# Patient Record
Sex: Male | Born: 1940 | Race: Black or African American | Hispanic: No | Marital: Married | Smoking: Former smoker
Health system: Southern US, Community
[De-identification: ages and names within clinical notes are randomized; demographics above are authoritative.]

## PROBLEM LIST (undated history)

## (undated) DIAGNOSIS — I509 Heart failure, unspecified: Secondary | ICD-10-CM

## (undated) DIAGNOSIS — I2699 Other pulmonary embolism without acute cor pulmonale: Secondary | ICD-10-CM

## (undated) DIAGNOSIS — I2510 Atherosclerotic heart disease of native coronary artery without angina pectoris: Secondary | ICD-10-CM

## (undated) DIAGNOSIS — I10 Essential (primary) hypertension: Secondary | ICD-10-CM

## (undated) DIAGNOSIS — K219 Gastro-esophageal reflux disease without esophagitis: Secondary | ICD-10-CM

## (undated) DIAGNOSIS — E119 Type 2 diabetes mellitus without complications: Secondary | ICD-10-CM

## (undated) DIAGNOSIS — J449 Chronic obstructive pulmonary disease, unspecified: Secondary | ICD-10-CM

## (undated) DIAGNOSIS — C801 Malignant (primary) neoplasm, unspecified: Secondary | ICD-10-CM

## (undated) DIAGNOSIS — M797 Fibromyalgia: Secondary | ICD-10-CM

## (undated) HISTORY — PX: HERNIA REPAIR: SHX51

## (undated) HISTORY — PX: TONSILLECTOMY: SUR1361

## (undated) HISTORY — PX: REPAIR KNEE LIGAMENT: SUR1188

## (undated) HISTORY — PX: EYE SURGERY: SHX253

## (undated) SURGERY — BRONCHOSCOPY, FLEXIBLE
Anesthesia: Moderate Sedation | Laterality: Bilateral

---

## 2004-05-24 ENCOUNTER — Ambulatory Visit: Payer: Self-pay | Admitting: Specialist

## 2004-05-31 ENCOUNTER — Ambulatory Visit: Payer: Self-pay | Admitting: Specialist

## 2004-08-07 ENCOUNTER — Emergency Department: Payer: Self-pay | Admitting: Emergency Medicine

## 2004-09-17 ENCOUNTER — Emergency Department: Payer: Self-pay | Admitting: Unknown Physician Specialty

## 2004-09-19 ENCOUNTER — Emergency Department: Payer: Self-pay | Admitting: Emergency Medicine

## 2004-09-30 ENCOUNTER — Emergency Department: Payer: Self-pay | Admitting: Emergency Medicine

## 2004-10-23 ENCOUNTER — Emergency Department: Payer: Self-pay | Admitting: Emergency Medicine

## 2004-10-28 ENCOUNTER — Ambulatory Visit: Payer: Self-pay | Admitting: Unknown Physician Specialty

## 2005-01-14 ENCOUNTER — Emergency Department: Payer: Self-pay | Admitting: Emergency Medicine

## 2005-04-24 ENCOUNTER — Ambulatory Visit: Payer: Self-pay | Admitting: Family Medicine

## 2005-05-06 ENCOUNTER — Emergency Department: Payer: Self-pay | Admitting: Emergency Medicine

## 2005-12-11 ENCOUNTER — Emergency Department: Payer: Self-pay | Admitting: Emergency Medicine

## 2006-01-20 ENCOUNTER — Ambulatory Visit: Payer: Self-pay | Admitting: Urology

## 2006-09-28 ENCOUNTER — Ambulatory Visit: Payer: Self-pay | Admitting: Family Medicine

## 2007-05-25 ENCOUNTER — Emergency Department: Payer: Self-pay | Admitting: Internal Medicine

## 2007-05-25 ENCOUNTER — Ambulatory Visit: Payer: Self-pay | Admitting: Family Medicine

## 2007-10-22 ENCOUNTER — Ambulatory Visit: Payer: Self-pay | Admitting: Family Medicine

## 2007-11-12 ENCOUNTER — Ambulatory Visit: Payer: Self-pay | Admitting: Family Medicine

## 2008-03-29 ENCOUNTER — Emergency Department: Payer: Self-pay | Admitting: Emergency Medicine

## 2008-03-29 ENCOUNTER — Other Ambulatory Visit: Payer: Self-pay

## 2008-11-12 ENCOUNTER — Emergency Department: Payer: Self-pay | Admitting: Internal Medicine

## 2008-12-14 ENCOUNTER — Ambulatory Visit: Payer: Self-pay | Admitting: Gastroenterology

## 2009-07-05 ENCOUNTER — Ambulatory Visit: Payer: Self-pay | Admitting: Cardiovascular Disease

## 2010-08-29 ENCOUNTER — Ambulatory Visit: Payer: Self-pay | Admitting: Cardiovascular Disease

## 2011-11-13 ENCOUNTER — Ambulatory Visit: Payer: Self-pay | Admitting: Orthopedic Surgery

## 2012-05-06 ENCOUNTER — Ambulatory Visit: Payer: Self-pay | Admitting: Family Medicine

## 2013-02-08 ENCOUNTER — Emergency Department: Payer: Self-pay | Admitting: Emergency Medicine

## 2013-05-26 ENCOUNTER — Ambulatory Visit: Payer: Self-pay | Admitting: Family Medicine

## 2014-01-17 ENCOUNTER — Emergency Department: Payer: Self-pay | Admitting: Emergency Medicine

## 2014-01-17 LAB — BASIC METABOLIC PANEL
Anion Gap: 9 (ref 7–16)
BUN: 18 mg/dL (ref 7–18)
CHLORIDE: 108 mmol/L — AB (ref 98–107)
Calcium, Total: 9.5 mg/dL (ref 8.5–10.1)
Co2: 23 mmol/L (ref 21–32)
Creatinine: 1.44 mg/dL — ABNORMAL HIGH (ref 0.60–1.30)
EGFR (African American): 56 — ABNORMAL LOW
GFR CALC NON AF AMER: 48 — AB
Glucose: 95 mg/dL (ref 65–99)
OSMOLALITY: 281 (ref 275–301)
Potassium: 3.7 mmol/L (ref 3.5–5.1)
SODIUM: 140 mmol/L (ref 136–145)

## 2014-01-17 LAB — TROPONIN I

## 2014-01-17 LAB — CBC
HCT: 41.7 % (ref 40.0–52.0)
HGB: 13.5 g/dL (ref 13.0–18.0)
MCH: 28.7 pg (ref 26.0–34.0)
MCHC: 32.4 g/dL (ref 32.0–36.0)
MCV: 89 fL (ref 80–100)
Platelet: 133 10*3/uL — ABNORMAL LOW (ref 150–440)
RBC: 4.71 10*6/uL (ref 4.40–5.90)
RDW: 15.1 % — ABNORMAL HIGH (ref 11.5–14.5)
WBC: 5.2 10*3/uL (ref 3.8–10.6)

## 2014-10-02 ENCOUNTER — Emergency Department: Payer: Self-pay | Admitting: Emergency Medicine

## 2014-12-04 DIAGNOSIS — R140 Abdominal distension (gaseous): Secondary | ICD-10-CM | POA: Insufficient documentation

## 2014-12-04 DIAGNOSIS — Z86010 Personal history of colonic polyps: Secondary | ICD-10-CM | POA: Insufficient documentation

## 2014-12-04 DIAGNOSIS — IMO0001 Reserved for inherently not codable concepts without codable children: Secondary | ICD-10-CM | POA: Insufficient documentation

## 2015-01-18 ENCOUNTER — Ambulatory Visit
Admission: RE | Admit: 2015-01-18 | Discharge: 2015-01-18 | Disposition: A | Discharge status: Home / Self Care | Payer: Medicare Other | Source: Ambulatory Visit | Attending: Unknown Physician Specialty | Admitting: Unknown Physician Specialty

## 2015-01-18 ENCOUNTER — Encounter
Admission: RE | Disposition: A | Discharge status: Home / Self Care | Payer: Self-pay | Source: Ambulatory Visit | Attending: Unknown Physician Specialty

## 2015-01-18 ENCOUNTER — Ambulatory Visit: Payer: Medicare Other | Admitting: Anesthesiology

## 2015-01-18 DIAGNOSIS — D120 Benign neoplasm of cecum: Secondary | ICD-10-CM | POA: Diagnosis not present

## 2015-01-18 DIAGNOSIS — I509 Heart failure, unspecified: Secondary | ICD-10-CM | POA: Insufficient documentation

## 2015-01-18 DIAGNOSIS — Z1211 Encounter for screening for malignant neoplasm of colon: Secondary | ICD-10-CM | POA: Insufficient documentation

## 2015-01-18 DIAGNOSIS — Z79899 Other long term (current) drug therapy: Secondary | ICD-10-CM | POA: Diagnosis not present

## 2015-01-18 DIAGNOSIS — G709 Myoneural disorder, unspecified: Secondary | ICD-10-CM | POA: Diagnosis not present

## 2015-01-18 DIAGNOSIS — D125 Benign neoplasm of sigmoid colon: Secondary | ICD-10-CM | POA: Insufficient documentation

## 2015-01-18 DIAGNOSIS — Z7982 Long term (current) use of aspirin: Secondary | ICD-10-CM | POA: Insufficient documentation

## 2015-01-18 DIAGNOSIS — I2510 Atherosclerotic heart disease of native coronary artery without angina pectoris: Secondary | ICD-10-CM | POA: Diagnosis not present

## 2015-01-18 DIAGNOSIS — K648 Other hemorrhoids: Secondary | ICD-10-CM | POA: Insufficient documentation

## 2015-01-18 DIAGNOSIS — I8500 Esophageal varices without bleeding: Secondary | ICD-10-CM | POA: Diagnosis not present

## 2015-01-18 DIAGNOSIS — D122 Benign neoplasm of ascending colon: Secondary | ICD-10-CM | POA: Diagnosis not present

## 2015-01-18 DIAGNOSIS — J449 Chronic obstructive pulmonary disease, unspecified: Secondary | ICD-10-CM | POA: Diagnosis not present

## 2015-01-18 DIAGNOSIS — Z86010 Personal history of colonic polyps: Secondary | ICD-10-CM | POA: Diagnosis not present

## 2015-01-18 DIAGNOSIS — D123 Benign neoplasm of transverse colon: Secondary | ICD-10-CM | POA: Diagnosis not present

## 2015-01-18 DIAGNOSIS — M797 Fibromyalgia: Secondary | ICD-10-CM | POA: Insufficient documentation

## 2015-01-18 DIAGNOSIS — D124 Benign neoplasm of descending colon: Secondary | ICD-10-CM | POA: Insufficient documentation

## 2015-01-18 DIAGNOSIS — K2970 Gastritis, unspecified, without bleeding: Secondary | ICD-10-CM | POA: Insufficient documentation

## 2015-01-18 DIAGNOSIS — I10 Essential (primary) hypertension: Secondary | ICD-10-CM | POA: Insufficient documentation

## 2015-01-18 DIAGNOSIS — E119 Type 2 diabetes mellitus without complications: Secondary | ICD-10-CM | POA: Diagnosis not present

## 2015-01-18 DIAGNOSIS — K5730 Diverticulosis of large intestine without perforation or abscess without bleeding: Secondary | ICD-10-CM | POA: Insufficient documentation

## 2015-01-18 DIAGNOSIS — R1084 Generalized abdominal pain: Secondary | ICD-10-CM | POA: Diagnosis present

## 2015-01-18 DIAGNOSIS — Z87891 Personal history of nicotine dependence: Secondary | ICD-10-CM | POA: Diagnosis not present

## 2015-01-18 HISTORY — DX: Essential (primary) hypertension: I10

## 2015-01-18 HISTORY — DX: Atherosclerotic heart disease of native coronary artery without angina pectoris: I25.10

## 2015-01-18 HISTORY — DX: Heart failure, unspecified: I50.9

## 2015-01-18 HISTORY — PX: COLONOSCOPY WITH PROPOFOL: SHX5780

## 2015-01-18 HISTORY — PX: ESOPHAGOGASTRODUODENOSCOPY: SHX5428

## 2015-01-18 HISTORY — DX: Chronic obstructive pulmonary disease, unspecified: J44.9

## 2015-01-18 HISTORY — DX: Fibromyalgia: M79.7

## 2015-01-18 HISTORY — DX: Type 2 diabetes mellitus without complications: E11.9

## 2015-01-18 LAB — GLUCOSE, CAPILLARY: Glucose-Capillary: 109 mg/dL — ABNORMAL HIGH (ref 65–99)

## 2015-01-18 SURGERY — COLONOSCOPY WITH PROPOFOL
Anesthesia: General

## 2015-01-18 MED ORDER — PROPOFOL 10 MG/ML IV BOLUS
INTRAVENOUS | Status: DC | PRN
  Administered 2015-01-18: 50 mg via INTRAVENOUS
  Administered 2015-01-18: 30 mg via INTRAVENOUS

## 2015-01-18 MED ORDER — PROPOFOL INFUSION 10 MG/ML OPTIME
INTRAVENOUS | Status: DC | PRN
  Administered 2015-01-18: 25 ug/kg/min via INTRAVENOUS

## 2015-01-18 MED ORDER — SODIUM CHLORIDE 0.9 % IV SOLN
INTRAVENOUS | Status: DC
  Administered 2015-01-18: 1000 mL via INTRAVENOUS

## 2015-01-18 MED ORDER — ALFENTANIL 500 MCG/ML IJ INJ
INJECTION | INTRAMUSCULAR | Status: DC | PRN
  Administered 2015-01-18: 500 ug via INTRAVENOUS
  Administered 2015-01-18: 12:00:00 via INTRAVENOUS

## 2015-01-18 MED ORDER — EPHEDRINE SULFATE 50 MG/ML IJ SOLN
INTRAMUSCULAR | Status: DC | PRN
Start: 2015-01-18 — End: 2015-01-18
  Administered 2015-01-18 (×3): 5 mg via INTRAVENOUS

## 2015-01-18 MED ORDER — GLYCOPYRROLATE 0.2 MG/ML IJ SOLN
INTRAMUSCULAR | Status: DC | PRN
  Administered 2015-01-18: 0.3 mg via INTRAVENOUS

## 2015-01-18 MED ORDER — SODIUM CHLORIDE 0.9 % IV SOLN
INTRAVENOUS | Status: DC
  Administered 2015-01-18: 12:00:00 via INTRAVENOUS

## 2015-01-18 MED ORDER — PHENYLEPHRINE HCL 10 MG/ML IJ SOLN
INTRAMUSCULAR | Status: DC | PRN
  Administered 2015-01-18 (×7): 100 ug via INTRAVENOUS

## 2015-01-18 MED ORDER — LIDOCAINE HCL (CARDIAC) 20 MG/ML IV SOLN
INTRAVENOUS | Status: DC | PRN
  Administered 2015-01-18: 100 mg via INTRAVENOUS

## 2015-01-18 MED ORDER — FENTANYL CITRATE (PF) 100 MCG/2ML IJ SOLN
INTRAMUSCULAR | Status: DC | PRN
  Administered 2015-01-18: 50 ug via INTRAVENOUS

## 2015-01-18 NOTE — Op Note (Signed)
[facility] Gastroenterology Patient Name: Chris Wilkinson Procedure Date: 01/18/2015 11:50 AM MRN: [id number] Account #: [id number] Date of Birth: 25-Sep-1940 Admit Type: Outpatient Age: 74 Room: [facility] ENDO ROOM 1 Gender: Male Note Status: Finalized Procedure:         Colonoscopy Indications:       Personal history of colonic polyps Providers:         Manya Silvas, MD Referring MD:      Marguerita Merles, MD (Referring MD) Medicines:         Propofol per Anesthesia Complications:     No immediate complications. Procedure:         Pre-Anesthesia Assessment:                    - After reviewing the risks and benefits, the patient was                     deemed in satisfactory condition to undergo the procedure.                    After obtaining informed consent, the colonoscope was                     passed under direct vision. Throughout the procedure, the                     patient's blood pressure, pulse, and oxygen saturations                     were monitored continuously. The Colonoscope was                     introduced through the anus and advanced to the the cecum,                     identified by appendiceal orifice and ileocecal valve. The                     colonoscopy was performed without difficulty. The patient                     tolerated the procedure well. The quality of the bowel                     preparation was good. Findings:      A 9 mm polyp was found in the ascending colon. The polyp was sessile.       The polyp was removed with a hot snare. Resection and retrieval were       complete. To prevent bleeding after the polypectomy, two hemostatic       clips were successfully placed. There was no bleeding during, and at the       end, of the procedure.      A 10 mm polyp was found in the transverse colon. The polyp was sessile.       The polyp was removed with a hot snare. Resection and retrieval were       complete. Two hemostatic  clips were successfully placed. There was no       bleeding during, and at the end, of the procedure.      Two sessile polyps were found in the cecum. The polyps were diminutive       in size. These polyps were removed with a jumbo  cold forceps. Resection       and retrieval were complete.      A diminutive polyp was found in the transverse colon. The polyp was       sessile. The polyp was removed with a jumbo cold forceps. Resection and       retrieval were complete.      A diminutive polyp was found in the descending colon. The polyp was       sessile. The polyp was removed with a jumbo cold forceps. Resection and       retrieval were complete.      A small polyp was found in the sigmoid colon. The polyp was sessile. The       polyp was removed with a hot snare. Resection and retrieval were       complete. One hemostatic clip was successfully placed.      A few small-mouthed diverticula were found in the sigmoid colon.      Internal hemorrhoids were found during endoscopy. The hemorrhoids were       small and Grade I (internal hemorrhoids that do not prolapse). Impression:        - One 9 mm polyp in the ascending colon. Resected and                     retrieved. Clips were placed.                    - One 10 mm polyp in the transverse colon. Resected and                     retrieved. Clips were placed.                    - Two diminutive polyps in the cecum. Resected and                     retrieved.                    - One diminutive polyp in the transverse colon. Resected                     and retrieved.                    - One diminutive polyp in the descending colon. Resected                     and retrieved.                    - One small polyp in the sigmoid colon. Resected and                     retrieved. Clip was placed.                    - Diverticulosis in the sigmoid colon.                    - Internal hemorrhoids. Recommendation:    - Await pathology  results. Manya Silvas, MD 01/18/2015 12:52:42 PM This report has been signed electronically. Number of Addenda: 0 Note Initiated On: 01/18/2015 11:50 AM Scope Withdrawal Time: 0 hours 28 minutes 26 seconds  Total Procedure Duration: 0 hours 36 minutes 53 seconds       [place]  [facility]

## 2015-01-18 NOTE — H&P (Signed)
Primary Care Physician:  Marguerita Merles, MD Primary Gastroenterologist:  Dr. Vira Agar  Pre-Procedure History & Physical: HPI:  Chris Wilkinson is a 74 y.o. male is here for an endoscopy and colonoscopy.   Past Medical History  Diagnosis Date  . Diabetes mellitus without complication   . CHF (congestive heart failure)   . COPD (chronic obstructive pulmonary disease)   . Hypertension   . Coronary artery disease   . Fibromyalgia     Past Surgical History  Procedure Laterality Date  . Tonsillectomy    . Hernia repair    . Eye surgery    . Repair knee ligament      Prior to Admission medications   Medication Sig Start Date End Date Taking? Authorizing Provider  albuterol-ipratropium (COMBIVENT) 18-103 MCG/ACT inhaler Inhale 2 puffs into the lungs every 6 (six) hours as needed for wheezing or shortness of breath.   Yes Historical Provider, MD  aspirin 81 MG tablet Take 81 mg by mouth daily.   Yes Historical Provider, MD  carvedilol (COREG) 25 MG tablet Take 25 mg by mouth 2 (two) times daily with a meal.   Yes Historical Provider, MD  clopidogrel (PLAVIX) 75 MG tablet Take 75 mg by mouth daily.   Yes Historical Provider, MD  Fluticasone Furoate-Vilanterol 100-25 MCG/INH AEPB Inhale into the lungs.   Yes Historical Provider, MD  lisinopril (PRINIVIL,ZESTRIL) 40 MG tablet Take 40 mg by mouth daily.   Yes Historical Provider, MD  metFORMIN (GLUCOPHAGE) 500 MG tablet Take by mouth 2 (two) times daily with a meal.   Yes Historical Provider, MD  ranitidine (ZANTAC) 150 MG tablet Take 150 mg by mouth 2 (two) times daily.   Yes Historical Provider, MD  simvastatin (ZOCOR) 20 MG tablet Take 20 mg by mouth daily.   Yes Historical Provider, MD  simvastatin (ZOCOR) 40 MG tablet Take 40 mg by mouth daily.   Yes Historical Provider, MD    Allergies as of 12/04/2014  . (Not on File)    History reviewed. No pertinent family history.  History   Social History  . Marital Status: Married   Spouse Name: N/A  . Number of Children: N/A  . Years of Education: N/A   Occupational History  . Not on file.   Social History Main Topics  . Smoking status: Former Smoker    Quit date: 12/01/2003  . Smokeless tobacco: Not on file  . Alcohol Use: Not on file  . Drug Use: Not on file  . Sexual Activity: Not on file   Other Topics Concern  . Not on file   Social History Narrative  . No narrative on file    Review of Systems: See HPI, otherwise negative ROS  Physical Exam: BP 137/75 mmHg  Pulse 68  Temp(Src) 98.9 F (37.2 C) (Tympanic)  Resp 17  Ht '5\' 11"'$  (1.803 m)  Wt 95.709 kg (211 lb)  BMI 29.44 kg/m2  SpO2 97% General:   Alert,  pleasant and cooperative in NAD Head:  Normocephalic and atraumatic. Neck:  Supple; no masses or thyromegaly. Lungs:  Clear throughout to auscultation.    Heart:  Regular rate and rhythm. Abdomen:  Soft, nontender and nondistended. Normal bowel sounds, without guarding, and without rebound.   Neurologic:  Alert and  oriented x4;  grossly normal neurologically.  Impression/Plan: Wynelle Cleveland is here for an endoscopy and colonoscopy to be performed for personal history of colon polyps and for abd pain due to bloating and  gas.  Risks, benefits, limitations, and alternatives regarding  endoscopy and colonoscopy have been reviewed with the patient.  Questions have been answered.  All parties agreeable.   Gaylyn Cheers, MD  01/18/2015, 11:41 AM   Primary Care Physician:  Marguerita Merles, MD Primary Gastroenterologist:  Dr. Vira Agar  Pre-Procedure History & Physical: HPI:  Chris Wilkinson is a 74 y.o. male is here for an endoscopy and colonoscopy.   Past Medical History  Diagnosis Date  . Diabetes mellitus without complication   . CHF (congestive heart failure)   . COPD (chronic obstructive pulmonary disease)   . Hypertension   . Coronary artery disease   . Fibromyalgia     Past Surgical History  Procedure Laterality Date  .  Tonsillectomy    . Hernia repair    . Eye surgery    . Repair knee ligament      Prior to Admission medications   Medication Sig Start Date End Date Taking? Authorizing Provider  albuterol-ipratropium (COMBIVENT) 18-103 MCG/ACT inhaler Inhale 2 puffs into the lungs every 6 (six) hours as needed for wheezing or shortness of breath.   Yes Historical Provider, MD  aspirin 81 MG tablet Take 81 mg by mouth daily.   Yes Historical Provider, MD  carvedilol (COREG) 25 MG tablet Take 25 mg by mouth 2 (two) times daily with a meal.   Yes Historical Provider, MD  clopidogrel (PLAVIX) 75 MG tablet Take 75 mg by mouth daily.   Yes Historical Provider, MD  Fluticasone Furoate-Vilanterol 100-25 MCG/INH AEPB Inhale into the lungs.   Yes Historical Provider, MD  lisinopril (PRINIVIL,ZESTRIL) 40 MG tablet Take 40 mg by mouth daily.   Yes Historical Provider, MD  metFORMIN (GLUCOPHAGE) 500 MG tablet Take by mouth 2 (two) times daily with a meal.   Yes Historical Provider, MD  ranitidine (ZANTAC) 150 MG tablet Take 150 mg by mouth 2 (two) times daily.   Yes Historical Provider, MD  simvastatin (ZOCOR) 20 MG tablet Take 20 mg by mouth daily.   Yes Historical Provider, MD  simvastatin (ZOCOR) 40 MG tablet Take 40 mg by mouth daily.   Yes Historical Provider, MD    Allergies as of 12/04/2014  . (Not on File)    History reviewed. No pertinent family history.  History   Social History  . Marital Status: Married    Spouse Name: N/A  . Number of Children: N/A  . Years of Education: N/A   Occupational History  . Not on file.   Social History Main Topics  . Smoking status: Former Smoker    Quit date: 12/01/2003  . Smokeless tobacco: Not on file  . Alcohol Use: Not on file  . Drug Use: Not on file  . Sexual Activity: Not on file   Other Topics Concern  . Not on file   Social History Narrative  . No narrative on file    Review of Systems: See HPI, otherwise negative ROS  Physical Exam: BP  137/75 mmHg  Pulse 68  Temp(Src) 98.9 F (37.2 C) (Tympanic)  Resp 17  Ht '5\' 11"'$  (1.803 m)  Wt 95.709 kg (211 lb)  BMI 29.44 kg/m2  SpO2 97% General:   Alert,  pleasant and cooperative in NAD Head:  Normocephalic and atraumatic. Neck:  Supple; no masses or thyromegaly. Lungs:  Clear throughout to auscultation.    Heart:  Regular rate and rhythm. Abdomen:  Soft, nontender and nondistended. Normal bowel sounds, without guarding, and without rebound.  Neurologic:  Alert and  oriented x4;  grossly normal neurologically.  Impression/Plan: Wynelle Cleveland is here for an endoscopy and colonoscopy to be performed for personal history of colon polyps and abd pain from gas and bloating.  Risks, benefits, limitations, and alternatives regarding  endoscopy and colonoscopy have been reviewed with the patient.  Questions have been answered.  All parties agreeable.   Gaylyn Cheers, MD  01/18/2015, 11:41 AM

## 2015-01-18 NOTE — Op Note (Signed)
[facility] Gastroenterology Patient Name: Chris Wilkinson Procedure Date: 01/18/2015 11:51 AM MRN: [id number] Account #: [id number] Date of Birth: October 18, 1940 Admit Type: Outpatient Age: 74 Room: [facility] ENDO ROOM 1 Gender: Male Note Status: Finalized Procedure:         Upper GI endoscopy Indications:       Generalized abdominal pain Providers:         Manya Silvas, MD Referring MD:      Marguerita Merles, MD (Referring MD) Medicines:         Propofol per Anesthesia Complications:     No immediate complications. Procedure:         Pre-Anesthesia Assessment:                    - After reviewing the risks and benefits, the patient was                     deemed in satisfactory condition to undergo the procedure.                    After obtaining informed consent, the endoscope was passed                     under direct vision. Throughout the procedure, the                     patient's blood pressure, pulse, and oxygen saturations                     were monitored continuously. The Endoscope was introduced                     through the mouth, and advanced to the second part of                     duodenum. The upper GI endoscopy was accomplished without                     difficulty. The patient tolerated the procedure well. Findings:      Grade I varices were found in the upper third of the esophagus, in the       middle third of the esophagus and in the lower third of the esophagus.      Diffuse, scattered and patchy moderate inflammation characterized by       erythema and granularity was found in the gastric body. Biopsies were       taken with a cold forceps for histology. Biopsies were taken with a cold       forceps for Helicobacter pylori testing.      Patchy mild inflammation characterized by erythema and granularity was       found in the duodenal bulb.      The 2nd part of the duodenum was normal. Impression:        - Grade I esophageal varices.                - Gastritis. Biopsied.                    - Duodenitis.                    - Normal 2nd part of the duodenum. Recommendation:    - Await pathology results.                    -  Perform a colonoscopy as previously scheduled. Manya Silvas, MD 01/18/2015 12:08:14 PM This report has been signed electronically. Number of Addenda: 0 Note Initiated On: 01/18/2015 11:51 AM      [facility]

## 2015-01-18 NOTE — Anesthesia Postprocedure Evaluation (Signed)
  Anesthesia Post-op Note  Patient: Chris Wilkinson  Procedure(s) Performed: Procedure(s): COLONOSCOPY WITH PROPOFOL (N/A) ESOPHAGOGASTRODUODENOSCOPY (EGD) (N/A)  Anesthesia type:General  Patient location: PACU  Post pain: Pain level controlled  Post assessment: Post-op Vital signs reviewed, Patient's Cardiovascular Status Stable, Respiratory Function Stable, Patent Airway and No signs of Nausea or vomiting  Post vital signs: Reviewed and stable  Last Vitals:  Filed Vitals:   01/18/15 1310  BP: 124/82  Pulse: 80  Temp:   Resp: 12    Level of consciousness: awake, alert  and patient cooperative  Complications: No apparent anesthesia complications

## 2015-01-18 NOTE — Anesthesia Preprocedure Evaluation (Addendum)
Anesthesia Evaluation  Patient identified by MRN, date of birth, ID band Patient awake    Reviewed: Allergy & Precautions, H&P , NPO status , Patient's Chart, lab work & pertinent test results, reviewed documented beta blocker date and time   History of Anesthesia Complications Negative for: history of anesthetic complications  Airway Mallampati: III  TM Distance: >3 FB Neck ROM: full    Dental  (+) Upper Dentures, Lower Dentures   Pulmonary shortness of breath, COPDformer smoker,  breath sounds clear to auscultation  Pulmonary exam normal       Cardiovascular Exercise Tolerance: Poor hypertension, + CAD and +CHF Normal cardiovascular examRhythm:regular Rate:Normal     Neuro/Psych  Neuromuscular disease negative psych ROS   GI/Hepatic negative GI ROS, Neg liver ROS,   Endo/Other  negative endocrine ROSdiabetes  Renal/GU negative Renal ROS  negative genitourinary   Musculoskeletal negative musculoskeletal ROS (+)   Abdominal   Peds negative pediatric ROS (+)  Hematology negative hematology ROS (+)   Anesthesia Other Findings   Reproductive/Obstetrics negative OB ROS                            Anesthesia Physical Anesthesia Plan  ASA: III  Anesthesia Plan: General   Post-op Pain Management:    Induction:   Airway Management Planned:   Additional Equipment:   Intra-op Plan:   Post-operative Plan:   Informed Consent: I have reviewed the patients History and Physical, chart, labs and discussed the procedure including the risks, benefits and alternatives for the proposed anesthesia with the patient or authorized representative who has indicated his/her understanding and acceptance.   Dental Advisory Given  Plan Discussed with: Anesthesiologist, CRNA and Surgeon  Anesthesia Plan Comments:        Anesthesia Quick Evaluation

## 2015-01-18 NOTE — Transfer of Care (Signed)
Immediate Anesthesia Transfer of Care Note  Patient: Chris Wilkinson  Procedure(s) Performed: Procedure(s): COLONOSCOPY WITH PROPOFOL (N/A) ESOPHAGOGASTRODUODENOSCOPY (EGD) (N/A)  Patient Location: PACU  Anesthesia Type:General  Level of Consciousness: sedated  Airway & Oxygen Therapy: Patient Spontanous Breathing and Patient connected to nasal cannula oxygen  Post-op Assessment: Report given to RN and Post -op Vital signs reviewed and stable  Post vital signs: Reviewed and stable  Last Vitals:  Filed Vitals:   01/18/15 1257  BP: 116/73  Pulse:   Temp: 36.3 C  Resp: 19    Complications: No apparent anesthesia complications

## 2015-01-19 ENCOUNTER — Encounter: Payer: Self-pay | Admitting: Unknown Physician Specialty

## 2015-01-19 LAB — SURGICAL PATHOLOGY

## 2015-03-27 ENCOUNTER — Emergency Department
Admission: EM | Admit: 2015-03-27 | Discharge: 2015-03-27 | Disposition: A | Discharge status: Home / Self Care | Payer: Medicare Other | Attending: Emergency Medicine | Admitting: Emergency Medicine

## 2015-03-27 ENCOUNTER — Encounter: Payer: Self-pay | Admitting: Emergency Medicine

## 2015-03-27 ENCOUNTER — Emergency Department: Payer: Medicare Other

## 2015-03-27 DIAGNOSIS — M65221 Calcific tendinitis, right upper arm: Secondary | ICD-10-CM | POA: Diagnosis not present

## 2015-03-27 DIAGNOSIS — I10 Essential (primary) hypertension: Secondary | ICD-10-CM | POA: Diagnosis not present

## 2015-03-27 DIAGNOSIS — E119 Type 2 diabetes mellitus without complications: Secondary | ICD-10-CM | POA: Diagnosis not present

## 2015-03-27 DIAGNOSIS — Z87891 Personal history of nicotine dependence: Secondary | ICD-10-CM | POA: Diagnosis not present

## 2015-03-27 DIAGNOSIS — M25511 Pain in right shoulder: Secondary | ICD-10-CM | POA: Diagnosis present

## 2015-03-27 DIAGNOSIS — M19011 Primary osteoarthritis, right shoulder: Secondary | ICD-10-CM | POA: Insufficient documentation

## 2015-03-27 DIAGNOSIS — M6520 Calcific tendinitis, unspecified site: Secondary | ICD-10-CM

## 2015-03-27 MED ORDER — OXYCODONE-ACETAMINOPHEN 5-325 MG PO TABS: 1.0000 | ORAL_TABLET | Freq: Four times a day (QID) | ORAL | Status: DC | PRN

## 2015-03-27 NOTE — Discharge Instructions (Signed)
Calcific Tendinitis Calcific tendinitis occurs when crystals of calcium are deposited in a tendon. Tendons are bands of strong, fibrous tissue that attach muscles to bones. Tendons are an important part of joints. They make the joint move and they absorb some of the stress that a joint receives during use. When calcium is deposited in the tendon, the tendon becomes stiff, painful, and it can become swollen. Calcific tendinitis occurs frequently in the shoulder joint, in a structure called the rotator cuff. CAUSES  The cause of calcific tendinitis is unclear. It may be associated with:  Overuse of the tendon, such as from repetitive motion.  Excess stress on the tendon.  Aging.  Repetitive, mild injuries. SYMPTOMS   Pain may or may not be present. If it is present, it may occur when moving the joint.  Tenderness when pressure is applied to the tendon.  A snapping or popping sound when the joint moves.  Decreased motion of the joint.  Difficulty sleeping due to pain in the joint. DIAGNOSIS  Your health care provider will perform a physical exam. Imaging tests may also be used to make the diagnosis. These may include X-rays, an MRI, or a CT scan. TREATMENT  Generally, calcific tendinitis resolves on its own. Treatment for pain of calcific tendinitis may include:  Taking over-the-counter medicines, such as anti-inflammatory drugs.  Applying ice packs to the joint.  Following a specific exercise program to keep the joint working properly.  Attending physical therapy sessions.  Avoiding activities that cause pain. Treatment for more severe calcific tendinitis may require:  Injecting cortisone steroids or pain relieving medicines into or around the joint.  Manipulating the joint after you are given medicine to numb the area (local anesthetic).  Inflating the joint with sterile fluid to increase the flexibility of the tendons.  Shockwave therapy, which involves focusing sound  waves on the joint. If other treatments do not work, surgery may be done to clean out the calcium deposits and repair the tendons where needed. Most people do not need surgery. HOME CARE INSTRUCTIONS   Only take over-the-counter or prescription medicines for pain, fever, or discomfort as directed by your health care provider.  Follow your health care provider's recommendations for activity and exercise. SEEK MEDICAL CARE IF:  You notice an increase in pain or numbness.  You develop new weakness.  You notice increased joint stiffness or a sensation of looseness in the joint.  You notice increasing redness, swelling, or warmth around the joint area. SEEK IMMEDIATE MEDICAL CARE IF:  You have a fever or persistent symptoms for more than 2 to 3 days.  You have a fever and your symptoms suddenly get worse. MAKE SURE YOU:  Understand these instructions.  Will watch your condition. Osteoarthritis Osteoarthritis is a disease that causes soreness and inflammation of a joint. It occurs when the cartilage at the affected joint wears down. Cartilage acts as a cushion, covering the ends of bones where they meet to form a joint. Osteoarthritis is the most common form of arthritis. It often occurs in older people. The joints affected most often by this condition include those in the: Ends of the fingers. Thumbs. Neck. Lower back. Knees. Hips. CAUSES  Over time, the cartilage that covers the ends of bones begins to wear away. This causes bone to rub on bone, producing pain and stiffness in the affected joints.  RISK FACTORS Certain factors can increase your chances of having osteoarthritis, including: Older age. Excessive body weight. Overuse of joints.  Previous joint injury. SIGNS AND SYMPTOMS  Pain, swelling, and stiffness in the joint. Over time, the joint may lose its normal shape. Small deposits of bone (osteophytes) may grow on the edges of the joint. Bits of bone or cartilage can  break off and float inside the joint space. This may cause more pain and damage. DIAGNOSIS  Your health care provider will do a physical exam and ask about your symptoms. Various tests may be ordered, such as: X-rays of the affected joint. An MRI scan. Blood tests to rule out other types of arthritis. Joint fluid tests. This involves using a needle to draw fluid from the joint and examining the fluid under a microscope. TREATMENT  Goals of treatment are to control pain and improve joint function. Treatment plans may include: A prescribed exercise program that allows for rest and joint relief. A weight control plan. Pain relief techniques, such as: Properly applied heat and cold. Electric pulses delivered to nerve endings under the skin (transcutaneous electrical nerve stimulation [TENS]). Massage. Certain nutritional supplements. Medicines to control pain, such as: Acetaminophen. Nonsteroidal anti-inflammatory drugs (NSAIDs), such as naproxen. Narcotic or central-acting agents, such as tramadol. Corticosteroids. These can be given orally or as an injection. Surgery to reposition the bones and relieve pain (osteotomy) or to remove loose pieces of bone and cartilage. Joint replacement may be needed in advanced states of osteoarthritis. HOME CARE INSTRUCTIONS  Take medicines only as directed by your health care provider. Maintain a healthy weight. Follow your health care provider's instructions for weight control. This may include dietary instructions. Exercise as directed. Your health care provider can recommend specific types of exercise. These may include: Strengthening exercises. These are done to strengthen the muscles that support joints affected by arthritis. They can be performed with weights or with exercise bands to add resistance. Aerobic activities. These are exercises, such as brisk walking or low-impact aerobics, that get your heart pumping. Range-of-motion activities. These  keep your joints limber. Balance and agility exercises. These help you maintain daily living skills. Rest your affected joints as directed by your health care provider. Keep all follow-up visits as directed by your health care provider. SEEK MEDICAL CARE IF:  Your skin turns red. You develop a rash in addition to your joint pain. You have worsening joint pain. You have a fever along with joint or muscle aches. SEEK IMMEDIATE MEDICAL CARE IF: You have a significant loss of weight or appetite. You have night sweats. [place] of Arthritis and Musculoskeletal and Skin Diseases: www.niams.SouthExposed.es [employer] on Aging: http://kim-miller.com/ American College of Rheumatology: www.rheumatology.org Document Released: 06/30/2005 Document Revised: 11/14/2013 Document Reviewed: 03/07/2013 [facility] Patient Information 2015 [place], [place]. This information is not intended to replace advice given to you by your health care provider. Make sure you discuss any questions you have with your health care provider.   Will get help right away if you are not doing well or get worse. Document Released: 04/08/2008 Document Revised: 11/14/2013 Document Reviewed: 10/09/2011 [facility] Patient Information 2015 [place], [place]. This information is not intended to replace advice given to you by your health care provider. Make sure you discuss any questions you have with your health care provider.

## 2015-03-27 NOTE — ED Provider Notes (Addendum)
[facility] Emergency Department Provider Note     Time seen: ----------------------------------------- 5:33 PM on 03/27/2015 -----------------------------------------    I have reviewed the triage vital signs and the nursing notes.   HISTORY  Chief Complaint Shoulder Pain    HPI Chris Wilkinson is a 74 y.o. male who presents to ER for right shoulder pain that started about 4 days ago. Patient states pain radiates to the right side of his neck, denies any recent falls or trauma, denies any recent heavy lifting or injury. Patient states movement of the shoulder makes it worse.   Past Medical History  Diagnosis Date  . Diabetes mellitus without complication   . CHF (congestive heart failure)   . COPD (chronic obstructive pulmonary disease)   . Hypertension   . Coronary artery disease   . Fibromyalgia     There are no active problems to display for this patient.   Past Surgical History  Procedure Laterality Date  . Tonsillectomy    . Hernia repair    . Eye surgery    . Repair knee ligament    . Colonoscopy with propofol N/A 01/18/2015    Procedure: COLONOSCOPY WITH PROPOFOL;  Surgeon: Manya Silvas, MD;  Location: [facility] ENDOSCOPY;  Service: Endoscopy;  Laterality: N/A;  . Esophagogastroduodenoscopy N/A 01/18/2015    Procedure: ESOPHAGOGASTRODUODENOSCOPY (EGD);  Surgeon: Manya Silvas, MD;  Location: [facility] ENDOSCOPY;  Service: Endoscopy;  Laterality: N/A;    Allergies Review of patient's allergies indicates no known allergies.  Social History Social History  Substance Use Topics  . Smoking status: Former Smoker    Quit date: 12/01/2003  . Smokeless tobacco: None  . Alcohol Use: Yes    Review of Systems Constitutional: Negative for fever. Musculoskeletal: Positive for right shoulder pain Skin: Negative for rash. Neurological: Negative for headaches, focal weakness or numbness, particularly in the right upper  extremity  ____________________________________________   PHYSICAL EXAM:  VITAL SIGNS: ED Triage Vitals  Enc Vitals Group     BP 03/27/15 1702 141/73 mmHg     Pulse Rate 03/27/15 1702 80     Resp 03/27/15 1702 20     Temp 03/27/15 1702 98.2 F (36.8 C)     Temp Source 03/27/15 1702 Oral     SpO2 03/27/15 1702 93 %     Weight --      Height --      Head Cir --      Peak Flow --      Pain Score 03/27/15 1703 6     Pain Loc --      Pain Edu? --      Excl. in [place]? --     Constitutional: Alert and oriented. Well appearing and in no distress. Musculoskeletal: Pain elicited with range of motion right shoulder. Palpable crepitus with range of motion the right shoulder. There is trapezius muscle spasm on the right. Neurologic:  Normal speech and language. No gross focal neurologic deficits are appreciated. Speech is normal. No gait instability. Normal strength in the right upper extremity Skin:  Skin is warm, dry and intact. No rash noted. ____________________________________________  ED COURSE:  Pertinent labs & imaging results that were available during my care of the patient were reviewed by me and considered in my medical decision making (see chart for details). Patient likely with degenerative changes in the right shoulder. We'll obtain right shoulder x-rays ____________________________________________   RADIOLOGY  Right shoulder x-ray revealed degenerative changes IMPRESSION: No acute osseous  abnormality.  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  Osteoarthritis. ____________________________________________  FINAL ASSESSMENT AND PLAN  Osteoarthritis, calcific tendinitis  Plan: Patient with labs and imaging as dictated above. Patient be discharged with pain medication, advised stretching exercises and physical therapy. He is stable for outpatient follow-up   Earleen Newport, MD   Earleen Newport, MD 03/27/15 1735  Earleen Newport,  MD 03/27/15 [phone]

## 2015-03-27 NOTE — ED Notes (Signed)
Pt to ed with c/o right shoulder pain that started about 4 days ago, states pain radiates into right side of neck, denies injury.

## 2015-05-03 ENCOUNTER — Emergency Department
Admission: EM | Admit: 2015-05-03 | Discharge: 2015-05-03 | Disposition: A | Discharge status: Home / Self Care | Payer: Medicare Other | Attending: Emergency Medicine | Admitting: Emergency Medicine

## 2015-05-03 ENCOUNTER — Encounter: Payer: Self-pay | Admitting: Emergency Medicine

## 2015-05-03 ENCOUNTER — Emergency Department: Payer: Medicare Other

## 2015-05-03 DIAGNOSIS — Z79899 Other long term (current) drug therapy: Secondary | ICD-10-CM | POA: Diagnosis not present

## 2015-05-03 DIAGNOSIS — Z7951 Long term (current) use of inhaled steroids: Secondary | ICD-10-CM | POA: Diagnosis not present

## 2015-05-03 DIAGNOSIS — M546 Pain in thoracic spine: Secondary | ICD-10-CM | POA: Insufficient documentation

## 2015-05-03 DIAGNOSIS — Z87891 Personal history of nicotine dependence: Secondary | ICD-10-CM | POA: Diagnosis not present

## 2015-05-03 DIAGNOSIS — G8929 Other chronic pain: Secondary | ICD-10-CM | POA: Diagnosis not present

## 2015-05-03 DIAGNOSIS — I10 Essential (primary) hypertension: Secondary | ICD-10-CM | POA: Diagnosis not present

## 2015-05-03 DIAGNOSIS — E119 Type 2 diabetes mellitus without complications: Secondary | ICD-10-CM | POA: Insufficient documentation

## 2015-05-03 DIAGNOSIS — Z7982 Long term (current) use of aspirin: Secondary | ICD-10-CM | POA: Diagnosis not present

## 2015-05-03 LAB — URINALYSIS COMPLETE WITH MICROSCOPIC (ARMC ONLY)
BACTERIA UA: NONE SEEN
BILIRUBIN URINE: NEGATIVE
Glucose, UA: >500 mg/dL — AB
HGB URINE DIPSTICK: NEGATIVE
KETONES UR: NEGATIVE mg/dL
Leukocytes, UA: NEGATIVE
NITRITE: NEGATIVE
PH: 6 (ref 5.0–8.0)
PROTEIN: NEGATIVE mg/dL
SPECIFIC GRAVITY, URINE: 1.016 (ref 1.005–1.030)
Squamous Epithelial / LPF: NONE SEEN

## 2015-05-03 MED ORDER — DIAZEPAM 2 MG PO TABS: 1.0000 mg | ORAL_TABLET | Freq: Three times a day (TID) | ORAL | Status: DC | PRN

## 2015-05-03 NOTE — ED Notes (Signed)
Pt to ed with c/o right flank pain intermittently x 1 year.  Pt states when he tries to reach back pain increases. States "My wife came up here today so since she was checking in I felt like I should get this looked at."  Denies pain or trouble with urination.

## 2015-05-03 NOTE — Discharge Instructions (Signed)
Back Pain, Adult °Back pain is very common in adults. The cause of back pain is rarely dangerous and the pain often gets better over time. The cause of your back pain may not be known. Some common causes of back pain include: °· Strain of the muscles or ligaments supporting the spine. °· Wear and tear (degeneration) of the spinal disks. °· Arthritis. °· Direct injury to the back. °For many people, back pain may return. Since back pain is rarely dangerous, most people can learn to manage this condition on their own. °HOME CARE INSTRUCTIONS °Watch your back pain for any changes. The following actions may help to lessen any discomfort you are feeling: °· Remain active. It is stressful on your back to sit or stand in one place for long periods of time. Do not sit, drive, or stand in one place for more than 30 minutes at a time. Take short walks on even surfaces as soon as you are able. Try to increase the length of time you walk each day. °· Exercise regularly as directed by your health care provider. Exercise helps your back heal faster. It also helps avoid future injury by keeping your muscles strong and flexible. °· Do not stay in bed. Resting more than 1-2 days can delay your recovery. °· Pay attention to your body when you bend and lift. The most comfortable positions are those that put less stress on your recovering back. Always use proper lifting techniques, including: °· Bending your knees. °· Keeping the load close to your body. °· Avoiding twisting. °· Find a comfortable position to sleep. Use a firm mattress and lie on your side with your knees slightly bent. If you lie on your back, put a pillow under your knees. °· Avoid feeling anxious or stressed. Stress increases muscle tension and can worsen back pain. It is important to recognize when you are anxious or stressed and learn ways to manage it, such as with exercise. °· Take medicines only as directed by your health care provider. Over-the-counter  medicines to reduce pain and inflammation are often the most helpful. Your health care provider may prescribe muscle relaxant drugs. These medicines help dull your pain so you can more quickly return to your normal activities and healthy exercise. °· Apply ice to the injured area: °· Put ice in a plastic bag. °· Place a towel between your skin and the bag. °· Leave the ice on for 20 minutes, 2-3 times a day for the first 2-3 days. After that, ice and heat may be alternated to reduce pain and spasms. °· Maintain a healthy weight. Excess weight puts extra stress on your back and makes it difficult to maintain good posture. °SEEK MEDICAL CARE IF: °· You have pain that is not relieved with rest or medicine. °· You have increasing pain going down into the legs or buttocks. °· You have pain that does not improve in one week. °· You have night pain. °· You lose weight. °· You have a fever or chills. °SEEK IMMEDIATE MEDICAL CARE IF:  °· You develop new bowel or bladder control problems. °· You have unusual weakness or numbness in your arms or legs. °· You develop nausea or vomiting. °· You develop abdominal pain. °· You feel faint. °  °This information is not intended to replace advice given to you by your health care provider. Make sure you discuss any questions you have with your health care provider. °  °Document Released: 06/30/2005 Document Revised: 07/21/2014 Document Reviewed: 11/01/2013 °Elsevier Interactive Patient Education ©2016 Else[employer]   Inc[employer]  Thoracic Strain A thoracic strain, which is sometimes called a mid-back strain, is an injury to the muscles or tendons that attach to the upper part of your back behind your chest. This type of injury occurs when a muscle is overstretched or overloaded.  Thoracic strains can range from mild to severe. Mild strains may involve stretching a muscle or tendon without tearing it. These injuries may heal in 1-2 weeks. More severe strains involve tearing of muscle fibers or  tendons. These will cause more pain and may take 6-8 weeks to heal. CAUSES This condition may be caused by:  An injury in which a sudden force is placed on the muscle.  Exercising without properly warming up.  Overuse of the muscle.  Improper form during certain movements.  Other injuries that surround or cause stress on the mid-back, causing a strain on the muscles. In some cases, the cause may not be known. RISK FACTORS This injury is more common in:  Athletes.  People with obesity. SYMPTOMS The main symptom of this condition is pain, especially with movement. Other symptoms include:  Bruising.  Swelling.  Spasm. DIAGNOSIS This condition may be diagnosed with a physical exam. X-rays may be taken to check for a fracture. TREATMENT This condition may be treated with:  Resting and icing the injured area.  Physical therapy. This will involve doing stretching and strengthening exercises.  Medicines for pain and inflammation. HOME CARE INSTRUCTIONS  Rest as needed. Follow instructions from your health care provider about any restrictions on activity.  If directed, apply ice to the injured area:  Put ice in a plastic bag.  Place a towel between your skin and the bag.  Leave the ice on for 20 minutes, 2-3 times per day.  Take over-the-counter and prescription medicines only as told by your health care provider.  Begin doing exercises as told by your health care provider or physical therapist.  Always warm up properly before physical activity or sports.  Bend your knees before you lift heavy objects.  Keep all follow-up visits as told by your health care provider. This is important. SEEK MEDICAL CARE IF:  Your pain is not helped by medicine.  Your pain, bruising, or swelling is getting worse.  You have a fever. SEEK IMMEDIATE MEDICAL CARE IF:  You have shortness of breath.  You have chest pain.  You develop numbness or weakness in your legs.  You  have involuntary loss of urine (urinary incontinence).   This information is not intended to replace advice given to you by your health care provider. Make sure you discuss any questions you have with your health care provider.   Document Released: 09/20/2003 Document Revised: 03/21/2015 Document Reviewed: 08/24/2014 Elsevier Interactive Patient Education 2016 [place] the prescription muscle relaxant as needed for muscle pain & spasm.  Follow-up with Dr. Lennox Grumbles as needed.

## 2015-05-03 NOTE — ED Provider Notes (Signed)
[facility] Emergency Department Provider Note ____________________________________________  Time seen: 1230  I have reviewed the triage vital signs and the nursing notes.  HISTORY  Chief Complaint  Flank Pain   HPI Chris Wilkinson is a 74 y.o. male reports to the ED for evaluation and management of some chronic, intermittent right upper back pain over the last 1-2 years. He describes the pain is only muscle type pain when he attempts to reach around with the right arm as if he were washing his back or wiping his bottom. He also notes the discomfort when he twists his torso from left to right. He denies any pain at rest. He notes an increase in the incidence in occurrence over the last 6 months by his recollection. He denies any interim shortness of breath, cough, chest pain, illness, or disability. He has not mentioned this pain or discomfort to his primary care provider over the years. It is not been enough discomfort to warrant him to take any over-the-counter medications.He presents today, simply because his wife is here for an appointment, and decided to check in. He rates his intermittent pain at a 4/10 in triage.  Past Medical History  Diagnosis Date  . Diabetes mellitus without complication ([place])   . CHF (congestive heart failure) ([place])   . COPD (chronic obstructive pulmonary disease) ([place])   . Hypertension   . Coronary artery disease   . Fibromyalgia     There are no active problems to display for this patient.   Past Surgical History  Procedure Laterality Date  . Tonsillectomy    . Hernia repair    . Eye surgery    . Repair knee ligament    . Colonoscopy with propofol N/A 01/18/2015    Procedure: COLONOSCOPY WITH PROPOFOL;  Surgeon: Manya Silvas, MD;  Location: [facility] ENDOSCOPY;  Service: Endoscopy;  Laterality: N/A;  . Esophagogastroduodenoscopy N/A 01/18/2015    Procedure: ESOPHAGOGASTRODUODENOSCOPY (EGD);  Surgeon: Manya Silvas, MD;   Location: [facility] ENDOSCOPY;  Service: Endoscopy;  Laterality: N/A;    Current Outpatient Rx  Name  Route  Sig  Dispense  Refill  . aspirin 325 MG tablet   Oral   Take 325 mg by mouth daily.         Marland Kitchen albuterol-ipratropium (COMBIVENT) 18-103 MCG/ACT inhaler   Inhalation   Inhale 2 puffs into the lungs every 6 (six) hours as needed for wheezing or shortness of breath.         Marland Kitchen aspirin 81 MG tablet   Oral   Take 81 mg by mouth daily.         . carvedilol (COREG) 25 MG tablet   Oral   Take 25 mg by mouth 2 (two) times daily with a meal.         . clopidogrel (PLAVIX) 75 MG tablet   Oral   Take 75 mg by mouth daily.         . diazepam (VALIUM) 2 MG tablet   Oral   Take 0.5 tablets (1 mg total) by mouth every 8 (eight) hours as needed for muscle spasms.   10 tablet   0   . Fluticasone Furoate-Vilanterol 100-25 MCG/INH AEPB   Inhalation   Inhale into the lungs.         Marland Kitchen lisinopril (PRINIVIL,ZESTRIL) 40 MG tablet   Oral   Take 40 mg by mouth daily.         . metFORMIN (GLUCOPHAGE) 500 MG tablet  Oral   Take by mouth 2 (two) times daily with a meal.         . oxyCODONE-acetaminophen (ROXICET) 5-325 MG per tablet   Oral   Take 1 tablet by mouth every 6 (six) hours as needed.   20 tablet   0   . ranitidine (ZANTAC) 150 MG tablet   Oral   Take 150 mg by mouth 2 (two) times daily.         . simvastatin (ZOCOR) 20 MG tablet   Oral   Take 20 mg by mouth daily.         . simvastatin (ZOCOR) 40 MG tablet   Oral   Take 40 mg by mouth daily.          Allergies Review of patient's allergies indicates no known allergies.  History reviewed. No pertinent family history.  Social History Social History  Substance Use Topics  . Smoking status: Former Smoker    Quit date: 12/01/2003  . Smokeless tobacco: None  . Alcohol Use: Yes   Review of Systems  Constitutional: Negative for fever. Eyes: Negative for visual changes. ENT: Negative for sore  throat. Cardiovascular: Negative for chest pain. Respiratory: Negative for shortness of breath. Gastrointestinal: Negative for abdominal pain, vomiting and diarrhea. Genitourinary: Negative for dysuria. Musculoskeletal: Positive for upper back pain. Skin: Negative for rash. Neurological: Negative for headaches, focal weakness or numbness. ____________________________________________  PHYSICAL EXAM:  VITAL SIGNS: ED Triage Vitals  Enc Vitals Group     BP 05/03/15 1104 135/64 mmHg     Pulse Rate 05/03/15 1104 79     Resp 05/03/15 1104 20     Temp 05/03/15 1104 98.2 F (36.8 C)     Temp Source 05/03/15 1104 Oral     SpO2 05/03/15 1104 100 %     Weight 05/03/15 1104 220 lb (99.791 kg)     Height 05/03/15 1104 '5\' 11"'$  (1.803 m)     Head Cir --      Peak Flow --      Pain Score 05/03/15 1104 4     Pain Loc --      Pain Edu? --      Excl. in [place]? --    Constitutional: Alert and oriented. Well appearing and in no distress. Head: Normocephalic and atraumatic.      Eyes: Conjunctivae are normal. PERRL. Normal extraocular movements      Ears: Canals clear. TMs intact bilaterally.   Nose: No congestion/rhinorrhea.   Mouth/Throat: Mucous membranes are moist.   Neck: Supple. No thyromegaly. Hematological/Lymphatic/Immunological: No cervical lymphadenopathy. Cardiovascular: Normal rate, regular rhythm.  Respiratory: Normal respiratory effort. No wheezes/rales/rhonchi. Gastrointestinal: Soft and nontender. No distention. Musculoskeletal: Full normal range of motion of the lumbar and thoracic spine. No obvious deformity along the spinal column, no spasm, deformity, midline tenderness, or step-off. Patient was normal full range of motion of the rotator cuff on the right and normal strength testing of the right shoulder. Nontender with normal range of motion in all extremities.  Neurologic:  Normal gait without ataxia. Normal speech and language. No gross focal neurologic deficits are  appreciated. Skin:  Skin is warm, dry and intact. No rash noted. Psychiatric: Mood and affect are normal. Patient exhibits appropriate insight and judgment. ____________________________________________   LABS (pertinent positives/negatives) Labs Reviewed  URINALYSIS COMPLETEWITH MICROSCOPIC ([place])  ____________________________________________   RADIOLOGY Right Rib Detail IMPRESSION: No evidence of acute right-sided rib fracture, pleural effusion or pneumothorax. 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  I, Chioma Mukherjee, Dannielle Karvonen, personally viewed and evaluated these images (plain radiographs) as part of my medical decision making.  ____________________________________________  INITIAL IMPRESSION / ASSESSMENT AND PLAN / ED COURSE  Patient with chronic intermittent right thoracic pain that appears to be musculoskeletal in nature. There is no radiographic evidence of acute intrathoracic etiology. Patient will dose anti-inflammatories needed for pain as well as prescription trial of antispasm medicine. He will follow with Dr. Lennox Grumbles for ongoing or worsening symptoms. ____________________________________________  FINAL CLINICAL IMPRESSION(S) / ED DIAGNOSES  Final diagnoses:  Right-sided thoracic back pain      Melvenia Needles, PA-C 05/03/15 1441  Carrie Mew, MD 05/03/15 1539

## 2015-05-03 NOTE — ED Notes (Signed)
States he has had intermittent right flank pain for about 2 years. denies any urinary sx's. States pain is increased with movement and turning. Ambulates well to treatment room

## 2015-06-25 ENCOUNTER — Other Ambulatory Visit: Payer: Self-pay | Admitting: Family Medicine

## 2015-06-25 ENCOUNTER — Ambulatory Visit
Admission: RE | Admit: 2015-06-25 | Discharge: 2015-06-25 | Disposition: A | Discharge status: Home / Self Care | Payer: Medicare Other | Source: Ambulatory Visit | Attending: Family Medicine | Admitting: Family Medicine

## 2015-06-25 DIAGNOSIS — R509 Fever, unspecified: Secondary | ICD-10-CM

## 2015-06-25 DIAGNOSIS — I517 Cardiomegaly: Secondary | ICD-10-CM | POA: Insufficient documentation

## 2015-08-04 ENCOUNTER — Encounter: Payer: Self-pay | Admitting: Emergency Medicine

## 2015-08-04 ENCOUNTER — Emergency Department: Payer: Medicare Other

## 2015-08-04 ENCOUNTER — Inpatient Hospital Stay
Admission: EM | Admit: 2015-08-04 | Discharge: 2015-08-05 | DRG: 871 | Disposition: A | Discharge status: Home / Self Care | Payer: Medicare Other | Attending: Internal Medicine | Admitting: Internal Medicine

## 2015-08-04 DIAGNOSIS — Z833 Family history of diabetes mellitus: Secondary | ICD-10-CM | POA: Diagnosis not present

## 2015-08-04 DIAGNOSIS — I10 Essential (primary) hypertension: Secondary | ICD-10-CM | POA: Diagnosis present

## 2015-08-04 DIAGNOSIS — J209 Acute bronchitis, unspecified: Secondary | ICD-10-CM | POA: Diagnosis present

## 2015-08-04 DIAGNOSIS — I509 Heart failure, unspecified: Secondary | ICD-10-CM | POA: Diagnosis present

## 2015-08-04 DIAGNOSIS — Z7984 Long term (current) use of oral hypoglycemic drugs: Secondary | ICD-10-CM | POA: Diagnosis not present

## 2015-08-04 DIAGNOSIS — J189 Pneumonia, unspecified organism: Secondary | ICD-10-CM

## 2015-08-04 DIAGNOSIS — I2510 Atherosclerotic heart disease of native coronary artery without angina pectoris: Secondary | ICD-10-CM | POA: Diagnosis present

## 2015-08-04 DIAGNOSIS — Z8042 Family history of malignant neoplasm of prostate: Secondary | ICD-10-CM | POA: Diagnosis not present

## 2015-08-04 DIAGNOSIS — E119 Type 2 diabetes mellitus without complications: Secondary | ICD-10-CM | POA: Diagnosis present

## 2015-08-04 DIAGNOSIS — M797 Fibromyalgia: Secondary | ICD-10-CM | POA: Diagnosis present

## 2015-08-04 DIAGNOSIS — J440 Chronic obstructive pulmonary disease with acute lower respiratory infection: Secondary | ICD-10-CM | POA: Diagnosis present

## 2015-08-04 DIAGNOSIS — A419 Sepsis, unspecified organism: Principal | ICD-10-CM

## 2015-08-04 DIAGNOSIS — Z87891 Personal history of nicotine dependence: Secondary | ICD-10-CM

## 2015-08-04 DIAGNOSIS — I110 Hypertensive heart disease with heart failure: Secondary | ICD-10-CM | POA: Diagnosis present

## 2015-08-04 DIAGNOSIS — Z7982 Long term (current) use of aspirin: Secondary | ICD-10-CM

## 2015-08-04 DIAGNOSIS — Z8249 Family history of ischemic heart disease and other diseases of the circulatory system: Secondary | ICD-10-CM | POA: Diagnosis not present

## 2015-08-04 DIAGNOSIS — K219 Gastro-esophageal reflux disease without esophagitis: Secondary | ICD-10-CM | POA: Diagnosis present

## 2015-08-04 HISTORY — DX: Gastro-esophageal reflux disease without esophagitis: K21.9

## 2015-08-04 LAB — COMPREHENSIVE METABOLIC PANEL
ALBUMIN: 3.8 g/dL (ref 3.5–5.0)
ALK PHOS: 83 U/L (ref 38–126)
ALT: 19 U/L (ref 17–63)
AST: 15 U/L (ref 15–41)
Anion gap: 8 (ref 5–15)
BUN: 16 mg/dL (ref 6–20)
CALCIUM: 9.8 mg/dL (ref 8.9–10.3)
CHLORIDE: 108 mmol/L (ref 101–111)
CO2: 24 mmol/L (ref 22–32)
CREATININE: 1.26 mg/dL — AB (ref 0.61–1.24)
GFR calc Af Amer: >60 mL/min (ref >60)
GFR calc non Af Amer: 55 mL/min — ABNORMAL LOW (ref >60)
GLUCOSE: 115 mg/dL — AB (ref 65–99)
Potassium: 4 mmol/L (ref 3.5–5.1)
SODIUM: 140 mmol/L (ref 135–145)
Total Bilirubin: 0.8 mg/dL (ref 0.3–1.2)
Total Protein: 8.7 g/dL — ABNORMAL HIGH (ref 6.5–8.1)

## 2015-08-04 LAB — URINALYSIS COMPLETE WITH MICROSCOPIC (ARMC ONLY)
BILIRUBIN URINE: NEGATIVE
Bacteria, UA: NONE SEEN
Glucose, UA: NEGATIVE mg/dL
HGB URINE DIPSTICK: NEGATIVE
Ketones, ur: NEGATIVE mg/dL
LEUKOCYTES UA: NEGATIVE
Nitrite: NEGATIVE
PH: 6 (ref 5.0–8.0)
PROTEIN: NEGATIVE mg/dL
Specific Gravity, Urine: 1.015 (ref 1.005–1.030)

## 2015-08-04 LAB — CBC WITH DIFFERENTIAL/PLATELET
BASOS ABS: 0 10*3/uL (ref 0–0.1)
BASOS PCT: 0 %
EOS ABS: 0.1 10*3/uL (ref 0–0.7)
Eosinophils Relative: 1 %
HCT: 43.1 % (ref 40.0–52.0)
HEMOGLOBIN: 14.2 g/dL (ref 13.0–18.0)
Lymphocytes Relative: 20 %
Lymphs Abs: 2.1 10*3/uL (ref 1.0–3.6)
MCH: 29.4 pg (ref 26.0–34.0)
MCHC: 32.8 g/dL (ref 32.0–36.0)
MCV: 89.6 fL (ref 80.0–100.0)
MONO ABS: 1.7 10*3/uL — AB (ref 0.2–1.0)
MONOS PCT: 16 %
NEUTROS PCT: 63 %
Neutro Abs: 6.5 10*3/uL (ref 1.4–6.5)
Platelets: 147 10*3/uL — ABNORMAL LOW (ref 150–440)
RBC: 4.81 MIL/uL (ref 4.40–5.90)
RDW: 14.8 % — AB (ref 11.5–14.5)
WBC: 10.4 10*3/uL (ref 3.8–10.6)

## 2015-08-04 LAB — LACTIC ACID, PLASMA
LACTIC ACID, VENOUS: 1.1 mmol/L (ref 0.5–2.0)
Lactic Acid, Venous: 1 mmol/L (ref 0.5–2.0)

## 2015-08-04 LAB — GLUCOSE, CAPILLARY: GLUCOSE-CAPILLARY: 167 mg/dL — AB (ref 65–99)

## 2015-08-04 LAB — TROPONIN I

## 2015-08-04 MED ORDER — ACETAMINOPHEN 325 MG PO TABS
650.0000 mg | ORAL_TABLET | Freq: Four times a day (QID) | ORAL | Status: DC | PRN
  Administered 2015-08-05: 650 mg via ORAL
  Filled 2015-08-04: qty 2

## 2015-08-04 MED ORDER — DEXTROSE 5 % IV SOLN
500.0000 mg | Freq: Once | INTRAVENOUS | Status: AC
  Administered 2015-08-04: 500 mg via INTRAVENOUS
  Filled 2015-08-04: qty 500

## 2015-08-04 MED ORDER — DEXTROSE 5 % IV SOLN
500.0000 mg | INTRAVENOUS | Status: DC
  Filled 2015-08-04: qty 500

## 2015-08-04 MED ORDER — ONDANSETRON HCL 4 MG PO TABS: 4.0000 mg | ORAL_TABLET | Freq: Four times a day (QID) | ORAL | Status: DC | PRN

## 2015-08-04 MED ORDER — IPRATROPIUM-ALBUTEROL 0.5-2.5 (3) MG/3ML IN SOLN: 3.0000 mL | Freq: Four times a day (QID) | RESPIRATORY_TRACT | Status: DC | PRN

## 2015-08-04 MED ORDER — SIMVASTATIN 40 MG PO TABS
40.0000 mg | ORAL_TABLET | Freq: Every day | ORAL | Status: DC
  Administered 2015-08-05: 40 mg via ORAL
  Filled 2015-08-04: qty 1

## 2015-08-04 MED ORDER — OXYCODONE-ACETAMINOPHEN 5-325 MG PO TABS: 1.0000 | ORAL_TABLET | Freq: Four times a day (QID) | ORAL | Status: DC | PRN

## 2015-08-04 MED ORDER — MOMETASONE FURO-FORMOTEROL FUM 100-5 MCG/ACT IN AERO
2.0000 | INHALATION_SPRAY | Freq: Two times a day (BID) | RESPIRATORY_TRACT | Status: DC
  Administered 2015-08-05: 2 via RESPIRATORY_TRACT
  Filled 2015-08-04: qty 8.8

## 2015-08-04 MED ORDER — INSULIN ASPART 100 UNIT/ML ~~LOC~~ SOLN
0.0000 [IU] | Freq: Three times a day (TID) | SUBCUTANEOUS | Status: DC
  Administered 2015-08-05: 1 [IU] via SUBCUTANEOUS
  Filled 2015-08-04: qty 1

## 2015-08-04 MED ORDER — CARVEDILOL 25 MG PO TABS
25.0000 mg | ORAL_TABLET | Freq: Two times a day (BID) | ORAL | Status: DC
  Administered 2015-08-05: 25 mg via ORAL
  Filled 2015-08-04: qty 1

## 2015-08-04 MED ORDER — SODIUM CHLORIDE 0.9 % IJ SOLN
3.0000 mL | Freq: Two times a day (BID) | INTRAMUSCULAR | Status: DC
  Administered 2015-08-05 (×2): 3 mL via INTRAVENOUS

## 2015-08-04 MED ORDER — CLOPIDOGREL BISULFATE 75 MG PO TABS
75.0000 mg | ORAL_TABLET | Freq: Every day | ORAL | Status: DC
  Administered 2015-08-05: 75 mg via ORAL
  Filled 2015-08-04: qty 1

## 2015-08-04 MED ORDER — ONDANSETRON HCL 4 MG/2ML IJ SOLN: 4.0000 mg | Freq: Four times a day (QID) | INTRAMUSCULAR | Status: DC | PRN

## 2015-08-04 MED ORDER — ACETAMINOPHEN 325 MG PO TABS
650.0000 mg | ORAL_TABLET | Freq: Once | ORAL | Status: AC | PRN
  Administered 2015-08-04: 650 mg via ORAL

## 2015-08-04 MED ORDER — DEXTROSE 5 % IV SOLN
2.0000 g | INTRAVENOUS | Status: DC
  Filled 2015-08-04: qty 2

## 2015-08-04 MED ORDER — DEXTROSE 5 % IV SOLN
1.0000 g | Freq: Once | INTRAVENOUS | Status: AC
  Administered 2015-08-04: 1 g via INTRAVENOUS
  Filled 2015-08-04: qty 10

## 2015-08-04 MED ORDER — INSULIN ASPART 100 UNIT/ML ~~LOC~~ SOLN: 0.0000 [IU] | Freq: Every day | SUBCUTANEOUS | Status: DC

## 2015-08-04 MED ORDER — ENOXAPARIN SODIUM 40 MG/0.4ML ~~LOC~~ SOLN
40.0000 mg | Freq: Every day | SUBCUTANEOUS | Status: DC
  Administered 2015-08-05: 40 mg via SUBCUTANEOUS
  Filled 2015-08-04: qty 0.4

## 2015-08-04 MED ORDER — SODIUM CHLORIDE 0.9 % IV BOLUS (SEPSIS)
1000.0000 mL | Freq: Once | INTRAVENOUS | Status: AC
  Administered 2015-08-04: 1000 mL via INTRAVENOUS

## 2015-08-04 MED ORDER — ACETAMINOPHEN 650 MG RE SUPP: 650.0000 mg | Freq: Four times a day (QID) | RECTAL | Status: DC | PRN

## 2015-08-04 MED ORDER — FAMOTIDINE 20 MG PO TABS
20.0000 mg | ORAL_TABLET | Freq: Two times a day (BID) | ORAL | Status: DC
  Administered 2015-08-05 (×2): 20 mg via ORAL
  Filled 2015-08-04 (×2): qty 1

## 2015-08-04 MED ORDER — SODIUM CHLORIDE 0.9 % IV BOLUS (SEPSIS): 1000.0000 mL | INTRAVENOUS | Status: DC

## 2015-08-04 MED ORDER — ASPIRIN EC 81 MG PO TBEC
81.0000 mg | DELAYED_RELEASE_TABLET | Freq: Every day | ORAL | Status: DC
  Filled 2015-08-04: qty 1

## 2015-08-04 MED ORDER — LISINOPRIL 20 MG PO TABS
40.0000 mg | ORAL_TABLET | Freq: Every day | ORAL | Status: DC
  Administered 2015-08-05: 40 mg via ORAL
  Filled 2015-08-04: qty 2

## 2015-08-04 MED ORDER — ACETAMINOPHEN 325 MG PO TABS
ORAL_TABLET | ORAL | Status: AC
  Filled 2015-08-04: qty 2

## 2015-08-04 NOTE — H&P (Signed)
[facility] [place] NAME: Chris Wilkinson    MR#:  [id number]  DATE OF BIRTH:  01/29/1941  DATE OF ADMISSION:  08/04/2015  PRIMARY CARE PHYSICIAN: Marguerita Merles, MD   REQUESTING/REFERRING PHYSICIAN: Paduchowski, MD  CHIEF COMPLAINT:   Chief Complaint  Patient presents with  . Shortness of Breath    HISTORY OF PRESENT ILLNESS:  Chris Wilkinson  is a 75 y.o. male who presents with increasing shortness of breath, cough, malaise. Patient states his symptoms began about 3 days ago. He began having progressive shortness of breath initially. He developed a cough, productive of whitish to tannish sputum. He began feeling a little better, but then felt significantly worse. He came in the ED and was found here to meet sepsis criteria with a fever, tachycardia, tachypnea. Chest x-ray showed pneumonia. Hospitals were called for admission for sepsis due to community acquired pneumonia.  PAST MEDICAL HISTORY:   Past Medical History  Diagnosis Date  . Diabetes mellitus without complication ([place])   . CHF (congestive heart failure) ([place])   . COPD (chronic obstructive pulmonary disease) ([place])   . Hypertension   . Coronary artery disease   . Fibromyalgia   . GERD (gastroesophageal reflux disease)     PAST SURGICAL HISTORY:   Past Surgical History  Procedure Laterality Date  . Tonsillectomy    . Hernia repair    . Eye surgery    . Repair knee ligament    . Colonoscopy with propofol N/A 01/18/2015    Procedure: COLONOSCOPY WITH PROPOFOL;  Surgeon: Manya Silvas, MD;  Location: [facility] ENDOSCOPY;  Service: Endoscopy;  Laterality: N/A;  . Esophagogastroduodenoscopy N/A 01/18/2015    Procedure: ESOPHAGOGASTRODUODENOSCOPY (EGD);  Surgeon: Manya Silvas, MD;  Location: [facility] ENDOSCOPY;  Service: Endoscopy;  Laterality: N/A;    SOCIAL HISTORY:   Social History  Substance Use Topics  . Smoking status: Former Smoker    Quit date: 12/01/2003  .  Smokeless tobacco: Not on file  . Alcohol Use: Yes    FAMILY HISTORY:   Family History  Problem Relation Age of Onset  . Prostate cancer Father   . Prostate cancer Brother   . Diabetes      all 13 siblings  . Hypertension      all 13 siblings    DRUG ALLERGIES:  No Known Allergies  MEDICATIONS AT HOME:   Prior to Admission medications   Medication Sig Start Date End Date Taking? Authorizing Provider  albuterol-ipratropium (COMBIVENT) 18-103 MCG/ACT inhaler Inhale 2 puffs into the lungs every 6 (six) hours as needed for wheezing or shortness of breath.    Historical Provider, MD  aspirin 325 MG tablet Take 325 mg by mouth daily.    Historical Provider, MD  aspirin 81 MG tablet Take 81 mg by mouth daily.    Historical Provider, MD  carvedilol (COREG) 25 MG tablet Take 25 mg by mouth 2 (two) times daily with a meal.    Historical Provider, MD  clopidogrel (PLAVIX) 75 MG tablet Take 75 mg by mouth daily.    Historical Provider, MD  diazepam (VALIUM) 2 MG tablet Take 0.5 tablets (1 mg total) by mouth every 8 (eight) hours as needed for muscle spasms. 05/03/15   Jenise V Bacon Menshew, PA-C  Fluticasone Furoate-Vilanterol 100-25 MCG/INH AEPB Inhale into the lungs.    Historical Provider, MD  lisinopril (PRINIVIL,ZESTRIL) 40 MG tablet Take 40 mg by mouth daily.    Historical Provider,  MD  metFORMIN (GLUCOPHAGE) 500 MG tablet Take by mouth 2 (two) times daily with a meal.    Historical Provider, MD  oxyCODONE-acetaminophen (ROXICET) 5-325 MG per tablet Take 1 tablet by mouth every 6 (six) hours as needed. 03/27/15   Earleen Newport, MD  ranitidine (ZANTAC) 150 MG tablet Take 150 mg by mouth 2 (two) times daily.    Historical Provider, MD  simvastatin (ZOCOR) 20 MG tablet Take 20 mg by mouth daily.    Historical Provider, MD  simvastatin (ZOCOR) 40 MG tablet Take 40 mg by mouth daily.    Historical Provider, MD    REVIEW OF SYSTEMS:  Review of Systems  Constitutional: Positive for  fever and malaise/fatigue. Negative for chills and weight loss.  HENT: Negative for ear pain, hearing loss and tinnitus.   Eyes: Negative for blurred vision, double vision, pain and redness.  Respiratory: Positive for cough, sputum production and shortness of breath. Negative for hemoptysis.   Cardiovascular: Negative for chest pain, palpitations, orthopnea and leg swelling.  Gastrointestinal: Negative for nausea, vomiting, abdominal pain, diarrhea and constipation.  Genitourinary: Negative for dysuria, frequency and hematuria.  Musculoskeletal: Negative for back pain, joint pain and neck pain.  Skin:       No acne, rash, or lesions  Neurological: Negative for dizziness, tremors, focal weakness and weakness.  Endo/Heme/Allergies: Negative for polydipsia. Does not bruise/bleed easily.  Psychiatric/Behavioral: Negative for depression. The patient is not nervous/anxious and does not have insomnia.      VITAL SIGNS:   Filed Vitals:   08/04/15 1811 08/04/15 2100  BP: 157/81 143/94  Pulse: 107 95  Temp: 101.2 F (38.4 C)   TempSrc: Oral   Resp: 22 16  Height: '5\' 11"'$  (1.803 m)   Weight: 91.627 kg (202 lb)   SpO2: 91% 94%   Wt Readings from Last 3 Encounters:  08/04/15 91.627 kg (202 lb)  05/03/15 99.791 kg (220 lb)  01/18/15 95.709 kg (211 lb)    PHYSICAL EXAMINATION:  Physical Exam  Vitals reviewed. Constitutional: He is oriented to person, place, and time. He appears well-developed and well-nourished. No distress.  HENT:  Head: Normocephalic and atraumatic.  Mouth/Throat: Oropharynx is clear and moist.  Eyes: Conjunctivae and EOM are normal. Pupils are equal, round, and reactive to light. No scleral icterus.  Neck: Normal range of motion. Neck supple. No JVD present. No thyromegaly present.  Cardiovascular: Normal rate, regular rhythm and intact distal pulses.  Exam reveals no gallop and no friction rub.   No murmur heard. Respiratory: Effort normal. No respiratory distress.  He has no wheezes. He has no rales.  Mildly diminished breath sounds  GI: Soft. Bowel sounds are normal. He exhibits no distension. There is no tenderness.  Musculoskeletal: Normal range of motion. He exhibits no edema.  No arthritis, no gout  Lymphadenopathy:    He has no cervical adenopathy.  Neurological: He is alert and oriented to person, place, and time. No cranial nerve deficit.  No dysarthria, no aphasia  Skin: Skin is warm and dry. No rash noted. No erythema.  Psychiatric: He has a normal mood and affect. His behavior is normal. Judgment and thought content normal.    LABORATORY PANEL:   CBC  Recent Labs Lab 08/04/15 1817  WBC 10.4  HGB 14.2  HCT 43.1  PLT 147*   ------------------------------------------------------------------------------------------------------------------  Chemistries   Recent Labs Lab 08/04/15 1817  NA 140  K 4.0  CL 108  CO2 24  GLUCOSE 115*  BUN 16  CREATININE 1.26*  CALCIUM 9.8  AST 15  ALT 19  ALKPHOS 83  BILITOT 0.8   ------------------------------------------------------------------------------------------------------------------  Cardiac Enzymes  Recent Labs Lab 08/04/15 1818  TROPONINI <0.03   ------------------------------------------------------------------------------------------------------------------  RADIOLOGY:  Dg Chest 2 View  08/04/2015  CLINICAL DATA:  Acute onset of shortness of breath and generalized weakness. Initial encounter. 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 Right-sided vascular congestion is noted, with new perihilar opacity, possibly reflecting acute infection or mild interstitial edema. No pneumothorax or pleural effusion is seen. The cardiomediastinal silhouette is normal in size. No acute osseous abnormalities are identified. IMPRESSION:  1. New right perihilar opacity, with right-sided vascular congestion. This may reflect acute infection or mild interstitial edema. Followup PA and lateral chest X-ray is recommended in 3-4 weeks following trial of antibiotic therapy to ensure resolution and exclude underlying malignancy. 2. 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 Electronically Signed   By: Garald Balding M.D.   On: 08/04/2015 18:40    EKG:   Orders placed or performed during the hospital encounter of 08/04/15  . EKG 12-Lead  . EKG 12-Lead    IMPRESSION AND PLAN:  Principal Problem:   Sepsis ([place]) - lactic acid was normal, patient is hemodynamically stable, IV antibiotics started in the ED, cultures sent from the ED, though we will add a sputum culture. Continue antibiotics. Active Problems:   CAP (community acquired pneumonia) - antibiotics and cultures as above   COPD (chronic obstructive pulmonary disease) (HCC) - continue home inhalers, when necessary nebs here   Type 2 diabetes mellitus ([place]) - patient reports being "borderline diabetic". He has diabetes listed on his medical history, though his glucose is not significantly elevated here. We'll have him on a heart healthy/carb modified diet and low-dose sliding scale insulin with corresponding glucose checks before meals at bedtime. Check hemoglobin A1c.   CAD (coronary artery disease) - continue home meds, currently stable.   HTN (hypertension) - continue home meds   GERD (gastroesophageal reflux disease) - home dose H2 blocker  All the records are reviewed and case discussed with ED provider. Management plans discussed with the patient and/or family.  DVT PROPHYLAXIS: SubQ lovenox  GI PROPHYLAXIS: H2 blocker  ADMISSION STATUS: Inpatient  CODE STATUS: Full Code Status History    This patient does not have a recorded code status. Please follow your organizational policy for patients in this  situation.      TOTAL TIME TAKING CARE OF THIS PATIENT: 45 minutes.    Kenyetta Wimbish [place] 08/04/2015, 9:18 PM  [facility]  Office  [phone]  CC: Primary care physician; Marguerita Merles, MD

## 2015-08-04 NOTE — ED Notes (Signed)
Transporting patient to 1a

## 2015-08-04 NOTE — ED Notes (Addendum)
Pt c/o pain in ribs and SHOB.  Has had white productive cough.  Also had pain in legs.  No respiratory distress in triage but sats low end of normal and no hx COPD per pt although hx lists copd.  Pt says "i guess i have it then"

## 2015-08-04 NOTE — ED Provider Notes (Signed)
[facility] Emergency Department Provider Note  Time seen: 7:52 PM  I have reviewed the triage vital signs and the nursing notes.   HISTORY  Chief Complaint Shortness of Breath    HPI Chris Wilkinson is a 75 y.o. male with a past medical history of diabetes, CHF, COPD, hypertension, fibromyalgia presents the emergency department with 3 days of cough, now worse along with fever and trouble breathing. According to the patient for the past 3 days he has had a fairly persistent cough. He states yesterday he felt like he was improving however since last night he states it is gone downhill. He is now short of breath today with fever and increased cough and body aches. Denies any chest pain.     Past Medical History  Diagnosis Date  . Diabetes mellitus without complication ([place])   . CHF (congestive heart failure) ([place])   . COPD (chronic obstructive pulmonary disease) ([place])   . Hypertension   . Coronary artery disease   . Fibromyalgia     There are no active problems to display for this patient.   Past Surgical History  Procedure Laterality Date  . Tonsillectomy    . Hernia repair    . Eye surgery    . Repair knee ligament    . Colonoscopy with propofol N/A 01/18/2015    Procedure: COLONOSCOPY WITH PROPOFOL;  Surgeon: Manya Silvas, MD;  Location: [facility] ENDOSCOPY;  Service: Endoscopy;  Laterality: N/A;  . Esophagogastroduodenoscopy N/A 01/18/2015    Procedure: ESOPHAGOGASTRODUODENOSCOPY (EGD);  Surgeon: Manya Silvas, MD;  Location: [facility] ENDOSCOPY;  Service: Endoscopy;  Laterality: N/A;    Current Outpatient Rx  Name  Route  Sig  Dispense  Refill  . albuterol-ipratropium (COMBIVENT) 18-103 MCG/ACT inhaler   Inhalation   Inhale 2 puffs into the lungs every 6 (six) hours as needed for wheezing or shortness of breath.         Marland Kitchen aspirin 325 MG tablet   Oral   Take 325 mg by mouth daily.         Marland Kitchen aspirin 81 MG tablet   Oral   Take 81 mg by  mouth daily.         . carvedilol (COREG) 25 MG tablet   Oral   Take 25 mg by mouth 2 (two) times daily with a meal.         . clopidogrel (PLAVIX) 75 MG tablet   Oral   Take 75 mg by mouth daily.         . diazepam (VALIUM) 2 MG tablet   Oral   Take 0.5 tablets (1 mg total) by mouth every 8 (eight) hours as needed for muscle spasms.   10 tablet   0   . Fluticasone Furoate-Vilanterol 100-25 MCG/INH AEPB   Inhalation   Inhale into the lungs.         Marland Kitchen lisinopril (PRINIVIL,ZESTRIL) 40 MG tablet   Oral   Take 40 mg by mouth daily.         . metFORMIN (GLUCOPHAGE) 500 MG tablet   Oral   Take by mouth 2 (two) times daily with a meal.         . oxyCODONE-acetaminophen (ROXICET) 5-325 MG per tablet   Oral   Take 1 tablet by mouth every 6 (six) hours as needed.   20 tablet   0   . ranitidine (ZANTAC) 150 MG tablet   Oral   Take 150 mg by  mouth 2 (two) times daily.         . simvastatin (ZOCOR) 20 MG tablet   Oral   Take 20 mg by mouth daily.         . simvastatin (ZOCOR) 40 MG tablet   Oral   Take 40 mg by mouth daily.           Allergies Review of patient's allergies indicates no known allergies.  History reviewed. No pertinent family history.  Social History Social History  Substance Use Topics  . Smoking status: Former Smoker    Quit date: 12/01/2003  . Smokeless tobacco: None  . Alcohol Use: Yes    Review of Systems Constitutional: Positive for fever Cardiovascular: Negative for chest pain. Respiratory: Positive for shortness of breath. Positive for cough. Gastrointestinal: Negative for abdominal pain Musculoskeletal: Negative for back pain. Positive for generalized muscle aches. Neurological: Negative for headache 10-point ROS otherwise negative.  ____________________________________________   PHYSICAL EXAM:  VITAL SIGNS: ED Triage Vitals  Enc Vitals Group     BP 08/04/15 1811 157/81 mmHg     Pulse Rate 08/04/15 1811 107      Resp 08/04/15 1811 22     Temp 08/04/15 1811 101.2 F (38.4 C)     Temp Source 08/04/15 1811 Oral     SpO2 08/04/15 1811 91 %     Weight 08/04/15 1811 202 lb (91.627 kg)     Height 08/04/15 1811 '5\' 11"'$  (1.803 m)     Head Cir --      Peak Flow --      Pain Score 08/04/15 1812 8     Pain Loc --      Pain Edu? --      Excl. in [place]? --     Constitutional: Alert and oriented. Well appearing and in no distress. Eyes: Normal exam ENT   Head: Normocephalic and atraumatic   Mouth/Throat: Mucous membranes are moist. Cardiovascular: Regular rhythm, rate around 100 bpm. Respiratory: Normal respiratory effort without tachypnea nor retractions. Breath sounds are clear , no obvious rhonchi or wheeze. Gastrointestinal: Soft and nontender. No distention.   Musculoskeletal: Nontender with normal range of motion in all extremities. No lower extremity tenderness or edema. Neurologic:  Normal speech and language. No gross focal neurologic deficits  Skin:  Skin is warm, dry and intact.  Psychiatric: Mood and affect are normal.   ____________________________________________    EKG  EKG reviewed and interpreted by myself shows sinus tachycardia 105 bpm, narrow QRS, left axis deviation, nonspecific ST changes. No ST elevations.  ____________________________________________    RADIOLOGY  Chest x-ray consistent with right perihilar infiltrate   INITIAL IMPRESSION / ASSESSMENT AND PLAN / ED COURSE  Pertinent labs & imaging results that were available during my care of the patient were reviewed by me and considered in my medical decision making (see chart for details).  X-ray consistent with a right-sided pneumonia. Patient febrile in the emergency department with tachycardia and tachypnea meeting sepsis criteria. Sepsis has been initiated. Patient's blood pressure is within normal limits to hypertensive, with his history CHF we will gently hydrate. We will start IV antibiotics, send  blood cultures, and admit to the hospital. Also given the patient's body aches I have added on an influenza test as well.   CRITICAL CARE Performed by: Harvest Dark   Total critical care time: 30 minutes  Critical care time was exclusive of separately billable procedures and treating other patients.  Critical care was necessary to  treat or prevent imminent or life-threatening deterioration.  Critical care was time spent personally by me on the following activities: development of treatment plan with patient and/or surrogate as well as nursing, discussions with consultants, evaluation of patient's response to treatment, examination of patient, obtaining history from patient or surrogate, ordering and performing treatments and interventions, ordering and review of laboratory studies, ordering and review of radiographic studies, pulse oximetry and re-evaluation of patient's condition.   Labs largely within normal limits with a normal white blood cell count however patient remains febrile tachycardic and tachypnea with a lower room air oxygen saturation around 90% and right-sided pneumonia on chest x-ray. An bicep and started we will admit to the hospital for further treatment.   ____________________________________________   FINAL CLINICAL IMPRESSION(S) / ED DIAGNOSES  pneumonia Sepsis   Harvest Dark, MD 08/04/15 2037

## 2015-08-04 NOTE — Progress Notes (Signed)
ANTIBIOTIC CONSULT NOTE - INITIAL  Pharmacy Consult for azithromycin/ceftriaxone Indication: pneumonia  No Known Allergies  Patient Measurements: Height: _0  (180.3 cm) Weight: 202 lb (91.627 kg) IBW/kg (Calculated) : 75.3 Adjusted Body Weight:   Vital Signs: Temp: 101.2 F (38.4 C) (01/21 1811) Temp Source: Oral (01/21 1811) BP: 143/94 mmHg (01/21 2100) Pulse Rate: 95 (01/21 2100) Intake/Output from previous day:   Intake/Output from this shift: Total I/O In: -  Out: 250 [Urine:250]  Labs:  Recent Labs  08/04/15 1817  WBC 10.4  HGB 14.2  PLT 147*  CREATININE 1.26*   Estimated Creatinine Clearance: 60.4 mL/min (by C-G formula based on Cr of 1.26). No results for input(s): VANCOTROUGH, VANCOPEAK, VANCORANDOM, GENTTROUGH, GENTPEAK, GENTRANDOM, TOBRATROUGH, TOBRAPEAK, TOBRARND, AMIKACINPEAK, AMIKACINTROU, AMIKACIN in the last 72 hours.   Microbiology: No results found for this or any previous visit (from the past 720 hour(s)).  Medical History: Past Medical History  Diagnosis Date  . Diabetes mellitus without complication ([place])   . CHF (congestive heart failure) ([place])   . COPD (chronic obstructive pulmonary disease) ([place])   . Hypertension   . Coronary artery disease   . Fibromyalgia   . GERD (gastroesophageal reflux disease)     Medications:  Infusions:  Assessment: 74 yom cc SOB, developed 3 days ago with cough, sputum production. ED sepsis criteria met with fever, tachycardia, tachypnea, CXR shows PNA. Pharmacy consulted to dose ceftriaxone and azithromycin.  Goal of Therapy:    Plan:  Expected duration 7 days with resolution of temperature and/or normalization of WBC. Ceftriaxone 2 gm IV Q24H and azithromycin 500 mg IV Q24H.   Laural Benes, Pharm.D., BCPS Clinical Pharmacist 08/04/2015,10:08 PM

## 2015-08-05 LAB — CBC
HCT: 38.5 % — ABNORMAL LOW (ref 40.0–52.0)
Hemoglobin: 12.8 g/dL — ABNORMAL LOW (ref 13.0–18.0)
MCH: 29.7 pg (ref 26.0–34.0)
MCHC: 33.3 g/dL (ref 32.0–36.0)
MCV: 89.2 fL (ref 80.0–100.0)
PLATELETS: 126 10*3/uL — AB (ref 150–440)
RBC: 4.32 MIL/uL — ABNORMAL LOW (ref 4.40–5.90)
RDW: 14.7 % — AB (ref 11.5–14.5)
WBC: 9.5 10*3/uL (ref 3.8–10.6)

## 2015-08-05 LAB — BASIC METABOLIC PANEL
ANION GAP: 6 (ref 5–15)
BUN: 13 mg/dL (ref 6–20)
CALCIUM: 9.5 mg/dL (ref 8.9–10.3)
CO2: 25 mmol/L (ref 22–32)
Chloride: 110 mmol/L (ref 101–111)
Creatinine, Ser: 1.17 mg/dL (ref 0.61–1.24)
Glucose, Bld: 123 mg/dL — ABNORMAL HIGH (ref 65–99)
POTASSIUM: 3.8 mmol/L (ref 3.5–5.1)
Sodium: 141 mmol/L (ref 135–145)

## 2015-08-05 LAB — GLUCOSE, CAPILLARY
GLUCOSE-CAPILLARY: 148 mg/dL — AB (ref 65–99)
Glucose-Capillary: 184 mg/dL — ABNORMAL HIGH (ref 65–99)

## 2015-08-05 MED ORDER — LEVOFLOXACIN IN D5W 750 MG/150ML IV SOLN
750.0000 mg | Freq: Once | INTRAVENOUS | Status: AC
  Administered 2015-08-05: 750 mg via INTRAVENOUS
  Filled 2015-08-05 (×2): qty 150

## 2015-08-05 MED ORDER — LEVOFLOXACIN 750 MG PO TABS: 750.0000 mg | ORAL_TABLET | Freq: Every day | ORAL | Status: DC

## 2015-08-05 NOTE — Discharge Summary (Signed)
[facility] [place] NAME: Chris Wilkinson    MR#:  [id number]  DATE OF BIRTH:  06/30/1941  DATE OF ADMISSION:  08/04/2015 ADMITTING PHYSICIAN: Lance Coon, MD  DATE OF DISCHARGE: 08/05/2015  PRIMARY CARE PHYSICIAN: Marguerita Merles, MD    ADMISSION DIAGNOSIS:  Sepsis, due to unspecified organism ([place]) [A41.9] Pneumonia involving right lung, unspecified part of lung [J18.9]  DISCHARGE DIAGNOSIS:  Principal Problem:   Sepsis ([place]) Active Problems:   CAP (community acquired pneumonia)   COPD (chronic obstructive pulmonary disease) ([place])   Type 2 diabetes mellitus (HCC)   CAD (coronary artery disease)   HTN (hypertension)   GERD (gastroesophageal reflux disease)   SECONDARY DIAGNOSIS:   Past Medical History  Diagnosis Date  . Diabetes mellitus without complication ([place])   . CHF (congestive heart failure) ([place])   . COPD (chronic obstructive pulmonary disease) ([place])   . Hypertension   . Coronary artery disease   . Fibromyalgia   . GERD (gastroesophageal reflux disease)     HOSPITAL COURSE:    75 year old male with a history of diabetes type 2 and CAD who presented with sepsis from community-acquired pneumonia. For the details please further H&P.  1. Sepsis: Patient had a fever and elevated white blood cell count on admission. Sepsis is from community-acquired pneumonia. Sepsis is resolved.   2 community acquired pneumonia: Patient was on Rocephin and azithromycin will be discharged on Levaquin. He will need 5 more days of antibiotic.  3. COPD: Patient does not appear to have an acute exacerbation. Continue inhalers.  4. Borderline diabetes: Continue carb modified diet and follow-up with PCP  5. Essential hypertension: Continue Coreg    DISCHARGE CONDITIONS AND DIET:   He is stable for discharge on a heart healthy/diabetic diet   CONSULTS OBTAINED:     DRUG ALLERGIES:  No Known Allergies  DISCHARGE  MEDICATIONS:   Current Discharge Medication List    START taking these medications   Details  levofloxacin (LEVAQUIN) 750 MG tablet Take 1 tablet (750 mg total) by mouth daily. Qty: 5 tablet, Refills: 0      CONTINUE these medications which have NOT CHANGED   Details  albuterol-ipratropium (COMBIVENT) 18-103 MCG/ACT inhaler Inhale 2 puffs into the lungs every 6 (six) hours as needed for wheezing or shortness of breath.    aspirin 325 MG tablet Take 325 mg by mouth daily.    carvedilol (COREG) 25 MG tablet Take 25 mg by mouth 2 (two) times daily with a meal.    Fluticasone Furoate-Vilanterol 100-25 MCG/INH AEPB Inhale into the lungs.    lisinopril (PRINIVIL,ZESTRIL) 40 MG tablet Take 40 mg by mouth daily.    metFORMIN (GLUCOPHAGE) 500 MG tablet Take by mouth 2 (two) times daily with a meal.    simvastatin (ZOCOR) 40 MG tablet Take 40 mg by mouth daily.    diazepam (VALIUM) 2 MG tablet Take 0.5 tablets (1 mg total) by mouth every 8 (eight) hours as needed for muscle spasms. Qty: 10 tablet, Refills: 0    oxyCODONE-acetaminophen (ROXICET) 5-325 MG per tablet Take 1 tablet by mouth every 6 (six) hours as needed. Qty: 20 tablet, Refills: 0      STOP taking these medications     clopidogrel (PLAVIX) 75 MG tablet      ranitidine (ZANTAC) 150 MG tablet               Today   CHIEF COMPLAINT:  Patient is doing  well this morning. Patient has a cough but no shortness of breath or wheezing.   VITAL SIGNS:  Blood pressure 133/72, pulse 96, temperature 98.4 F (36.9 C), temperature source Oral, resp. rate 16, height '5\' 11"'$  (1.803 m), weight 91.627 kg (202 lb), SpO2 94 %.   REVIEW OF SYSTEMS:  Review of Systems  Constitutional: Negative for fever, chills and malaise/fatigue.  HENT: Negative for sore throat.   Eyes: Negative for blurred vision.  Respiratory: Negative for cough, hemoptysis, shortness of breath and wheezing.   Cardiovascular: Negative for chest pain,  palpitations and leg swelling.  Gastrointestinal: Negative for nausea, vomiting, abdominal pain, diarrhea and blood in stool.  Genitourinary: Negative for dysuria.  Musculoskeletal: Negative for back pain.  Neurological: Negative for dizziness, tremors and headaches.  Endo/Heme/Allergies: Does not bruise/bleed easily.     PHYSICAL EXAMINATION:  GENERAL:  75 y.o.-year-old patient lying in the bed with no acute distress.  NECK:  Supple, no jugular venous distention. No thyroid enlargement, no tenderness.  LUNGS: Normal breath sounds bilaterally, no wheezing, rales,rhonchi  No use of accessory muscles of respiration.  CARDIOVASCULAR: S1, S2 normal. No murmurs, rubs, or gallops.  ABDOMEN: Soft, non-tender, non-distended. Bowel sounds present. No organomegaly or mass.  EXTREMITIES: No pedal edema, cyanosis, or clubbing.  PSYCHIATRIC: The patient is alert and oriented x 3.  SKIN: No obvious rash, lesion, or ulcer.   DATA REVIEW:   CBC  Recent Labs Lab 08/05/15 0258  WBC 9.5  HGB 12.8*  HCT 38.5*  PLT 126*    Chemistries   Recent Labs Lab 08/04/15 1817 08/05/15 0258  NA 140 141  K 4.0 3.8  CL 108 110  CO2 24 25  GLUCOSE 115* 123*  BUN 16 13  CREATININE 1.26* 1.17  CALCIUM 9.8 9.5  AST 15  --   ALT 19  --   ALKPHOS 83  --   BILITOT 0.8  --     Cardiac Enzymes  Recent Labs Lab 08/04/15 1818  TROPONINI <0.03    Microbiology Results  '@MICRORSLT48'$ @  RADIOLOGY:  Dg Chest 2 View  08/04/2015  CLINICAL DATA:  Acute onset of shortness of breath and generalized weakness. Initial encounter. 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 Right-sided vascular congestion is noted, with new perihilar opacity, possibly reflecting acute infection or mild interstitial edema. No pneumothorax or pleural effusion is seen. The  cardiomediastinal silhouette is normal in size. No acute osseous abnormalities are identified. IMPRESSION: 1. New right perihilar opacity, with right-sided vascular congestion. This may reflect acute infection or mild interstitial edema. Followup PA and lateral chest X-ray is recommended in 3-4 weeks following trial of antibiotic therapy to ensure resolution and exclude underlying malignancy. 2. 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 Electronically Signed   By: Garald Balding M.D.   On: 08/04/2015 18:40      Management plans discussed with the patient and he is in agreement. Stable for discharge   Patient should follow up with PCP 1 week   CODE STATUS:     Code Status Orders        Start     Ordered   08/04/15 2239  Full code   Continuous     08/04/15 2238    Code Status History    Date Active Date  Inactive Code Status Order ID Comments User Context   This patient has a current code status but no historical code status.      TOTAL TIME TAKING CARE OF THIS PATIENT: 35 minutes.    Note: This dictation was prepared with Dragon dictation along with smaller phrase technology. Any transcriptional errors that result from this process are unintentional.  Yazmyn Valbuena M.D on 08/05/2015 at 9:04 AM  Between 7am to 6pm - Pager - [phone] After 6pm go to [email or web address] - password [email or web address]  Office  [phone]  CC: Primary care physician; Marguerita Merles, MD

## 2015-08-05 NOTE — Progress Notes (Signed)
Discharge instructions reviewed with the pt and his wife.  Pt sent out after receiving Levaquin.  Pushed by me in wheelchair to wifes waiting car

## 2015-08-05 NOTE — Progress Notes (Signed)
MD mody gave order to discontinue telemetry

## 2015-08-06 LAB — HEMOGLOBIN A1C: HEMOGLOBIN A1C: 6.1 % — AB (ref 4.0–6.0)

## 2015-08-06 LAB — URINE CULTURE: Culture: NO GROWTH

## 2015-08-09 LAB — CULTURE, BLOOD (SINGLE): CULTURE: NO GROWTH

## 2015-08-09 LAB — CULTURE, BLOOD (ROUTINE X 2)
Culture: NO GROWTH
Culture: NO GROWTH

## 2015-08-30 ENCOUNTER — Other Ambulatory Visit: Payer: Self-pay | Admitting: Family Medicine

## 2015-08-30 ENCOUNTER — Ambulatory Visit
Admission: RE | Admit: 2015-08-30 | Discharge: 2015-08-30 | Disposition: A | Discharge status: Home / Self Care | Payer: Medicare Other | Source: Ambulatory Visit | Attending: Family Medicine | Admitting: Family Medicine

## 2015-08-30 DIAGNOSIS — R918 Other nonspecific abnormal finding of lung field: Secondary | ICD-10-CM | POA: Insufficient documentation

## 2015-08-30 DIAGNOSIS — J189 Pneumonia, unspecified organism: Secondary | ICD-10-CM | POA: Diagnosis present

## 2016-03-03 ENCOUNTER — Emergency Department: Payer: Medicare Other

## 2016-03-03 ENCOUNTER — Encounter: Payer: Self-pay | Admitting: Emergency Medicine

## 2016-03-03 ENCOUNTER — Emergency Department
Admission: EM | Admit: 2016-03-03 | Discharge: 2016-03-03 | Disposition: A | Discharge status: Home / Self Care | Payer: Medicare Other | Attending: Emergency Medicine | Admitting: Emergency Medicine

## 2016-03-03 DIAGNOSIS — Z7984 Long term (current) use of oral hypoglycemic drugs: Secondary | ICD-10-CM | POA: Insufficient documentation

## 2016-03-03 DIAGNOSIS — R1033 Periumbilical pain: Secondary | ICD-10-CM | POA: Insufficient documentation

## 2016-03-03 DIAGNOSIS — Z79899 Other long term (current) drug therapy: Secondary | ICD-10-CM | POA: Diagnosis not present

## 2016-03-03 DIAGNOSIS — Z7982 Long term (current) use of aspirin: Secondary | ICD-10-CM | POA: Insufficient documentation

## 2016-03-03 DIAGNOSIS — I110 Hypertensive heart disease with heart failure: Secondary | ICD-10-CM | POA: Insufficient documentation

## 2016-03-03 DIAGNOSIS — E119 Type 2 diabetes mellitus without complications: Secondary | ICD-10-CM | POA: Diagnosis not present

## 2016-03-03 DIAGNOSIS — Z7951 Long term (current) use of inhaled steroids: Secondary | ICD-10-CM | POA: Diagnosis not present

## 2016-03-03 DIAGNOSIS — M62838 Other muscle spasm: Secondary | ICD-10-CM | POA: Insufficient documentation

## 2016-03-03 DIAGNOSIS — I509 Heart failure, unspecified: Secondary | ICD-10-CM | POA: Insufficient documentation

## 2016-03-03 DIAGNOSIS — I2510 Atherosclerotic heart disease of native coronary artery without angina pectoris: Secondary | ICD-10-CM | POA: Diagnosis not present

## 2016-03-03 DIAGNOSIS — J449 Chronic obstructive pulmonary disease, unspecified: Secondary | ICD-10-CM | POA: Insufficient documentation

## 2016-03-03 DIAGNOSIS — Z87891 Personal history of nicotine dependence: Secondary | ICD-10-CM | POA: Insufficient documentation

## 2016-03-03 LAB — CBC
HCT: 39.8 % — ABNORMAL LOW (ref 40.0–52.0)
HEMOGLOBIN: 13.6 g/dL (ref 13.0–18.0)
MCH: 30 pg (ref 26.0–34.0)
MCHC: 34 g/dL (ref 32.0–36.0)
MCV: 88.1 fL (ref 80.0–100.0)
Platelets: 134 10*3/uL — ABNORMAL LOW (ref 150–440)
RBC: 4.52 MIL/uL (ref 4.40–5.90)
RDW: 15.1 % — ABNORMAL HIGH (ref 11.5–14.5)
WBC: 5.2 10*3/uL (ref 3.8–10.6)

## 2016-03-03 LAB — BASIC METABOLIC PANEL
ANION GAP: 5 (ref 5–15)
BUN: 14 mg/dL (ref 6–20)
CALCIUM: 9.2 mg/dL (ref 8.9–10.3)
CO2: 23 mmol/L (ref 22–32)
Chloride: 110 mmol/L (ref 101–111)
Creatinine, Ser: 1.06 mg/dL (ref 0.61–1.24)
GFR calc non Af Amer: >60 mL/min (ref >60)
Glucose, Bld: 148 mg/dL — ABNORMAL HIGH (ref 65–99)
Potassium: 3.7 mmol/L (ref 3.5–5.1)
Sodium: 138 mmol/L (ref 135–145)

## 2016-03-03 LAB — TROPONIN I

## 2016-03-03 NOTE — ED Triage Notes (Signed)
Abdominal swelling for "a long time (years)".  Has been evaluated by [facility] physicians and nothing definitive found.  Also c/o SOB also with abdominal swelling.

## 2016-03-03 NOTE — ED Provider Notes (Signed)
[facility] Emergency Department Provider Note ____________________________________________   I have reviewed the triage vital signs and the triage nursing note.  HISTORY  Chief Complaint No chief complaint on file.   Historian Patient  HPI Chris Wilkinson is a 75 y.o. male who is here for evaluation of abdominal pain. He states that he gets multiple episodes of abdominal pain daily, ongoing for over a year. He states that it feels like something is moving and he can see his abdomen move and points to the periumbilical area. He states it feels like gas or twitches. No nausea or vomiting. No chest pain. No black or bloody stools. No constipation or diarrhea. Patient states he was recently seen at [facility] and had a CT scan several months ago and was told everything was normal. He has had a recent colonoscopy and endoscopy with Dr. Vira Agar and was told that there was no problems.  A symptomatic now. It doesn't sound like the symptoms are that painful when they occur, but they are quite annoying.    Past Medical History:  Diagnosis Date  . CHF (congestive heart failure) ([place])   . COPD (chronic obstructive pulmonary disease) ([place])   . Coronary artery disease   . Diabetes mellitus without complication ([place])   . Fibromyalgia   . GERD (gastroesophageal reflux disease)   . Hypertension     Patient Active Problem List   Diagnosis Date Noted  . Sepsis ([place]) 08/04/2015  . CAP (community acquired pneumonia) 08/04/2015  . COPD (chronic obstructive pulmonary disease) ([place]) 08/04/2015  . Type 2 diabetes mellitus ([place]) 08/04/2015  . CAD (coronary artery disease) 08/04/2015  . HTN (hypertension) 08/04/2015  . GERD (gastroesophageal reflux disease) 08/04/2015    Past Surgical History:  Procedure Laterality Date  . COLONOSCOPY WITH PROPOFOL N/A 01/18/2015   Procedure: COLONOSCOPY WITH PROPOFOL;  Surgeon: Manya Silvas, MD;  Location: [facility] ENDOSCOPY;  Service:  Endoscopy;  Laterality: N/A;  . ESOPHAGOGASTRODUODENOSCOPY N/A 01/18/2015   Procedure: ESOPHAGOGASTRODUODENOSCOPY (EGD);  Surgeon: Manya Silvas, MD;  Location: [facility] ENDOSCOPY;  Service: Endoscopy;  Laterality: N/A;  . EYE SURGERY    . HERNIA REPAIR    . REPAIR KNEE LIGAMENT    . TONSILLECTOMY      Prior to Admission medications   Medication Sig Start Date End Date Taking? Authorizing Provider  albuterol-ipratropium (COMBIVENT) 18-103 MCG/ACT inhaler Inhale 2 puffs into the lungs every 6 (six) hours as needed for wheezing or shortness of breath.    Historical Provider, MD  aspirin 325 MG tablet Take 325 mg by mouth daily.    Historical Provider, MD  carvedilol (COREG) 25 MG tablet Take 25 mg by mouth 2 (two) times daily with a meal.    Historical Provider, MD  diazepam (VALIUM) 2 MG tablet Take 0.5 tablets (1 mg total) by mouth every 8 (eight) hours as needed for muscle spasms. Patient not taking: Reported on 08/04/2015 05/03/15   Alita Chyle Bacon Menshew, PA-C  Fluticasone Furoate-Vilanterol 100-25 MCG/INH AEPB Inhale into the lungs.    Historical Provider, MD  levofloxacin (LEVAQUIN) 750 MG tablet Take 1 tablet (750 mg total) by mouth daily. 08/05/15   Bettey Costa, MD  lisinopril (PRINIVIL,ZESTRIL) 40 MG tablet Take 40 mg by mouth daily.    Historical Provider, MD  metFORMIN (GLUCOPHAGE) 500 MG tablet Take by mouth 2 (two) times daily with a meal.    Historical Provider, MD  oxyCODONE-acetaminophen (ROXICET) 5-325 MG per tablet Take 1 tablet by mouth  every 6 (six) hours as needed. Patient not taking: Reported on 08/04/2015 03/27/15   Earleen Newport, MD  simvastatin (ZOCOR) 40 MG tablet Take 40 mg by mouth daily.    Historical Provider, MD    No Known Allergies  Family History  Problem Relation Age of Onset  . Prostate cancer Father   . Prostate cancer Brother   . Diabetes      all 13 siblings  . Hypertension      all 13 siblings    Social History Social History  Substance Use  Topics  . Smoking status: Former Smoker    Quit date: 12/01/2003  . Smokeless tobacco: Never Used  . Alcohol use Yes    Review of Systems  Constitutional: Negative for fever. Eyes: Negative for visual changes. ENT: Negative for sore throat. Cardiovascular: Negative for chest pain. Respiratory: Negative for shortness of breath. Gastrointestinal: Negative for  vomiting and diarrhea. Genitourinary: Negative for dysuria. Musculoskeletal: Negative for back pain. Skin: Negative for rash. Neurological: Negative for headache. 10 point Review of Systems otherwise negative ____________________________________________   PHYSICAL EXAM:  VITAL SIGNS: ED Triage Vitals  Enc Vitals Group     BP 03/03/16 1247 140/74     Pulse Rate 03/03/16 1247 73     Resp 03/03/16 1247 20     Temp 03/03/16 1247 97.9 F (36.6 C)     Temp Source 03/03/16 1247 Oral     SpO2 03/03/16 1247 95 %     Weight 03/03/16 1247 220 lb (99.8 kg)     Height 03/03/16 1247 '5\' 11"'$  (1.803 m)     Head Circumference --      Peak Flow --      Pain Score 03/03/16 1251 0     Pain Loc --      Pain Edu? --      Excl. in [place]? --      Constitutional: Alert and oriented. Well appearing and in no distress. HEENT   Head: Normocephalic and atraumatic.      Eyes: Conjunctivae are normal. PERRL. Normal extraocular movements.      Ears:         Nose: No congestion/rhinnorhea.   Mouth/Throat: Mucous membranes are moist.   Neck: No stridor. Cardiovascular/Chest: Normal rate, regular rhythm.  No murmurs, rubs, or gallops. Respiratory: Normal respiratory effort without tachypnea nor retractions. Breath sounds are clear and equal bilaterally. No wheezes/rales/rhonchi. Gastrointestinal: Soft. No distention, no guarding, no rebound. Nontender. Mildly obese.  Genitourinary/rectal:Deferred Musculoskeletal: Nontender with normal range of motion in all extremities. No joint effusions.  No lower extremity tenderness.  No  edema. Neurologic:  Normal speech and language. No gross or focal neurologic deficits are appreciated. Skin:  Skin is warm, dry and intact. No rash noted. Psychiatric: Mood and affect are normal. Speech and behavior are normal. Patient exhibits appropriate insight and judgment.  ____________________________________________   EKG I, Lisa Roca, MD, the attending physician have personally viewed and interpreted all ECGs.  71 bpm. Normal sinus rhythm. Narrow QRS. Left axis. Nonspecific ST and T-wave ____________________________________________  LABS (pertinent positives/negatives)  Labs Reviewed  BASIC METABOLIC PANEL - Abnormal; Notable for the following:       Result Value   Glucose, Bld 148 (*)    All other components within normal limits  CBC - Abnormal; Notable for the following:    HCT 39.8 (*)    RDW 15.1 (*)    Platelets 134 (*)    All other components within  normal limits  TROPONIN I    ____________________________________________  RADIOLOGY All Xrays were viewed by me. Imaging interpreted by Radiologist.  None __________________________________________  PROCEDURES  Procedure(s) performed: None  Critical Care performed: None  ____________________________________________   ED COURSE / ASSESSMENT AND PLAN  Pertinent labs & imaging results that were available during my care of the patient were reviewed by me and considered in my medical decision making (see chart for details).  Chris Wilkinson is here for symptoms that occur frequently, daily, and have been there for several months, and also have been worked up with CT scan reportedly normal, and GI evaluation with colonoscopy and endoscopy.  I reviewed the colonoscopy report, from July showing no emergency findings. I reviewed the endoscopy report from July showing gastritis and duodenitis.  His symptoms do not seem to be epigastric in nature, but he does state that he thinks they might be due to gas.  The  lady describes the symptoms it sounds more like twitching that he can see which might even be muscle spasm related. In any case, I think he is safe for discharge and follow with his primary care physician and/or potentially follow back up with Dr. Vira Agar. I have recommended over-the-counter magnesium to see if that may help with muscle relaxation.    CONSULTATIONS:   None   Patient / Family / Caregiver informed of clinical course, medical decision-making process, and agree with plan.   I discussed return precautions, follow-up instructions, and discharged instructions with patient and/or family.   ___________________________________________   FINAL CLINICAL IMPRESSION(S) / ED DIAGNOSES   Final diagnoses:  Muscle spasm  Periumbilical abdominal pain              Note: This dictation was prepared with Dragon dictation. Any transcriptional errors that result from this process are unintentional    Lisa Roca, MD 03/03/16 1654

## 2016-03-03 NOTE — Discharge Instructions (Signed)
You were evaluated for ongoing abdominal pains which are intermittent, and although no certain cause was found, your exam and evaluation are reassuring in the emergency room today.  We discussed, you may try over-the-counter Magnesium pill supplement, use as directed on label (available in supplement aisle).  This might help with muscle spasms.  Return to the emergency department for any worsening condition including abdominal pain, black or bloody stools, vomiting blood, chest pain, fever, or any other symptoms concerning to you.

## 2016-06-08 ENCOUNTER — Encounter: Payer: Self-pay | Admitting: Emergency Medicine

## 2016-06-08 ENCOUNTER — Emergency Department
Admission: EM | Admit: 2016-06-08 | Discharge: 2016-06-08 | Disposition: A | Discharge status: Home / Self Care | Payer: Medicare Other | Attending: Emergency Medicine | Admitting: Emergency Medicine

## 2016-06-08 ENCOUNTER — Emergency Department: Payer: Medicare Other

## 2016-06-08 DIAGNOSIS — Z7982 Long term (current) use of aspirin: Secondary | ICD-10-CM | POA: Insufficient documentation

## 2016-06-08 DIAGNOSIS — Z79899 Other long term (current) drug therapy: Secondary | ICD-10-CM | POA: Diagnosis not present

## 2016-06-08 DIAGNOSIS — R0789 Other chest pain: Secondary | ICD-10-CM | POA: Diagnosis not present

## 2016-06-08 DIAGNOSIS — Z87891 Personal history of nicotine dependence: Secondary | ICD-10-CM | POA: Diagnosis not present

## 2016-06-08 DIAGNOSIS — J449 Chronic obstructive pulmonary disease, unspecified: Secondary | ICD-10-CM | POA: Diagnosis not present

## 2016-06-08 DIAGNOSIS — I2510 Atherosclerotic heart disease of native coronary artery without angina pectoris: Secondary | ICD-10-CM | POA: Diagnosis not present

## 2016-06-08 DIAGNOSIS — R079 Chest pain, unspecified: Secondary | ICD-10-CM

## 2016-06-08 DIAGNOSIS — I110 Hypertensive heart disease with heart failure: Secondary | ICD-10-CM | POA: Insufficient documentation

## 2016-06-08 DIAGNOSIS — I509 Heart failure, unspecified: Secondary | ICD-10-CM | POA: Diagnosis not present

## 2016-06-08 DIAGNOSIS — K30 Functional dyspepsia: Secondary | ICD-10-CM | POA: Insufficient documentation

## 2016-06-08 DIAGNOSIS — E119 Type 2 diabetes mellitus without complications: Secondary | ICD-10-CM | POA: Diagnosis not present

## 2016-06-08 DIAGNOSIS — Z7984 Long term (current) use of oral hypoglycemic drugs: Secondary | ICD-10-CM | POA: Diagnosis not present

## 2016-06-08 LAB — CBC WITH DIFFERENTIAL/PLATELET
BASOS ABS: 0 10*3/uL (ref 0–0.1)
BASOS PCT: 0 %
Eosinophils Absolute: 0.1 10*3/uL (ref 0–0.7)
Eosinophils Relative: 1 %
HEMATOCRIT: 42.9 % (ref 40.0–52.0)
Hemoglobin: 14.2 g/dL (ref 13.0–18.0)
Lymphocytes Relative: 14 %
Lymphs Abs: 1.4 10*3/uL (ref 1.0–3.6)
MCH: 28.9 pg (ref 26.0–34.0)
MCHC: 33.2 g/dL (ref 32.0–36.0)
MCV: 87.1 fL (ref 80.0–100.0)
MONO ABS: 1.6 10*3/uL — AB (ref 0.2–1.0)
Monocytes Relative: 15 %
NEUTROS ABS: 7.4 10*3/uL — AB (ref 1.4–6.5)
Neutrophils Relative %: 70 %
PLATELETS: 144 10*3/uL — AB (ref 150–440)
RBC: 4.93 MIL/uL (ref 4.40–5.90)
RDW: 15.2 % — AB (ref 11.5–14.5)
WBC: 10.5 10*3/uL (ref 3.8–10.6)

## 2016-06-08 LAB — BASIC METABOLIC PANEL
ANION GAP: 8 (ref 5–15)
BUN: 15 mg/dL (ref 6–20)
CALCIUM: 9.9 mg/dL (ref 8.9–10.3)
CO2: 23 mmol/L (ref 22–32)
Chloride: 107 mmol/L (ref 101–111)
Creatinine, Ser: 1.36 mg/dL — ABNORMAL HIGH (ref 0.61–1.24)
GFR, EST AFRICAN AMERICAN: 58 mL/min — AB (ref >60)
GFR, EST NON AFRICAN AMERICAN: 50 mL/min — AB (ref >60)
GLUCOSE: 153 mg/dL — AB (ref 65–99)
Potassium: 4 mmol/L (ref 3.5–5.1)
SODIUM: 138 mmol/L (ref 135–145)

## 2016-06-08 LAB — HEPATIC FUNCTION PANEL
ALBUMIN: 3.8 g/dL (ref 3.5–5.0)
ALK PHOS: 99 U/L (ref 38–126)
ALT: 20 U/L (ref 17–63)
AST: 19 U/L (ref 15–41)
Bilirubin, Direct: 0.1 mg/dL (ref 0.1–0.5)
Indirect Bilirubin: 0.7 mg/dL (ref 0.3–0.9)
TOTAL PROTEIN: 8.8 g/dL — AB (ref 6.5–8.1)
Total Bilirubin: 0.8 mg/dL (ref 0.3–1.2)

## 2016-06-08 LAB — TROPONIN I

## 2016-06-08 LAB — LIPASE, BLOOD: Lipase: 17 U/L (ref 11–51)

## 2016-06-08 MED ORDER — ACETAMINOPHEN 500 MG PO TABS
1000.0000 mg | ORAL_TABLET | Freq: Once | ORAL | Status: AC
  Administered 2016-06-08: 1000 mg via ORAL
  Filled 2016-06-08: qty 2

## 2016-06-08 NOTE — Discharge Instructions (Signed)
You were evaluated for chest discomfort, and as we discussed your exam and evaluation are reassuring in the ER today.  Please follow up with Dr. Humphrey Rolls tomorrow and your primary doctor this week.  Return to the emergency room for any worsening symptoms including chest pain, especially if associated with any trouble breathing, nausea, jaw pain, arm pain, dizziness, passing out, or for any abdominal pain, black or bloody stools, or any other symptoms concerning to you.  Discussed, because of your ongoing stomach problems, I recommended you might consider trying an elimination diet, such as looking up on the internet the "Whole 30" -- or at least no dairy and no gluten for 30 days to see if things improve for you.

## 2016-06-08 NOTE — ED Provider Notes (Signed)
[facility] Emergency Department Provider Note ____________________________________________   I have reviewed the triage vital signs and the triage nursing note.  HISTORY  Chief Complaint Chest Pain   Historian Patient  HPI Chris Wilkinson is a 75 y.o. male history for evaluation of chest pain overnight. He states that onset was last night, and started with gas in his mid abdomen and then seemed to travel up into his chest. It was waxing and waning a little bit but mostly constant overnight although it's pretty much gone now. He denies trouble breathing. He denies similar episodes. He denies any history of coronary artery disease and states that he had a imagingstudy done at Dr. Laurelyn Sickle office which was ok.  Symptoms onset seems more like gastritis or indigestion, however cardiac rule out was initiated. He is essentially pain-free now. His EKG is reassuring. His troponin is negative and symptoms had been ongoing all night subtlety needs a repeat troponin.    Past Medical History:  Diagnosis Date  . CHF (congestive heart failure) ([place])   . COPD (chronic obstructive pulmonary disease) ([place])   . Coronary artery disease   . Diabetes mellitus without complication ([place])   . Fibromyalgia   . GERD (gastroesophageal reflux disease)   . Hypertension     Patient Active Problem List   Diagnosis Date Noted  . Sepsis ([place]) 08/04/2015  . CAP (community acquired pneumonia) 08/04/2015  . COPD (chronic obstructive pulmonary disease) ([place]) 08/04/2015  . Type 2 diabetes mellitus ([place]) 08/04/2015  . CAD (coronary artery disease) 08/04/2015  . HTN (hypertension) 08/04/2015  . GERD (gastroesophageal reflux disease) 08/04/2015    Past Surgical History:  Procedure Laterality Date  . COLONOSCOPY WITH PROPOFOL N/A 01/18/2015   Procedure: COLONOSCOPY WITH PROPOFOL;  Surgeon: Manya Silvas, MD;  Location: [facility] ENDOSCOPY;  Service: Endoscopy;  Laterality: N/A;  .  ESOPHAGOGASTRODUODENOSCOPY N/A 01/18/2015   Procedure: ESOPHAGOGASTRODUODENOSCOPY (EGD);  Surgeon: Manya Silvas, MD;  Location: [facility] ENDOSCOPY;  Service: Endoscopy;  Laterality: N/A;  . EYE SURGERY    . HERNIA REPAIR    . REPAIR KNEE LIGAMENT    . TONSILLECTOMY      Prior to Admission medications   Medication Sig Start Date End Date Taking? Authorizing Provider  albuterol-ipratropium (COMBIVENT) 18-103 MCG/ACT inhaler Inhale 2 puffs into the lungs every 6 (six) hours as needed for wheezing or shortness of breath.   Yes Historical Provider, MD  aspirin 325 MG tablet Take 325 mg by mouth daily.   Yes Historical Provider, MD  Fluticasone Furoate-Vilanterol 100-25 MCG/INH AEPB Inhale 1 puff into the lungs daily.    Yes Historical Provider, MD  losartan (COZAAR) 100 MG tablet Take 100 mg by mouth daily.   Yes Historical Provider, MD  metFORMIN (GLUCOPHAGE) 500 MG tablet Take by mouth 2 (two) times daily with a meal.   Yes Historical Provider, MD  simvastatin (ZOCOR) 10 MG tablet Take 10 mg by mouth daily.   Yes Historical Provider, MD  carvedilol (COREG) 25 MG tablet Take 25 mg by mouth 2 (two) times daily with a meal.    Historical Provider, MD    No Known Allergies  Family History  Problem Relation Age of Onset  . Prostate cancer Father   . Prostate cancer Brother   . Diabetes      all 13 siblings  . Hypertension      all 13 siblings    Social History Social History  Substance Use Topics  . Smoking status:  Former Smoker    Quit date: 12/01/2003  . Smokeless tobacco: Never Used  . Alcohol use Yes    Review of Systems  Constitutional: Negative for fever. Eyes: Negative for visual changes. ENT: Negative for sore throat. Cardiovascular: Positive for chest pressure, no pleuritic chest pain. Respiratory: Negative for shortness of breath. Gastrointestinal: Negative for vomiting and diarrhea.  No abdominal pain now. Genitourinary: Negative for dysuria. Musculoskeletal: Negative  for back pain. Skin: Negative for rash. Neurological: Negative for headache. 10 point Review of Systems otherwise negative ____________________________________________   PHYSICAL EXAM:  VITAL SIGNS: ED Triage Vitals  Enc Vitals Group     BP --      Pulse Rate 06/08/16 0902 (!) 106     Resp 06/08/16 0902 20     Temp 06/08/16 0902 98.6 F (37 C)     Temp Source 06/08/16 0902 Oral     SpO2 06/08/16 0902 94 %     Weight 06/08/16 0901 221 lb (100.2 kg)     Height 06/08/16 0901 '5\' 11"'$  (1.803 m)     Head Circumference --      Peak Flow --      Pain Score 06/08/16 0901 3     Pain Loc --      Pain Edu? --      Excl. in [place]? --      Constitutional: Alert and oriented. Well appearing and in no distress. HEENT   Head: Normocephalic and atraumatic.      Eyes: Conjunctivae are normal. PERRL. Normal extraocular movements.      Ears:         Nose: No congestion/rhinnorhea.   Mouth/Throat: Mucous membranes are moist.   Neck: No stridor. Cardiovascular/Chest: Normal rate, regular rhythm.  No murmurs, rubs, or gallops. Respiratory: Normal respiratory effort without tachypnea nor retractions. Breath sounds are clear and equal bilaterally. No wheezes/rales/rhonchi. Gastrointestinal: Soft. No distention, no guarding, no rebound. Nontender.    Genitourinary/rectal:Deferred Musculoskeletal: Nontender with normal range of motion in all extremities. No joint effusions.  No lower extremity tenderness.  No edema. Neurologic:  Normal speech and language. No gross or focal neurologic deficits are appreciated. Skin:  Skin is warm, dry and intact. No rash noted. Psychiatric: Mood and affect are normal. Speech and behavior are normal. Patient exhibits appropriate insight and judgment.   ____________________________________________  LABS (pertinent positives/negatives)  Labs Reviewed  BASIC METABOLIC PANEL - Abnormal; Notable for the following:       Result Value   Glucose, Bld 153 (*)     Creatinine, Ser 1.36 (*)    GFR calc non Af Amer 50 (*)    GFR calc Af Amer 58 (*)    All other components within normal limits  CBC WITH DIFFERENTIAL/PLATELET - Abnormal; Notable for the following:    RDW 15.2 (*)    Platelets 144 (*)    Neutro Abs 7.4 (*)    Monocytes Absolute 1.6 (*)    All other components within normal limits  HEPATIC FUNCTION PANEL - Abnormal; Notable for the following:    Total Protein 8.8 (*)    All other components within normal limits  TROPONIN I  LIPASE, BLOOD    ____________________________________________    EKG I, Lisa Roca, MD, the attending physician have personally viewed and interpreted all ECGs.  107 bpm. Sinus tachycardia. Left axis deviation. Q waves septally. Nonspecific ST and T-wave ____________________________________________  RADIOLOGY All Xrays were viewed by me. Imaging interpreted by Radiologist.  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 No acute abnormality is noted.  __________________________________________  PROCEDURES  Procedure(s) performed: None  Critical Care performed: None  ____________________________________________   ED COURSE / ASSESSMENT AND PLAN  Pertinent labs & imaging results that were available during my care of the patient were reviewed by me and considered in my medical decision making (see chart for details).   Mr. Creeden is here with atypical chest discomfort without associated symptoms, and reassuring EKG and laboratory studies including negative troponin. Heart rate in the 90s when I saw him. It was initially 107 bpm on original triage and EKG, but is not complaining of any trouble breathing here.  No pleuritic chest discomfort.  I am not suspicious of PE clinically.  He came because he was concerned about possibility of heart attack. I spoke with his cardiologist Dr. Humphrey Rolls who recommends okay for discharge home now with  return precautions, and he will see this patient tomorrow morning at 10 AM.  Ultimately his symptoms sound GI in nature to me. We discussed some additional interventions although he states that he's been worked up by GI in the past. We discussed potentially try an elimination diet.  Essentially asymptomatic here, I'm not concerned of intra-abdominal surgical or medical emergency and of not recommending any advanced imaging at this point in time.    CONSULTATIONS:   Dr. Humphrey Rolls, cardiology by phone.   Patient / Family / Caregiver informed of clinical course, medical decision-making process, and agree with plan.   I discussed return precautions, follow-up instructions, and discharge instructions with patient and/or family.   ___________________________________________   FINAL CLINICAL IMPRESSION(S) / ED DIAGNOSES   Final diagnoses:  Nonspecific chest pain  Indigestion              Note: This dictation was prepared with Dragon dictation. Any transcriptional errors that result from this process are unintentional    Lisa Roca, MD 06/08/16 1328

## 2016-06-08 NOTE — ED Triage Notes (Signed)
C/o chest pain and SHOB. Pain better when up and moving. Pain has decreased since last night. No diaphoresis

## 2016-06-08 NOTE — ED Triage Notes (Signed)
Pt reports that he began having chest pain last night, pain has subsided some at this time but when pt was having pain he felt sob.

## 2016-06-08 NOTE — ED Notes (Signed)
Patient reports chest pain last night, patient states that he is having increased shortness of breath for the past few days. Patient states that he has also had a cough. Patient states that cough is productive with white sputum.   Patient denies chest pain at present. Patient denies, N/V, fever, or chills.

## 2016-06-08 NOTE — ED Notes (Signed)
Pt given urinal to use bathroom

## 2016-09-08 ENCOUNTER — Emergency Department: Payer: Medicare Other

## 2016-09-08 ENCOUNTER — Encounter: Payer: Self-pay | Admitting: Emergency Medicine

## 2016-09-08 ENCOUNTER — Emergency Department
Admission: EM | Admit: 2016-09-08 | Discharge: 2016-09-08 | Disposition: A | Discharge status: Home / Self Care | Payer: Medicare Other | Attending: Emergency Medicine | Admitting: Emergency Medicine

## 2016-09-08 DIAGNOSIS — I2510 Atherosclerotic heart disease of native coronary artery without angina pectoris: Secondary | ICD-10-CM | POA: Insufficient documentation

## 2016-09-08 DIAGNOSIS — Z7984 Long term (current) use of oral hypoglycemic drugs: Secondary | ICD-10-CM | POA: Insufficient documentation

## 2016-09-08 DIAGNOSIS — I509 Heart failure, unspecified: Secondary | ICD-10-CM | POA: Diagnosis not present

## 2016-09-08 DIAGNOSIS — R0602 Shortness of breath: Secondary | ICD-10-CM | POA: Diagnosis present

## 2016-09-08 DIAGNOSIS — Z7982 Long term (current) use of aspirin: Secondary | ICD-10-CM | POA: Diagnosis not present

## 2016-09-08 DIAGNOSIS — Z87891 Personal history of nicotine dependence: Secondary | ICD-10-CM | POA: Diagnosis not present

## 2016-09-08 DIAGNOSIS — Z79899 Other long term (current) drug therapy: Secondary | ICD-10-CM | POA: Diagnosis not present

## 2016-09-08 DIAGNOSIS — E119 Type 2 diabetes mellitus without complications: Secondary | ICD-10-CM | POA: Insufficient documentation

## 2016-09-08 DIAGNOSIS — I110 Hypertensive heart disease with heart failure: Secondary | ICD-10-CM | POA: Diagnosis not present

## 2016-09-08 DIAGNOSIS — J449 Chronic obstructive pulmonary disease, unspecified: Secondary | ICD-10-CM

## 2016-09-08 DIAGNOSIS — J111 Influenza due to unidentified influenza virus with other respiratory manifestations: Secondary | ICD-10-CM

## 2016-09-08 DIAGNOSIS — R69 Illness, unspecified: Secondary | ICD-10-CM

## 2016-09-08 LAB — CBC
HEMATOCRIT: 41 % (ref 40.0–52.0)
HEMOGLOBIN: 14.1 g/dL (ref 13.0–18.0)
MCH: 30.1 pg (ref 26.0–34.0)
MCHC: 34.5 g/dL (ref 32.0–36.0)
MCV: 87.3 fL (ref 80.0–100.0)
Platelets: 163 10*3/uL (ref 150–440)
RBC: 4.69 MIL/uL (ref 4.40–5.90)
RDW: 15.5 % — AB (ref 11.5–14.5)
WBC: 9.5 10*3/uL (ref 3.8–10.6)

## 2016-09-08 LAB — BASIC METABOLIC PANEL
ANION GAP: 7 (ref 5–15)
BUN: 15 mg/dL (ref 6–20)
CHLORIDE: 106 mmol/L (ref 101–111)
CO2: 27 mmol/L (ref 22–32)
Calcium: 9.9 mg/dL (ref 8.9–10.3)
Creatinine, Ser: 1.18 mg/dL (ref 0.61–1.24)
GFR calc non Af Amer: 59 mL/min — ABNORMAL LOW (ref >60)
Glucose, Bld: 105 mg/dL — ABNORMAL HIGH (ref 65–99)
POTASSIUM: 4.5 mmol/L (ref 3.5–5.1)
SODIUM: 140 mmol/L (ref 135–145)

## 2016-09-08 LAB — TROPONIN I: Troponin I: <0.03 ng/mL (ref <0.03)

## 2016-09-08 MED ORDER — PREDNISONE 10 MG PO TABS: 50.0000 mg | ORAL_TABLET | Freq: Every day | ORAL | 0 refills | Status: AC

## 2016-09-08 MED ORDER — PREDNISONE 20 MG PO TABS
60.0000 mg | ORAL_TABLET | Freq: Once | ORAL | Status: AC
  Administered 2016-09-08: 60 mg via ORAL
  Filled 2016-09-08: qty 3

## 2016-09-08 MED ORDER — AZITHROMYCIN 250 MG PO TABS: ORAL_TABLET | ORAL | 0 refills | Status: AC

## 2016-09-08 MED ORDER — OSELTAMIVIR PHOSPHATE 75 MG PO CAPS: 75.0000 mg | ORAL_CAPSULE | Freq: Two times a day (BID) | ORAL | 0 refills | Status: AC

## 2016-09-08 MED ORDER — OSELTAMIVIR PHOSPHATE 75 MG PO CAPS
75.0000 mg | ORAL_CAPSULE | Freq: Once | ORAL | Status: AC
  Administered 2016-09-08: 75 mg via ORAL
  Filled 2016-09-08: qty 1

## 2016-09-08 NOTE — ED Notes (Signed)
E-signature page would not cross signature over so printed out and pt signed on paper

## 2016-09-08 NOTE — ED Provider Notes (Signed)
[facility] Emergency Department Provider Note  ____________________________________________   First MD Initiated Contact with Patient 09/08/16 1825     (approximate)  I have reviewed the triage vital signs and the nursing notes.   HISTORY  Chief Complaint Shortness of Breath    HPI Chris Wilkinson is a 76 y.o. male with a past medical history of COPD who comes to the emergency department with 2 weeks of increasing shortness of breath. He is not oxygen dependent at home. He has no increase in his sputum production. He said for the last 24 hours or so he has felt more sick and he is concerned he may have influenza.He reports mild sharp chest pain worse with cough improved with rest. No change in his sputum recently.   Past Medical History:  Diagnosis Date  . CHF (congestive heart failure) ([place])   . COPD (chronic obstructive pulmonary disease) ([place])   . Coronary artery disease   . Diabetes mellitus without complication ([place])   . Fibromyalgia   . GERD (gastroesophageal reflux disease)   . Hypertension     Patient Active Problem List   Diagnosis Date Noted  . Sepsis ([place]) 08/04/2015  . CAP (community acquired pneumonia) 08/04/2015  . COPD (chronic obstructive pulmonary disease) ([place]) 08/04/2015  . Type 2 diabetes mellitus ([place]) 08/04/2015  . CAD (coronary artery disease) 08/04/2015  . HTN (hypertension) 08/04/2015  . GERD (gastroesophageal reflux disease) 08/04/2015    Past Surgical History:  Procedure Laterality Date  . COLONOSCOPY WITH PROPOFOL N/A 01/18/2015   Procedure: COLONOSCOPY WITH PROPOFOL;  Surgeon: Manya Silvas, MD;  Location: [facility] ENDOSCOPY;  Service: Endoscopy;  Laterality: N/A;  . ESOPHAGOGASTRODUODENOSCOPY N/A 01/18/2015   Procedure: ESOPHAGOGASTRODUODENOSCOPY (EGD);  Surgeon: Manya Silvas, MD;  Location: [facility] ENDOSCOPY;  Service: Endoscopy;  Laterality: N/A;  . EYE SURGERY    . HERNIA REPAIR    . REPAIR KNEE LIGAMENT    .  TONSILLECTOMY      Prior to Admission medications   Medication Sig Start Date End Date Taking? Authorizing Provider  albuterol-ipratropium (COMBIVENT) 18-103 MCG/ACT inhaler Inhale 2 puffs into the lungs every 6 (six) hours as needed for wheezing or shortness of breath.   Yes Historical Provider, MD  aspirin 325 MG tablet Take 325 mg by mouth daily.   Yes Historical Provider, MD  carvedilol (COREG) 25 MG tablet Take 25 mg by mouth 2 (two) times daily with a meal.   Yes Historical Provider, MD  Fluticasone Furoate-Vilanterol 100-25 MCG/INH AEPB Inhale 1 puff into the lungs daily.    Yes Historical Provider, MD  losartan (COZAAR) 100 MG tablet Take 100 mg by mouth daily.   Yes Historical Provider, MD  metFORMIN (GLUCOPHAGE) 500 MG tablet Take by mouth 2 (two) times daily with a meal.   Yes Historical Provider, MD  simvastatin (ZOCOR) 10 MG tablet Take 10 mg by mouth daily.   Yes Historical Provider, MD  azithromycin (ZITHROMAX Z-PAK) 250 MG tablet Take 2 tablets (500 mg) on  Day 1,  followed by 1 tablet (250 mg) once daily on Days 2 through 5. 09/08/16 09/13/16  Darel Hong, MD  oseltamivir (TAMIFLU) 75 MG capsule Take 1 capsule (75 mg total) by mouth 2 (two) times daily. 09/08/16 09/13/16  Darel Hong, MD  predniSONE (DELTASONE) 10 MG tablet Take 5 tablets (50 mg total) by mouth daily. 09/08/16 09/13/16  Darel Hong, MD    Allergies Patient has no known allergies.  Family History  Problem  Relation Age of Onset  . Prostate cancer Father   . Prostate cancer Brother   . Diabetes      all 13 siblings  . Hypertension      all 13 siblings    Social History Social History  Substance Use Topics  . Smoking status: Former Smoker    Quit date: 12/01/2003  . Smokeless tobacco: Never Used  . Alcohol use Yes    Review of Systems Constitutional:Positive fevers no chills Eyes: No visual changes. ENT: No sore throat. Cardiovascular: Positive chest pain. Respiratory: Positive shortness of  breath. Gastrointestinal: No abdominal pain.  No nausea, no vomiting.  No diarrhea.  No constipation. Genitourinary: Negative for dysuria. Musculoskeletal: Negative for back pain. Skin: Negative for rash. Neurological: Negative for headaches, focal weakness or numbness.  10-point ROS otherwise negative.  ____________________________________________   PHYSICAL EXAM:  VITAL SIGNS: ED Triage Vitals  Enc Vitals Group     BP 09/08/16 1700 (!) 164/84     Pulse Rate 09/08/16 1700 93     Resp 09/08/16 1700 20     Temp 09/08/16 1700 100 F (37.8 C)     Temp Source 09/08/16 1700 Oral     SpO2 09/08/16 1700 92 %     Weight 09/08/16 1700 225 lb (102.1 kg)     Height 09/08/16 1700 '5\' 11"'$  (1.803 m)     Head Circumference --      Peak Flow --      Pain Score 09/08/16 1703 5     Pain Loc --      Pain Edu? --      Excl. in [place]? --     Constitutional: Alert and oriented x 4 well appearing nontoxic no diaphoresis speaks in full, clear sentences. Slightly elevated respiratory rate  Eyes: PERRL EOMI. Head: Atraumatic. Nose: No congestion/rhinnorhea. Mouth/Throat: No trismus Neck: No stridor.   Cardiovascular: Normal rate, regular rhythm. Grossly normal heart sounds.  Good peripheral circulation. Respiratory: Normal respiratory effort.  No retractions. Mild wheeze worse on the right field but moving very good amounts of air Gastrointestinal: Soft nondistended nontender no rebound no guarding no peritonitis no McBurney's tenderness negative Rovsing's no costovertebral tenderness negative Murphy's Musculoskeletal: No lower extremity edema   Neurologic:  Normal speech and language. No gross focal neurologic deficits are appreciated. Skin:  Skin is warm, dry and intact. No rash noted. Psychiatric: Mood and affect are normal. Speech and behavior are normal.  ____________________________________________   LABS (all labs ordered are listed, but only abnormal results are displayed)  Labs  Reviewed  BASIC METABOLIC PANEL - Abnormal; Notable for the following:       Result Value   Glucose, Bld 105 (*)    GFR calc non Af Amer 59 (*)    All other components within normal limits  CBC - Abnormal; Notable for the following:    RDW 15.5 (*)    All other components within normal limits  TROPONIN I   ____________________________________________  EKG  ED ECG REPORT I, Darel Hong, the attending physician, personally viewed and interpreted this ECG.  Date: 09/11/2016 Rate: 84 Rhythm: normal sinus rhythm QRS Axis: Leftward Intervals: normal ST/T Wave abnormalities: normal Conduction Disturbances: none Narrative Interpretation: unremarkable  ____________________________________________  RADIOLOGY  Chest x-ray was significant chronic changes but no acute disease ____________________________________________   PROCEDURES  Procedure(s) performed: no  Procedures  Critical Care performed: no  ____________________________________________   INITIAL IMPRESSION / ASSESSMENT AND PLAN / ED COURSE  Pertinent labs &  imaging results that were available during my care of the patient were reviewed by me and considered in my medical decision making (see chart for details).  1 arrived in the patient's room she was relatively well-appearing with a normal work of breathing and slightly decreased breath sounds on the right. He has a low-grade fever at home. He said he does not want an albuterol treatment as he has taken it at home and he feels he does not need it. He is only asking for antibiotics for possible flu or pneumonia to think is reasonable. As she has significant pulmonary disease and his symptoms began less than 24 hours ago it is reasonable to treat with Tamiflu. Given the high prevalence in the community and no indication for testing at this time. She is discharged home in good condition with antibiotics and Tamiflu.       ____________________________________________   FINAL CLINICAL IMPRESSION(S) / ED DIAGNOSES  Final diagnoses:  Influenza-like illness  Chronic obstructive pulmonary disease, unspecified COPD type ([place])      NEW MEDICATIONS STARTED DURING THIS VISIT:  Discharge Medication List as of 09/08/2016  6:39 PM    START taking these medications   Details  azithromycin (ZITHROMAX Z-PAK) 250 MG tablet Take 2 tablets (500 mg) on  Day 1,  followed by 1 tablet (250 mg) once daily on Days 2 through 5., Print    oseltamivir (TAMIFLU) 75 MG capsule Take 1 capsule (75 mg total) by mouth 2 (two) times daily., Starting Mon 09/08/2016, Until Sat 09/13/2016, Print    predniSONE (DELTASONE) 10 MG tablet Take 5 tablets (50 mg total) by mouth daily., Starting Mon 09/08/2016, Until Sat 09/13/2016, Print         Note:  This document was prepared using Dragon voice recognition software and may include unintentional dictation errors.     Darel Hong, MD 09/11/16 1031

## 2016-09-08 NOTE — Discharge Instructions (Signed)
Please take all of your antibiotics and steroids as prescribed. Return to the emergency department for any new or worsening symptoms such as worsening shortness of breath, fevers, chills, or for any other concerns.

## 2016-09-08 NOTE — ED Triage Notes (Signed)
C/O flu like symptoms x 2 weeks.  C/O SOB with walking x 2 weeks.

## 2016-09-22 ENCOUNTER — Emergency Department: Payer: Medicare Other

## 2016-09-22 ENCOUNTER — Encounter: Payer: Self-pay | Admitting: Emergency Medicine

## 2016-09-22 ENCOUNTER — Inpatient Hospital Stay
Admission: EM | Admit: 2016-09-22 | Discharge: 2016-09-26 | DRG: 871 | Disposition: A | Discharge status: Home / Self Care | Payer: Medicare Other | Attending: Internal Medicine | Admitting: Internal Medicine

## 2016-09-22 DIAGNOSIS — J90 Pleural effusion, not elsewhere classified: Secondary | ICD-10-CM

## 2016-09-22 DIAGNOSIS — Z87891 Personal history of nicotine dependence: Secondary | ICD-10-CM | POA: Diagnosis not present

## 2016-09-22 DIAGNOSIS — A419 Sepsis, unspecified organism: Principal | ICD-10-CM | POA: Diagnosis present

## 2016-09-22 DIAGNOSIS — J439 Emphysema, unspecified: Secondary | ICD-10-CM | POA: Diagnosis present

## 2016-09-22 DIAGNOSIS — I509 Heart failure, unspecified: Secondary | ICD-10-CM | POA: Diagnosis present

## 2016-09-22 DIAGNOSIS — E119 Type 2 diabetes mellitus without complications: Secondary | ICD-10-CM | POA: Diagnosis present

## 2016-09-22 DIAGNOSIS — Z7982 Long term (current) use of aspirin: Secondary | ICD-10-CM

## 2016-09-22 DIAGNOSIS — Z7984 Long term (current) use of oral hypoglycemic drugs: Secondary | ICD-10-CM | POA: Diagnosis not present

## 2016-09-22 DIAGNOSIS — K219 Gastro-esophageal reflux disease without esophagitis: Secondary | ICD-10-CM | POA: Diagnosis present

## 2016-09-22 DIAGNOSIS — I110 Hypertensive heart disease with heart failure: Secondary | ICD-10-CM | POA: Diagnosis present

## 2016-09-22 DIAGNOSIS — M797 Fibromyalgia: Secondary | ICD-10-CM | POA: Diagnosis present

## 2016-09-22 DIAGNOSIS — J440 Chronic obstructive pulmonary disease with acute lower respiratory infection: Secondary | ICD-10-CM

## 2016-09-22 DIAGNOSIS — I10 Essential (primary) hypertension: Secondary | ICD-10-CM | POA: Diagnosis present

## 2016-09-22 DIAGNOSIS — I2510 Atherosclerotic heart disease of native coronary artery without angina pectoris: Secondary | ICD-10-CM | POA: Diagnosis present

## 2016-09-22 DIAGNOSIS — R918 Other nonspecific abnormal finding of lung field: Secondary | ICD-10-CM | POA: Diagnosis present

## 2016-09-22 DIAGNOSIS — N179 Acute kidney failure, unspecified: Secondary | ICD-10-CM | POA: Diagnosis not present

## 2016-09-22 DIAGNOSIS — J189 Pneumonia, unspecified organism: Secondary | ICD-10-CM | POA: Diagnosis present

## 2016-09-22 DIAGNOSIS — J1100 Influenza due to unidentified influenza virus with unspecified type of pneumonia: Secondary | ICD-10-CM | POA: Diagnosis present

## 2016-09-22 DIAGNOSIS — J111 Influenza due to unidentified influenza virus with other respiratory manifestations: Secondary | ICD-10-CM | POA: Diagnosis present

## 2016-09-22 DIAGNOSIS — R5081 Fever presenting with conditions classified elsewhere: Secondary | ICD-10-CM

## 2016-09-22 DIAGNOSIS — Y95 Nosocomial condition: Secondary | ICD-10-CM | POA: Diagnosis present

## 2016-09-22 DIAGNOSIS — J209 Acute bronchitis, unspecified: Secondary | ICD-10-CM | POA: Diagnosis present

## 2016-09-22 LAB — URINALYSIS, COMPLETE (UACMP) WITH MICROSCOPIC
BACTERIA UA: NONE SEEN
BILIRUBIN URINE: NEGATIVE
Glucose, UA: NEGATIVE mg/dL
HGB URINE DIPSTICK: NEGATIVE
KETONES UR: 5 mg/dL — AB
LEUKOCYTES UA: NEGATIVE
NITRITE: NEGATIVE
PROTEIN: NEGATIVE mg/dL
Specific Gravity, Urine: 1.014 (ref 1.005–1.030)
Squamous Epithelial / LPF: NONE SEEN
pH: 7 (ref 5.0–8.0)

## 2016-09-22 LAB — COMPREHENSIVE METABOLIC PANEL
ALBUMIN: 3.4 g/dL — AB (ref 3.5–5.0)
ALT: 19 U/L (ref 17–63)
ANION GAP: 7 (ref 5–15)
AST: 20 U/L (ref 15–41)
Alkaline Phosphatase: 101 U/L (ref 38–126)
BILIRUBIN TOTAL: 0.5 mg/dL (ref 0.3–1.2)
BUN: 12 mg/dL (ref 6–20)
CHLORIDE: 106 mmol/L (ref 101–111)
CO2: 24 mmol/L (ref 22–32)
Calcium: 9.5 mg/dL (ref 8.9–10.3)
Creatinine, Ser: 1.22 mg/dL (ref 0.61–1.24)
GFR calc Af Amer: >60 mL/min (ref >60)
GFR, EST NON AFRICAN AMERICAN: 57 mL/min — AB (ref >60)
Glucose, Bld: 176 mg/dL — ABNORMAL HIGH (ref 65–99)
POTASSIUM: 3.7 mmol/L (ref 3.5–5.1)
Sodium: 137 mmol/L (ref 135–145)
Total Protein: 8.3 g/dL — ABNORMAL HIGH (ref 6.5–8.1)

## 2016-09-22 LAB — CBC WITH DIFFERENTIAL/PLATELET
BASOS ABS: 0 10*3/uL (ref 0–0.1)
BASOS PCT: 0 %
Eosinophils Absolute: 0.1 10*3/uL (ref 0–0.7)
Eosinophils Relative: 1 %
HEMATOCRIT: 41.3 % (ref 40.0–52.0)
HEMOGLOBIN: 14 g/dL (ref 13.0–18.0)
LYMPHS PCT: 8 %
Lymphs Abs: 0.9 10*3/uL — ABNORMAL LOW (ref 1.0–3.6)
MCH: 29.3 pg (ref 26.0–34.0)
MCHC: 33.9 g/dL (ref 32.0–36.0)
MCV: 86.2 fL (ref 80.0–100.0)
Monocytes Absolute: 1.3 10*3/uL — ABNORMAL HIGH (ref 0.2–1.0)
Monocytes Relative: 13 %
NEUTROS ABS: 8 10*3/uL — AB (ref 1.4–6.5)
NEUTROS PCT: 78 %
Platelets: 150 10*3/uL (ref 150–440)
RBC: 4.79 MIL/uL (ref 4.40–5.90)
RDW: 15.6 % — ABNORMAL HIGH (ref 11.5–14.5)
WBC: 10.4 10*3/uL (ref 3.8–10.6)

## 2016-09-22 LAB — LACTIC ACID, PLASMA: LACTIC ACID, VENOUS: 1.5 mmol/L (ref 0.5–1.9)

## 2016-09-22 MED ORDER — DEXTROSE 5 % IV SOLN: 1.0000 g | Freq: Once | INTRAVENOUS | Status: DC

## 2016-09-22 MED ORDER — ACETAMINOPHEN 500 MG PO TABS
1000.0000 mg | ORAL_TABLET | Freq: Once | ORAL | Status: AC
  Administered 2016-09-22: 1000 mg via ORAL
  Filled 2016-09-22: qty 2

## 2016-09-22 MED ORDER — VANCOMYCIN HCL IN DEXTROSE 1-5 GM/200ML-% IV SOLN
1000.0000 mg | Freq: Once | INTRAVENOUS | Status: AC
  Administered 2016-09-22: 1000 mg via INTRAVENOUS
  Filled 2016-09-22: qty 200

## 2016-09-22 MED ORDER — CEFTRIAXONE SODIUM-DEXTROSE 1-3.74 GM-% IV SOLR
1.0000 g | Freq: Once | INTRAVENOUS | Status: AC
  Administered 2016-09-22: 1 g via INTRAVENOUS
  Filled 2016-09-22: qty 50

## 2016-09-22 MED ORDER — DEXTROSE 5 % IV SOLN
500.0000 mg | Freq: Once | INTRAVENOUS | Status: DC
  Filled 2016-09-22: qty 500

## 2016-09-22 NOTE — H&P (Signed)
[facility] [place] NAME: Chris Wilkinson    MR#:  [id number]  DATE OF BIRTH:  [birth year]  DATE OF ADMISSION:  09/22/2016  PRIMARY CARE PHYSICIAN: Nino Glow McLean-Scocuzza, MD   REQUESTING/REFERRING PHYSICIAN: Cinda Quest, MD  CHIEF COMPLAINT:   Chief Complaint  Patient presents with  . Fever    HISTORY OF PRESENT ILLNESS:  Chris Wilkinson  is a 76 y.o. male who presents with Fever, chills, cough, shortness of breath. He was diagnosed with influenza about a week ago and given Tamiflu. He states that his symptoms grew worse, and he came in today for evaluation. Here he was found to meet sepsis criteria, and on x-ray imaging has pneumonia.  PAST MEDICAL HISTORY:   Past Medical History:  Diagnosis Date  . CHF (congestive heart failure) ([place])   . COPD (chronic obstructive pulmonary disease) ([place])   . Coronary artery disease   . Diabetes mellitus without complication ([place])   . Fibromyalgia   . GERD (gastroesophageal reflux disease)   . Hypertension     PAST SURGICAL HISTORY:   Past Surgical History:  Procedure Laterality Date  . COLONOSCOPY WITH PROPOFOL N/A 01/18/2015   Procedure: COLONOSCOPY WITH PROPOFOL;  Surgeon: Manya Silvas, MD;  Location: [facility] ENDOSCOPY;  Service: Endoscopy;  Laterality: N/A;  . ESOPHAGOGASTRODUODENOSCOPY N/A 01/18/2015   Procedure: ESOPHAGOGASTRODUODENOSCOPY (EGD);  Surgeon: Manya Silvas, MD;  Location: [facility] ENDOSCOPY;  Service: Endoscopy;  Laterality: N/A;  . EYE SURGERY    . HERNIA REPAIR    . REPAIR KNEE LIGAMENT    . TONSILLECTOMY      SOCIAL HISTORY:   Social History  Substance Use Topics  . Smoking status: Former Smoker    Quit date: 12/01/2003  . Smokeless tobacco: Never Used  . Alcohol use Yes    FAMILY HISTORY:   Family History  Problem Relation Age of Onset  . Prostate cancer Father   . Prostate cancer Brother   . Diabetes      all 13 siblings  . Hypertension      all 13  siblings    DRUG ALLERGIES:  No Known Allergies  MEDICATIONS AT HOME:   Prior to Admission medications   Medication Sig Start Date End Date Taking? Authorizing Provider  albuterol-ipratropium (COMBIVENT) 18-103 MCG/ACT inhaler Inhale 2 puffs into the lungs every 6 (six) hours as needed for wheezing or shortness of breath.   Yes Historical Provider, MD  aspirin 325 MG tablet Take 325 mg by mouth daily.   Yes Historical Provider, MD  carvedilol (COREG) 25 MG tablet Take 25 mg by mouth 2 (two) times daily with a meal.   Yes Historical Provider, MD  Fluticasone Furoate-Vilanterol 100-25 MCG/INH AEPB Inhale 1 puff into the lungs daily.    Yes Historical Provider, MD  losartan (COZAAR) 100 MG tablet Take 100 mg by mouth daily.   Yes Historical Provider, MD  metFORMIN (GLUCOPHAGE) 500 MG tablet Take by mouth 2 (two) times daily with a meal.   Yes Historical Provider, MD  simvastatin (ZOCOR) 10 MG tablet Take 10 mg by mouth daily.   Yes Historical Provider, MD    REVIEW OF SYSTEMS:  Review of Systems  Constitutional: Positive for chills and fever. Negative for malaise/fatigue and weight loss.  HENT: Negative for ear pain, hearing loss and tinnitus.   Eyes: Negative for blurred vision, double vision, pain and redness.  Respiratory: Positive for cough and shortness of breath. Negative for hemoptysis.  Cardiovascular: Negative for chest pain, palpitations, orthopnea and leg swelling.  Gastrointestinal: Negative for abdominal pain, constipation, diarrhea, nausea and vomiting.  Genitourinary: Negative for dysuria, frequency and hematuria.  Musculoskeletal: Negative for back pain, joint pain and neck pain.  Skin:       No acne, rash, or lesions  Neurological: Negative for dizziness, tremors, focal weakness and weakness.  Endo/Heme/Allergies: Negative for polydipsia. Does not bruise/bleed easily.  Psychiatric/Behavioral: Negative for depression. The patient is not nervous/anxious and does not have  insomnia.      VITAL SIGNS:   Vitals:   09/22/16 2122 09/22/16 2130 09/22/16 2200 09/22/16 2258  BP: (!) 148/84 (!) 146/89 (!) 148/82   Pulse: (!) 112 (!) 110 (!) 106   Resp: 20 (!) 21 (!) 23   Temp: (!) 102.6 F (39.2 C)   100.2 F (37.9 C)  TempSrc: Oral   Oral  SpO2: 92% (!) 89% 94%   Weight: 102.1 kg (225 lb)     Height: '5\' 11"'$  (1.803 m)      Wt Readings from Last 3 Encounters:  09/22/16 102.1 kg (225 lb)  09/08/16 102.1 kg (225 lb)  06/08/16 100.2 kg (221 lb)    PHYSICAL EXAMINATION:  Physical Exam  Vitals reviewed. Constitutional: He is oriented to person, place, and time. He appears well-developed and well-nourished. No distress.  HENT:  Head: Normocephalic and atraumatic.  Mouth/Throat: Oropharynx is clear and moist.  Eyes: Conjunctivae and EOM are normal. Pupils are equal, round, and reactive to light. No scleral icterus.  Neck: Normal range of motion. Neck supple. No JVD present. No thyromegaly present.  Cardiovascular: Normal rate, regular rhythm and intact distal pulses.  Exam reveals no gallop and no friction rub.   No murmur heard. Respiratory: Effort normal. No respiratory distress. He has no wheezes. He has no rales.  Diffuse rhonchi  GI: Soft. Bowel sounds are normal. He exhibits no distension. There is no tenderness.  Musculoskeletal: Normal range of motion. He exhibits no edema.  No arthritis, no gout  Lymphadenopathy:    He has no cervical adenopathy.  Neurological: He is alert and oriented to person, place, and time. No cranial nerve deficit.  No dysarthria, no aphasia  Skin: Skin is warm and dry. No rash noted. No erythema.  Psychiatric: He has a normal mood and affect. His behavior is normal. Judgment and thought content normal.    LABORATORY PANEL:   CBC  Recent Labs Lab 09/22/16 2134  WBC 10.4  HGB 14.0  HCT 41.3  PLT 150    ------------------------------------------------------------------------------------------------------------------  Chemistries   Recent Labs Lab 09/22/16 2134  NA 137  K 3.7  CL 106  CO2 24  GLUCOSE 176*  BUN 12  CREATININE 1.22  CALCIUM 9.5  AST 20  ALT 19  ALKPHOS 101  BILITOT 0.5   ------------------------------------------------------------------------------------------------------------------  Cardiac Enzymes No results for input(s): TROPONINI in the last 168 hours. ------------------------------------------------------------------------------------------------------------------  RADIOLOGY:  Dg Chest 2 View  Result Date: 09/22/2016 CLINICAL DATA:  76 y/o  M; shortness of breath. EXAM: CHEST  2 VIEW COMPARISON:  09/08/2013 CT of the chest. FINDINGS: Severe emphysema with bullous changes of the left lung are stable. Increased prominence of interstitial markings best appreciated in the right lung from prior radiographs. No consolidation. No pleural effusion. Stable stable mild cardiomegaly. Moderate degenerative changes of the spine. IMPRESSION: Severe bullous emphysema. Stable mild cardiomegaly. Increase interstitial markings best appreciated in right lung may represent interval development of interstitial edema or atypical pneumonia.  Electronically Signed   By: Kristine Garbe M.D.   On: 09/22/2016 22:02    EKG:   Orders placed or performed during the hospital encounter of 09/08/16  . ED EKG within 10 minutes  . ED EKG within 10 minutes  . EKG    IMPRESSION AND PLAN:  Principal Problem:   Sepsis ([place]) - hemodynamically stable, lactic acid within normal limits. IV antibiotics started, cultures sent from the ED Active Problems:   HCAP (healthcare-associated pneumonia) - antibiotics and cultures as above. When necessary duo nebs and antitussive   COPD (chronic obstructive pulmonary disease) ([place]) - continue home inhalers   Type 2 diabetes mellitus (HCC) -  sliding scale insulin with corresponding glucose checks   CAD (coronary artery disease) - continue home meds   HTN (hypertension) - stable, continue home medications   GERD (gastroesophageal reflux disease) - home dose PPI  All the records are reviewed and case discussed with ED provider. Management plans discussed with the patient and/or family.  DVT PROPHYLAXIS: SubQ lovenox  GI PROPHYLAXIS: PPI  ADMISSION STATUS: Inpatient  CODE STATUS: Full Code Status History    Date Active Date Inactive Code Status Order ID Comments User Context   08/04/2015 10:38 PM 08/05/2015  3:28 PM Full Code [id number]  Lance Coon, MD ED      TOTAL TIME TAKING CARE OF THIS PATIENT: 45 minutes.    Chris Wilkinson Amber 09/22/2016, 11:47 PM  [facility]  Office  [phone]  CC: Primary care physician; Nino Glow McLean-Scocuzza, MD

## 2016-09-22 NOTE — ED Notes (Signed)
Pt has productive cough, clear phlegm.  Sx for 2 weeks.  Flu positive 2 weeks ago. Not any better.  Pt alert.

## 2016-09-22 NOTE — ED Triage Notes (Signed)
Pt was dx with flu 2 weeks ago and now feeling worse.  States today has had increased weakness and nausea.

## 2016-09-22 NOTE — ED Provider Notes (Signed)
[facility] Emergency Department Provider Note   ____________________________________________   First MD Initiated Contact with Patient 09/22/16 2138     (approximate)  I have reviewed the triage vital signs and the nursing notes.   HISTORY  Chief Complaint Fever   HPI Chris Wilkinson is a 76 y.o. male who developed shaking chills and a fever today. He is aching all over. He feels like he did when he had the flu 2 weeks ago. He really hasn't gotten any better in fact feels somewhat worse now. He has a deep wet cough. He reports cough really isn't productive. He somewhat short of breath but not very badly at all. His O2 sats in the ER 91% on room air.   Past Medical History:  Diagnosis Date  . CHF (congestive heart failure) ([place])   . COPD (chronic obstructive pulmonary disease) ([place])   . Coronary artery disease   . Diabetes mellitus without complication ([place])   . Fibromyalgia   . GERD (gastroesophageal reflux disease)   . Hypertension     Patient Active Problem List   Diagnosis Date Noted  . Sepsis ([place]) 08/04/2015  . CAP (community acquired pneumonia) 08/04/2015  . COPD (chronic obstructive pulmonary disease) ([place]) 08/04/2015  . Type 2 diabetes mellitus ([place]) 08/04/2015  . CAD (coronary artery disease) 08/04/2015  . HTN (hypertension) 08/04/2015  . GERD (gastroesophageal reflux disease) 08/04/2015    Past Surgical History:  Procedure Laterality Date  . COLONOSCOPY WITH PROPOFOL N/A 01/18/2015   Procedure: COLONOSCOPY WITH PROPOFOL;  Surgeon: Manya Silvas, MD;  Location: [facility] ENDOSCOPY;  Service: Endoscopy;  Laterality: N/A;  . ESOPHAGOGASTRODUODENOSCOPY N/A 01/18/2015   Procedure: ESOPHAGOGASTRODUODENOSCOPY (EGD);  Surgeon: Manya Silvas, MD;  Location[facility] ENDOSCOPY;  Service: Endoscopy;  Laterality: N/A;  . EYE SURGERY    . HERNIA REPAIR    . REPAIR KNEE LIGAMENT    . TONSILLECTOMY      Prior to Admission medications   Medication  Sig Start Date End Date Taking? Authorizing Provider  albuterol-ipratropium (COMBIVENT) 18-103 MCG/ACT inhaler Inhale 2 puffs into the lungs every 6 (six) hours as needed for wheezing or shortness of breath.    Historical Provider, MD  aspirin 325 MG tablet Take 325 mg by mouth daily.    Historical Provider, MD  carvedilol (COREG) 25 MG tablet Take 25 mg by mouth 2 (two) times daily with a meal.    Historical Provider, MD  Fluticasone Furoate-Vilanterol 100-25 MCG/INH AEPB Inhale 1 puff into the lungs daily.     Historical Provider, MD  losartan (COZAAR) 100 MG tablet Take 100 mg by mouth daily.    Historical Provider, MD  metFORMIN (GLUCOPHAGE) 500 MG tablet Take by mouth 2 (two) times daily with a meal.    Historical Provider, MD  simvastatin (ZOCOR) 10 MG tablet Take 10 mg by mouth daily.    Historical Provider, MD    Allergies Patient has no known allergies.  Family History  Problem Relation Age of Onset  . Prostate cancer Father   . Prostate cancer Brother   . Diabetes      all 13 siblings  . Hypertension      all 13 siblings    Social History Social History  Substance Use Topics  . Smoking status: Former Smoker    Quit date: 12/01/2003  . Smokeless tobacco: Never Used  . Alcohol use Yes    Review of Systems Constitutional: No fever/chills Eyes: No visual changes. ENT: No  sore throat. Cardiovascular: Denies chest pain. Respiratory: Denies shortness of breath. Gastrointestinal: No abdominal pain.  No nausea, no vomiting.  No diarrhea.  No constipation. Genitourinary: Negative for dysuria. Musculoskeletal: Negative for back pain. Skin: Negative for rash. Neurological: Negative for headaches, focal weakness or numbness.  10-point ROS otherwise negative.  ____________________________________________   PHYSICAL EXAM:  VITAL SIGNS: ED Triage Vitals [09/22/16 2122]  Enc Vitals Group     BP (!) 148/84     Pulse Rate (!) 112     Resp 20     Temp (!) 102.6 F (39.2  C)     Temp Source Oral     SpO2 92 %     Weight 225 lb (102.1 kg)     Height '5\' 11"'$  (1.803 m)     Head Circumference      Peak Flow      Pain Score 0     Pain Loc      Pain Edu?      Excl. in [place]?     Constitutional: Alert and oriented. Well appearing and in no acute distress. Eyes: Conjunctivae are normal. PERRL. EOMI. Head: Atraumatic. Nose: No congestion/rhinnorhea. Mouth/Throat: Mucous membranes are moist.  Oropharynx non-erythematous. Neck: No stridor.   Cardiovascular: Normal rate, regular rhythm. Grossly normal heart sounds.  Good peripheral circulation. Respiratory: Normal respiratory effort.  No retractions. Lungs CTAB! Gastrointestinal: Soft and nontender. No distention. No abdominal bruits. No CVA tenderness. Musculoskeletal: No lower extremity tenderness Slight trace edema.  No joint effusions. Neurologic:  Normal speech and language. No gross focal neurologic deficits are appreciated.  Skin:  Skin is warm, dry and intact. No rash noted.   ____________________________________________   LABS (all labs ordered are listed, but only abnormal results are displayed)  Labs Reviewed  COMPREHENSIVE METABOLIC PANEL - Abnormal; Notable for the following:       Result Value   Glucose, Bld 176 (*)    Total Protein 8.3 (*)    Albumin 3.4 (*)    GFR calc non Af Amer 57 (*)    All other components within normal limits  CBC WITH DIFFERENTIAL/PLATELET - Abnormal; Notable for the following:    RDW 15.6 (*)    Neutro Abs 8.0 (*)    Lymphs Abs 0.9 (*)    Monocytes Absolute 1.3 (*)    All other components within normal limits  URINALYSIS, COMPLETE (UACMP) WITH MICROSCOPIC - Abnormal; Notable for the following:    Color, Urine YELLOW (*)    APPearance CLEAR (*)    Ketones, ur 5 (*)    All other components within normal limits  CULTURE, BLOOD (ROUTINE X 2)  CULTURE, BLOOD (ROUTINE X 2)  URINE CULTURE  LACTIC ACID, PLASMA  LACTIC ACID, PLASMA  INFLUENZA PANEL BY PCR (TYPE  A & B)   ____________________________________________  EKG   ____________________________________________  RADIOLOGY Study Result   CLINICAL DATA:  76 y/o  M; shortness of breath.  EXAM: CHEST  2 VIEW  COMPARISON:  09/08/2013 CT of the chest.  FINDINGS: Severe emphysema with bullous changes of the left lung are stable. Increased prominence of interstitial markings best appreciated in the right lung from prior radiographs. No consolidation. No pleural effusion. Stable stable mild cardiomegaly. Moderate degenerative changes of the spine.  IMPRESSION: Severe bullous emphysema. Stable mild cardiomegaly. Increase interstitial markings best appreciated in right lung may represent interval development of interstitial edema or atypical pneumonia.   Electronically Signed   By: Edgardo Roys.D.  On: 09/22/2016 22:02     ______________________________________   PROCEDURES  Procedure(s) performed:   Procedures  Critical Care performed:   ____________________________________________   INITIAL IMPRESSION / ASSESSMENT AND PLAN / ED COURSE  Pertinent labs & imaging results that were available during my care of the patient were reviewed by me and considered in my medical decision making (see chart for details).        ____________________________________________   FINAL CLINICAL IMPRESSION(S) / ED DIAGNOSES  Final diagnoses:  Fever in other diseases  Community acquired pneumonia, unspecified laterality      NEW MEDICATIONS STARTED DURING THIS VISIT:  New Prescriptions   No medications on file     Note:  This document was prepared using Dragon voice recognition software and may include unintentional dictation errors.    Nena Polio, MD 09/22/16 2242

## 2016-09-22 NOTE — ED Notes (Signed)
Report off to kasey rn  

## 2016-09-23 ENCOUNTER — Inpatient Hospital Stay (HOSPITAL_COMMUNITY)
Admit: 2016-09-23 | Discharge: 2016-09-23 | Disposition: A | Discharge status: Home / Self Care | Payer: Medicare Other | Attending: Internal Medicine | Admitting: Internal Medicine

## 2016-09-23 DIAGNOSIS — I509 Heart failure, unspecified: Secondary | ICD-10-CM

## 2016-09-23 LAB — CBC
HEMATOCRIT: 42.5 % (ref 40.0–52.0)
HEMOGLOBIN: 14.3 g/dL (ref 13.0–18.0)
MCH: 29.1 pg (ref 26.0–34.0)
MCHC: 33.8 g/dL (ref 32.0–36.0)
MCV: 86.1 fL (ref 80.0–100.0)
Platelets: 132 10*3/uL — ABNORMAL LOW (ref 150–440)
RBC: 4.93 MIL/uL (ref 4.40–5.90)
RDW: 15.4 % — ABNORMAL HIGH (ref 11.5–14.5)
WBC: 8.7 10*3/uL (ref 3.8–10.6)

## 2016-09-23 LAB — BASIC METABOLIC PANEL
Anion gap: 5 (ref 5–15)
BUN: 12 mg/dL (ref 6–20)
CO2: 28 mmol/L (ref 22–32)
Calcium: 9.6 mg/dL (ref 8.9–10.3)
Chloride: 107 mmol/L (ref 101–111)
Creatinine, Ser: 1.28 mg/dL — ABNORMAL HIGH (ref 0.61–1.24)
GFR, EST NON AFRICAN AMERICAN: 53 mL/min — AB (ref >60)
GLUCOSE: 175 mg/dL — AB (ref 65–99)
POTASSIUM: 4.4 mmol/L (ref 3.5–5.1)
Sodium: 140 mmol/L (ref 135–145)

## 2016-09-23 LAB — INFLUENZA PANEL BY PCR (TYPE A & B)
INFLAPCR: POSITIVE — AB
INFLBPCR: NEGATIVE

## 2016-09-23 LAB — GLUCOSE, CAPILLARY
GLUCOSE-CAPILLARY: 177 mg/dL — AB (ref 65–99)
GLUCOSE-CAPILLARY: 175 mg/dL — AB (ref 65–99)
Glucose-Capillary: 146 mg/dL — ABNORMAL HIGH (ref 65–99)
Glucose-Capillary: 147 mg/dL — ABNORMAL HIGH (ref 65–99)

## 2016-09-23 LAB — MRSA PCR SCREENING: MRSA by PCR: NEGATIVE

## 2016-09-23 LAB — PROCALCITONIN: PROCALCITONIN: 0.13 ng/mL

## 2016-09-23 MED ORDER — FUROSEMIDE 10 MG/ML IJ SOLN
40.0000 mg | Freq: Every day | INTRAMUSCULAR | Status: DC
  Administered 2016-09-23 – 2016-09-25 (×3): 40 mg via INTRAVENOUS
  Filled 2016-09-23 (×3): qty 4

## 2016-09-23 MED ORDER — SODIUM CHLORIDE 0.9% FLUSH
3.0000 mL | Freq: Two times a day (BID) | INTRAVENOUS | Status: DC
  Administered 2016-09-23 – 2016-09-26 (×6): 3 mL via INTRAVENOUS

## 2016-09-23 MED ORDER — ONDANSETRON HCL 4 MG/2ML IJ SOLN
4.0000 mg | Freq: Four times a day (QID) | INTRAMUSCULAR | Status: DC | PRN
  Administered 2016-09-23: 4 mg via INTRAVENOUS
  Filled 2016-09-23: qty 2

## 2016-09-23 MED ORDER — ONDANSETRON HCL 4 MG PO TABS
4.0000 mg | ORAL_TABLET | Freq: Four times a day (QID) | ORAL | Status: DC | PRN
Start: 2016-09-23 — End: 2016-09-26

## 2016-09-23 MED ORDER — PIPERACILLIN-TAZOBACTAM 3.375 G IVPB
3.3750 g | Freq: Three times a day (TID) | INTRAVENOUS | Status: DC
  Administered 2016-09-23 – 2016-09-26 (×10): 3.375 g via INTRAVENOUS
  Filled 2016-09-23 (×10): qty 50

## 2016-09-23 MED ORDER — GUAIFENESIN-DM 100-10 MG/5ML PO SYRP
5.0000 mL | ORAL_SOLUTION | ORAL | Status: DC | PRN
  Administered 2016-09-23 – 2016-09-24 (×3): 5 mL via ORAL
  Filled 2016-09-23 (×3): qty 5

## 2016-09-23 MED ORDER — INSULIN ASPART 100 UNIT/ML ~~LOC~~ SOLN: 0.0000 [IU] | Freq: Every day | SUBCUTANEOUS | Status: DC

## 2016-09-23 MED ORDER — LOSARTAN POTASSIUM 50 MG PO TABS
100.0000 mg | ORAL_TABLET | Freq: Every day | ORAL | Status: DC
Start: 2016-09-23 — End: 2016-09-26
  Administered 2016-09-23 – 2016-09-26 (×4): 100 mg via ORAL
  Filled 2016-09-23 (×4): qty 2

## 2016-09-23 MED ORDER — BENZONATATE 100 MG PO CAPS
200.0000 mg | ORAL_CAPSULE | Freq: Three times a day (TID) | ORAL | Status: DC | PRN
Start: 2016-09-23 — End: 2016-09-26
  Administered 2016-09-23 – 2016-09-24 (×4): 200 mg via ORAL
  Filled 2016-09-23 (×4): qty 2

## 2016-09-23 MED ORDER — FLUTICASONE FUROATE-VILANTEROL 100-25 MCG/INH IN AEPB
1.0000 | INHALATION_SPRAY | Freq: Every day | RESPIRATORY_TRACT | Status: DC
  Administered 2016-09-23 – 2016-09-26 (×4): 1 via RESPIRATORY_TRACT
  Filled 2016-09-23: qty 28

## 2016-09-23 MED ORDER — ENOXAPARIN SODIUM 40 MG/0.4ML ~~LOC~~ SOLN
40.0000 mg | SUBCUTANEOUS | Status: DC
  Administered 2016-09-23 – 2016-09-26 (×4): 40 mg via SUBCUTANEOUS
  Filled 2016-09-23 (×4): qty 0.4

## 2016-09-23 MED ORDER — INSULIN ASPART 100 UNIT/ML ~~LOC~~ SOLN
0.0000 [IU] | Freq: Three times a day (TID) | SUBCUTANEOUS | Status: DC
  Administered 2016-09-23 (×2): 1 [IU] via SUBCUTANEOUS
  Administered 2016-09-23 – 2016-09-24 (×3): 2 [IU] via SUBCUTANEOUS
  Administered 2016-09-24: 1 [IU] via SUBCUTANEOUS
  Administered 2016-09-25: 2 [IU] via SUBCUTANEOUS
  Administered 2016-09-25 – 2016-09-26 (×3): 1 [IU] via SUBCUTANEOUS
  Filled 2016-09-23 (×2): qty 1
  Filled 2016-09-23 (×2): qty 2
  Filled 2016-09-23: qty 1

## 2016-09-23 MED ORDER — VANCOMYCIN HCL IN DEXTROSE 1-5 GM/200ML-% IV SOLN
1000.0000 mg | Freq: Two times a day (BID) | INTRAVENOUS | Status: DC
  Administered 2016-09-23: 1000 mg via INTRAVENOUS
  Filled 2016-09-23 (×2): qty 200

## 2016-09-23 MED ORDER — ASPIRIN 325 MG PO TABS
325.0000 mg | ORAL_TABLET | Freq: Every day | ORAL | Status: DC
  Administered 2016-09-23 – 2016-09-26 (×4): 325 mg via ORAL
  Filled 2016-09-23 (×4): qty 1

## 2016-09-23 MED ORDER — ACETAMINOPHEN 650 MG RE SUPP: 650.0000 mg | Freq: Four times a day (QID) | RECTAL | Status: DC | PRN

## 2016-09-23 MED ORDER — IPRATROPIUM-ALBUTEROL 0.5-2.5 (3) MG/3ML IN SOLN
3.0000 mL | RESPIRATORY_TRACT | Status: DC | PRN
Start: 2016-09-23 — End: 2016-09-26
  Administered 2016-09-24 (×2): 3 mL via RESPIRATORY_TRACT
  Filled 2016-09-23 (×2): qty 3

## 2016-09-23 MED ORDER — SIMVASTATIN 20 MG PO TABS
10.0000 mg | ORAL_TABLET | Freq: Every day | ORAL | Status: DC
  Administered 2016-09-23 – 2016-09-25 (×3): 10 mg via ORAL
  Filled 2016-09-23 (×2): qty 1

## 2016-09-23 MED ORDER — ACETAMINOPHEN 325 MG PO TABS
650.0000 mg | ORAL_TABLET | Freq: Four times a day (QID) | ORAL | Status: DC | PRN
  Administered 2016-09-23 – 2016-09-24 (×5): 650 mg via ORAL
  Filled 2016-09-23 (×5): qty 2

## 2016-09-23 MED ORDER — OSELTAMIVIR PHOSPHATE 75 MG PO CAPS
75.0000 mg | ORAL_CAPSULE | Freq: Two times a day (BID) | ORAL | Status: DC
  Administered 2016-09-23 – 2016-09-24 (×3): 75 mg via ORAL
  Filled 2016-09-23 (×3): qty 1

## 2016-09-23 MED ORDER — CARVEDILOL 25 MG PO TABS
25.0000 mg | ORAL_TABLET | Freq: Two times a day (BID) | ORAL | Status: DC
  Administered 2016-09-23 – 2016-09-26 (×7): 25 mg via ORAL
  Filled 2016-09-23 (×7): qty 1

## 2016-09-23 NOTE — Progress Notes (Signed)
Pharmacy Antibiotic Note  Chris Wilkinson is a 76 y.o. male admitted on 09/22/2016 with pneumonia and sepsis.  Pharmacy has been consulted for vancomycin and Zosyn dosing.  Plan: Vancomycin 1000 IV every 12 hours with stacked dosing. Will adjust dosing/check levels as indicated to achieve a goal trough of 15-20 mcg/mL. Zosyn 3.375g IV q8h (4 hour infusion).  MRSA PCR and PCT ordered.   Height: '5\' 11"'$  (180.3 cm) Weight: 215 lb 11.2 oz (97.8 kg) IBW/kg (Calculated) : 75.3  Temp (24hrs), Avg:100.7 F (38.2 C), Min:99.3 F (37.4 C), Max:102.6 F (39.2 C)   Recent Labs Lab 09/22/16 2134  WBC 10.4  CREATININE 1.22  LATICACIDVEN 1.5    Estimated Creatinine Clearance: 63.3 mL/min (by C-G formula based on SCr of 1.22 mg/dL).    No Known Allergies  Antimicrobials this admission: CTX 3/12 x 1 azithromycin 3/12 x 1  Vancomycin 3/13 >> Zosyn 3/13 >>  Dose adjustments this admission:   Microbiology results: 3/12 BCx: pending 3/12 UCx: pending  Sputum: pending  MRSA PCR: pending  Thank you for allowing pharmacy to be a part of this patient's care.  Ulice Dash D 09/23/2016 1:12 AM

## 2016-09-23 NOTE — Progress Notes (Signed)
[facility] [place] NAME: Keland Peyton    MR#:  [id number]  DATE OF BIRTH:  [birth year]  SUBJECTIVE:  CHIEF COMPLAINT:   Chief Complaint  Patient presents with  . Fever   -Admitted with fevers and noted to have pneumonia and influenza illness. -No fevers this morning. Feeling better. Still has cough and dyspnea.  REVIEW OF SYSTEMS:  Review of Systems  Constitutional: Positive for malaise/fatigue. Negative for chills and fever.  HENT: Negative for congestion, ear discharge, hearing loss and nosebleeds.   Eyes: Negative for blurred vision.  Respiratory: Positive for cough and shortness of breath. Negative for wheezing.   Cardiovascular: Negative for chest pain, palpitations and leg swelling.  Gastrointestinal: Negative for abdominal pain, constipation, diarrhea, nausea and vomiting.  Genitourinary: Negative for dysuria.  Musculoskeletal: Negative for myalgias and neck pain.  Neurological: Negative for dizziness, sensory change, speech change, focal weakness, seizures and headaches.  Psychiatric/Behavioral: Negative for depression.    DRUG ALLERGIES:  No Known Allergies  VITALS:  Blood pressure (!) 119/59, pulse 96, temperature 98.4 F (36.9 C), temperature source Oral, resp. rate 18, height '5\' 11"'$  (1.803 m), weight 97.8 kg (215 lb 11.2 oz), SpO2 94 %.  PHYSICAL EXAMINATION:  Physical Exam  GENERAL:  76 y.o.-year-old patient lying in the bed with no acute distress.  EYES: Pupils equal, round, reactive to light and accommodation. No scleral icterus. Extraocular muscles intact.  HEENT: Head atraumatic, normocephalic. Oropharynx and nasopharynx clear.  NECK:  Supple, no jugular venous distention. No thyroid enlargement, no tenderness.  LUNGS: Normal breath sounds bilaterallyBut occasional scattered rhonchi posteriorly heard, no wheezing, rales  or crepitation. No use of accessory muscles of respiration.  CARDIOVASCULAR: S1, S2  normal. No murmurs, rubs, or gallops.  ABDOMEN: Soft, nontender, nondistended. Bowel sounds present. No organomegaly or mass.  EXTREMITIES: No pedal edema, cyanosis, or clubbing.  NEUROLOGIC: Cranial nerves II through XII are intact. Muscle strength 5/5 in all extremities. Sensation intact. Gait not checked.  PSYCHIATRIC: The patient is alert and oriented x 3.  SKIN: No obvious rash, lesion, or ulcer.    LABORATORY PANEL:   CBC  Recent Labs Lab 09/23/16 0316  WBC 8.7  HGB 14.3  HCT 42.5  PLT 132*   ------------------------------------------------------------------------------------------------------------------  Chemistries   Recent Labs Lab 09/22/16 2134 09/23/16 0316  NA 137 140  K 3.7 4.4  CL 106 107  CO2 24 28  GLUCOSE 176* 175*  BUN 12 12  CREATININE 1.22 1.28*  CALCIUM 9.5 9.6  AST 20  --   ALT 19  --   ALKPHOS 101  --   BILITOT 0.5  --    ------------------------------------------------------------------------------------------------------------------  Cardiac Enzymes No results for input(s): TROPONINI in the last 168 hours. ------------------------------------------------------------------------------------------------------------------  RADIOLOGY:  Dg Chest 2 View  Result Date: 09/22/2016 CLINICAL DATA:  76 y/o  M; shortness of breath. EXAM: CHEST  2 VIEW COMPARISON:  09/08/2013 CT of the chest. FINDINGS: Severe emphysema with bullous changes of the left lung are stable. Increased prominence of interstitial markings best appreciated in the right lung from prior radiographs. No consolidation. No pleural effusion. Stable stable mild cardiomegaly. Moderate degenerative changes of the spine. IMPRESSION: Severe bullous emphysema. Stable mild cardiomegaly. Increase interstitial markings best appreciated in right lung may represent interval development of interstitial edema or atypical pneumonia. Electronically Signed   By: Kristine Garbe M.D.   On:  09/22/2016 22:02    EKG:   Orders placed or  performed during the hospital encounter of 09/08/16  . ED EKG within 10 minutes  . ED EKG within 10 minutes  . 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  #1 Sepsis on admission- resolving, secondary to pneumonia - blood cultures pending - d/c vancomycin, on zosyn for now - CXR with pneumonitis and pulm edema - lasix ordered, check ECHO to r/o cardiac dysfunction  #2 Influenza illness- fevers better today, on tamiflu - continue droplet isolation  #3 CAD- stable, on asa, coreg, statin and losartan  #4 DM- metformin on hold, SSI for now  #5 DVT Prophylaxis- lovenox  Independent at baseline, feels very weak- physical Therapy consulted.   All the records are reviewed and case discussed with Care Management/Social Workerr. Management plans discussed with the patient, family and they are in agreement.  CODE STATUS: Full code  TOTAL TIME TAKING CARE OF THIS PATIENT: 38 minutes.   POSSIBLE D/C TOMORROW, DEPENDING ON CLINICAL CONDITION.   Gladstone Lighter M.D on 09/23/2016 at 1:41 PM  Between 7am to 6pm - Pager - [phone]  After 6pm go to [email or web address] - password [email or web address] [facility]  Office  [phone]  CC: Primary care physician; Nino Glow McLean-Scocuzza, MD

## 2016-09-23 NOTE — ED Notes (Signed)
Called lab about flu results and it not showing up in results. Lab made aware.

## 2016-09-23 NOTE — Progress Notes (Signed)
PT Cancellation Note  Patient Details Name: Chris Wilkinson MRN: [id number] DOB: 03-Jun-1941   Cancelled Treatment:    Reason Eval/Treat Not Completed: Other (comment).  Per RN pt has been up to the bathroom and walking around his room without assist and without instability and is independent with mobility. PT will sign off at this time.  Please place new PT order if status changes.   Collie Siad PT, DPT 09/23/2016, 3:44 PM

## 2016-09-24 ENCOUNTER — Encounter: Payer: Self-pay | Admitting: *Deleted

## 2016-09-24 LAB — CBC
HEMATOCRIT: 42.1 % (ref 40.0–52.0)
HEMOGLOBIN: 14.2 g/dL (ref 13.0–18.0)
MCH: 29.8 pg (ref 26.0–34.0)
MCHC: 33.6 g/dL (ref 32.0–36.0)
MCV: 88.6 fL (ref 80.0–100.0)
Platelets: 118 10*3/uL — ABNORMAL LOW (ref 150–440)
RBC: 4.76 MIL/uL (ref 4.40–5.90)
RDW: 15.5 % — AB (ref 11.5–14.5)
WBC: 10.5 10*3/uL (ref 3.8–10.6)

## 2016-09-24 LAB — BASIC METABOLIC PANEL
ANION GAP: 8 (ref 5–15)
BUN: 17 mg/dL (ref 6–20)
CALCIUM: 9.4 mg/dL (ref 8.9–10.3)
CO2: 27 mmol/L (ref 22–32)
Chloride: 104 mmol/L (ref 101–111)
Creatinine, Ser: 1.64 mg/dL — ABNORMAL HIGH (ref 0.61–1.24)
GFR calc Af Amer: 46 mL/min — ABNORMAL LOW (ref >60)
GFR, EST NON AFRICAN AMERICAN: 40 mL/min — AB (ref >60)
Glucose, Bld: 160 mg/dL — ABNORMAL HIGH (ref 65–99)
POTASSIUM: 3.5 mmol/L (ref 3.5–5.1)
Sodium: 139 mmol/L (ref 135–145)

## 2016-09-24 LAB — GLUCOSE, CAPILLARY
GLUCOSE-CAPILLARY: 181 mg/dL — AB (ref 65–99)
GLUCOSE-CAPILLARY: 185 mg/dL — AB (ref 65–99)
Glucose-Capillary: 146 mg/dL — ABNORMAL HIGH (ref 65–99)
Glucose-Capillary: 148 mg/dL — ABNORMAL HIGH (ref 65–99)

## 2016-09-24 LAB — URINE CULTURE: CULTURE: NO GROWTH

## 2016-09-24 LAB — ECHOCARDIOGRAM COMPLETE
Height: 71 in
Weight: 3451.2 oz

## 2016-09-24 MED ORDER — MENTHOL 3 MG MT LOZG
1.0000 | LOZENGE | OROMUCOSAL | Status: DC | PRN
  Administered 2016-09-24: 3 mg via ORAL
  Filled 2016-09-24 (×4): qty 9

## 2016-09-24 MED ORDER — SODIUM CHLORIDE 0.9 % IV SOLN
INTRAVENOUS | Status: DC
  Administered 2016-09-24: 13:00:00 via INTRAVENOUS
  Administered 2016-09-24: 1000 mL via INTRAVENOUS
  Administered 2016-09-25 (×2): via INTRAVENOUS

## 2016-09-24 MED ORDER — OSELTAMIVIR PHOSPHATE 30 MG PO CAPS
30.0000 mg | ORAL_CAPSULE | Freq: Two times a day (BID) | ORAL | Status: DC
  Administered 2016-09-24 – 2016-09-26 (×4): 30 mg via ORAL
  Filled 2016-09-24 (×4): qty 1

## 2016-09-24 MED ORDER — PHENOL 1.4 % MT LIQD
1.0000 | OROMUCOSAL | Status: DC | PRN
  Filled 2016-09-24 (×3): qty 177

## 2016-09-24 MED ORDER — FLUTICASONE PROPIONATE 50 MCG/ACT NA SUSP
1.0000 | Freq: Every day | NASAL | Status: DC
  Administered 2016-09-24 – 2016-09-26 (×3): 1 via NASAL
  Filled 2016-09-24: qty 16

## 2016-09-24 MED ORDER — IBUPROFEN 400 MG PO TABS
400.0000 mg | ORAL_TABLET | Freq: Three times a day (TID) | ORAL | Status: DC | PRN
  Administered 2016-09-24: 400 mg via ORAL
  Filled 2016-09-24: qty 1

## 2016-09-24 NOTE — Progress Notes (Signed)
[place] at [place] NAME: Chris Wilkinson    MR#:  [id number]  DATE OF BIRTH:  1940-07-25  SUBJECTIVE:  CHIEF COMPLAINT:   Chief Complaint  Patient presents with  . Fever   -Admitted with fevers and spiked Fever again today a.m. at around 3:00 noted to have pneumonia and influenza illness. - Still has cough and dyspnea. Wife is concerned as patient is febrile again  REVIEW OF SYSTEMS:  Review of Systems  Constitutional: Positive for malaise/fatigue. Negative for chills and fever.  HENT: Negative for congestion, ear discharge, hearing loss and nosebleeds.   Eyes: Negative for blurred vision.  Respiratory: Positive for cough and shortness of breath. Negative for wheezing.   Cardiovascular: Negative for chest pain, palpitations and leg swelling.  Gastrointestinal: Negative for abdominal pain, constipation, diarrhea, nausea and vomiting.  Genitourinary: Negative for dysuria.  Musculoskeletal: Negative for myalgias and neck pain.  Neurological: Negative for dizziness, sensory change, speech change, focal weakness, seizures and headaches.  Psychiatric/Behavioral: Negative for depression.    DRUG ALLERGIES:  No Known Allergies  VITALS:  Blood pressure (!) 154/94, pulse (!) 113, temperature 98.7 F (37.1 C), temperature source Oral, resp. rate 20, height '5\' 11"'$  (1.803 m), weight 97.8 kg (215 lb 11.2 oz), SpO2 94 %.  PHYSICAL EXAMINATION:  Physical Exam  GENERAL:  76 y.o.-year-old patient lying in the bed with no acute distress.  EYES: Pupils equal, round, reactive to light and accommodation. No scleral icterus. Extraocular muscles intact.  HEENT: Head atraumatic, normocephalic. Oropharynx and nasopharynx clear.  NECK:  Supple, no jugular venous distention. No thyroid enlargement, no tenderness.  LUNGS: Normal breath sounds bilaterallyBut occasional scattered rhonchi posteriorly heard, no wheezing, rales  or crepitation. No use of  accessory muscles of respiration.  CARDIOVASCULAR: S1, S2 normal. No murmurs, rubs, or gallops.  ABDOMEN: Soft, nontender, nondistended. Bowel sounds present. No organomegaly or mass.  EXTREMITIES: No pedal edema, cyanosis, or clubbing.  NEUROLOGIC: Cranial nerves II through XII are intact. Muscle strength 5/5 in all extremities. Sensation intact. Gait not checked.  PSYCHIATRIC: The patient is alert and oriented x 3.  SKIN: No obvious rash, lesion, or ulcer.    LABORATORY PANEL:   CBC  Recent Labs Lab 09/24/16 0554  WBC 10.5  HGB 14.2  HCT 42.1  PLT 118*   ------------------------------------------------------------------------------------------------------------------  Chemistries   Recent Labs Lab 09/22/16 2134  09/24/16 0554  NA 137  < > 139  K 3.7  < > 3.5  CL 106  < > 104  CO2 24  < > 27  GLUCOSE 176*  < > 160*  BUN 12  < > 17  CREATININE 1.22  < > 1.64*  CALCIUM 9.5  < > 9.4  AST 20  --   --   ALT 19  --   --   ALKPHOS 101  --   --   BILITOT 0.5  --   --   < > = values in this interval not displayed. ------------------------------------------------------------------------------------------------------------------  Cardiac Enzymes No results for input(s): TROPONINI in the last 168 hours. ------------------------------------------------------------------------------------------------------------------  RADIOLOGY:  Dg Chest 2 View  Result Date: 09/22/2016 CLINICAL DATA:  76 y/o  M; shortness of breath. EXAM: CHEST  2 VIEW COMPARISON:  09/08/2013 CT of the chest. FINDINGS: Severe emphysema with bullous changes of the left lung are stable. Increased prominence of interstitial markings best appreciated in the right lung from prior radiographs. No consolidation. No pleural effusion. Stable stable  mild cardiomegaly. Moderate degenerative changes of the spine. IMPRESSION: Severe bullous emphysema. Stable mild cardiomegaly. Increase interstitial markings best  appreciated in right lung may represent interval development of interstitial edema or atypical pneumonia. Electronically Signed   By: Kristine Garbe M.D.   On: 09/22/2016 22:02    EKG:   Orders placed or performed during the hospital encounter of 09/08/16  . ED EKG within 10 minutes  . ED EKG within 10 minutes  . 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  #1 Sepsis on admission- resolving, secondary to pneumonia - blood cultures Negative for 2 days - d/c vancomycin, on zosyn for now - CXR with pneumonitis and pulm edema - lasix ordered - ECHO done results are pending   #2 Influenza illness-  on tamiflu - continue droplet isolation  #Acute kidney injury holding metformin and provide IV fluids and avoid nephrotoxins repeat a.m. Labs Creatinine 1.28--1.64  #3 CAD- stable, on asa, coreg, statin and losartan  #4 DM- metformin on hold, SSI for now  #5 DVT Prophylaxis- lovenox  Independent at baseline, feels very weak- physical Therapy consulted.   All the records are reviewed and case discussed with Care Management/Social Workerr. Management plans discussed with the patient, family and they are in agreement.  CODE STATUS: Full code  TOTAL TIME TAKING CARE OF THIS PATIENT: 38 minutes.   POSSIBLE D/C TOMORROW, DEPENDING ON CLINICAL CONDITION.   [name] M.D on 09/24/2016 at 4:45 PM  Between 7am to 6pm - Pager - [phone]  After 6pm go to [email or web address] - password [email or web address] [facility]  Office  [phone]  CC: Primary care physician; Nino Glow McLean-Scocuzza, MD

## 2016-09-24 NOTE — Progress Notes (Signed)
Patient noted, sitting in bedside chair, with respiratory distress SOB and saturation 86%, fever 102.7 orally, and sinus tach HR132. PRN tylenol administer, cool pack applied, and room cooled. MD phoned and updated. New orders received for IVF NS '@50'$ /hr, motrin, and throat lozenges. Patient's VS trending to normal and patient reports "feeling better". Wife mary at bedside. Will continue to monitor and endorse.

## 2016-09-24 NOTE — Progress Notes (Signed)
PHARMACY NOTE:  ANTIMICROBIAL RENAL DOSAGE ADJUSTMENT  Current antimicrobial regimen includes a mismatch between antimicrobial dosage and estimated renal function.  As per policy approved by the Pharmacy & Therapeutics and Medical Executive Committees, the antimicrobial dosage will be adjusted accordingly.  Current antimicrobial dosage:  Tamiflu '75mg'$  bid  Indication: influenza A  Renal Function:  Estimated Creatinine Clearance: 47.1 mL/min (by C-G formula based on SCr of 1.64 mg/dL (H)). '[]'$      On intermittent HD, scheduled: '[]'$      On CRRT    Antimicrobial dosage has been changed to:  Tamiflu '30mg'$  bid  Additional comments:   Thank you for allowing pharmacy to be a part of this patient's care.  Paulina Fusi, PharmD, BCPS 09/24/2016 1:13 PM

## 2016-09-24 NOTE — Progress Notes (Signed)
Patient responded to treatment, temp is 99.1. Attempted to remove 2 nasal cannula applied acutely, however, patient's sats decreased. Patient is now sitting in bedside chair and reports "feeling much better". Wife is present.  Endorse to [employer].

## 2016-09-25 ENCOUNTER — Inpatient Hospital Stay: Payer: Medicare Other

## 2016-09-25 ENCOUNTER — Encounter: Payer: Self-pay | Admitting: General Practice

## 2016-09-25 LAB — PROCALCITONIN: PROCALCITONIN: 5.01 ng/mL

## 2016-09-25 LAB — BASIC METABOLIC PANEL
Anion gap: 8 (ref 5–15)
BUN: 28 mg/dL — AB (ref 6–20)
CHLORIDE: 104 mmol/L (ref 101–111)
CO2: 27 mmol/L (ref 22–32)
CREATININE: 1.7 mg/dL — AB (ref 0.61–1.24)
Calcium: 9.7 mg/dL (ref 8.9–10.3)
GFR calc Af Amer: 44 mL/min — ABNORMAL LOW (ref >60)
GFR calc non Af Amer: 38 mL/min — ABNORMAL LOW (ref >60)
GLUCOSE: 129 mg/dL — AB (ref 65–99)
Potassium: 4.5 mmol/L (ref 3.5–5.1)
Sodium: 139 mmol/L (ref 135–145)

## 2016-09-25 LAB — EXPECTORATED SPUTUM ASSESSMENT W GRAM STAIN, RFLX TO RESP C: Special Requests: NORMAL

## 2016-09-25 LAB — GLUCOSE, CAPILLARY
GLUCOSE-CAPILLARY: 137 mg/dL — AB (ref 65–99)
GLUCOSE-CAPILLARY: 116 mg/dL — AB (ref 65–99)
Glucose-Capillary: 127 mg/dL — ABNORMAL HIGH (ref 65–99)
Glucose-Capillary: 154 mg/dL — ABNORMAL HIGH (ref 65–99)

## 2016-09-25 LAB — PLATELET COUNT: Platelets: 110 10*3/uL — ABNORMAL LOW (ref 150–440)

## 2016-09-25 NOTE — Progress Notes (Signed)
[facility] at [place] NAME: Chris Wilkinson    MR#:  [id number]  DATE OF BIRTH:  07-Jun-1941  SUBJECTIVE:  CHIEF COMPLAINT:   Chief Complaint  Patient presents with  . Fever   -Admitted with fevers and Afebrile in the past 24 hours. Clinically feeling better- Still has cough  REVIEW OF SYSTEMS:  Review of Systems  Constitutional: Positive for malaise/fatigue. Negative for chills and fever.  HENT: Negative for congestion, ear discharge, hearing loss and nosebleeds.   Eyes: Negative for blurred vision.  Respiratory: Positive for cough. Negative for shortness of breath and wheezing.   Cardiovascular: Negative for chest pain, palpitations and leg swelling.  Gastrointestinal: Negative for abdominal pain, constipation, diarrhea, nausea and vomiting.  Genitourinary: Negative for dysuria.  Musculoskeletal: Negative for myalgias and neck pain.  Neurological: Negative for dizziness, sensory change, speech change, focal weakness, seizures and headaches.  Psychiatric/Behavioral: Negative for depression.    DRUG ALLERGIES:  No Known Allergies  VITALS:  Blood pressure (!) 115/57, pulse 87, temperature 98.2 F (36.8 C), temperature source Oral, resp. rate 18, height '5\' 11"'$  (1.803 m), weight 97.8 kg (215 lb 11.2 oz), SpO2 98 %.  PHYSICAL EXAMINATION:  Physical Exam  GENERAL:  76 y.o.-year-old patient lying in the bed with no acute distress.  EYES: Pupils equal, round, reactive to light and accommodation. No scleral icterus. Extraocular muscles intact.  HEENT: Head atraumatic, normocephalic. Oropharynx and nasopharynx clear.  NECK:  Supple, no jugular venous distention. No thyroid enlargement, no tenderness.  LUNGS: Normal breath sounds bilaterallyBut occasional scattered rhonchi posteriorly heard, no wheezing, rales  or crepitation. No use of accessory muscles of respiration.  CARDIOVASCULAR: S1, S2 normal. No murmurs, rubs, or gallops.  ABDOMEN:  Soft, nontender, nondistended. Bowel sounds present. No organomegaly or mass.  EXTREMITIES: No pedal edema, cyanosis, or clubbing.  NEUROLOGIC: Cranial nerves II through XII are intact. Muscle strength 5/5 in all extremities. Sensation intact. Gait not checked.  PSYCHIATRIC: The patient is alert and oriented x 3.  SKIN: No obvious rash, lesion, or ulcer.    LABORATORY PANEL:   CBC  Recent Labs Lab 09/24/16 0554 09/25/16 0434  WBC 10.5  --   HGB 14.2  --   HCT 42.1  --   PLT 118* 110*   ------------------------------------------------------------------------------------------------------------------  Chemistries   Recent Labs Lab 09/22/16 2134  09/25/16 0434  NA 137  < > 139  K 3.7  < > 4.5  CL 106  < > 104  CO2 24  < > 27  GLUCOSE 176*  < > 129*  BUN 12  < > 28*  CREATININE 1.22  < > 1.70*  CALCIUM 9.5  < > 9.7  AST 20  --   --   ALT 19  --   --   ALKPHOS 101  --   --   BILITOT 0.5  --   --   < > = values in this interval not displayed. ------------------------------------------------------------------------------------------------------------------  Cardiac Enzymes No results for input(s): TROPONINI in the last 168 hours. ------------------------------------------------------------------------------------------------------------------  RADIOLOGY:  No results found.  EKG:   Orders placed or performed during the hospital encounter of 09/08/16  . ED EKG within 10 minutes  . ED EKG within 10 minutes  . 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  #1 Sepsis on admission- resolving, secondary to pneumonia - blood cultures Negative for 2 days - d/c vancomycin, on zosyn for now - CXR with pneumonitis and pulm edema -Initial sputum sample  was not usable so repeat sputum culture ordered - lasx discontinued as renal function is getting worse - ECHO -Ejection fraction 55-60% -pro calcitonin is trending up 0.13-5.01, consult infectious disease and repeat chest x-ray  #2 Influenza illness-  on tamiflu - continue droplet isolation  #Acute kidney injury holding metformin and provide IV fluids and avoid nephrotoxins repeat a.m. Labs Creatinine 1.28--1.64--1. 7 repeat BMP in a.m. avoid nephrotoxins  #3 CAD- stable, on asa, coreg, statin and losartan  #4 DM- metformin on hold, SSI for now  #5 DVT Prophylaxis- lovenox  Independent at baseline, feels very weak- physical Therapy consulted.   All the records are reviewed and case discussed with Care Management/Social Workerr. Management plans discussed with the patient, family and they are in agreement.  CODE STATUS: Full code  TOTAL TIME TAKING CARE OF THIS PATIENT: 36 minutes.   POSSIBLE D/C TOMORROW, DEPENDING ON CLINICAL CONDITION.   [name] M.D on 09/25/2016 at 5:40 PM  Between 7am to 6pm - Pager - [phone]  After 6pm go to [email or web address] - password [email or web address] [facility]  Office  [phone]  CC: Primary care physician; Chris Glow McLean-Scocuzza, MD

## 2016-09-25 NOTE — Progress Notes (Signed)
Patient continues with SOB, appetite decreased. Noted patient to be leaning over bedside table, reinforced pursed lip breathing.  MD aware. Patient states he feels better than he did a couple of days.Monitoring in place.

## 2016-09-25 NOTE — Progress Notes (Signed)
This Probation officer assisted pt with walking to the bathroom, pt walked to bathroom on room air, told this Probation officer he felt ok without his O2, and that the doctor had just given him a brand new machine for O2 at home that he hasn't even used, said he tried to tell the doctor that he didn't need it, figures the doctor wanted him to have it anyway.

## 2016-09-26 ENCOUNTER — Inpatient Hospital Stay: Payer: Medicare Other

## 2016-09-26 LAB — CBC
HEMATOCRIT: 40.9 % (ref 40.0–52.0)
HEMOGLOBIN: 13.5 g/dL (ref 13.0–18.0)
MCH: 28.7 pg (ref 26.0–34.0)
MCHC: 32.9 g/dL (ref 32.0–36.0)
MCV: 87.2 fL (ref 80.0–100.0)
Platelets: 126 10*3/uL — ABNORMAL LOW (ref 150–440)
RBC: 4.7 MIL/uL (ref 4.40–5.90)
RDW: 15.4 % — ABNORMAL HIGH (ref 11.5–14.5)
WBC: 7.8 10*3/uL (ref 3.8–10.6)

## 2016-09-26 LAB — BASIC METABOLIC PANEL
Anion gap: 8 (ref 5–15)
BUN: 32 mg/dL — AB (ref 6–20)
CHLORIDE: 106 mmol/L (ref 101–111)
CO2: 28 mmol/L (ref 22–32)
Calcium: 9.7 mg/dL (ref 8.9–10.3)
Creatinine, Ser: 1.42 mg/dL — ABNORMAL HIGH (ref 0.61–1.24)
GFR calc Af Amer: 55 mL/min — ABNORMAL LOW (ref >60)
GFR calc non Af Amer: 47 mL/min — ABNORMAL LOW (ref >60)
Glucose, Bld: 119 mg/dL — ABNORMAL HIGH (ref 65–99)
POTASSIUM: 4 mmol/L (ref 3.5–5.1)
SODIUM: 142 mmol/L (ref 135–145)

## 2016-09-26 LAB — PROCALCITONIN: PROCALCITONIN: 3.56 ng/mL

## 2016-09-26 LAB — GLUCOSE, CAPILLARY: Glucose-Capillary: 124 mg/dL — ABNORMAL HIGH (ref 65–99)

## 2016-09-26 MED ORDER — OSELTAMIVIR PHOSPHATE 30 MG PO CAPS: 30.0000 mg | ORAL_CAPSULE | Freq: Two times a day (BID) | ORAL | 0 refills | Status: DC

## 2016-09-26 MED ORDER — AMOXICILLIN-POT CLAVULANATE 875-125 MG PO TABS: 1.0000 | ORAL_TABLET | Freq: Two times a day (BID) | ORAL | 0 refills | Status: DC

## 2016-09-26 MED ORDER — ACETAMINOPHEN 325 MG PO TABS: 650.0000 mg | ORAL_TABLET | Freq: Four times a day (QID) | ORAL | Status: AC | PRN

## 2016-09-26 MED ORDER — BENZONATATE 200 MG PO CAPS: 200.0000 mg | ORAL_CAPSULE | Freq: Three times a day (TID) | ORAL | 0 refills | Status: DC | PRN

## 2016-09-26 NOTE — Progress Notes (Signed)
Patient discharge summary reviewed with patient and spouse with verbal understanding. Rx and belongings given upon discharge. Oxygen tank delivered by [facility]. Answered all questions. Escorted to personal vehicle via wc.

## 2016-09-26 NOTE — Progress Notes (Signed)
ID brief note Spoke with Dr Margaretmary Eddy regarding elevated Procalcitonin. Reviewed CXR and suggested CT chest which shows mass and post obs pna  Rec Discuss with pulmonary and onc Would change to oral augmentin at dc for the PNA for 14 days total

## 2016-09-26 NOTE — Care Management Important Message (Signed)
Important Message  Patient Details  Name: Chris Wilkinson MRN: [id number] Date of Birth: [birth year]   Medicare Important Message Given:  Yes    Jolly Mango, RN 09/26/2016, 3:48 PM

## 2016-09-26 NOTE — Care Management (Signed)
Patient states he is on O2 at 2l nocturnally. He does not have a nebulizer.

## 2016-09-26 NOTE — Progress Notes (Signed)
[place] at [place] NAME: Chris Wilkinson    MR#:  [id number]  DATE OF BIRTH:  09-10-1940  SUBJECTIVE:  CHIEF COMPLAINT:   Chief Complaint  Patient presents with  . Fever   -Admitted with fevers and Afebrile in the past 24 hours. Clinically feeling better- Still has cough  Persistent weakness REVIEW OF SYSTEMS:  Review of Systems  Constitutional: Positive for malaise/fatigue. Negative for chills and fever.  HENT: Negative for congestion, ear discharge, hearing loss and nosebleeds.   Eyes: Negative for blurred vision.  Respiratory: Positive for cough. Negative for shortness of breath and wheezing.   Cardiovascular: Negative for chest pain, palpitations and leg swelling.  Gastrointestinal: Negative for abdominal pain, constipation, diarrhea, nausea and vomiting.  Genitourinary: Negative for dysuria.  Musculoskeletal: Negative for myalgias and neck pain.  Neurological: Negative for dizziness, sensory change, speech change, focal weakness, seizures and headaches.  Psychiatric/Behavioral: Negative for depression.    DRUG ALLERGIES:  No Known Allergies  VITALS:  Blood pressure 123/65, pulse 96, temperature 98.4 F (36.9 C), temperature source Oral, resp. rate (!) 26, height '5\' 11"'$  (1.803 m), weight 97.8 kg (215 lb 11.2 oz), SpO2 98 %.  PHYSICAL EXAMINATION:  Physical Exam  GENERAL:  76 y.o.-year-old patient lying in the bed with no acute distress.  EYES: Pupils equal, round, reactive to light and accommodation. No scleral icterus. Extraocular muscles intact.  HEENT: Head atraumatic, normocephalic. Oropharynx and nasopharynx clear.  NECK:  Supple, no jugular venous distention. No thyroid enlargement, no tenderness.  LUNGS: Moderate breath sounds bilaterallyBut occasional scattered rhonchi posteriorly heard, no wheezing, rales  or crepitation. No use of accessory muscles of respiration.  CARDIOVASCULAR: S1, S2 normal. No murmurs, rubs,  or gallops.  ABDOMEN: Soft, nontender, nondistended. Bowel sounds present. No organomegaly or mass.  EXTREMITIES: No pedal edema, cyanosis, or clubbing.  NEUROLOGIC: Cranial nerves II through XII are intact. Muscle strength 5/5 in all extremities. Sensation intact. Gait not checked.  PSYCHIATRIC: The patient is alert and oriented x 3.  SKIN: No obvious rash, lesion, or ulcer.    LABORATORY PANEL:   CBC  Recent Labs Lab 09/26/16 0319  WBC 7.8  HGB 13.5  HCT 40.9  PLT 126*   ------------------------------------------------------------------------------------------------------------------  Chemistries   Recent Labs Lab 09/22/16 2134  09/26/16 0319  NA 137  < > 142  K 3.7  < > 4.0  CL 106  < > 106  CO2 24  < > 28  GLUCOSE 176*  < > 119*  BUN 12  < > 32*  CREATININE 1.22  < > 1.42*  CALCIUM 9.5  < > 9.7  AST 20  --   --   ALT 19  --   --   ALKPHOS 101  --   --   BILITOT 0.5  --   --   < > = values in this interval not displayed. ------------------------------------------------------------------------------------------------------------------  Cardiac Enzymes No results for input(s): TROPONINI in the last 168 hours. ------------------------------------------------------------------------------------------------------------------  RADIOLOGY:  Ct Chest Wo Contrast  Result Date: 09/26/2016 CLINICAL DATA:  Pleural effusion. EXAM: CT CHEST WITHOUT CONTRAST TECHNIQUE: Multidetector CT imaging of the chest was performed following the standard protocol without IV contrast. COMPARISON:  None. FINDINGS: Cardiovascular: Aortic atherosclerosis. Calcifications within the LAD and RCA coronary arteries noted. No pericardial effusion. Mediastinum/Nodes: The trachea appears patent and is midline. Normal appearance the esophagus. Enlarged right paratracheal lymph nodes are identified. Index node measures, image 54 of series  2. Lungs/Pleura: No pleural effusion. Advanced changes of bullous  emphysema identified. Within the limitations of unenhanced technique there is a a suspected right perihilar lung mass measuring approximately 5 x 4 cm, image 58 of series 2. There is postobstructive consolidation in atelectasis involving the right upper lobe. Pneumonitis within the posterior right lower lobe is identified. Upper Abdomen: No acute abnormality. Musculoskeletal: No chest wall mass or suspicious bone lesions identified. IMPRESSION: 1. A right perihilar lung mass is suspected with associated postobstructive atelectasis and consolidation of the right upper lobe. Further investigation with PET-CT, bronchoscopy and tissue sampling suggested 2. Enlarged right paratracheal lymph nodes are worrisome for metastatic adenopathy 3. Bullous emphysema 4. Aortic atherosclerosis and coronary artery calcifications. Electronically Signed   By: Kerby Moors M.D.   On: 09/26/2016 14:40   Dg Chest Port 1 View  Result Date: 09/25/2016 CLINICAL DATA:  Shortness of breath and cough, history hypertension, COPD, diabetes mellitus, CHF, former smoker, coronary artery disease EXAM: PORTABLE CHEST 1 VIEW COMPARISON:  09/22/2016 FINDINGS: Rotated to the RIGHT. Upper normal size of cardiac silhouette. Mediastinal contours normal. Severe bullous emphysematous changes with a large bulla occupying the majority of the upper and mid LEFT hemithorax. New opacity at the RIGHT upper lobe could represent infiltrate or loculated fluid. On the first of 2 views a tiny RIGHT apex pneumothorax is questioned. On the second of two views, pulmonary markings extending peripheral this and this may represent a superimposed artifact or skin fold. Mild RIGHT basilar infiltrate. No acute osseous findings. IMPRESSION: Severe bullous emphysematous changes of the LEFT hemithorax. New atelectasis versus infiltrate at RIGHT base with potentially loculated fluid or additional infiltrate in the RIGHT upper lobe. Electronically Signed   By: Lavonia Dana M.D.    On: 09/25/2016 18:56    EKG:   Orders placed or performed during the hospital encounter of 09/08/16  . ED EKG within 10 minutes  . ED EKG within 10 minutes  . 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  #1 Sepsis on admission- resolving, secondary toPost obstructive pneumonia secondary to right perihilar lung mass -Continue IV Zosyn appreciate ID recommendations -Consult placed to pulmonology Dr. Humphrey Rolls , who might consider doing bronchoscopy after seeing the patient tomorrow a.m. Discontinue aspirin and Lovenox subcutaneous for possible procedure, - blood cultures Negative for 2 days. Discontinued ibuprofen - CXR with pneumonitis and pulm edema -Initial sputum sample was not usable so repeat sputum culture ordered - lasx discontinued as renal function is getting worse - ECHO -Ejection fraction 55-60% -pro calcitonin is trending up 0.13-5.01, consult infectious disease and repeat chest x-ray  #2 Influenza illness-  on tamiflu - continue droplet isolation  #Acute kidney injury holding metformin and provide IV fluids and avoid nephrotoxins repeat a.m. Labs Creatinine 1.28--1.64--1. 7 repeat BMP in a.m. avoid nephrotoxins. Discontinue ibuprofen  #3 CAD- stable, on asa, coreg, statin and losartan  #4 DM- metformin on hold, SSI for now  #5 DVT Prophylaxis- scd  Independent at baseline, feels very weak- physical Therapy consulted.   All the records are reviewed and case discussed with Care Management/Social Workerr. Management plans discussed with the patient, family and they are in agreement.  CODE STATUS: Full code  TOTAL TIME TAKING CARE OF THIS PATIENT: 36 minutes.   POSSIBLE D/C TOMORROW, DEPENDING  ON CLINICAL CONDITION.   [name] M.D on  09/26/2016 at 3:39 PM  Between 7am to 6pm - Pager - [phone]  After 6pm go to [email or web address] - password [email or web address] [facility]  Office  [phone]  CC: Primary care physician; Nino Glow McLean-Scocuzza, MD

## 2016-09-26 NOTE — Evaluation (Signed)
Physical Therapy Evaluation and Discharge Patient Details Name: Chris Wilkinson MRN: [id number] DOB: 31-Mar-1941 Today's Date: 09/26/2016   History of Present Illness  Chris Wilkinson  is a 76 y.o. male who presents with Fever, chills, cough, shortness of breath. He was diagnosed with influenza about a week ago and given Tamiflu. He states that his symptoms grew worse. Here he was found to meet sepsis criteria, and on x-ray imaging has pneumonia.    Clinical Impression  Pt in recliner upon entrance stated he was eager to go home, willing to participate in PT evaluation. Consulted nursing to trial mobility on room air but O2 dropped to 89% at rest so patient was placed on 2 L/min and O2 increased to 96%. Pt is independent in mobility with no serious deficits and able to ambulate w/o the need for an AD. He does display some decreased activity tolerance but is more related to his cardiopulmonary status and medical conditions above; pt able to ambulate around nursing station on 2 L/min, O2 dropped to 86% after ambulation but returned to 93% after sitting for a minute. Nursing notified of O2 levels. Overall patient is able to perform functional tasks but is limited in activity tolerance secondary to cardiopulmonary impairments; He currently does not display any skilled PT needs but may benefit from pulmonary rehab.     Follow Up Recommendations Other (comment) (Pulmonary rehab)    Equipment Recommendations  None recommended by PT    Recommendations for Other Services       Precautions / Restrictions Precautions Precautions: None Restrictions Weight Bearing Restrictions: No      Mobility  Bed Mobility               General bed mobility comments: not formally assessed pt seated in recliner upon entrance  Transfers Overall transfer level: Independent                  Ambulation/Gait Ambulation/Gait assistance: Supervision Ambulation Distance (Feet): 200 Feet Assistive device:  None Gait Pattern/deviations: WFL(Within Functional Limits)   Gait velocity interpretation: at or above normal speed for age/gender General Gait Details: pt ambulated around nursing station w/ good symmetrical gait pattern, became fatigued while ambulating and O2 was 86% on 2 L/min when he returned to his room  Stairs            Wheelchair Mobility    Modified Rankin (Stroke Patients Only)       Balance Overall balance assessment: Independent                                           Pertinent Vitals/Pain Pain Assessment: No/denies pain    Home Living Family/patient expects to be discharged to:: Private residence Living Arrangements: Spouse/significant other Available Help at Discharge: Family;Available PRN/intermittently Type of Home: House Home Access: Stairs to enter Entrance Stairs-Rails: Left Entrance Stairs-Number of Steps: 3 Home Layout: One level Home Equipment: Other (comment) (home O2 uses 2L/min )      Prior Function Level of Independence: Independent         Comments: pt is independent in all ADLs and IADLs, drives and works part time at a [employer]        Extremity/Trunk Assessment   Upper Extremity Assessment Upper Extremity Assessment: Overall WFL for tasks assessed    Lower Extremity Assessment Lower Extremity  Assessment: Overall WFL for tasks assessed       Communication   Communication: No difficulties  Cognition Arousal/Alertness: Awake/alert Behavior During Therapy: WFL for tasks assessed/performed Overall Cognitive Status: Within Functional Limits for tasks assessed                      General Comments      Exercises     Assessment/Plan    PT Assessment Patent does not need any further PT services  PT Problem List         PT Treatment Interventions      PT Goals (Current goals can be found in the Care Plan section)  Acute Rehab PT Goals Patient Stated Goal:  Return home PT Goal Formulation: With patient Time For Goal Achievement: 10/10/16 Potential to Achieve Goals: Good    Frequency     Barriers to discharge        Co-evaluation               End of Session Equipment Utilized During Treatment: Gait belt;Oxygen Activity Tolerance: Patient limited by fatigue Patient left: in chair;with call bell/phone within reach Nurse Communication: Mobility status           Time: 6203-5597 PT Time Calculation (min) (ACUTE ONLY): 18 min   Charges:         PT G Codes:         [employer] Student PT 09/26/2016, 2:29 PM

## 2016-09-26 NOTE — Discharge Summary (Signed)
[facility] NAME: Chris Wilkinson    MR#:  [id number]  DATE OF BIRTH:  1940/10/07  DATE OF ADMISSION:  09/22/2016 ADMITTING PHYSICIAN: Lance Coon, MD  DATE OF DISCHARGE: 09/26/16  PRIMARY CARE PHYSICIAN: Nino Glow McLean-Scocuzza, MD    ADMISSION DIAGNOSIS:  Sepsis, due to unspecified organism ([place]) [A41.9] Fever in other diseases [R50.81] Community acquired pneumonia, unspecified laterality [J18.9]  DISCHARGE DIAGNOSIS:  Sepsis 2/2 post obstructive pna Rt lung perihilar mass  SECONDARY DIAGNOSIS:   Past Medical History:  Diagnosis Date  . CHF (congestive heart failure) ([place])   . COPD (chronic obstructive pulmonary disease) ([place])   . Coronary artery disease   . Diabetes mellitus without complication ([place])   . Fibromyalgia   . GERD (gastroesophageal reflux disease)   . Hypertension     HOSPITAL COURSE:   #1 Sepsis on admission- resolving, secondary toPost obstructive pneumonia secondary to right perihilar lung mass -CT Has Revealed Perihilar Lung Mass with Post Obstructive Pneumonia -Continue IV Zosyn per ID recommendations but pt prefers OP f/u with pulm, d/w dr.saadat khan, op f/u in 3-4 days , will discharge patient with by mouth Augmentin for 14 days as recommended by infectious disease. Patient refused inpatient pulmonology consult - blood cultures Negative for 2 days. Discontinued ibuprofen - CXR with pneumonitis and pulm edema -Initial sputum sample was not usable so repeat sputum culture ordered - lasx discontinued as renal function is getting worse - ECHO -Ejection fraction 55-60% -pro calcitonin is trending up 0.13-5.01, consult infectious disease and repeat chest x-ray  #2 Influenza illness-  on tamiflu - continue droplet isolation  #Acute kidney injury holding metformin and provide IV fluids and avoid nephrotoxins repeat a.m. Labs Creatinine 1.28--1.64--1. 7 repeat BMP in a.m. avoid nephrotoxins.  Discontinue ibuprofen  #3 CAD- stable, on asa, coreg, statin and losartan  #4 DM- metformin on hold, SSI for now  #5 DVT Prophylaxis- scd  Independent at baseline, feels very weak- physical Therapy - no recommendations   DISCHARGE CONDITIONS:   gaurded  CONSULTS OBTAINED:  Treatment Team:  Leonel Ramsay, MD   PROCEDURES none  DRUG ALLERGIES:  No Known Allergies  DISCHARGE MEDICATIONS:   Current Discharge Medication List    START taking these medications   Details  acetaminophen (TYLENOL) 325 MG tablet Take 2 tablets (650 mg total) by mouth every 6 (six) hours as needed for mild pain (or Fever >/= 101).    amoxicillin-clavulanate (AUGMENTIN) 875-125 MG tablet Take 1 tablet by mouth 2 (two) times daily. Qty: 28 tablet, Refills: 0    benzonatate (TESSALON) 200 MG capsule Take 1 capsule (200 mg total) by mouth 3 (three) times daily as needed for cough. Qty: 20 capsule, Refills: 0    oseltamivir (TAMIFLU) 30 MG capsule Take 1 capsule (30 mg total) by mouth 2 (two) times daily. Qty: 3 capsule, Refills: 0      CONTINUE these medications which have NOT CHANGED   Details  albuterol-ipratropium (COMBIVENT) 18-103 MCG/ACT inhaler Inhale 2 puffs into the lungs every 6 (six) hours as needed for wheezing or shortness of breath.    aspirin 325 MG tablet Take 325 mg by mouth daily.    carvedilol (COREG) 25 MG tablet Take 25 mg by mouth 2 (two) times daily with a meal.    Fluticasone Furoate-Vilanterol 100-25 MCG/INH AEPB Inhale 1 puff into the lungs daily.     losartan (COZAAR) 100 MG tablet Take 100 mg by mouth  daily.    metFORMIN (GLUCOPHAGE) 500 MG tablet Take by mouth 2 (two) times daily with a meal.    simvastatin (ZOCOR) 10 MG tablet Take 10 mg by mouth daily.         DISCHARGE INSTRUCTIONS:   Follow-up with primary care physician in 3-4 days Continue oxygen via nasal cannula Outpatient follow-up with the pulmonology Dr. Humphrey Rolls in 3- days  DIET:   Cardiac diet, diabetic  DISCHARGE CONDITION:  Fair  ACTIVITY:  Activity as tolerated  OXYGEN:  Home Oxygen: Yes.     Oxygen Delivery: 2 liters/min via Patient connected to nasal cannula oxygen  DISCHARGE LOCATION:  home   If you experience worsening of your admission symptoms, develop shortness of breath, life threatening emergency, suicidal or homicidal thoughts you must seek medical attention immediately by calling 911 or calling your MD immediately  if symptoms less severe.  You Must read complete instructions/literature along with all the possible adverse reactions/side effects for all the Medicines you take and that have been prescribed to you. Take any new Medicines after you have completely understood and accpet all the possible adverse reactions/side effects.   Please note  You were cared for by a hospitalist during your hospital stay. If you have any questions about your discharge medications or the care you received while you were in the hospital after you are discharged, you can call the unit and asked to speak with the hospitalist on call if the hospitalist that took care of you is not available. Once you are discharged, your primary care physician will handle any further medical issues. Please note that NO REFILLS for any discharge medications will be authorized once you are discharged, as it is imperative that you return to your primary care physician (or establish a relationship with a primary care physician if you do not have one) for your aftercare needs so that they can reassess your need for medications and monitor your lab values.     Today  Chief Complaint  Patient presents with  . Fever   Patient is refusing to stay back until seen and evaluated by pulmonology Dr. Humphrey Rolls tomorrow he is aware that he has lung mass which is concerning for cancer. Patient has promised to follow up with pulmonology in 3-4 days during next week  ROS:  CONSTITUTIONAL: Denies fevers,  chills. Denies any fatigue, weakness.  EYES: Denies blurry vision, double vision, eye pain. EARS, NOSE, THROAT: Denies tinnitus, ear pain, hearing loss. RESPIRATORY: Denies cough, wheeze, shortness of breath.  CARDIOVASCULAR: Denies chest pain, palpitations, edema.  GASTROINTESTINAL: Denies nausea, vomiting, diarrhea, abdominal pain. Denies bright red blood per rectum. GENITOURINARY: Denies dysuria, hematuria. ENDOCRINE: Denies nocturia or thyroid problems. HEMATOLOGIC AND LYMPHATIC: Denies easy bruising or bleeding. SKIN: Denies rash or lesion. MUSCULOSKELETAL: Denies pain in neck, back, shoulder, knees, hips or arthritic symptoms.  NEUROLOGIC: Denies paralysis, paresthesias.  PSYCHIATRIC: Denies anxiety or depressive symptoms.   VITAL SIGNS:  Blood pressure (!) 143/68, pulse 93, temperature 98.8 F (37.1 C), temperature source Oral, resp. rate (!) 26, height '5\' 11"'$  (1.803 m), weight 97.8 kg (215 lb 11.2 oz), SpO2 93 %.  I/O:    Intake/Output Summary (Last 24 hours) at 09/26/16 1757 Last data filed at 09/26/16 0745  Gross per 24 hour  Intake               50 ml  Output              500 ml  Net             -  450 ml    PHYSICAL EXAMINATION:  GENERAL:  76 y.o.-year-old patient lying in the bed with no acute distress.  EYES: Pupils equal, round, reactive to light and accommodation. No scleral icterus. Extraocular muscles intact.  HEENT: Head atraumatic, normocephalic. Oropharynx and nasopharynx clear.  NECK:  Supple, no jugular venous distention. No thyroid enlargement, no tenderness.  LUNGS: mod breath sounds bilaterally, no wheezing, rales,rhonchi or crepitation. No use of accessory muscles of respiration.  CARDIOVASCULAR: S1, S2 normal. No murmurs, rubs, or gallops.  ABDOMEN: Soft, non-tender, non-distended. Bowel sounds present. No organomegaly or mass.  EXTREMITIES: No pedal edema, cyanosis, or clubbing.  NEUROLOGIC: Cranial nerves II through XII are intact. Muscle strength  5/5 in all extremities. Sensation intact. Gait not checked.  PSYCHIATRIC: The patient is alert and oriented x 3.  SKIN: No obvious rash, lesion, or ulcer.   DATA REVIEW:   CBC  Recent Labs Lab 09/26/16 0319  WBC 7.8  HGB 13.5  HCT 40.9  PLT 126*    Chemistries   Recent Labs Lab 09/22/16 2134  09/26/16 0319  NA 137  < > 142  K 3.7  < > 4.0  CL 106  < > 106  CO2 24  < > 28  GLUCOSE 176*  < > 119*  BUN 12  < > 32*  CREATININE 1.22  < > 1.42*  CALCIUM 9.5  < > 9.7  AST 20  --   --   ALT 19  --   --   ALKPHOS 101  --   --   BILITOT 0.5  --   --   < > = values in this interval not displayed.  Cardiac Enzymes No results for input(s): TROPONINI in the last 168 hours.  Microbiology Results  Results for orders placed or performed during the hospital encounter of 09/22/16  Urine culture     Status: None   Collection Time: 09/22/16  9:34 PM  Result Value Ref Range Status   Specimen Description URINE, CLEAN CATCH  Final   Special Requests NONE  Final   Culture   Final    NO GROWTH Performed at [facility], [place] [address] [place], [place] [address]    Report Status 09/24/2016 FINAL  Final  Culture, blood (routine x 2)     Status: None (Preliminary result)   Collection Time: 09/22/16  9:40 PM  Result Value Ref Range Status   Specimen Description BLOOD  LT FA  Final   Special Requests BOTTLES DRAWN AEROBIC AND ANAEROBIC  ADEQUATE   Final   Culture NO GROWTH 4 DAYS  Final   Report Status PENDING  Incomplete  Culture, blood (routine x 2)     Status: None (Preliminary result)   Collection Time: 09/22/16  9:55 PM  Result Value Ref Range Status   Specimen Description BLOOD RIGHT FOREARM  Final   Special Requests BOTTLES DRAWN AEROBIC AND ANAEROBIC  BCAV  Final   Culture NO GROWTH 4 DAYS  Final   Report Status PENDING  Incomplete  MRSA PCR Screening     Status: None   Collection Time: 09/23/16  1:44 AM  Result Value Ref Range Status   MRSA by PCR NEGATIVE  NEGATIVE Final    Comment:        The GeneXpert MRSA Assay (FDA approved for NASAL specimens only), is one component of a comprehensive MRSA colonization surveillance program. It is not intended to diagnose MRSA infection nor to guide or monitor treatment  for MRSA infections.   Culture, sputum-assessment     Status: None (Preliminary result)   Collection Time: 09/23/16  5:45 AM  Result Value Ref Range Status   Specimen Description EXPECTORATED SPUTUM  Final   Special Requests NONE  Final   Sputum evaluation   Final    Sputum specimen not acceptable for testing.  Please recollect.   Notified Brett Canales on 09/23/16 at 0735 for Reorder and Recollect. QSD    Report Status PENDING  Incomplete  Culture, expectorated sputum-assessment     Status: None   Collection Time: 09/25/16  6:10 PM  Result Value Ref Range Status   Specimen Description SPUTUM  Final   Special Requests Normal  Final   Sputum evaluation THIS SPECIMEN IS ACCEPTABLE FOR SPUTUM CULTURE  Final   Report Status 09/25/2016 FINAL  Final    RADIOLOGY:  Dg Chest 2 View  Result Date: 09/22/2016 CLINICAL DATA:  76 y/o  M; shortness of breath. EXAM: CHEST  2 VIEW COMPARISON:  09/08/2013 CT of the chest. FINDINGS: Severe emphysema with bullous changes of the left lung are stable. Increased prominence of interstitial markings best appreciated in the right lung from prior radiographs. No consolidation. No pleural effusion. Stable stable mild cardiomegaly. Moderate degenerative changes of the spine. IMPRESSION: Severe bullous emphysema. Stable mild cardiomegaly. Increase interstitial markings best appreciated in right lung may represent interval development of interstitial edema or atypical pneumonia. Electronically Signed   By: Kristine Garbe M.D.   On: 09/22/2016 22:02   Ct Chest Wo Contrast  Result Date: 09/26/2016 CLINICAL DATA:  Pleural effusion. EXAM: CT CHEST WITHOUT CONTRAST TECHNIQUE: Multidetector CT imaging of  the chest was performed following the standard protocol without IV contrast. COMPARISON:  None. FINDINGS: Cardiovascular: Aortic atherosclerosis. Calcifications within the LAD and RCA coronary arteries noted. No pericardial effusion. Mediastinum/Nodes: The trachea appears patent and is midline. Normal appearance the esophagus. Enlarged right paratracheal lymph nodes are identified. Index node measures, image 54 of series 2. Lungs/Pleura: No pleural effusion. Advanced changes of bullous emphysema identified. Within the limitations of unenhanced technique there is a a suspected right perihilar lung mass measuring approximately 5 x 4 cm, image 58 of series 2. There is postobstructive consolidation in atelectasis involving the right upper lobe. Pneumonitis within the posterior right lower lobe is identified. Upper Abdomen: No acute abnormality. Musculoskeletal: No chest wall mass or suspicious bone lesions identified. IMPRESSION: 1. A right perihilar lung mass is suspected with associated postobstructive atelectasis and consolidation of the right upper lobe. Further investigation with PET-CT, bronchoscopy and tissue sampling suggested 2. Enlarged right paratracheal lymph nodes are worrisome for metastatic adenopathy 3. Bullous emphysema 4. Aortic atherosclerosis and coronary artery calcifications. Electronically Signed   By: Kerby Moors M.D.   On: 09/26/2016 14:40   Dg Chest Port 1 View  Result Date: 09/25/2016 CLINICAL DATA:  Shortness of breath and cough, history hypertension, COPD, diabetes mellitus, CHF, former smoker, coronary artery disease EXAM: PORTABLE CHEST 1 VIEW COMPARISON:  09/22/2016 FINDINGS: Rotated to the RIGHT. Upper normal size of cardiac silhouette. Mediastinal contours normal. Severe bullous emphysematous changes with a large bulla occupying the majority of the upper and mid LEFT hemithorax. New opacity at the RIGHT upper lobe could represent infiltrate or loculated fluid. On the first of 2  views a tiny RIGHT apex pneumothorax is questioned. On the second of two views, pulmonary markings extending peripheral this and this may represent a superimposed artifact or skin fold. Mild RIGHT basilar infiltrate. No  acute osseous findings. IMPRESSION: Severe bullous emphysematous changes of the LEFT hemithorax. New atelectasis versus infiltrate at RIGHT base with potentially loculated fluid or additional infiltrate in the RIGHT upper lobe. Electronically Signed   By: Lavonia Dana M.D.   On: 09/25/2016 18:56    EKG:   Orders placed or performed during the hospital encounter of 09/08/16  . ED EKG within 10 minutes  . ED EKG within 10 minutes  . EKG      Management plans discussed with the patient, family and they are in agreement.  CODE STATUS:     Code Status Orders        Start     Ordered   09/23/16 0049  Full code  Continuous     09/23/16 0048    Code Status History    Date Active Date Inactive Code Status Order ID Comments User Context   08/04/2015 10:38 PM 08/05/2015  3:28 PM Full Code [id number]  Lance Coon, MD ED      TOTAL TIME TAKING CARE OF THIS PATIENT: 45 minutes.   Note: This dictation was prepared with Dragon dictation along with smaller phrase technology. Any transcriptional errors that result from this process are unintentional.   '@M[facility]'$ @  on 09/26/2016 at 5:57 PM  Between 7am to 6pm - Pager - [phone]  After 6pm go to [email or web address] - password [email or web address] [facility]  Office  [phone]  CC: Primary care physician; Nino Glow McLean-Scocuzza, MD

## 2016-09-27 LAB — CULTURE, BLOOD (ROUTINE X 2)
Culture: NO GROWTH
Culture: NO GROWTH
SPECIAL REQUESTS: ADEQUATE

## 2016-09-28 LAB — CULTURE, RESPIRATORY

## 2016-09-28 LAB — CULTURE, RESPIRATORY W GRAM STAIN
Culture: NORMAL
Special Requests: NORMAL

## 2016-09-29 LAB — EXPECTORATED SPUTUM ASSESSMENT W GRAM STAIN, RFLX TO RESP C

## 2016-09-30 LAB — GLUCOSE, CAPILLARY: Glucose-Capillary: 133 mg/dL — ABNORMAL HIGH (ref 65–99)

## 2016-10-06 ENCOUNTER — Other Ambulatory Visit: Payer: Self-pay | Admitting: Internal Medicine

## 2016-10-06 DIAGNOSIS — R918 Other nonspecific abnormal finding of lung field: Secondary | ICD-10-CM

## 2016-10-10 ENCOUNTER — Ambulatory Visit
Admission: RE | Admit: 2016-10-10 | Discharge: 2016-10-10 | Disposition: A | Discharge status: Home / Self Care | Payer: Medicare Other | Source: Ambulatory Visit | Attending: Internal Medicine | Admitting: Internal Medicine

## 2016-10-10 DIAGNOSIS — J9819 Other pulmonary collapse: Secondary | ICD-10-CM | POA: Diagnosis not present

## 2016-10-10 DIAGNOSIS — R918 Other nonspecific abnormal finding of lung field: Secondary | ICD-10-CM | POA: Diagnosis present

## 2016-10-10 DIAGNOSIS — C801 Malignant (primary) neoplasm, unspecified: Secondary | ICD-10-CM | POA: Diagnosis not present

## 2016-10-10 DIAGNOSIS — C771 Secondary and unspecified malignant neoplasm of intrathoracic lymph nodes: Secondary | ICD-10-CM | POA: Insufficient documentation

## 2016-10-10 LAB — GLUCOSE, CAPILLARY: GLUCOSE-CAPILLARY: 157 mg/dL — AB (ref 65–99)

## 2016-10-10 MED ORDER — FLUDEOXYGLUCOSE F - 18 (FDG) INJECTION
12.6300 | Freq: Once | INTRAVENOUS | Status: AC | PRN
  Administered 2016-10-10: 12.63 via INTRAVENOUS

## 2016-10-10 MED ORDER — IOPAMIDOL (ISOVUE-300) INJECTION 61%
100.0000 mL | Freq: Once | INTRAVENOUS | Status: AC | PRN
  Administered 2016-10-10: 100 mL via INTRAVENOUS

## 2016-10-14 ENCOUNTER — Encounter: Payer: Self-pay | Admitting: *Deleted

## 2016-10-14 ENCOUNTER — Other Ambulatory Visit: Payer: Self-pay | Admitting: *Deleted

## 2016-10-14 DIAGNOSIS — R918 Other nonspecific abnormal finding of lung field: Secondary | ICD-10-CM

## 2016-10-14 NOTE — Progress Notes (Signed)
  Oncology Nurse Navigator Documentation  Navigator Location: [facility] (10/14/16 1500)   )Navigator Encounter Type: Telephone (10/14/16 1500)                             Interventions: Coordination of Care (10/14/16 1500)   Coordination of Care: Appts (10/14/16 1500)         referral received from Dr. Olivia Mackie Mclean-Scocuzza for lung mass to see Dr. Genevive Bi. Per conversation with Dr. Genevive Bi, pt has non resectable disease and needs to see medical oncology initially. appt scheduled for pt to be seen in [facility] [place] with Dr. Janese Banks on 4/6 at 10am. attempted to contact patient to give appointment information. Pt did not answer. Message left for patient to call me back.          Time Spent with Patient: 15 (10/14/16 1500)

## 2016-10-14 NOTE — Progress Notes (Signed)
  Oncology Nurse Navigator Documentation  Navigator Location: [facility] (10/14/16 1600)   )Navigator Encounter Type: Introductory phone call (10/14/16 1600)                             Interventions: Coordination of Care (10/14/16 1600)   Coordination of Care: Appts (10/14/16 1600)       pt is aware of appt scheduled on 4/6 at 10am. Discussed with patient what to expect at appt. PET scan results reviewed. Pt stated is tentatively scheduled for bronchoscopy on 4/10.            Time Spent with Patient: 15 (10/14/16 1600)

## 2016-10-16 ENCOUNTER — Inpatient Hospital Stay: Payer: Medicare Other | Attending: Oncology

## 2016-10-16 DIAGNOSIS — Z7984 Long term (current) use of oral hypoglycemic drugs: Secondary | ICD-10-CM | POA: Insufficient documentation

## 2016-10-16 DIAGNOSIS — R590 Localized enlarged lymph nodes: Secondary | ICD-10-CM | POA: Diagnosis not present

## 2016-10-16 DIAGNOSIS — K219 Gastro-esophageal reflux disease without esophagitis: Secondary | ICD-10-CM | POA: Insufficient documentation

## 2016-10-16 DIAGNOSIS — Z7982 Long term (current) use of aspirin: Secondary | ICD-10-CM | POA: Diagnosis not present

## 2016-10-16 DIAGNOSIS — Z79899 Other long term (current) drug therapy: Secondary | ICD-10-CM | POA: Diagnosis not present

## 2016-10-16 DIAGNOSIS — N281 Cyst of kidney, acquired: Secondary | ICD-10-CM | POA: Diagnosis not present

## 2016-10-16 DIAGNOSIS — M797 Fibromyalgia: Secondary | ICD-10-CM | POA: Diagnosis not present

## 2016-10-16 DIAGNOSIS — I2510 Atherosclerotic heart disease of native coronary artery without angina pectoris: Secondary | ICD-10-CM | POA: Diagnosis not present

## 2016-10-16 DIAGNOSIS — C3411 Malignant neoplasm of upper lobe, right bronchus or lung: Secondary | ICD-10-CM | POA: Insufficient documentation

## 2016-10-16 DIAGNOSIS — I110 Hypertensive heart disease with heart failure: Secondary | ICD-10-CM | POA: Insufficient documentation

## 2016-10-16 DIAGNOSIS — J440 Chronic obstructive pulmonary disease with acute lower respiratory infection: Secondary | ICD-10-CM | POA: Insufficient documentation

## 2016-10-16 DIAGNOSIS — I509 Heart failure, unspecified: Secondary | ICD-10-CM | POA: Diagnosis not present

## 2016-10-16 DIAGNOSIS — Z87891 Personal history of nicotine dependence: Secondary | ICD-10-CM | POA: Diagnosis not present

## 2016-10-16 DIAGNOSIS — E119 Type 2 diabetes mellitus without complications: Secondary | ICD-10-CM | POA: Insufficient documentation

## 2016-10-17 ENCOUNTER — Inpatient Hospital Stay: Payer: Medicare Other | Admitting: Oncology

## 2016-10-21 ENCOUNTER — Inpatient Hospital Stay: Payer: Medicare Other | Admitting: Oncology

## 2016-10-21 MED ORDER — BUTAMBEN-TETRACAINE-BENZOCAINE 2-2-14 % EX AERO
1.0000 | INHALATION_SPRAY | Freq: Once | CUTANEOUS | Status: DC
  Filled 2016-10-21: qty 20

## 2016-10-21 MED ORDER — LIDOCAINE HCL 2 % EX GEL: 1.0000 "application " | Freq: Once | CUTANEOUS | Status: DC

## 2016-10-21 MED ORDER — PHENYLEPHRINE HCL 0.25 % NA SOLN
1.0000 | Freq: Four times a day (QID) | NASAL | Status: DC | PRN
Start: 2016-10-21 — End: 2016-10-22
  Filled 2016-10-21 (×2): qty 15

## 2016-10-22 ENCOUNTER — Ambulatory Visit
Admission: RE | Admit: 2016-10-22 | Discharge: 2016-10-22 | Disposition: A | Discharge status: Home / Self Care | Payer: Medicare Other | Source: Ambulatory Visit | Attending: Internal Medicine | Admitting: Internal Medicine

## 2016-10-22 ENCOUNTER — Encounter: Payer: Self-pay | Admitting: *Deleted

## 2016-10-22 ENCOUNTER — Encounter
Admission: RE | Disposition: A | Discharge status: Home / Self Care | Payer: Self-pay | Source: Ambulatory Visit | Attending: Internal Medicine

## 2016-10-22 DIAGNOSIS — Z79899 Other long term (current) drug therapy: Secondary | ICD-10-CM | POA: Insufficient documentation

## 2016-10-22 DIAGNOSIS — Z833 Family history of diabetes mellitus: Secondary | ICD-10-CM | POA: Diagnosis not present

## 2016-10-22 DIAGNOSIS — I10 Essential (primary) hypertension: Secondary | ICD-10-CM | POA: Insufficient documentation

## 2016-10-22 DIAGNOSIS — E119 Type 2 diabetes mellitus without complications: Secondary | ICD-10-CM | POA: Insufficient documentation

## 2016-10-22 DIAGNOSIS — E785 Hyperlipidemia, unspecified: Secondary | ICD-10-CM | POA: Insufficient documentation

## 2016-10-22 DIAGNOSIS — Z8042 Family history of malignant neoplasm of prostate: Secondary | ICD-10-CM | POA: Insufficient documentation

## 2016-10-22 DIAGNOSIS — Z8249 Family history of ischemic heart disease and other diseases of the circulatory system: Secondary | ICD-10-CM | POA: Insufficient documentation

## 2016-10-22 DIAGNOSIS — C3411 Malignant neoplasm of upper lobe, right bronchus or lung: Secondary | ICD-10-CM | POA: Insufficient documentation

## 2016-10-22 DIAGNOSIS — I2510 Atherosclerotic heart disease of native coronary artery without angina pectoris: Secondary | ICD-10-CM | POA: Diagnosis not present

## 2016-10-22 DIAGNOSIS — J449 Chronic obstructive pulmonary disease, unspecified: Secondary | ICD-10-CM | POA: Diagnosis not present

## 2016-10-22 DIAGNOSIS — R918 Other nonspecific abnormal finding of lung field: Secondary | ICD-10-CM | POA: Diagnosis present

## 2016-10-22 DIAGNOSIS — Z7982 Long term (current) use of aspirin: Secondary | ICD-10-CM | POA: Insufficient documentation

## 2016-10-22 DIAGNOSIS — Z9889 Other specified postprocedural states: Secondary | ICD-10-CM

## 2016-10-22 HISTORY — PX: FLEXIBLE BRONCHOSCOPY: SHX5094

## 2016-10-22 SURGERY — BRONCHOSCOPY, FLEXIBLE
Anesthesia: Moderate Sedation

## 2016-10-22 MED ORDER — FENTANYL CITRATE (PF) 100 MCG/2ML IJ SOLN
INTRAMUSCULAR | Status: AC
  Filled 2016-10-22: qty 2

## 2016-10-22 MED ORDER — LORAZEPAM 2 MG/ML IJ SOLN
INTRAMUSCULAR | Status: AC | PRN
  Administered 2016-10-22: 2 mg via INTRAVENOUS

## 2016-10-22 MED ORDER — MIDAZOLAM HCL 5 MG/5ML IJ SOLN
INTRAMUSCULAR | Status: AC
  Filled 2016-10-22: qty 10

## 2016-10-22 MED ORDER — FENTANYL CITRATE (PF) 100 MCG/2ML IJ SOLN
INTRAMUSCULAR | Status: AC | PRN
  Administered 2016-10-22: 50 ug via INTRAVENOUS

## 2016-10-22 MED ORDER — MIDAZOLAM HCL 2 MG/2ML IJ SOLN
INTRAMUSCULAR | Status: AC | PRN
  Administered 2016-10-22: 1 mg via INTRAVENOUS

## 2016-10-22 MED ORDER — SODIUM CHLORIDE 0.9 % IV SOLN: Freq: Once | INTRAVENOUS | Status: DC

## 2016-10-22 NOTE — Op Note (Signed)
Date: 10/22/2016,  MRN# [id number] FAHEEM ZIEMANN August 28, 1940  Procedure Note: Fiberoptic Bronchoscopy   PROCEDURE DATE: 10/22/2016  NAME:  ROLLIE HYNEK  DOB:1940-12-11  MRN: [id number] LOC:  [facility]/None    CODE STATUS:  Full code Code Status History    Date Active Date Inactive Code Status Order ID Comments User Context   09/23/2016 12:48 AM 09/26/2016 10:04 PM Full Code [id number]  Lance Coon, MD Inpatient   08/04/2015 10:38 PM 08/05/2015  3:28 PM Full Code [id number]  Lance Coon, MD ED        ATTENDING:    Indications/Preliminary Diagnosis:   Consent: (Place X beside choice/s below)  The benefits, risks and possible complications of the procedure were        explained to:  _x__ patient  __x_ patient's family  ___ other:___________  who verbalized understanding and gave:  ___ verbal  _x__ written  ___ verbal and written  ___ telephone  ___ other:________ consent.      Unable to obtain consent; procedure performed on emergent basis.     Other:    Bronchoscope Serial #:     PRESEDATION ASSESSMENT: History and Physical has been performed. Patient meds and allergies have been reviewed. Presedation airway examination has been performed and documented. Baseline vital signs, sedation score, oxygenation status, and cardiac rhythm were reviewed. Patient was deemed to be in satisfactory condition to undergo the procedure.  PREMEDICATIONS:   Sedative/Narcotic Amt Dose   Versed 2 mg   Fentanyl 50 mcg  Diprivan  mg        Airway Prep (Place X beside choice below)  x 1% Transtracheal Lidocaine Anesthetization 7 cc  x Patient prepped per Bronchoscopy Lab Policy       Insertion Route (Place X beside choice below)  x Nasal   Oral   Endotracheal Tube   Tracheostomy   INTRAPROCEDURE MEDICATIONS:  Sedative/Narcotic Amt Dose   Versed 1 mg   Fentanyl  mcg  Diprivan  mg       Medication Amt Dose  Medication Amt Dose  Xylocaine 2%  cc  Epinephrine 1:10,000 sol  cc  Xylocaine 4%  cc   Cocaine  cc   TECHNICAL PROCEDURES: (Place X beside choice below)   Procedures  Description  x  None     Electrocautery     Cryotherapy     Balloon Dilatation     Bronchography     Stent Placement     Therapeutic Aspiration     Laser/Argon Plasma    Brachytherapy Catheter Placement    Foreign Body Removal     SPECIMENS (Sites): (Place X beside choice below)  Specimens Description   No Specimens Obtained    x Washings   x Lavage   x Biopsies    Fine Needle Aspirates   x Brushings    Sputum    FINDINGS: Mass noted in the RUL. Area was brushed and washed. Patient had some bleeding noted which stopped spontaneously. Patient was given 2cc epi in the area prior to brushings and biopsy  ESTIMATED BLOOD LOSS: insignificant  COMPLICATIONS/RESOLUTION: none  PROCEDURE DETAILS: Timeout performed and correct patient, name, & ID confirmed. Following prep per Pulmonary policy, appropriate sedation was administered.  Airway exam proceeded with findings, technical procedures, and specimen collection as noted below. At the end of exam the scope was withdrawn without incident. Impression and Plan as noted below.   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   ADDITIONAL COMMENTS: none    Allyne Gee, MD [facility] Pulmonary Critical Care Medicine

## 2016-10-23 ENCOUNTER — Encounter: Payer: Self-pay | Admitting: Internal Medicine

## 2016-10-24 ENCOUNTER — Telehealth: Payer: Self-pay | Admitting: *Deleted

## 2016-10-24 LAB — CYTOLOGY - NON PAP

## 2016-10-24 LAB — SURGICAL PATHOLOGY

## 2016-10-24 NOTE — Telephone Encounter (Signed)
Endobronchial Biopsy shows NSCC, favor Squamous Cell Carcinoma   Has appt to see Dr Janese Banks 10/28/16, Dr Janese Banks informed of results verbally by myself

## 2016-10-28 ENCOUNTER — Telehealth: Payer: Self-pay | Admitting: *Deleted

## 2016-10-28 ENCOUNTER — Encounter: Payer: Self-pay | Admitting: Oncology

## 2016-10-28 ENCOUNTER — Inpatient Hospital Stay (HOSPITAL_BASED_OUTPATIENT_CLINIC_OR_DEPARTMENT_OTHER): Payer: Medicare Other | Admitting: Oncology

## 2016-10-28 VITALS — BP 112/69 | HR 90 | Temp 97.0°F | Resp 18 | Ht 71.0 in | Wt 210.4 lb

## 2016-10-28 DIAGNOSIS — R590 Localized enlarged lymph nodes: Secondary | ICD-10-CM | POA: Diagnosis not present

## 2016-10-28 DIAGNOSIS — Z7189 Other specified counseling: Secondary | ICD-10-CM

## 2016-10-28 DIAGNOSIS — C3411 Malignant neoplasm of upper lobe, right bronchus or lung: Secondary | ICD-10-CM | POA: Diagnosis not present

## 2016-10-28 DIAGNOSIS — C3491 Malignant neoplasm of unspecified part of right bronchus or lung: Secondary | ICD-10-CM

## 2016-10-28 NOTE — Telephone Encounter (Signed)
Notified pt of appt for brain MRI on 4/18 at 12pm. Pt instructed [place] [facility]. Pt verbalized understanding.

## 2016-10-28 NOTE — Progress Notes (Signed)
Hematology/Oncology Consult note [facility] [phone] [phone]  Patient Care Team: Nino Glow McLean-Scocuzza, MD as PCP - General (Internal Medicine)   Name of the patient: Chris Wilkinson  [id number]  June 18, 1941    Reason for referral- lung cancer   Referring physician- Dr. Aundra Dubin  Date of visit: 10/28/16   History of presenting illness- 1. Patient is a 76 year old African-American male with a past medical history significant for COPD for which she sees Dr. Humphrey Rolls. He was recently admitted to the hospital in March 2018 with symptoms of pneumonia. CT thorax at that time showed: Perihilar lung mass with associated postobstructive atelectasis and consolidation of the right upper lobe  2. This was followed by a PET CT scan on 10/10/2016 which showed:1. Hypermetabolic RIGHT hilar mass with postobstructive collapse of the RIGHT upper lobe consistent with primary or metastatic bronchogenic carcinoma. 2. Hypermetabolic RIGHT paratracheal, prevascular and RIGHT supraclavicular nodal metastasis. 3. RIGHT supraclavicular node is intensely metabolic but likely too small for biopsy. 4. RIGHT lower lobe peripheral nodular thickening with associated metabolic activity is likely inflammatory. 5. Severe bullous change in the LEFT hemithorax  3.   CT abdomen from 10/10/2016 showed no evidence of metastatic disease.  4. 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 Patient has problems with bilateral lower extremity swelling as well as shortness of breath on minimal exertion. He uses nighttime oxygen. He follows up with Dr. Humphrey Rolls from pulmonary as well as cardiology  ECOG PS- 2  Pain scale- 0   Review of systems- Review of Systems  Constitutional: Negative for chills, fever, malaise/fatigue and weight loss.  HENT: Negative for congestion,  ear discharge and nosebleeds.   Eyes: Negative for blurred vision.  Respiratory: Negative for cough, hemoptysis, sputum production, shortness of breath and wheezing.   Cardiovascular: Negative for chest pain, palpitations, orthopnea and claudication.  Gastrointestinal: Negative for abdominal pain, blood in stool, constipation, diarrhea, heartburn, melena, nausea and vomiting.  Genitourinary: Negative for dysuria, flank pain, frequency, hematuria and urgency.  Musculoskeletal: Negative for back pain, joint pain and myalgias.  Skin: Negative for rash.  Neurological: Negative for dizziness, tingling, focal weakness, seizures, weakness and headaches.  Endo/Heme/Allergies: Does not bruise/bleed easily.  Psychiatric/Behavioral: Negative for depression and suicidal ideas. The patient does not have insomnia.     No Known Allergies  Patient Active Problem List   Diagnosis Date Noted  . HCAP (healthcare-associated pneumonia) 09/22/2016  . Sepsis ([place]) 08/04/2015  . CAP (community acquired pneumonia) 08/04/2015  . COPD (chronic obstructive pulmonary disease) ([place]) 08/04/2015  . Type 2 diabetes mellitus ([place]) 08/04/2015  . CAD (coronary artery disease) 08/04/2015  . HTN (hypertension) 08/04/2015  . GERD (gastroesophageal reflux disease) 08/04/2015     Past Medical History:  Diagnosis Date  . CHF (congestive heart failure) ([place])   . COPD (chronic obstructive pulmonary disease) ([place])   . Coronary artery disease   . Diabetes mellitus without complication ([place])   . Fibromyalgia   . GERD (gastroesophageal reflux disease)   . Hypertension      Past Surgical History:  Procedure Laterality Date  . COLONOSCOPY WITH PROPOFOL N/A 01/18/2015   Procedure: COLONOSCOPY WITH PROPOFOL;  Surgeon: Manya Silvas, MD;  Location: [facility] ENDOSCOPY;  Service: Endoscopy;  Laterality: N/A;  . ESOPHAGOGASTRODUODENOSCOPY N/A 01/18/2015   Procedure: ESOPHAGOGASTRODUODENOSCOPY (EGD);  Surgeon: Manya Silvas, MD;   Location: [facility];  Service: Endoscopy;  Laterality: N/A;  . EYE SURGERY    . FLEXIBLE BRONCHOSCOPY N/A 10/22/2016   Procedure: FLEXIBLE BRONCHOSCOPY;  Surgeon: Allyne Gee, MD;  Location: [facility] ORS;  Service: Pulmonary;  Laterality: N/A;  . HERNIA REPAIR    . REPAIR KNEE LIGAMENT    . TONSILLECTOMY      Social History   Social History  . Marital status: Married    Spouse name: N/A  . Number of children: N/A  . Years of education: N/A   Occupational History  . Not on file.   Social History Main Topics  . Smoking status: Former Smoker    Quit date: 12/01/2003  . Smokeless tobacco: Never Used  . Alcohol use 1.2 oz/week    2 Shots of liquor per week  . Drug use: No  . Sexual activity: Not on file   Other Topics Concern  . Not on file   Social History Narrative  . No narrative on file     Family History  Problem Relation Age of Onset  . Prostate cancer Father   . Prostate cancer Brother   . Diabetes      all 13 siblings  . Hypertension      all 13 siblings     Current Outpatient Prescriptions:  .  acetaminophen (TYLENOL) 325 MG tablet, Take 2 tablets (650 mg total) by mouth every 6 (six) hours as needed for mild pain (or Fever >/= 101)., Disp: , Rfl:  .  albuterol-ipratropium (COMBIVENT) 18-103 MCG/ACT inhaler, Inhale 2 puffs into the lungs every 6 (six) hours as needed for wheezing or shortness of breath., Disp: , Rfl:  .  aspirin 325 MG tablet, Take 325 mg by mouth daily., Disp: , Rfl:  .  carvedilol (COREG) 25 MG tablet, Take 25 mg by mouth 2 (two) times daily with a meal., Disp: , Rfl:  .  Fluticasone Furoate-Vilanterol 100-25 MCG/INH AEPB, Inhale 1 puff into the lungs daily. , Disp: , Rfl:  .  losartan (COZAAR) 100 MG tablet, Take 100 mg by mouth daily., Disp: , Rfl:  .  metFORMIN (GLUCOPHAGE) 500 MG tablet, Take 500 mg by mouth 2 (two) times daily. , Disp: , Rfl:  .  simvastatin (ZOCOR) 10 MG tablet, Take 10 mg by mouth every evening. , Disp: , Rfl:  .   sitaGLIPtin (JANUVIA) 50 MG tablet, Take 50 mg by mouth daily., Disp: , Rfl:  .  benzonatate (TESSALON) 200 MG capsule, Take 1 capsule (200 mg total) by mouth 3 (three) times daily as needed for cough. (Patient not taking: Reported on 10/16/2016), Disp: 20 capsule, Rfl: 0 .  omeprazole (PRILOSEC) 40 MG capsule, Take 40 mg by mouth daily., Disp: , Rfl:    Physical exam:  Vitals:   10/28/16 1149  BP: 112/69  Pulse: 90  Resp: 18  Temp: 97 F (36.1 C)  TempSrc: Oral  Weight: 210 lb 6.4 oz (95.4 kg)  Height: _0  (1.803 m)   Physical Exam  Constitutional: He is oriented to person, place, and time and well-developed, well-nourished, and in no distress.  HENT:  Head: Normocephalic and atraumatic.  Eyes: EOM are normal. Pupils are equal, round, and reactive to light.  Neck: Normal range of motion.  Cardiovascular: Normal rate, regular rhythm and normal heart sounds.   Pulmonary/Chest: Effort normal.  Breath sounds decreased b/l  Abdominal: Soft. Bowel sounds are normal.  Musculoskeletal: He exhibits edema.  Neurological: He is alert and oriented to person, place, and  time.  Skin: Skin is warm and dry.       CMP Latest Ref Rng & Units 09/26/2016  Glucose 65 - 99 mg/dL 119(H)  BUN 6 - 20 mg/dL 32(H)  Creatinine 0.61 - 1.24 mg/dL 1.42(H)  Sodium 135 - 145 mmol/L 142  Potassium 3.5 - 5.1 mmol/L 4.0  Chloride 101 - 111 mmol/L 106  CO2 22 - 32 mmol/L 28  Calcium 8.9 - 10.3 mg/dL 9.7  Total Protein 6.5 - 8.1 g/dL -  Total Bilirubin 0.3 - 1.2 mg/dL -  Alkaline Phos 38 - 126 U/L -  AST 15 - 41 U/L -  ALT 17 - 63 U/L -   CBC Latest Ref Rng & Units 09/26/2016  WBC 3.8 - 10.6 K/uL 7.8  Hemoglobin 13.0 - 18.0 g/dL 13.5  Hematocrit 40.0 - 52.0 % 40.9  Platelets 150 - 440 K/uL 126(L)    No images are attached to the encounter.  Ct Abdomen Pelvis W Contrast  Result Date: 10/10/2016 CLINICAL DATA:  Pt stated was in hospital couple weeks ago for flu and pneumonia.Pt stated he changed  dr and they sent him here for follow up exam due to going to hospital. Pt order sheet states nonspecific abnormal finding of lung field, which is a right hilar mass and upper lobe consolidation/ collapse on the current PET-CT. EXAM: CT ABDOMEN AND PELVIS WITH CONTRAST TECHNIQUE: Multidetector CT imaging of the abdomen and pelvis was performed using the standard protocol following bolus administration of intravenous contrast. CONTRAST:  133m ISOVUE-300 IOPAMIDOL (ISOVUE-300) INJECTION 61% COMPARISON:  PET CT, 10/10/2016 at 11:40 a.m. FINDINGS: Lower chest: Areas of peripheral scarring, with emphysematous change most evident at the base of the left upper lobe lingula, stable from the earlier study. Hepatobiliary: No focal liver abnormality is seen. No gallstones, gallbladder wall thickening, or biliary dilatation. Pancreas: Unremarkable. No pancreatic ductal dilatation or surrounding inflammatory changes. Spleen: Normal in size without focal abnormality. Adrenals/Urinary Tract: No adrenal masses. 7 cm upper pole and 5.8 cm lower pole left renal cyst. 17 mm low-attenuation right midpole mass also most likely a cyst. No other renal masses, no stones and no hydronephrosis. Normal ureters. Unremarkable bladder. Stomach/Bowel: Stomach is within normal limits. Appendix appears normal. No evidence of bowel wall thickening, distention, or inflammatory changes. Vascular/Lymphatic: Atherosclerotic plaque is noted along the abdominal aorta and its branch vessels. No aneurysm. No adenopathy. Reproductive: Prostate is mildly enlarged. Other: No abdominal wall hernia or abnormality. No abdominopelvic ascites. Musculoskeletal: No fracture or acute finding. No osteoblastic or osteolytic lesions. Degenerative changes of the visualized spine and hips. IMPRESSION: 1. No acute findings within the abdomen or pelvis. 2. No evidence of metastatic disease within the abdomen or pelvis. Electronically Signed   By: DLajean ManesM.D.   On:  10/10/2016 14:23   Nm Pet Image Initial (pi) Skull Base To Thigh  Result Date: 10/10/2016 CLINICAL DATA:  Initial treatment strategy for lung mass. RIGHT lung mass. EXAM: NUCLEAR MEDICINE PET SKULL BASE TO THIGH TECHNIQUE: 12.3 mCi F-18 FDG was injected intravenously. Full-ring PET imaging was performed from the skull base to thigh after the radiotracer. CT data was obtained and used for attenuation correction and anatomic localization. FASTING BLOOD GLUCOSE:  Value: 157 mg/dl COMPARISON:  CT 09/26/2016 FINDINGS: NECK Hypermetabolic RIGHT supraclavicular lymph node is small measuring 7 mm but with high metabolic activity for size (SUV max equals 6.0). CHEST Hypermetabolic mass within the RIGHT upper lobe centrally in suprahilar location. Mass is difficult define on  the noncontrast CT but measures approximately 3 cm. There is postobstructive collapse of the RIGHT upper lobe distal to this obstructing mass. The mass is hypermetabolic with SUV max equal 11.8. Intensely hypermetabolic enlarged RIGHT lower paratracheal lymph node with SUV max equal 11.0. Hypermetabolic right paratracheal node and prevascular lymph nodes which are small but intensely metabolic (SUV max equal 5.0). Peripheral nodular thickening in the RIGHT lower lobe at 2 locations with mild metabolic activity. (Image 109 series 3 with SUV max equal 3.7). Severe bullous change in the LEFT lung. ABDOMEN/PELVIS No abnormal hypermetabolic activity within the liver, pancreas, adrenal glands, or spleen. No hypermetabolic lymph nodes in the abdomen or pelvis. SKELETON No focal hypermetabolic activity to suggest skeletal metastasis. IMPRESSION: 1. Hypermetabolic RIGHT hilar mass with postobstructive collapse of the RIGHT upper lobe consistent with primary or metastatic bronchogenic carcinoma. 2. Hypermetabolic RIGHT paratracheal, prevascular and RIGHT supraclavicular nodal metastasis. 3. RIGHT supraclavicular node is intensely metabolic but likely too small  for biopsy. 4. RIGHT lower lobe peripheral nodular thickening with associated metabolic activity is likely inflammatory. 5. Severe bullous change in the LEFT hemithorax Electronically Signed   By: Suzy Bouchard M.D.   On: 10/10/2016 13:08    Assessment and plan- Patient is a 76 y.o. male with newly diagnosed SCC of RUL Stage IIIB T1N3M0  I discussed the results of the CT scans and PET CT as well as the pathology results with the patient in detail. Given that patient has about a 3 cm right upper lobe lung mass along with supraclavicular adenopathy in addition to mediastinal adenopathy he has stage III disease. My recommendation would be to treat him with concurrent chemoradiation with weekly carbotaxol followed by maintenance immunotherapy for stable disease. Radiation is typically given for 6-7 weeks daily from Monday to Friday. Patient has apprehensions about traveling back and forth for his radiation and is wondering if he can get a shorter course of radiation or omit radiation altogether. I told him that he would be referred to radiation oncology and can decide if he can do definitive radiation for 6 weeks.   If he decides not to do radiation for that long, I could give him 4 cycles of chemotherapy with carbo AUC 5 and taxol at 175 mg/meter square Q 3 weeks X 4 cycles followed by short course RT to his chest although that would not be desirable. Patient wants some time to think about all this. I will get MRI brain with and without contrast in the meanwhile to complete his staging work up and refer him to rad onc. We will make a follow up phone call in a couple of days and if he decides to pursue chemotherapy with or without RT, we will plan for port placement and chemo class. I will tentatively see him in 10 days time. Chemo/RT will be given with a curative intent  I discussed risks and benefits of chemotherapy including all but not limited to nausea, vomiting, fatigue, risk of low blood counts and  infections as well as peripheral neuropathy and infusion reactions with Taxol. Patient would like to think about all these options and get back to   Patient has exertional shortness of breath which is likely secondary to his underlying COPD and pulmonary hypertension and underlying malignancy. He will be seeing Dr. Humphrey Rolls later this week to adjust his medications   Total face to face encounter time for this patient visit was 30 min. >50% of the time was  spent in counseling and coordination  of care.    Thank you for this kind referral and the opportunity to participate in the care of this patient  Visit Diagnosis 1. Malignant neoplasm of right lung, unspecified part of lung ([place])     Dr. Randa Evens, MD, MPH [facility] Pager- [phone] 10/28/2016

## 2016-10-29 ENCOUNTER — Ambulatory Visit
Admission: RE | Admit: 2016-10-29 | Discharge: 2016-10-29 | Disposition: A | Discharge status: Home / Self Care | Payer: Medicare Other | Source: Ambulatory Visit | Attending: Oncology | Admitting: Oncology

## 2016-10-29 ENCOUNTER — Encounter: Payer: Self-pay | Admitting: *Deleted

## 2016-10-29 DIAGNOSIS — C3491 Malignant neoplasm of unspecified part of right bronchus or lung: Secondary | ICD-10-CM

## 2016-10-29 MED ORDER — GADOBENATE DIMEGLUMINE 529 MG/ML IV SOLN: 20.0000 mL | Freq: Once | INTRAVENOUS | Status: DC | PRN

## 2016-10-29 NOTE — Progress Notes (Signed)
  Oncology Nurse Navigator Documentation      )Navigator Encounter Type: Initial MedOnc (10/28/16 1200)   Abnormal Finding Date: 09/26/16 (10/28/16 1200) Confirmed Diagnosis Date: 10/24/16 (10/28/16 1200)                 Treatment Phase: Pre-Tx/Tx Discussion (10/28/16 1200) Barriers/Navigation Needs: Education;Coordination of Care (10/28/16 1200) Education: Understanding Cancer/ Treatment Options (10/28/16 1200) Interventions: Coordination of Care (10/28/16 1200)   Coordination of Care: Appts (10/28/16 1200)        Acuity: Level 2 (10/28/16 1200)   Acuity Level 2: Educational needs;Assistance expediting appointments;Ongoing guidance and education throughout treatment as needed (10/28/16 1200)    met with patient on 10/28/16 during initial visit with Dr. Janese Banks. Treatment options discussed with patient. Pt was given further verbal education regarding treatment options. Will make follow up phone call on Thursday 4/19 to further discuss treatment options with patient and coordinate appts. Pt had no other needs or questions at time of visit.  Time Spent with Patient: 60 (10/28/16 1200)

## 2016-10-30 ENCOUNTER — Telehealth: Payer: Self-pay | Admitting: *Deleted

## 2016-10-30 ENCOUNTER — Other Ambulatory Visit: Payer: Self-pay | Admitting: *Deleted

## 2016-10-30 DIAGNOSIS — C3490 Malignant neoplasm of unspecified part of unspecified bronchus or lung: Secondary | ICD-10-CM

## 2016-10-30 DIAGNOSIS — C3491 Malignant neoplasm of unspecified part of right bronchus or lung: Secondary | ICD-10-CM

## 2016-10-30 NOTE — Telephone Encounter (Signed)
Left message with patient to call me back to discuss treatment options further and to reschedule brain MRI that he could not perform yesterday.

## 2016-10-30 NOTE — Telephone Encounter (Signed)
II sent request for port placement with IR, and faxed paper for request to schedule it

## 2016-10-30 NOTE — Telephone Encounter (Signed)
Spoke with patient regarding treatment options and he has agreed to proceed with concurrent chemo/rads. Pt would like to proceed with port placement and chemo class. He has appt scheduled on Monday 4/23 with Dr. Baruch Gouty. Informed pt that can get all appts scheduled and give to him on Monday when comes in to see Dr. Baruch Gouty. Pt verbalized understanding.  Pt stated that was unable to do brain MRI due to claustrophobia. Informed pt that could try brain mri again with taking medication prior. Pt refused brain mri even with taking medication and would like a more open scan to be performed. Please advise.

## 2016-10-30 NOTE — Telephone Encounter (Signed)
New appt information given to patient regarding CT scan of head. Pt verbalized understanding.

## 2016-10-30 NOTE — Telephone Encounter (Signed)
Error

## 2016-10-30 NOTE — Telephone Encounter (Signed)
Message sent to scheduling to schedule for CT head w/wo contrast and chemo class. Orders entered.   Judeen Hammans, do you mind arranging for port placement? Thanks.

## 2016-10-30 NOTE — Telephone Encounter (Signed)
If he just cant do mri, lets get ct brain with and without contrast. Can we get started on port and chemo class? He will see me the day he starts chemo (on the day of RT start) with cbc, cmp and weeklt carbo/taxol. I will put in the treatment plan

## 2016-10-31 ENCOUNTER — Ambulatory Visit: Admission: RE | Admit: 2016-10-31 | Payer: Medicare Other | Source: Ambulatory Visit

## 2016-11-03 ENCOUNTER — Encounter: Payer: Self-pay | Admitting: Radiation Oncology

## 2016-11-03 ENCOUNTER — Ambulatory Visit
Admission: RE | Admit: 2016-11-03 | Discharge: 2016-11-03 | Disposition: A | Discharge status: Home / Self Care | Payer: Medicare Other | Source: Ambulatory Visit | Attending: Oncology | Admitting: Oncology

## 2016-11-03 ENCOUNTER — Other Ambulatory Visit: Payer: Self-pay | Admitting: Oncology

## 2016-11-03 ENCOUNTER — Ambulatory Visit
Admission: RE | Admit: 2016-11-03 | Discharge: 2016-11-03 | Disposition: A | Discharge status: Home / Self Care | Payer: Medicare Other | Source: Ambulatory Visit | Attending: Radiation Oncology | Admitting: Radiation Oncology

## 2016-11-03 VITALS — BP 137/83 | HR 88 | Temp 98.0°F | Wt 211.0 lb

## 2016-11-03 DIAGNOSIS — M797 Fibromyalgia: Secondary | ICD-10-CM | POA: Insufficient documentation

## 2016-11-03 DIAGNOSIS — Z7984 Long term (current) use of oral hypoglycemic drugs: Secondary | ICD-10-CM | POA: Insufficient documentation

## 2016-11-03 DIAGNOSIS — C3401 Malignant neoplasm of right main bronchus: Secondary | ICD-10-CM | POA: Diagnosis not present

## 2016-11-03 DIAGNOSIS — I10 Essential (primary) hypertension: Secondary | ICD-10-CM | POA: Diagnosis not present

## 2016-11-03 DIAGNOSIS — Z87891 Personal history of nicotine dependence: Secondary | ICD-10-CM | POA: Insufficient documentation

## 2016-11-03 DIAGNOSIS — Z8042 Family history of malignant neoplasm of prostate: Secondary | ICD-10-CM | POA: Diagnosis not present

## 2016-11-03 DIAGNOSIS — Z7982 Long term (current) use of aspirin: Secondary | ICD-10-CM | POA: Diagnosis not present

## 2016-11-03 DIAGNOSIS — C3490 Malignant neoplasm of unspecified part of unspecified bronchus or lung: Secondary | ICD-10-CM

## 2016-11-03 DIAGNOSIS — Z510 Encounter for antineoplastic radiation therapy: Secondary | ICD-10-CM | POA: Insufficient documentation

## 2016-11-03 DIAGNOSIS — E119 Type 2 diabetes mellitus without complications: Secondary | ICD-10-CM | POA: Insufficient documentation

## 2016-11-03 DIAGNOSIS — R609 Edema, unspecified: Secondary | ICD-10-CM | POA: Diagnosis not present

## 2016-11-03 DIAGNOSIS — Z79899 Other long term (current) drug therapy: Secondary | ICD-10-CM | POA: Diagnosis not present

## 2016-11-03 DIAGNOSIS — J449 Chronic obstructive pulmonary disease, unspecified: Secondary | ICD-10-CM | POA: Insufficient documentation

## 2016-11-03 DIAGNOSIS — C3491 Malignant neoplasm of unspecified part of right bronchus or lung: Secondary | ICD-10-CM

## 2016-11-03 DIAGNOSIS — K219 Gastro-esophageal reflux disease without esophagitis: Secondary | ICD-10-CM | POA: Diagnosis not present

## 2016-11-03 DIAGNOSIS — I509 Heart failure, unspecified: Secondary | ICD-10-CM | POA: Diagnosis not present

## 2016-11-03 HISTORY — DX: Malignant (primary) neoplasm, unspecified: C80.1

## 2016-11-03 MED ORDER — IOPAMIDOL (ISOVUE-300) INJECTION 61%
75.0000 mL | Freq: Once | INTRAVENOUS | Status: AC | PRN
  Administered 2016-11-03: 75 mL via INTRAVENOUS

## 2016-11-03 NOTE — Consult Note (Signed)
NEW PATIENT EVALUATION  Name: Chris Wilkinson  MRN: [id number]  Date:   11/03/2016     DOB: 1941-05-07   This 75 y.o. 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  REFERRING PHYSICIAN: McLean-Scocuzza, Olivia Mackie *  CHIEF COMPLAINT:  Chief Complaint  Patient presents with  . Lung Cancer    Pt is here for initial consultation of lung cancer.      DIAGNOSIS: The encounter diagnosis was Malignant neoplasm of right lung, unspecified part of lung ([place]).   PREVIOUS INVESTIGATIONS:  PET CT scan reviewed PET CT scan reviewed Pathology report reviewed Clinical notes reviewed  HPI: Patient is a 76 year old male with history of COPD now on continuous nasal oxygen originally admitted to hospital in March 2018 with pneumonia. At that time CT scan demonstrated a right hilar mass with postobstructive atelectasis. PET CT scan on March 30 showed hypermetabolic right hilar mass with both post obstructive changes of the right upper lobe consistent with bronchogenic carcinoma. Also right paratracheal right supraclavicular nodal metastasis making this and N3 lesion. He underwent bronchoscopy which was positive for non-small cell lung, cancer favoring squamous cell carcinoma. Since his hospitalization he's had significant lower extremity edema not eradicated or diminished with Lasix. He has been seen by medical oncology and recommendation was made for concurrent chemoradiation. He is seen today for consultation. He is having some white productive cough no hemoptysis. He is having no dysphagia. He is in no pain.  PLANNED TREATMENT REGIMEN: Concurrent chemoradiation with radiation given of the split course fashion  PAST MEDICAL HISTORY:  has a past medical history of Cancer ([place]); CHF (congestive heart failure) ([place]); COPD (chronic obstructive pulmonary disease) ([place]); Coronary artery disease; Diabetes  mellitus without complication ([place]); Fibromyalgia; GERD (gastroesophageal reflux disease); and Hypertension.    PAST SURGICAL HISTORY:  Past Surgical History:  Procedure Laterality Date  . COLONOSCOPY WITH PROPOFOL N/A 01/18/2015   Procedure: COLONOSCOPY WITH PROPOFOL;  Surgeon: Manya Silvas, MD;  Location: [facility] ENDOSCOPY;  Service: Endoscopy;  Laterality: N/A;  . ESOPHAGOGASTRODUODENOSCOPY N/A 01/18/2015   Procedure: ESOPHAGOGASTRODUODENOSCOPY (EGD);  Surgeon: Manya Silvas, MD;  Location: [facility] ENDOSCOPY;  Service: Endoscopy;  Laterality: N/A;  . EYE SURGERY    . FLEXIBLE BRONCHOSCOPY N/A 10/22/2016   Procedure: FLEXIBLE BRONCHOSCOPY;  Surgeon: Allyne Gee, MD;  Location: [facility] ORS;  Service: Pulmonary;  Laterality: N/A;  . HERNIA REPAIR    . REPAIR KNEE LIGAMENT    . TONSILLECTOMY      FAMILY HISTORY: family history includes Prostate cancer in his brother and father.  SOCIAL HISTORY:  reports that he quit smoking about 12 years ago. He has never used smokeless tobacco. He reports that he drinks about 1.2 oz of alcohol per week . He reports that he does not use drugs.  ALLERGIES: Patient has no known allergies.  MEDICATIONS:  Current Outpatient Prescriptions  Medication Sig Dispense Refill  . acetaminophen (TYLENOL) 325 MG tablet Take 2 tablets (650 mg total) by mouth every 6 (six) hours as needed for mild pain (or Fever >/= 101).    Marland Kitchen albuterol-ipratropium (COMBIVENT) 18-103 MCG/ACT inhaler Inhale 2 puffs into the lungs every 6 (six) hours as needed for wheezing or shortness of breath.    Marland Kitchen aspirin 325 MG tablet Take 325 mg by mouth daily.    . carvedilol (COREG) 25 MG tablet Take 25 mg by mouth 2 (two) times daily  with a meal.    . Fluticasone Furoate-Vilanterol 100-25 MCG/INH AEPB Inhale 1 puff into the lungs daily.     Marland Kitchen losartan (COZAAR) 100 MG tablet Take 100 mg by mouth daily.    . metFORMIN (GLUCOPHAGE) 500 MG tablet Take 500 mg by mouth 2 (two) times daily.     Marland Kitchen  omeprazole (PRILOSEC) 40 MG capsule Take 40 mg by mouth daily.    . simvastatin (ZOCOR) 10 MG tablet Take 10 mg by mouth every evening.     . sitaGLIPtin (JANUVIA) 50 MG tablet Take 50 mg by mouth daily.    . benzonatate (TESSALON) 200 MG capsule Take 1 capsule (200 mg total) by mouth 3 (three) times daily as needed for cough. (Patient not taking: Reported on 10/16/2016) 20 capsule 0   No current facility-administered medications for this encounter.     ECOG PERFORMANCE STATUS:  1 - Symptomatic but completely ambulatory  REVIEW OF SYSTEMS: Except for the slight white productive cough and use of nasal oxygen Patient denies any weight loss, fatigue, weakness, fever, chills or night sweats. Patient denies any loss of vision, blurred vision. Patient denies any ringing  of the ears or hearing loss. No irregular heartbeat. Patient denies heart murmur or history of fainting. Patient denies any chest pain or pain radiating to her upper extremities. Patient denies any shortness of breath, difficulty breathing at night, cough or hemoptysis. Patient denies any swelling in the lower legs. Patient denies any nausea vomiting, vomiting of blood, or coffee ground material in the vomitus. Patient denies any stomach pain. Patient states has had normal bowel movements no significant constipation or diarrhea. Patient denies any dysuria, hematuria or significant nocturia. Patient denies any problems walking, swelling in the joints or loss of balance. Patient denies any skin changes, loss of hair or loss of weight. Patient denies any excessive worrying or anxiety or significant depression. Patient denies any problems with insomnia. Patient denies excessive thirst, polyuria, polydipsia. Patient denies any swollen glands, patient denies easy bruising or easy bleeding. Patient denies any recent infections, allergies or URI. Patient "s visual fields have not changed significantly in recent time.    PHYSICAL EXAM: BP 137/83    Pulse 88   Temp 98 F (36.7 C)   Wt 210 lb 15.7 oz (95.7 kg)   BMI 29.43 kg/m  Well-developed male in NAD. He is on nasal oxygen. I cannot detect any adenopathy in the right supraclavicular fossa. Well-developed well-nourished patient in NAD. HEENT reveals PERLA, EOMI, discs not visualized.  Oral cavity is clear. No oral mucosal lesions are identified. Neck is clear without evidence of cervical or supraclavicular adenopathy. Lungs are clear to A&P. Cardiac examination is essentially unremarkable with regular rate and rhythm without murmur rub or thrill. Abdomen is benign with no organomegaly or masses noted. Motor sensory and DTR levels are equal and symmetric in the upper and lower extremities. Cranial nerves II through XII are grossly intact. Proprioception is intact. No peripheral adenopathy or edema is identified. No motor or sensory levels are noted. Crude visual fields are within normal range.  LABORATORY DATA: Pathology reports reviewed    RADIOLOGY RESULTS: PET CT scan CT scans and brain CT all reviewed compatible with the above-stated findings   IMPRESSION: Stage IIIB squamous cell carcinoma the right lung in 76 year old male  PLAN: At this time I to go ahead with combined modality treatment with chemoradiation. Based on his atelectasis an extensive disease in the chest would start out with 4140  cGy in 23 fractions to a large field and then evaluate after short break for treatment response and possible further consolidative radiation therapy. Risks and benefits of treatment including possibly developing radiation esophagitis fatigue skin reaction alteration of blood counts possible worsening of his cough all were discussed in detail with the patient. I personally set up and ordered CT simulation for this week.There will be extra effort by both professional staff as well as technical staff to coordinate and manage concurrent chemoradiation and ensuing side effects during his treatments.  Patient and his significant other seem to compress my treatment plan well.   I would like to take this opportunity to thank you for allowing me to participate in the care of your patient.Armstead Peaks., MD

## 2016-11-04 ENCOUNTER — Other Ambulatory Visit: Payer: Self-pay | Admitting: Radiology

## 2016-11-04 ENCOUNTER — Other Ambulatory Visit: Payer: Self-pay | Admitting: Oncology

## 2016-11-04 DIAGNOSIS — C3411 Malignant neoplasm of upper lobe, right bronchus or lung: Secondary | ICD-10-CM

## 2016-11-04 DIAGNOSIS — C3490 Malignant neoplasm of unspecified part of unspecified bronchus or lung: Secondary | ICD-10-CM | POA: Insufficient documentation

## 2016-11-04 MED ORDER — ONDANSETRON HCL 8 MG PO TABS: 8.0000 mg | ORAL_TABLET | Freq: Two times a day (BID) | ORAL | 1 refills | Status: DC | PRN

## 2016-11-04 MED ORDER — DEXAMETHASONE 4 MG PO TABS: 8.0000 mg | ORAL_TABLET | Freq: Every day | ORAL | 1 refills | Status: DC

## 2016-11-04 MED ORDER — LORAZEPAM 0.5 MG PO TABS: 0.5000 mg | ORAL_TABLET | Freq: Four times a day (QID) | ORAL | 0 refills | Status: DC | PRN

## 2016-11-04 MED ORDER — LIDOCAINE-PRILOCAINE 2.5-2.5 % EX CREA: TOPICAL_CREAM | CUTANEOUS | 3 refills | Status: DC

## 2016-11-04 MED ORDER — PROCHLORPERAZINE MALEATE 10 MG PO TABS: 10.0000 mg | ORAL_TABLET | Freq: Four times a day (QID) | ORAL | 1 refills | Status: DC | PRN

## 2016-11-04 NOTE — Patient Instructions (Signed)

## 2016-11-04 NOTE — Progress Notes (Signed)
START ON PATHWAY REGIMEN - Non-Small Cell Lung     Administer weekly:     Paclitaxel      Carboplatin   **Always confirm dose/schedule in your pharmacy ordering system**    Patient Characteristics: Stage III - Unresectable, PS = 0, 1 AJCC T Category: TX Current Disease Status: No Distant Mets or Local Recurrence AJCC N Category: NX AJCC M Category: M0 AJCC 8 Stage Grouping: IIIB Performance Status: PS = 0, 1  Intent of Therapy: Curative Intent, Discussed with Patient

## 2016-11-05 ENCOUNTER — Ambulatory Visit
Admission: RE | Admit: 2016-11-05 | Discharge: 2016-11-05 | Disposition: A | Discharge status: Home / Self Care | Payer: Medicare Other | Source: Ambulatory Visit | Attending: Radiation Oncology | Admitting: Radiation Oncology

## 2016-11-05 DIAGNOSIS — C3401 Malignant neoplasm of right main bronchus: Secondary | ICD-10-CM | POA: Diagnosis not present

## 2016-11-06 ENCOUNTER — Inpatient Hospital Stay: Payer: Medicare Other

## 2016-11-06 ENCOUNTER — Other Ambulatory Visit: Payer: Self-pay | Admitting: General Surgery

## 2016-11-07 ENCOUNTER — Ambulatory Visit
Admission: RE | Admit: 2016-11-07 | Discharge: 2016-11-07 | Disposition: A | Discharge status: Home / Self Care | Payer: Medicare Other | Source: Ambulatory Visit | Attending: Oncology | Admitting: Oncology

## 2016-11-07 ENCOUNTER — Inpatient Hospital Stay: Payer: Medicare Other | Admitting: Oncology

## 2016-11-07 ENCOUNTER — Inpatient Hospital Stay: Payer: Medicare Other

## 2016-11-07 DIAGNOSIS — C3491 Malignant neoplasm of unspecified part of right bronchus or lung: Secondary | ICD-10-CM | POA: Insufficient documentation

## 2016-11-07 HISTORY — PX: IR IMAGING GUIDED PORT INSERTION: IMG5740

## 2016-11-07 LAB — BASIC METABOLIC PANEL
ANION GAP: 7 (ref 5–15)
BUN: 10 mg/dL (ref 6–20)
CALCIUM: 10 mg/dL (ref 8.9–10.3)
CO2: 28 mmol/L (ref 22–32)
CREATININE: 1.11 mg/dL (ref 0.61–1.24)
Chloride: 108 mmol/L (ref 101–111)
GFR calc Af Amer: >60 mL/min (ref >60)
Glucose, Bld: 107 mg/dL — ABNORMAL HIGH (ref 65–99)
Potassium: 4 mmol/L (ref 3.5–5.1)
SODIUM: 143 mmol/L (ref 135–145)

## 2016-11-07 LAB — APTT: APTT: 32 s (ref 24–36)

## 2016-11-07 LAB — PROTIME-INR
INR: 1.26
PROTHROMBIN TIME: 15.9 s — AB (ref 11.4–15.2)

## 2016-11-07 LAB — CBC
HCT: 38.7 % — ABNORMAL LOW (ref 40.0–52.0)
Hemoglobin: 12.8 g/dL — ABNORMAL LOW (ref 13.0–18.0)
MCH: 29.1 pg (ref 26.0–34.0)
MCHC: 33.2 g/dL (ref 32.0–36.0)
MCV: 87.5 fL (ref 80.0–100.0)
PLATELETS: 160 10*3/uL (ref 150–440)
RBC: 4.42 MIL/uL (ref 4.40–5.90)
RDW: 15.7 % — ABNORMAL HIGH (ref 11.5–14.5)
WBC: 5.8 10*3/uL (ref 3.8–10.6)

## 2016-11-07 LAB — GLUCOSE, CAPILLARY: Glucose-Capillary: 98 mg/dL (ref 65–99)

## 2016-11-07 MED ORDER — SODIUM CHLORIDE 0.9 % IV SOLN
INTRAVENOUS | Status: DC
  Administered 2016-11-07: 14:00:00 via INTRAVENOUS

## 2016-11-07 MED ORDER — HEPARIN SOD (PORK) LOCK FLUSH 100 UNIT/ML IV SOLN
500.0000 [IU] | Freq: Once | INTRAVENOUS | Status: DC
  Filled 2016-11-07: qty 5

## 2016-11-07 MED ORDER — FENTANYL CITRATE (PF) 100 MCG/2ML IJ SOLN
INTRAMUSCULAR | Status: AC | PRN
  Administered 2016-11-07: 50 ug via INTRAVENOUS
  Administered 2016-11-07 (×2): 12.5 ug via INTRAVENOUS

## 2016-11-07 MED ORDER — FENTANYL CITRATE (PF) 100 MCG/2ML IJ SOLN
INTRAMUSCULAR | Status: DC
Start: 2016-11-07 — End: 2016-11-08
  Filled 2016-11-07: qty 4

## 2016-11-07 MED ORDER — CEFAZOLIN SODIUM-DEXTROSE 2-4 GM/100ML-% IV SOLN
2.0000 g | INTRAVENOUS | Status: AC
  Administered 2016-11-07: 2 g via INTRAVENOUS
  Filled 2016-11-07 (×3): qty 100

## 2016-11-07 MED ORDER — MIDAZOLAM HCL 5 MG/5ML IJ SOLN
INTRAMUSCULAR | Status: DC
Start: 2016-11-07 — End: 2016-11-08
  Filled 2016-11-07: qty 10

## 2016-11-07 MED ORDER — LIDOCAINE HCL (PF) 1 % IJ SOLN
INTRAMUSCULAR | Status: AC | PRN
  Administered 2016-11-07: 18 mL

## 2016-11-07 MED ORDER — MIDAZOLAM HCL 5 MG/5ML IJ SOLN
INTRAMUSCULAR | Status: AC | PRN
  Administered 2016-11-07 (×2): 1 mg via INTRAVENOUS
  Administered 2016-11-07: 0.5 mg via INTRAVENOUS

## 2016-11-07 NOTE — Procedures (Signed)
LEFT  IJ Port cathter placement with [place] and fluoroscopy No complication No blood loss. See complete dictation in [facility].

## 2016-11-10 DIAGNOSIS — C3401 Malignant neoplasm of right main bronchus: Secondary | ICD-10-CM | POA: Diagnosis not present

## 2016-11-12 ENCOUNTER — Ambulatory Visit
Admission: RE | Admit: 2016-11-12 | Discharge: 2016-11-12 | Disposition: A | Discharge status: Home / Self Care | Payer: Medicare Other | Source: Ambulatory Visit | Attending: Radiation Oncology | Admitting: Radiation Oncology

## 2016-11-12 ENCOUNTER — Encounter: Payer: Self-pay | Admitting: *Deleted

## 2016-11-12 DIAGNOSIS — C3401 Malignant neoplasm of right main bronchus: Secondary | ICD-10-CM | POA: Diagnosis not present

## 2016-11-12 NOTE — Progress Notes (Signed)
  Oncology Nurse Navigator Documentation  Navigator Location: [facility] (11/12/16 1300)   )Navigator Encounter Type: Lobby (11/12/16 1300)                             Interventions: Referrals (11/12/16 1300) Referrals: Social Work (11/12/16 1300)       met with patient in lobby prior to radiation appointment. Reviewed future appts with pt and his wife. Pt's wife expressed concerns about paying bills and having trouble financially. Informed pt's wife that can refer them to [facility] who can discuss these concerns with them further. Pt's wife verbalized understanding.             Time Spent with Patient: 15 (11/12/16 1300)

## 2016-11-13 ENCOUNTER — Ambulatory Visit
Admission: RE | Admit: 2016-11-13 | Discharge: 2016-11-13 | Disposition: A | Discharge status: Home / Self Care | Payer: Medicare Other | Source: Ambulatory Visit | Attending: Radiation Oncology | Admitting: Radiation Oncology

## 2016-11-13 ENCOUNTER — Inpatient Hospital Stay: Payer: Medicare Other

## 2016-11-13 ENCOUNTER — Ambulatory Visit: Payer: Medicare Other

## 2016-11-13 ENCOUNTER — Inpatient Hospital Stay: Payer: Medicare Other | Attending: Oncology | Admitting: Oncology

## 2016-11-13 ENCOUNTER — Encounter: Payer: Self-pay | Admitting: Oncology

## 2016-11-13 VITALS — BP 104/66 | HR 93 | Temp 98.5°F | Resp 18 | Wt 212.3 lb

## 2016-11-13 VITALS — BP 105/65 | HR 87 | Resp 18

## 2016-11-13 DIAGNOSIS — K219 Gastro-esophageal reflux disease without esophagitis: Secondary | ICD-10-CM | POA: Diagnosis not present

## 2016-11-13 DIAGNOSIS — Z5111 Encounter for antineoplastic chemotherapy: Secondary | ICD-10-CM | POA: Insufficient documentation

## 2016-11-13 DIAGNOSIS — J449 Chronic obstructive pulmonary disease, unspecified: Secondary | ICD-10-CM | POA: Insufficient documentation

## 2016-11-13 DIAGNOSIS — Z79899 Other long term (current) drug therapy: Secondary | ICD-10-CM

## 2016-11-13 DIAGNOSIS — C3491 Malignant neoplasm of unspecified part of right bronchus or lung: Secondary | ICD-10-CM

## 2016-11-13 DIAGNOSIS — Z87891 Personal history of nicotine dependence: Secondary | ICD-10-CM | POA: Diagnosis not present

## 2016-11-13 DIAGNOSIS — I110 Hypertensive heart disease with heart failure: Secondary | ICD-10-CM | POA: Diagnosis not present

## 2016-11-13 DIAGNOSIS — C3411 Malignant neoplasm of upper lobe, right bronchus or lung: Secondary | ICD-10-CM

## 2016-11-13 DIAGNOSIS — K5900 Constipation, unspecified: Secondary | ICD-10-CM | POA: Diagnosis not present

## 2016-11-13 DIAGNOSIS — Y842 Radiological procedure and radiotherapy as the cause of abnormal reaction of the patient, or of later complication, without mention of misadventure at the time of the procedure: Secondary | ICD-10-CM | POA: Diagnosis not present

## 2016-11-13 DIAGNOSIS — I509 Heart failure, unspecified: Secondary | ICD-10-CM | POA: Diagnosis not present

## 2016-11-13 DIAGNOSIS — I2510 Atherosclerotic heart disease of native coronary artery without angina pectoris: Secondary | ICD-10-CM | POA: Insufficient documentation

## 2016-11-13 DIAGNOSIS — M797 Fibromyalgia: Secondary | ICD-10-CM | POA: Insufficient documentation

## 2016-11-13 DIAGNOSIS — Z7982 Long term (current) use of aspirin: Secondary | ICD-10-CM | POA: Insufficient documentation

## 2016-11-13 DIAGNOSIS — Z7984 Long term (current) use of oral hypoglycemic drugs: Secondary | ICD-10-CM | POA: Insufficient documentation

## 2016-11-13 DIAGNOSIS — C3401 Malignant neoplasm of right main bronchus: Secondary | ICD-10-CM | POA: Diagnosis not present

## 2016-11-13 DIAGNOSIS — E119 Type 2 diabetes mellitus without complications: Secondary | ICD-10-CM | POA: Insufficient documentation

## 2016-11-13 LAB — CBC WITH DIFFERENTIAL/PLATELET
BASOS ABS: 0.1 10*3/uL (ref 0–0.1)
BASOS PCT: 1 %
EOS PCT: 1 %
Eosinophils Absolute: 0.1 10*3/uL (ref 0–0.7)
HCT: 34.7 % — ABNORMAL LOW (ref 40.0–52.0)
Hemoglobin: 11.9 g/dL — ABNORMAL LOW (ref 13.0–18.0)
LYMPHS PCT: 19 %
Lymphs Abs: 1.6 10*3/uL (ref 1.0–3.6)
MCH: 29.5 pg (ref 26.0–34.0)
MCHC: 34.3 g/dL (ref 32.0–36.0)
MCV: 85.8 fL (ref 80.0–100.0)
MONO ABS: 1.5 10*3/uL — AB (ref 0.2–1.0)
Monocytes Relative: 18 %
NEUTROS ABS: 5.1 10*3/uL (ref 1.4–6.5)
Neutrophils Relative %: 61 %
PLATELETS: 171 10*3/uL (ref 150–440)
RBC: 4.04 MIL/uL — ABNORMAL LOW (ref 4.40–5.90)
RDW: 15.2 % — AB (ref 11.5–14.5)
WBC: 8.3 10*3/uL (ref 3.8–10.6)

## 2016-11-13 LAB — COMPREHENSIVE METABOLIC PANEL
ALBUMIN: 3.1 g/dL — AB (ref 3.5–5.0)
ALT: 17 U/L (ref 17–63)
AST: 18 U/L (ref 15–41)
Alkaline Phosphatase: 92 U/L (ref 38–126)
Anion gap: 6 (ref 5–15)
BUN: 12 mg/dL (ref 6–20)
CHLORIDE: 108 mmol/L (ref 101–111)
CO2: 25 mmol/L (ref 22–32)
Calcium: 9.6 mg/dL (ref 8.9–10.3)
Creatinine, Ser: 1.1 mg/dL (ref 0.61–1.24)
GFR calc Af Amer: >60 mL/min (ref >60)
GFR calc non Af Amer: >60 mL/min (ref >60)
GLUCOSE: 156 mg/dL — AB (ref 65–99)
POTASSIUM: 3.9 mmol/L (ref 3.5–5.1)
Sodium: 139 mmol/L (ref 135–145)
Total Bilirubin: 0.8 mg/dL (ref 0.3–1.2)
Total Protein: 7.9 g/dL (ref 6.5–8.1)

## 2016-11-13 MED ORDER — DIPHENHYDRAMINE HCL 50 MG/ML IJ SOLN
50.0000 mg | Freq: Once | INTRAMUSCULAR | Status: AC
  Administered 2016-11-13: 50 mg via INTRAVENOUS
  Filled 2016-11-13: qty 1

## 2016-11-13 MED ORDER — SODIUM CHLORIDE 0.9 % IV SOLN
Freq: Once | INTRAVENOUS | Status: AC
  Administered 2016-11-13: 09:00:00 via INTRAVENOUS
  Filled 2016-11-13: qty 1000

## 2016-11-13 MED ORDER — SODIUM CHLORIDE 0.9% FLUSH
10.0000 mL | INTRAVENOUS | Status: DC | PRN
  Administered 2016-11-13: 10 mL
  Filled 2016-11-13: qty 10

## 2016-11-13 MED ORDER — SODIUM CHLORIDE 0.9 % IV SOLN
20.0000 mg | Freq: Once | INTRAVENOUS | Status: AC
  Administered 2016-11-13: 20 mg via INTRAVENOUS
  Filled 2016-11-13: qty 2

## 2016-11-13 MED ORDER — DEXTROSE 5 % IV SOLN
45.0000 mg/m2 | Freq: Once | INTRAVENOUS | Status: AC
  Administered 2016-11-13: 96 mg via INTRAVENOUS
  Filled 2016-11-13: qty 16

## 2016-11-13 MED ORDER — SODIUM CHLORIDE 0.9 % IV SOLN
209.6000 mg | Freq: Once | INTRAVENOUS | Status: AC
  Administered 2016-11-13: 210 mg via INTRAVENOUS
  Filled 2016-11-13: qty 21

## 2016-11-13 MED ORDER — PALONOSETRON HCL INJECTION 0.25 MG/5ML
0.2500 mg | Freq: Once | INTRAVENOUS | Status: AC
  Administered 2016-11-13: 0.25 mg via INTRAVENOUS
  Filled 2016-11-13: qty 5

## 2016-11-13 MED ORDER — FAMOTIDINE IN NACL 20-0.9 MG/50ML-% IV SOLN
20.0000 mg | Freq: Once | INTRAVENOUS | Status: AC
  Administered 2016-11-13: 20 mg via INTRAVENOUS
  Filled 2016-11-13: qty 50

## 2016-11-13 MED ORDER — HEPARIN SOD (PORK) LOCK FLUSH 100 UNIT/ML IV SOLN
500.0000 [IU] | Freq: Once | INTRAVENOUS | Status: AC | PRN
  Administered 2016-11-13: 500 [IU]
  Filled 2016-11-13: qty 5

## 2016-11-13 NOTE — Progress Notes (Signed)
Is here for first treatment.  Is concerned about lower extremity edema that he has had since hospital day.  Has discussed with PCP but states he has no answers.

## 2016-11-13 NOTE — Progress Notes (Signed)
Hematology/Oncology Consult note [facility]  [phone] [phone]  Patient Care Team: Nino Glow McLean-Scocuzza, MD as PCP - General (Internal Medicine)   Name of the patient: Chris Wilkinson  [id number]  02/28/1941   Date of visit: 11/13/16  Diagnosis- SCC of RUL Stage IIIB T1N3M0  Chief complaint/ Reason for visit- on treatment assessment prior to cycle 1 of chemotherapy  Heme/Onc history: 1. Patient is a 76 year old African-American male with a past medical history significant for COPD for which she sees Dr. Humphrey Rolls. He was recently admitted to the hospital in March 2018 with symptoms of pneumonia. CT thorax at that time showed: Perihilar lung mass with associated postobstructive atelectasis and consolidation of the right upper lobe  2. This was followed by a PET CT scan on 10/10/2016 which showed:1. Hypermetabolic RIGHT hilar mass with postobstructive collapse of the RIGHT upper lobe consistent with primary or metastatic bronchogenic carcinoma. 2. Hypermetabolic RIGHT paratracheal, prevascular and RIGHT supraclavicular nodal metastasis. 3. RIGHT supraclavicular node is intensely metabolic but likely too small for biopsy. 4. RIGHT lower lobe peripheral nodular thickening with associated metabolic activity is likely inflammatory. 5. Severe bullous change in the LEFT hemithorax  3.   CT abdomen from 10/10/2016 showed no evidence of metastatic disease.  4. 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 Patient has problems with bilateral lower extremity swelling as well as shortness of breath on minimal exertion. He uses nighttime oxygen. He follows up with Dr. Humphrey Rolls from pulmonary as well as cardiology  Interval history- continues to have problems with leg swelling. Denies other complaints  ECOG PS- 2 Pain scale- 0 Opioid  associated constipation- no  Review of systems- Review of Systems  Constitutional: Positive for malaise/fatigue. Negative for chills, fever and weight loss.  HENT: Negative for congestion, ear discharge and nosebleeds.   Eyes: Negative for blurred vision.  Respiratory: Positive for shortness of breath. Negative for cough, hemoptysis, sputum production and wheezing.   Cardiovascular: Positive for leg swelling. Negative for chest pain, palpitations, orthopnea and claudication.  Gastrointestinal: Negative for abdominal pain, blood in stool, constipation, diarrhea, heartburn, melena, nausea and vomiting.  Genitourinary: Negative for dysuria, flank pain, frequency, hematuria and urgency.  Musculoskeletal: Negative for back pain, joint pain and myalgias.  Skin: Negative for rash.  Neurological: Negative for dizziness, tingling, focal weakness, seizures, weakness and headaches.  Endo/Heme/Allergies: Does not bruise/bleed easily.  Psychiatric/Behavioral: Negative for depression and suicidal ideas. The patient does not have insomnia.        No Known Allergies   Past Medical History:  Diagnosis Date  . Cancer ([place])    Lung CA  . CHF (congestive heart failure) ([place])   . COPD (chronic obstructive pulmonary disease) ([place])   . Coronary artery disease   . Diabetes mellitus without complication ([place])   . Fibromyalgia   . GERD (gastroesophageal reflux disease)   . Hypertension      Past Surgical History:  Procedure Laterality Date  . COLONOSCOPY WITH PROPOFOL N/A 01/18/2015   Procedure: COLONOSCOPY WITH PROPOFOL;  Surgeon: Manya Silvas, MD;  Location: [facility] ENDOSCOPY;  Service: Endoscopy;  Laterality: N/A;  . ESOPHAGOGASTRODUODENOSCOPY N/A 01/18/2015   Procedure: ESOPHAGOGASTRODUODENOSCOPY (EGD);  Surgeon: Manya Silvas, MD;  Location: [facility] ENDOSCOPY;  Service: Endoscopy;  Laterality: N/A;  . EYE SURGERY    . FLEXIBLE BRONCHOSCOPY N/A 10/22/2016   Procedure: FLEXIBLE BRONCHOSCOPY;  Surgeon: Allyne Gee, MD;  Location: [facility] ORS;  Service: Pulmonary;  Laterality: N/A;  . HERNIA REPAIR    . IR FLUORO GUIDE PORT INSERTION LEFT  11/07/2016  . REPAIR KNEE LIGAMENT    . TONSILLECTOMY      Social History   Social History  . Marital status: Married    Spouse name: N/A  . Number of children: N/A  . Years of education: N/A   Occupational History  . Not on file.   Social History Main Topics  . Smoking status: Former Smoker    Quit date: 12/01/2003  . Smokeless tobacco: Never Used  . Alcohol use 1.2 oz/week    2 Shots of liquor per week  . Drug use: No  . Sexual activity: Not on file   Other Topics Concern  . Not on file   Social History Narrative  . No narrative on file    Family History  Problem Relation Age of Onset  . Prostate cancer Father   . Prostate cancer Brother   . Diabetes      all 13 siblings  . Hypertension      all 13 siblings     Current Outpatient Prescriptions:  .  acetaminophen (TYLENOL) 325 MG tablet, Take 2 tablets (650 mg total) by mouth every 6 (six) hours as needed for mild pain (or Fever >/= 101)., Disp: , Rfl:  .  albuterol-ipratropium (COMBIVENT) 18-103 MCG/ACT inhaler, Inhale 2 puffs into the lungs every 6 (six) hours as needed for wheezing or shortness of breath., Disp: , Rfl:  .  aspirin 325 MG tablet, Take 325 mg by mouth daily., Disp: , Rfl:  .  carvedilol (COREG) 25 MG tablet, Take 25 mg by mouth 2 (two) times daily with a meal., Disp: , Rfl:  .  dexamethasone (DECADRON) 4 MG tablet, Take 2 tablets (8 mg total) by mouth daily. Start the day after chemotherapy for 2 days., Disp: 30 tablet, Rfl: 1 .  Fluticasone Furoate-Vilanterol 100-25 MCG/INH AEPB, Inhale 1 puff into the lungs daily. , Disp: , Rfl:  .  lidocaine-prilocaine (EMLA) cream, Apply to affected area once, Disp: 30 g, Rfl: 3 .  LORazepam (ATIVAN) 0.5 MG tablet, Take 1 tablet (0.5 mg total) by mouth every 6 (six) hours as needed (Nausea or vomiting)., Disp: 30  tablet, Rfl: 0 .  losartan (COZAAR) 100 MG tablet, Take 100 mg by mouth daily., Disp: , Rfl:  .  metFORMIN (GLUCOPHAGE) 500 MG tablet, Take 500 mg by mouth 2 (two) times daily. , Disp: , Rfl:  .  ondansetron (ZOFRAN) 8 MG tablet, Take 1 tablet (8 mg total) by mouth 2 (two) times daily as needed for refractory nausea / vomiting. Start on day 3 after chemo., Disp: 30 tablet, Rfl: 1 .  prochlorperazine (COMPAZINE) 10 MG tablet, Take 1 tablet (10 mg total) by mouth every 6 (six) hours as needed (Nausea or vomiting)., Disp: 30 tablet, Rfl: 1 .  simvastatin (ZOCOR) 10 MG tablet, Take 10 mg by mouth every evening. , Disp: , Rfl:  .  sitaGLIPtin (JANUVIA) 50 MG tablet, Take 50 mg by mouth daily., Disp: , Rfl:  .  benzonatate (TESSALON) 200 MG capsule, Take 1 capsule (200 mg total) by mouth 3 (three) times daily as needed for cough. (Patient not taking: Reported on 10/16/2016), Disp: 20 capsule, Rfl: 0 .  omeprazole (PRILOSEC) 40 MG capsule, Take 40 mg by mouth daily., Disp: , Rfl:  No current facility-administered medications for this visit.  Facility-Administered Medications Ordered in Other Visits:  .  CARBOplatin (PARAPLATIN) 210 mg in sodium chloride 0.9 % 250 mL chemo infusion, 210 mg, Intravenous, Once, Sindy Guadeloupe, MD .  heparin lock flush 100 unit/mL, 500 Units, Intracatheter, Once PRN, Sindy Guadeloupe, MD .  sodium chloride flush (NS) 0.9 % injection 10 mL, 10 mL, Intracatheter, PRN, Sindy Guadeloupe, MD, 10 mL at 11/13/16 0925  Physical exam:  Vitals:   11/13/16 0854  BP: 104/66  Pulse: 93  Resp: 18  Temp: 98.5 F (36.9 C)  TempSrc: Tympanic  Weight: 212 lb 4.8 oz (96.3 kg)   Physical Exam  Constitutional: He is oriented to person, place, and time and well-developed, well-nourished, and in no distress.  HENT:  Head: Normocephalic and atraumatic.  Eyes: EOM are normal. Pupils are equal, round, and reactive to light.  Neck: Normal range of motion.  Cardiovascular: Normal rate, regular  rhythm and normal heart sounds.   Pulmonary/Chest: Effort normal and breath sounds normal.  Abdominal: Soft. Bowel sounds are normal.  Musculoskeletal: He exhibits edema.  Neurological: He is alert and oriented to person, place, and time.  Skin: Skin is warm and dry.     CMP Latest Ref Rng & Units 11/13/2016  Glucose 65 - 99 mg/dL 156(H)  BUN 6 - 20 mg/dL 12  Creatinine 0.61 - 1.24 mg/dL 1.10  Sodium 135 - 145 mmol/L 139  Potassium 3.5 - 5.1 mmol/L 3.9  Chloride 101 - 111 mmol/L 108  CO2 22 - 32 mmol/L 25  Calcium 8.9 - 10.3 mg/dL 9.6  Total Protein 6.5 - 8.1 g/dL 7.9  Total Bilirubin 0.3 - 1.2 mg/dL 0.8  Alkaline Phos 38 - 126 U/L 92  AST 15 - 41 U/L 18  ALT 17 - 63 U/L 17   CBC Latest Ref Rng & Units 11/13/2016  WBC 3.8 - 10.6 K/uL 8.3  Hemoglobin 13.0 - 18.0 g/dL 11.9(L)  Hematocrit 40.0 - 52.0 % 34.7(L)  Platelets 150 - 440 K/uL 171    No images are attached to the encounter.  Ct Head W Wo Contrast  Result Date: 11/03/2016 CLINICAL DATA:  76 year old male recently diagnosed with lung cancer. Staging. EXAM: CT HEAD WITHOUT AND WITH CONTRAST TECHNIQUE: Contiguous axial images were obtained from the base of the skull through the vertex without and with intravenous contrast CONTRAST:  36m ISOVUE-300 IOPAMIDOL (ISOVUE-300) INJECTION 61% COMPARISON:  PET-CT 10/10/2016 FINDINGS: Brain: Cerebral volume is within normal limits for age. No midline shift, ventriculomegaly, mass effect, evidence of mass lesion, intracranial hemorrhage or evidence of cortically based acute infarction. Gray-white matter differentiation is within normal limits throughout the brain. No abnormal enhancement identified. Vascular: Mild Calcified atherosclerosis at the skull base. Major intracranial vascular structures are enhancing. Skull: Bone mineralization is within normal limits. No osseous abnormality identified. Sinuses/Orbits: Clear. Other: Visualized orbit soft tissues are within normal limits. Scalp soft  tissues are within normal limits. IMPRESSION: No metastatic disease or acute intracranial abnormality. Normal for age CT appearance of the brain. Electronically Signed   By: HGenevie AnnM.D.   On: 11/03/2016 09:06   Ir Fluoro Guide Port Insertion Left  Result Date: 11/07/2016 CLINICAL DATA:  Right lung carcinoma, planned chemotherapy and radiation therapy. Durable venous access is requested. EXAM: TUNNELED PORT CATHETER PLACEMENT WITH ULTRASOUND AND FLUOROSCOPIC GUIDANCE FLUOROSCOPY TIME:  0.2 minutes, 5 mGy ANESTHESIA/SEDATION: Intravenous Fentanyl and Versed were administered as conscious sedation during continuous monitoring of the patient's level of consciousness and physiological / cardiorespiratory  status by the radiology RN, with a total moderate sedation time of 22 minutes. TECHNIQUE: The procedure, risks, benefits, and alternatives were explained to the patient. Questions regarding the procedure were encouraged and answered. The patient understands and consents to the procedure. As antibiotic prophylaxis, cefazolin 2 g was ordered pre-procedure and administered intravenously within one hour of incision. A left-sided approach was selected because of right-sided lesion and plans for regional radiation therapy. Patency of the left IJ vein was confirmed with ultrasound with image documentation. An appropriate skin site was determined. Skin site was marked. Region was prepped using maximum barrier technique including cap and mask, sterile gown, sterile gloves, large sterile sheet, and Chlorhexidine as cutaneous antisepsis. The region was infiltrated locally with 1% lidocaine. Under real-time ultrasound guidance, the left IJ vein was accessed with a 21 gauge micropuncture needle; the needle tip within the vein was confirmed with ultrasound image documentation. Needle was exchanged over a 018 guidewire for transitional dilator which allowed passage of the [facility] wire into the IVC. Over this, the transitional  dilator was exchanged for a 5 [place] MPA catheter. A small incision was made on the left anterior chest wall and a subcutaneous pocket fashioned. The power-injectable port was positioned and its catheter tunneled to the left IJ dermatotomy site. The MPA catheter was exchanged over an Amplatz wire for a peel-away sheath, through which the port catheter, which had been trimmed to the appropriate length, was advanced and positioned under fluoroscopy with its tip at the cavoatrial junction. Spot chest radiograph confirms good catheter position and no pneumothorax. Paucity of lung markings, stable since prior chest radiograph 09/25/2016. The pocket was closed with deep interrupted and subcuticular continuous 3-0 Monocryl sutures. The port was flushed per protocol. The incisions were covered with Dermabond then covered with a sterile dressing. COMPLICATIONS: COMPLICATIONS None immediate IMPRESSION: Technically successful left IJ power-injectable port catheter placement. Ready for routine use. Electronically Signed   By: Lucrezia Europe M.D.   On: 11/07/2016 15:13     Assessment and plan- Patient is a 76 y.o. male with h/o newly diagnosed SCC of RUL Stage IIIB T1N3M0  Counts ok to proceed with cycle 1 of carbo/taxol weekly concurrent with RT today. Port site looks good. No signs of infection. I will see him back in 1 week prior to cycle 2. He knows to take prn nausea meds for chemo induced nausea vomiting  Leg edema- chronic issue- f/u with cardiology and pulmonary   Visit Diagnosis 1. Malignant neoplasm of upper lobe of right lung ([place])   2. Encounter for antineoplastic chemotherapy      Dr. Randa Evens, MD, MPH [facility] at [facility] Pager- [phone] 11/13/2016 11:54 AM

## 2016-11-14 ENCOUNTER — Ambulatory Visit
Admission: RE | Admit: 2016-11-14 | Discharge: 2016-11-14 | Disposition: A | Discharge status: Home / Self Care | Payer: Medicare Other | Source: Ambulatory Visit | Attending: Radiation Oncology | Admitting: Radiation Oncology

## 2016-11-14 DIAGNOSIS — C3401 Malignant neoplasm of right main bronchus: Secondary | ICD-10-CM | POA: Diagnosis not present

## 2016-11-17 ENCOUNTER — Ambulatory Visit
Admission: RE | Admit: 2016-11-17 | Discharge: 2016-11-17 | Disposition: A | Discharge status: Home / Self Care | Payer: Medicare Other | Source: Ambulatory Visit | Attending: Radiation Oncology | Admitting: Radiation Oncology

## 2016-11-17 DIAGNOSIS — C3401 Malignant neoplasm of right main bronchus: Secondary | ICD-10-CM | POA: Diagnosis not present

## 2016-11-18 ENCOUNTER — Encounter: Payer: Self-pay | Admitting: *Deleted

## 2016-11-18 ENCOUNTER — Ambulatory Visit
Admission: RE | Admit: 2016-11-18 | Discharge: 2016-11-18 | Disposition: A | Discharge status: Home / Self Care | Payer: Medicare Other | Source: Ambulatory Visit | Attending: Radiation Oncology | Admitting: Radiation Oncology

## 2016-11-18 DIAGNOSIS — C3401 Malignant neoplasm of right main bronchus: Secondary | ICD-10-CM | POA: Diagnosis not present

## 2016-11-18 NOTE — Progress Notes (Signed)
  Oncology Nurse Navigator Documentation  Navigator Location: [facility] (11/18/16 0900)   )Navigator Encounter Type: Telephone (11/18/16 0900)                         Barriers/Navigation Needs: No barriers at this time;No Questions;No Needs (11/18/16 0900)             Called pt to follow up after receiving first chemo last week. Spoke with pt's wife who stated that they patient did well and has not experienced any side effects from treatment. Informed pt's wife to call if has any further needs.              Time Spent with Patient: 15 (11/18/16 0900)

## 2016-11-19 ENCOUNTER — Ambulatory Visit
Admission: RE | Admit: 2016-11-19 | Discharge: 2016-11-19 | Disposition: A | Discharge status: Home / Self Care | Payer: Medicare Other | Source: Ambulatory Visit | Attending: Radiation Oncology | Admitting: Radiation Oncology

## 2016-11-19 DIAGNOSIS — C3401 Malignant neoplasm of right main bronchus: Secondary | ICD-10-CM | POA: Diagnosis not present

## 2016-11-20 ENCOUNTER — Inpatient Hospital Stay: Payer: Medicare Other

## 2016-11-20 ENCOUNTER — Encounter: Payer: Self-pay | Admitting: Oncology

## 2016-11-20 ENCOUNTER — Ambulatory Visit
Admission: RE | Admit: 2016-11-20 | Discharge: 2016-11-20 | Disposition: A | Discharge status: Home / Self Care | Payer: Medicare Other | Source: Ambulatory Visit | Attending: Radiation Oncology | Admitting: Radiation Oncology

## 2016-11-20 ENCOUNTER — Inpatient Hospital Stay (HOSPITAL_BASED_OUTPATIENT_CLINIC_OR_DEPARTMENT_OTHER): Payer: Medicare Other | Admitting: Oncology

## 2016-11-20 VITALS — BP 98/64 | HR 102 | Temp 98.3°F | Wt 207.5 lb

## 2016-11-20 DIAGNOSIS — Z5111 Encounter for antineoplastic chemotherapy: Secondary | ICD-10-CM | POA: Diagnosis not present

## 2016-11-20 DIAGNOSIS — Z79899 Other long term (current) drug therapy: Secondary | ICD-10-CM

## 2016-11-20 DIAGNOSIS — C3411 Malignant neoplasm of upper lobe, right bronchus or lung: Secondary | ICD-10-CM

## 2016-11-20 DIAGNOSIS — C3401 Malignant neoplasm of right main bronchus: Secondary | ICD-10-CM | POA: Diagnosis not present

## 2016-11-20 LAB — COMPREHENSIVE METABOLIC PANEL
ALBUMIN: 3 g/dL — AB (ref 3.5–5.0)
ALK PHOS: 93 U/L (ref 38–126)
ALT: 22 U/L (ref 17–63)
ANION GAP: 4 — AB (ref 5–15)
AST: 17 U/L (ref 15–41)
BILIRUBIN TOTAL: 0.7 mg/dL (ref 0.3–1.2)
BUN: 23 mg/dL — ABNORMAL HIGH (ref 6–20)
CALCIUM: 9.9 mg/dL (ref 8.9–10.3)
CO2: 26 mmol/L (ref 22–32)
Chloride: 106 mmol/L (ref 101–111)
Creatinine, Ser: 1.17 mg/dL (ref 0.61–1.24)
GFR calc non Af Amer: 60 mL/min — ABNORMAL LOW (ref >60)
GLUCOSE: 151 mg/dL — AB (ref 65–99)
POTASSIUM: 4.3 mmol/L (ref 3.5–5.1)
SODIUM: 136 mmol/L (ref 135–145)
Total Protein: 7.9 g/dL (ref 6.5–8.1)

## 2016-11-20 LAB — CBC WITH DIFFERENTIAL/PLATELET
Basophils Absolute: 0 10*3/uL (ref 0–0.1)
Basophils Relative: 1 %
EOS ABS: 0.2 10*3/uL (ref 0–0.7)
Eosinophils Relative: 4 %
HCT: 35.5 % — ABNORMAL LOW (ref 40.0–52.0)
Hemoglobin: 11.9 g/dL — ABNORMAL LOW (ref 13.0–18.0)
LYMPHS ABS: 1 10*3/uL (ref 1.0–3.6)
LYMPHS PCT: 23 %
MCH: 28.9 pg (ref 26.0–34.0)
MCHC: 33.4 g/dL (ref 32.0–36.0)
MCV: 86.5 fL (ref 80.0–100.0)
MONO ABS: 0.4 10*3/uL (ref 0.2–1.0)
MONOS PCT: 10 %
Neutro Abs: 2.7 10*3/uL (ref 1.4–6.5)
Neutrophils Relative %: 62 %
Platelets: 193 10*3/uL (ref 150–440)
RBC: 4.1 MIL/uL — ABNORMAL LOW (ref 4.40–5.90)
RDW: 15 % — AB (ref 11.5–14.5)
WBC: 4.3 10*3/uL (ref 3.8–10.6)

## 2016-11-20 MED ORDER — DIPHENHYDRAMINE HCL 50 MG/ML IJ SOLN
50.0000 mg | Freq: Once | INTRAMUSCULAR | Status: AC
  Administered 2016-11-20: 50 mg via INTRAVENOUS
  Filled 2016-11-20: qty 1

## 2016-11-20 MED ORDER — SODIUM CHLORIDE 0.9% FLUSH
10.0000 mL | Freq: Once | INTRAVENOUS | Status: AC
  Administered 2016-11-20: 10 mL via INTRAVENOUS
  Filled 2016-11-20: qty 10

## 2016-11-20 MED ORDER — PACLITAXEL CHEMO INJECTION 300 MG/50ML
45.0000 mg/m2 | Freq: Once | INTRAVENOUS | Status: AC
  Administered 2016-11-20: 96 mg via INTRAVENOUS
  Filled 2016-11-20: qty 16

## 2016-11-20 MED ORDER — HEPARIN SOD (PORK) LOCK FLUSH 100 UNIT/ML IV SOLN
500.0000 [IU] | Freq: Once | INTRAVENOUS | Status: AC
  Administered 2016-11-20: 500 [IU] via INTRAVENOUS
  Filled 2016-11-20: qty 5

## 2016-11-20 MED ORDER — SODIUM CHLORIDE 0.9 % IV SOLN
20.0000 mg | Freq: Once | INTRAVENOUS | Status: AC
  Administered 2016-11-20: 20 mg via INTRAVENOUS
  Filled 2016-11-20: qty 2

## 2016-11-20 MED ORDER — FAMOTIDINE IN NACL 20-0.9 MG/50ML-% IV SOLN
20.0000 mg | Freq: Once | INTRAVENOUS | Status: AC
  Administered 2016-11-20: 20 mg via INTRAVENOUS
  Filled 2016-11-20: qty 50

## 2016-11-20 MED ORDER — PALONOSETRON HCL INJECTION 0.25 MG/5ML
0.2500 mg | Freq: Once | INTRAVENOUS | Status: AC
  Administered 2016-11-20: 0.25 mg via INTRAVENOUS
  Filled 2016-11-20: qty 5

## 2016-11-20 MED ORDER — CARBOPLATIN CHEMO INJECTION 450 MG/45ML
210.0000 mg | Freq: Once | INTRAVENOUS | Status: AC
  Administered 2016-11-20: 210 mg via INTRAVENOUS
  Filled 2016-11-20: qty 21

## 2016-11-20 MED ORDER — SODIUM CHLORIDE 0.9 % IV SOLN
Freq: Once | INTRAVENOUS | Status: AC
  Administered 2016-11-20: 11:00:00 via INTRAVENOUS
  Filled 2016-11-20: qty 1000

## 2016-11-20 NOTE — Progress Notes (Signed)
Patient here for follow up. Treatment check. He has been prescribed lasix by his cardiologist.

## 2016-11-20 NOTE — Progress Notes (Signed)
Hematology/Oncology Consult note 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 Patient is a 76 year old African-American male with a past medical history significant for COPD for which she sees Dr. Humphrey Rolls. He was recently admitted to the hospital in March 2018 with symptoms of pneumonia. CT thorax at that time showed: Perihilar lung mass with associated postobstructive atelectasis and consolidation of the right upper lobe  2. This was followed by a PET CT scan on 10/10/2016 which showed:1. Hypermetabolic RIGHT hilar mass with postobstructive collapse of the RIGHT upper lobe consistent with primary or metastatic bronchogenic carcinoma. 2. Hypermetabolic RIGHT paratracheal, prevascular and RIGHT supraclavicular nodal metastasis. 3. RIGHT supraclavicular node is intensely metabolic but likely too small for biopsy. 4. RIGHT lower lobe peripheral nodular thickening with associated metabolic activity is likely inflammatory. 5. Severe bullous change in the LEFT hemithorax  3. CT abdomen from 10/10/2016 showed no evidence of metastatic disease.  4. 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 Patient has problems with bilateral lower extremity swelling as well as shortness of breath on minimal exertion. He uses nighttime oxygen. He follows up with Dr. Floyce Stakes pulmonary as well as cardiology  6. Concurrent chemo/RT started on 11/13/16  Interval history- shortness of breath and leg swelling is improving. Overall  patient feels well and denies any complaints today  ECOG PS- 1 Pain scale- 0 Opioid associated constipation- no  Review of systems- Review of Systems  Constitutional: Negative for chills, fever, malaise/fatigue and weight loss.  HENT: Negative for congestion, ear discharge and nosebleeds.   Eyes: Negative for blurred vision.  Respiratory: Negative for cough, hemoptysis, sputum production, shortness of breath and wheezing.   Cardiovascular: Positive for leg swelling. Negative for chest pain, palpitations, orthopnea and claudication.  Gastrointestinal: Negative for abdominal pain, blood in stool, constipation, diarrhea, heartburn, melena, nausea and vomiting.  Genitourinary: Negative for dysuria, flank pain, frequency, hematuria and urgency.  Musculoskeletal: Negative for back pain, joint pain and myalgias.  Skin: Negative for rash.  Neurological: Negative for dizziness, tingling, focal weakness, seizures, weakness and headaches.  Endo/Heme/Allergies: Does not bruise/bleed easily.  Psychiatric/Behavioral: Negative for depression and suicidal ideas. The patient does not have insomnia.       No Known Allergies   Past Medical History:  Diagnosis Date  . Cancer ([place])    Lung CA  . CHF (congestive heart failure) ([place])   . COPD (chronic obstructive pulmonary disease) ([place])   . Coronary artery disease   . Diabetes mellitus without complication ([place])   . Fibromyalgia   . GERD (gastroesophageal reflux disease)   . Hypertension      Past Surgical History:  Procedure Laterality Date  . COLONOSCOPY WITH PROPOFOL N/A 01/18/2015   Procedure: COLONOSCOPY WITH PROPOFOL;  Surgeon: Manya Silvas, MD;  Location[facility] ENDOSCOPY;  Service: Endoscopy;  Laterality: N/A;  . ESOPHAGOGASTRODUODENOSCOPY N/A 01/18/2015   Procedure: ESOPHAGOGASTRODUODENOSCOPY (EGD);  Surgeon: Manya Silvas, MD;  Location: [facility] ENDOSCOPY;  Service: Endoscopy;  Laterality: N/A;  . EYE SURGERY    . FLEXIBLE  BRONCHOSCOPY  N/A 10/22/2016   Procedure: FLEXIBLE BRONCHOSCOPY;  Surgeon: Allyne Gee, MD;  Location: [facility] ORS;  Service: Pulmonary;  Laterality: N/A;  . HERNIA REPAIR    . IR FLUORO GUIDE PORT INSERTION LEFT  11/07/2016  . REPAIR KNEE LIGAMENT    . TONSILLECTOMY      Social History   Social History  . Marital status: Married    Spouse name: N/A  . Number of children: N/A  . Years of education: N/A   Occupational History  . Not on file.   Social History Main Topics  . Smoking status: Former Smoker    Quit date: 12/01/2003  . Smokeless tobacco: Never Used  . Alcohol use 1.2 oz/week    2 Shots of liquor per week  . Drug use: No  . Sexual activity: Not on file   Other Topics Concern  . Not on file   Social History Narrative  . No narrative on file    Family History  Problem Relation Age of Onset  . Prostate cancer Father   . Prostate cancer Brother   . Diabetes Unknown        all 13 siblings  . Hypertension Unknown        all 13 siblings     Current Outpatient Prescriptions:  .  acetaminophen (TYLENOL) 325 MG tablet, Take 2 tablets (650 mg total) by mouth every 6 (six) hours as needed for mild pain (or Fever >/= 101)., Disp: , Rfl:  .  albuterol-ipratropium (COMBIVENT) 18-103 MCG/ACT inhaler, Inhale 2 puffs into the lungs every 6 (six) hours as needed for wheezing or shortness of breath., Disp: , Rfl:  .  aspirin 325 MG tablet, Take 325 mg by mouth daily., Disp: , Rfl:  .  benzonatate (TESSALON) 200 MG capsule, Take 1 capsule (200 mg total) by mouth 3 (three) times daily as needed for cough. (Patient not taking: Reported on 10/16/2016), Disp: 20 capsule, Rfl: 0 .  carvedilol (COREG) 25 MG tablet, Take 25 mg by mouth 2 (two) times daily with a meal., Disp: , Rfl:  .  dexamethasone (DECADRON) 4 MG tablet, Take 2 tablets (8 mg total) by mouth daily. Start the day after chemotherapy for 2 days., Disp: 30 tablet, Rfl: 1 .  Fluticasone Furoate-Vilanterol 100-25 MCG/INH AEPB,  Inhale 1 puff into the lungs daily. , Disp: , Rfl:  .  lidocaine-prilocaine (EMLA) cream, Apply to affected area once, Disp: 30 g, Rfl: 3 .  LORazepam (ATIVAN) 0.5 MG tablet, Take 1 tablet (0.5 mg total) by mouth every 6 (six) hours as needed (Nausea or vomiting)., Disp: 30 tablet, Rfl: 0 .  losartan (COZAAR) 100 MG tablet, Take 100 mg by mouth daily., Disp: , Rfl:  .  metFORMIN (GLUCOPHAGE) 500 MG tablet, Take 500 mg by mouth 2 (two) times daily. , Disp: , Rfl:  .  omeprazole (PRILOSEC) 40 MG capsule, Take 40 mg by mouth daily., Disp: , Rfl:  .  ondansetron (ZOFRAN) 8 MG tablet, Take 1 tablet (8 mg total) by mouth 2 (two) times daily as needed for refractory nausea / vomiting. Start on day 3 after chemo., Disp: 30 tablet, Rfl: 1 .  prochlorperazine (COMPAZINE) 10 MG tablet, Take 1 tablet (10 mg total) by mouth every 6 (six) hours as needed (Nausea or vomiting)., Disp: 30 tablet, Rfl: 1 .  simvastatin (ZOCOR) 10 MG tablet, Take 10 mg by mouth every evening. , Disp: , Rfl:  .  sitaGLIPtin (JANUVIA) 50 MG tablet, Take 50 mg  by mouth daily., Disp: , Rfl:   Physical exam:  Vitals:   11/20/16 1024  BP: 98/64  Pulse: (!) 102  Temp: 98.3 F (36.8 C)  TempSrc: Tympanic  Weight: 207 lb 8 oz (94.1 kg)   Physical Exam  Constitutional: He is oriented to person, place, and time and well-developed, well-nourished, and in no distress.  HENT:  Head: Normocephalic and atraumatic.  Eyes: EOM are normal. Pupils are equal, round, and reactive to light.  Neck: Normal range of motion.  Cardiovascular: Normal rate, regular rhythm and normal heart sounds.   Pulmonary/Chest: Effort normal and breath sounds normal.  Abdominal: Soft. Bowel sounds are normal.  Musculoskeletal: He exhibits edema (improved  b/l trace).  Neurological: He is alert and oriented to person, place, and time.  Skin: Skin is warm and dry.     CMP Latest Ref Rng & Units 11/13/2016  Glucose 65 - 99 mg/dL 156(H)  BUN 6 - 20 mg/dL 12    Creatinine 0.61 - 1.24 mg/dL 1.10  Sodium 135 - 145 mmol/L 139  Potassium 3.5 - 5.1 mmol/L 3.9  Chloride 101 - 111 mmol/L 108  CO2 22 - 32 mmol/L 25  Calcium 8.9 - 10.3 mg/dL 9.6  Total Protein 6.5 - 8.1 g/dL 7.9  Total Bilirubin 0.3 - 1.2 mg/dL 0.8  Alkaline Phos 38 - 126 U/L 92  AST 15 - 41 U/L 18  ALT 17 - 63 U/L 17   CBC Latest Ref Rng & Units 11/13/2016  WBC 3.8 - 10.6 K/uL 8.3  Hemoglobin 13.0 - 18.0 g/dL 11.9(L)  Hematocrit 40.0 - 52.0 % 34.7(L)  Platelets 150 - 440 K/uL 171    No images are attached to the encounter.  Ct Head W Wo Contrast  Result Date: 11/03/2016 CLINICAL DATA:  76 year old male recently diagnosed with lung cancer. Staging. EXAM: CT HEAD WITHOUT AND WITH CONTRAST TECHNIQUE: Contiguous axial images were obtained from the base of the skull through the vertex without and with intravenous contrast CONTRAST:  24m ISOVUE-300 IOPAMIDOL (ISOVUE-300) INJECTION 61% COMPARISON:  PET-CT 10/10/2016 FINDINGS: Brain: Cerebral volume is within normal limits for age. No midline shift, ventriculomegaly, mass effect, evidence of mass lesion, intracranial hemorrhage or evidence of cortically based acute infarction. Gray-white matter differentiation is within normal limits throughout the brain. No abnormal enhancement identified. Vascular: Mild Calcified atherosclerosis at the skull base. Major intracranial vascular structures are enhancing. Skull: Bone mineralization is within normal limits. No osseous abnormality identified. Sinuses/Orbits: Clear. Other: Visualized orbit soft tissues are within normal limits. Scalp soft tissues are within normal limits. IMPRESSION: No metastatic disease or acute intracranial abnormality. Normal for age CT appearance of the brain. Electronically Signed   By: HGenevie AnnM.D.   On: 11/03/2016 09:06   Ir Fluoro Guide Port Insertion Left  Result Date: 11/07/2016 CLINICAL DATA:  Right lung carcinoma, planned chemotherapy and radiation therapy. Durable  venous access is requested. EXAM: TUNNELED PORT CATHETER PLACEMENT WITH ULTRASOUND AND FLUOROSCOPIC GUIDANCE FLUOROSCOPY TIME:  0.2 minutes, 5 mGy ANESTHESIA/SEDATION: Intravenous Fentanyl and Versed were administered as conscious sedation during continuous monitoring of the patient's level of consciousness and physiological / cardiorespiratory status by the radiology RN, with a total moderate sedation time of 22 minutes. TECHNIQUE: The procedure, risks, benefits, and alternatives were explained to the patient. Questions regarding the procedure were encouraged and answered. The patient understands and consents to the procedure. As antibiotic prophylaxis, cefazolin 2 g was ordered pre-procedure and administered intravenously within one hour of incision. A  left-sided approach was selected because of right-sided lesion and plans for regional radiation therapy. Patency of the left IJ vein was confirmed with ultrasound with image documentation. An appropriate skin site was determined. Skin site was marked. Region was prepped using maximum barrier technique including cap and mask, sterile gown, sterile gloves, large sterile sheet, and Chlorhexidine as cutaneous antisepsis. The region was infiltrated locally with 1% lidocaine. Under real-time ultrasound guidance, the left IJ vein was accessed with a 21 gauge micropuncture needle; the needle tip within the vein was confirmed with ultrasound image documentation. Needle was exchanged over a 018 guidewire for transitional dilator which allowed passage of the [facility] wire into the IVC. Over this, the transitional dilator was exchanged for a 5 [place] MPA catheter. A small incision was made on the left anterior chest wall and a subcutaneous pocket fashioned. The power-injectable port was positioned and its catheter tunneled to the left IJ dermatotomy site. The MPA catheter was exchanged over an Amplatz wire for a peel-away sheath, through which the port catheter, which had been  trimmed to the appropriate length, was advanced and positioned under fluoroscopy with its tip at the cavoatrial junction. Spot chest radiograph confirms good catheter position and no pneumothorax. Paucity of lung markings, stable since prior chest radiograph 09/25/2016. The pocket was closed with deep interrupted and subcuticular continuous 3-0 Monocryl sutures. The port was flushed per protocol. The incisions were covered with Dermabond then covered with a sterile dressing. COMPLICATIONS: COMPLICATIONS None immediate IMPRESSION: Technically successful left IJ power-injectable port catheter placement. Ready for routine use. Electronically Signed   By: Lucrezia Europe M.D.   On: 11/07/2016 15:13     Assessment and plan- Patient is a 76 y.o. male with h/o newly diagnosed SCC of RUL Stage IIIB T1N3M0  Counts ok to proceed with cycle 2 of carbo/taxol weekly concurrent with RT today. Patient reports no side effects from chemotherapy. He will proceed with cycle 3 of chemo next week and I will see him back in2 weeks time with cbc cmp prirot oc cyle 4 of chemotherapy.   Visit Diagnosis 1. Malignant neoplasm of upper lobe of right lung ([place])   2. Encounter for antineoplastic chemotherapy      Dr. Randa Evens, MD, MPH [facility] at [facility] Pager- [phone] 11/20/2016 12:24 PM

## 2016-11-21 ENCOUNTER — Ambulatory Visit
Admission: RE | Admit: 2016-11-21 | Discharge: 2016-11-21 | Disposition: A | Discharge status: Home / Self Care | Payer: Medicare Other | Source: Ambulatory Visit | Attending: Radiation Oncology | Admitting: Radiation Oncology

## 2016-11-21 DIAGNOSIS — C3401 Malignant neoplasm of right main bronchus: Secondary | ICD-10-CM | POA: Diagnosis not present

## 2016-11-24 ENCOUNTER — Ambulatory Visit
Admission: RE | Admit: 2016-11-24 | Discharge: 2016-11-24 | Disposition: A | Discharge status: Home / Self Care | Payer: Medicare Other | Source: Ambulatory Visit | Attending: Radiation Oncology | Admitting: Radiation Oncology

## 2016-11-24 DIAGNOSIS — C3401 Malignant neoplasm of right main bronchus: Secondary | ICD-10-CM | POA: Diagnosis not present

## 2016-11-25 ENCOUNTER — Ambulatory Visit
Admission: RE | Admit: 2016-11-25 | Discharge: 2016-11-25 | Disposition: A | Discharge status: Home / Self Care | Payer: Medicare Other | Source: Ambulatory Visit | Attending: Radiation Oncology | Admitting: Radiation Oncology

## 2016-11-25 DIAGNOSIS — C3401 Malignant neoplasm of right main bronchus: Secondary | ICD-10-CM | POA: Diagnosis not present

## 2016-11-26 ENCOUNTER — Ambulatory Visit
Admission: RE | Admit: 2016-11-26 | Discharge: 2016-11-26 | Disposition: A | Discharge status: Home / Self Care | Payer: Medicare Other | Source: Ambulatory Visit | Attending: Radiation Oncology | Admitting: Radiation Oncology

## 2016-11-26 DIAGNOSIS — C3401 Malignant neoplasm of right main bronchus: Secondary | ICD-10-CM | POA: Diagnosis not present

## 2016-11-27 ENCOUNTER — Other Ambulatory Visit: Payer: Self-pay | Admitting: Oncology

## 2016-11-27 ENCOUNTER — Ambulatory Visit
Admission: RE | Admit: 2016-11-27 | Discharge: 2016-11-27 | Disposition: A | Discharge status: Home / Self Care | Payer: Medicare Other | Source: Ambulatory Visit | Attending: Radiation Oncology | Admitting: Radiation Oncology

## 2016-11-27 ENCOUNTER — Inpatient Hospital Stay: Payer: Medicare Other

## 2016-11-27 VITALS — BP 103/67 | HR 96 | Temp 98.1°F | Resp 18

## 2016-11-27 DIAGNOSIS — C3411 Malignant neoplasm of upper lobe, right bronchus or lung: Secondary | ICD-10-CM

## 2016-11-27 DIAGNOSIS — Z5111 Encounter for antineoplastic chemotherapy: Secondary | ICD-10-CM

## 2016-11-27 DIAGNOSIS — C3401 Malignant neoplasm of right main bronchus: Secondary | ICD-10-CM | POA: Diagnosis not present

## 2016-11-27 LAB — CBC WITH DIFFERENTIAL/PLATELET
BASOS PCT: 1 %
Basophils Absolute: 0 10*3/uL (ref 0–0.1)
Eosinophils Absolute: 0.1 10*3/uL (ref 0–0.7)
Eosinophils Relative: 2 %
HCT: 34.8 % — ABNORMAL LOW (ref 40.0–52.0)
HEMOGLOBIN: 11.6 g/dL — AB (ref 13.0–18.0)
Lymphocytes Relative: 17 %
Lymphs Abs: 0.5 10*3/uL — ABNORMAL LOW (ref 1.0–3.6)
MCH: 29.2 pg (ref 26.0–34.0)
MCHC: 33.5 g/dL (ref 32.0–36.0)
MCV: 87.3 fL (ref 80.0–100.0)
Monocytes Absolute: 0.4 10*3/uL (ref 0.2–1.0)
Monocytes Relative: 14 %
NEUTROS ABS: 2.1 10*3/uL (ref 1.4–6.5)
NEUTROS PCT: 66 %
Platelets: 137 10*3/uL — ABNORMAL LOW (ref 150–440)
RBC: 3.98 MIL/uL — AB (ref 4.40–5.90)
RDW: 14.9 % — ABNORMAL HIGH (ref 11.5–14.5)
WBC: 3.1 10*3/uL — AB (ref 3.8–10.6)

## 2016-11-27 LAB — COMPREHENSIVE METABOLIC PANEL
ALBUMIN: 3.1 g/dL — AB (ref 3.5–5.0)
ALK PHOS: 101 U/L (ref 38–126)
ALT: 19 U/L (ref 17–63)
AST: 19 U/L (ref 15–41)
Anion gap: 4 — ABNORMAL LOW (ref 5–15)
BILIRUBIN TOTAL: 0.5 mg/dL (ref 0.3–1.2)
BUN: 25 mg/dL — AB (ref 6–20)
CO2: 24 mmol/L (ref 22–32)
CREATININE: 1.38 mg/dL — AB (ref 0.61–1.24)
Calcium: 9.4 mg/dL (ref 8.9–10.3)
Chloride: 109 mmol/L (ref 101–111)
GFR calc Af Amer: 57 mL/min — ABNORMAL LOW (ref >60)
GFR calc non Af Amer: 49 mL/min — ABNORMAL LOW (ref >60)
GLUCOSE: 198 mg/dL — AB (ref 65–99)
Potassium: 4.8 mmol/L (ref 3.5–5.1)
SODIUM: 137 mmol/L (ref 135–145)
Total Protein: 7.6 g/dL (ref 6.5–8.1)

## 2016-11-27 MED ORDER — HEPARIN SOD (PORK) LOCK FLUSH 100 UNIT/ML IV SOLN
500.0000 [IU] | Freq: Once | INTRAVENOUS | Status: AC | PRN
  Administered 2016-11-27: 500 [IU]
  Filled 2016-11-27: qty 5

## 2016-11-27 MED ORDER — DIPHENHYDRAMINE HCL 50 MG/ML IJ SOLN
50.0000 mg | Freq: Once | INTRAMUSCULAR | Status: AC
  Administered 2016-11-27: 50 mg via INTRAVENOUS
  Filled 2016-11-27: qty 1

## 2016-11-27 MED ORDER — SODIUM CHLORIDE 0.9 % IV SOLN
20.0000 mg | Freq: Once | INTRAVENOUS | Status: AC
  Administered 2016-11-27: 20 mg via INTRAVENOUS
  Filled 2016-11-27: qty 2

## 2016-11-27 MED ORDER — FAMOTIDINE IN NACL 20-0.9 MG/50ML-% IV SOLN
20.0000 mg | Freq: Once | INTRAVENOUS | Status: AC
  Administered 2016-11-27: 20 mg via INTRAVENOUS
  Filled 2016-11-27: qty 50

## 2016-11-27 MED ORDER — PACLITAXEL CHEMO INJECTION 300 MG/50ML
45.0000 mg/m2 | Freq: Once | INTRAVENOUS | Status: AC
  Administered 2016-11-27: 96 mg via INTRAVENOUS
  Filled 2016-11-27: qty 16

## 2016-11-27 MED ORDER — PALONOSETRON HCL INJECTION 0.25 MG/5ML
0.2500 mg | Freq: Once | INTRAVENOUS | Status: AC
  Administered 2016-11-27: 0.25 mg via INTRAVENOUS
  Filled 2016-11-27: qty 5

## 2016-11-27 MED ORDER — CARBOPLATIN CHEMO INJECTION 450 MG/45ML
180.0000 mg | Freq: Once | INTRAVENOUS | Status: AC
  Administered 2016-11-27: 180 mg via INTRAVENOUS
  Filled 2016-11-27: qty 18

## 2016-11-27 MED ORDER — SODIUM CHLORIDE 0.9 % IV SOLN
Freq: Once | INTRAVENOUS | Status: AC
  Administered 2016-11-27: 11:00:00 via INTRAVENOUS
  Filled 2016-11-27: qty 1000

## 2016-11-28 ENCOUNTER — Ambulatory Visit
Admission: RE | Admit: 2016-11-28 | Discharge: 2016-11-28 | Disposition: A | Discharge status: Home / Self Care | Payer: Medicare Other | Source: Ambulatory Visit | Attending: Radiation Oncology | Admitting: Radiation Oncology

## 2016-11-28 DIAGNOSIS — C3401 Malignant neoplasm of right main bronchus: Secondary | ICD-10-CM | POA: Diagnosis not present

## 2016-11-30 ENCOUNTER — Emergency Department: Payer: Medicare Other

## 2016-11-30 ENCOUNTER — Ambulatory Visit: Payer: Medicare Other

## 2016-11-30 ENCOUNTER — Emergency Department
Admission: EM | Admit: 2016-11-30 | Discharge: 2016-11-30 | Disposition: A | Discharge status: Home / Self Care | Payer: Medicare Other | Attending: Emergency Medicine | Admitting: Emergency Medicine

## 2016-11-30 ENCOUNTER — Encounter: Payer: Self-pay | Admitting: Emergency Medicine

## 2016-11-30 DIAGNOSIS — J069 Acute upper respiratory infection, unspecified: Secondary | ICD-10-CM | POA: Diagnosis not present

## 2016-11-30 DIAGNOSIS — I509 Heart failure, unspecified: Secondary | ICD-10-CM | POA: Diagnosis not present

## 2016-11-30 DIAGNOSIS — Z7984 Long term (current) use of oral hypoglycemic drugs: Secondary | ICD-10-CM | POA: Diagnosis not present

## 2016-11-30 DIAGNOSIS — Z79899 Other long term (current) drug therapy: Secondary | ICD-10-CM | POA: Diagnosis not present

## 2016-11-30 DIAGNOSIS — J029 Acute pharyngitis, unspecified: Secondary | ICD-10-CM

## 2016-11-30 DIAGNOSIS — C3490 Malignant neoplasm of unspecified part of unspecified bronchus or lung: Secondary | ICD-10-CM | POA: Diagnosis not present

## 2016-11-30 DIAGNOSIS — I110 Hypertensive heart disease with heart failure: Secondary | ICD-10-CM | POA: Insufficient documentation

## 2016-11-30 DIAGNOSIS — E119 Type 2 diabetes mellitus without complications: Secondary | ICD-10-CM | POA: Insufficient documentation

## 2016-11-30 DIAGNOSIS — I2510 Atherosclerotic heart disease of native coronary artery without angina pectoris: Secondary | ICD-10-CM | POA: Diagnosis not present

## 2016-11-30 DIAGNOSIS — Z87891 Personal history of nicotine dependence: Secondary | ICD-10-CM | POA: Insufficient documentation

## 2016-11-30 DIAGNOSIS — Z7982 Long term (current) use of aspirin: Secondary | ICD-10-CM | POA: Diagnosis not present

## 2016-11-30 DIAGNOSIS — J449 Chronic obstructive pulmonary disease, unspecified: Secondary | ICD-10-CM | POA: Diagnosis not present

## 2016-11-30 LAB — COMPREHENSIVE METABOLIC PANEL
ALBUMIN: 3.2 g/dL — AB (ref 3.5–5.0)
ALT: 20 U/L (ref 17–63)
AST: 17 U/L (ref 15–41)
Alkaline Phosphatase: 97 U/L (ref 38–126)
Anion gap: 6 (ref 5–15)
BUN: 22 mg/dL — ABNORMAL HIGH (ref 6–20)
CHLORIDE: 110 mmol/L (ref 101–111)
CO2: 21 mmol/L — ABNORMAL LOW (ref 22–32)
Calcium: 9.4 mg/dL (ref 8.9–10.3)
Creatinine, Ser: 1.15 mg/dL (ref 0.61–1.24)
GFR calc Af Amer: >60 mL/min (ref >60)
GFR calc non Af Amer: >60 mL/min (ref >60)
GLUCOSE: 229 mg/dL — AB (ref 65–99)
POTASSIUM: 4.5 mmol/L (ref 3.5–5.1)
Sodium: 137 mmol/L (ref 135–145)
Total Bilirubin: 0.9 mg/dL (ref 0.3–1.2)
Total Protein: 7.6 g/dL (ref 6.5–8.1)

## 2016-11-30 LAB — CBC WITH DIFFERENTIAL/PLATELET
BASOS ABS: 0 10*3/uL (ref 0–0.1)
BASOS PCT: 0 %
EOS PCT: 1 %
Eosinophils Absolute: 0 10*3/uL (ref 0–0.7)
HEMATOCRIT: 36.6 % — AB (ref 40.0–52.0)
Hemoglobin: 12.2 g/dL — ABNORMAL LOW (ref 13.0–18.0)
Lymphocytes Relative: 9 %
Lymphs Abs: 0.4 10*3/uL — ABNORMAL LOW (ref 1.0–3.6)
MCH: 29.1 pg (ref 26.0–34.0)
MCHC: 33.2 g/dL (ref 32.0–36.0)
MCV: 87.5 fL (ref 80.0–100.0)
MONO ABS: 0.4 10*3/uL (ref 0.2–1.0)
Monocytes Relative: 10 %
NEUTROS ABS: 3.2 10*3/uL (ref 1.4–6.5)
Neutrophils Relative %: 80 %
PLATELETS: 130 10*3/uL — AB (ref 150–440)
RBC: 4.18 MIL/uL — ABNORMAL LOW (ref 4.40–5.90)
RDW: 15.4 % — AB (ref 11.5–14.5)
WBC: 4 10*3/uL (ref 3.8–10.6)

## 2016-11-30 LAB — MONONUCLEOSIS SCREEN: Mono Screen: NEGATIVE

## 2016-11-30 MED ORDER — ACETAMINOPHEN 500 MG PO TABS
1000.0000 mg | ORAL_TABLET | Freq: Once | ORAL | Status: AC
  Administered 2016-11-30: 1000 mg via ORAL
  Filled 2016-11-30: qty 2

## 2016-11-30 MED ORDER — ONDANSETRON HCL 4 MG/2ML IJ SOLN
4.0000 mg | Freq: Once | INTRAMUSCULAR | Status: AC
  Administered 2016-11-30: 4 mg via INTRAVENOUS
  Filled 2016-11-30: qty 2

## 2016-11-30 MED ORDER — SODIUM CHLORIDE 0.9 % IV BOLUS (SEPSIS)
1000.0000 mL | Freq: Once | INTRAVENOUS | Status: AC
  Administered 2016-11-30: 1000 mL via INTRAVENOUS

## 2016-11-30 NOTE — ED Notes (Signed)

## 2016-11-30 NOTE — ED Triage Notes (Signed)
Sore throat and headache since Thursday.   Currently receiving Chemo and radiation for lung cancer.

## 2016-11-30 NOTE — ED Provider Notes (Signed)
[facility] Emergency Department Provider Note  ____________________________________________  Time seen: Approximately 1:45 PM  I have reviewed the triage vital signs and the nursing notes.   HISTORY  Chief Complaint Sore Throat   HPI Chris Wilkinson is a 76 y.o. male with a history of recently diagnosed lung cancer on chemotherapy and radiation, CHF, COPD, diabetes and hypertension who presents for evaluation of sore throat. Patient reports that he has had sore throat, headache, chills, and nausea for 3 days. No fevers. He also has a cough that is productive of yellow phlegm. No chest pain or shortness of breath, no abdominal pain, no vomiting, no dysuria or hematuria. Patient reports that his sore throat is 7 out of 10, constant, worse when he tries to swallow, worse on the right side. No hoarseness.Patient also reports a frontal pressure-like headache that is only present when he is coughing. As soon as he stops coughing the headache goes away. No headache at this time.  Past Medical History:  Diagnosis Date  . Cancer ([place])    Lung CA  . CHF (congestive heart failure) ([place])   . COPD (chronic obstructive pulmonary disease) ([place])   . Coronary artery disease   . Diabetes mellitus without complication ([place])   . Fibromyalgia   . GERD (gastroesophageal reflux disease)   . Hypertension     Patient Active Problem List   Diagnosis Date Noted  . Goals of care, counseling/discussion 11/20/2016  . Lung cancer ([place]) 11/04/2016  . HCAP (healthcare-associated pneumonia) 09/22/2016  . Sepsis ([place]) 08/04/2015  . CAP (community acquired pneumonia) 08/04/2015  . COPD (chronic obstructive pulmonary disease) ([place]) 08/04/2015  . Type 2 diabetes mellitus ([place]) 08/04/2015  . CAD (coronary artery disease) 08/04/2015  . HTN (hypertension) 08/04/2015  . GERD (gastroesophageal reflux disease) 08/04/2015    Past Surgical History:  Procedure Laterality Date  .  COLONOSCOPY WITH PROPOFOL N/A 01/18/2015   Procedure: COLONOSCOPY WITH PROPOFOL;  Surgeon: Manya Silvas, MD;  Location: [facility] ENDOSCOPY;  Service: Endoscopy;  Laterality: N/A;  . ESOPHAGOGASTRODUODENOSCOPY N/A 01/18/2015   Procedure: ESOPHAGOGASTRODUODENOSCOPY (EGD);  Surgeon: Manya Silvas, MD;  Location: [facility] ENDOSCOPY;  Service: Endoscopy;  Laterality: N/A;  . EYE SURGERY    . FLEXIBLE BRONCHOSCOPY N/A 10/22/2016   Procedure: FLEXIBLE BRONCHOSCOPY;  Surgeon: Allyne Gee, MD;  Location: [facility] ORS;  Service: Pulmonary;  Laterality: N/A;  . HERNIA REPAIR    . IR FLUORO GUIDE PORT INSERTION LEFT  11/07/2016  . REPAIR KNEE LIGAMENT    . TONSILLECTOMY      Prior to Admission medications   Medication Sig Start Date End Date Taking? Authorizing Provider  acetaminophen (TYLENOL) 325 MG tablet Take 2 tablets (650 mg total) by mouth every 6 (six) hours as needed for mild pain (or Fever >/= 101). 09/26/16   [name], MD  albuterol-ipratropium (COMBIVENT) 18-103 MCG/ACT inhaler Inhale 2 puffs into the lungs every 6 (six) hours as needed for wheezing or shortness of breath.    [provider]  aspirin 325 MG tablet Take 325 mg by mouth daily.    [provider]  benzonatate (TESSALON) 200 MG capsule Take 1 capsule (200 mg total) by mouth 3 (three) times daily as needed for cough. 09/26/16   [name], MD  carvedilol (COREG) 25 MG tablet Take 25 mg by mouth 2 (two) times daily with a meal.    [provider]  dexamethasone (DECADRON) 4 MG tablet Take 2 tablets (8 mg total)  by mouth daily. Start the day after chemotherapy for 2 days. 11/04/16   Sindy Guadeloupe, MD  Fluticasone Furoate-Vilanterol 100-25 MCG/INH AEPB Inhale 1 puff into the lungs daily.     [provider]  furosemide (LASIX) 40 MG tablet Take 40 mg by mouth daily.    [provider]  lidocaine-prilocaine (EMLA) cream Apply to affected area once 11/04/16   Sindy Guadeloupe, MD  LORazepam (ATIVAN)  0.5 MG tablet Take 1 tablet (0.5 mg total) by mouth every 6 (six) hours as needed (Nausea or vomiting). 11/04/16   Sindy Guadeloupe, MD  losartan (COZAAR) 100 MG tablet Take 100 mg by mouth daily.    [provider]  metFORMIN (GLUCOPHAGE) 500 MG tablet Take 500 mg by mouth 2 (two) times daily.     [provider]  omeprazole (PRILOSEC) 40 MG capsule Take 40 mg by mouth daily.    [provider]  ondansetron (ZOFRAN) 8 MG tablet Take 1 tablet (8 mg total) by mouth 2 (two) times daily as needed for refractory nausea / vomiting. Start on day 3 after chemo. 11/04/16   Sindy Guadeloupe, MD  prochlorperazine (COMPAZINE) 10 MG tablet Take 1 tablet (10 mg total) by mouth every 6 (six) hours as needed (Nausea or vomiting). 11/04/16   Sindy Guadeloupe, MD  simvastatin (ZOCOR) 10 MG tablet Take 10 mg by mouth every evening.     [provider]  sitaGLIPtin (JANUVIA) 50 MG tablet Take 50 mg by mouth daily.    [provider]    Allergies Patient has no known allergies.  Family History  Problem Relation Age of Onset  . Prostate cancer Father   . Prostate cancer Brother   . Diabetes Unknown        all 13 siblings  . Hypertension Unknown        all 13 siblings    Social History Social History  Substance Use Topics  . Smoking status: Former Smoker    Quit date: 12/01/2003  . Smokeless tobacco: Never Used  . Alcohol use 1.2 oz/week    2 Shots of liquor per week    Review of Systems  Constitutional: Negative for fever. + chills Eyes: Negative for visual changes. ENT: +sore throat. Neck: No neck pain  Cardiovascular: Negative for chest pain. Respiratory: Negative for shortness of breath. Gastrointestinal: Negative for abdominal pain, vomiting or diarrhea. + nausea Genitourinary: Negative for dysuria. Musculoskeletal: Negative for back pain. Skin: Negative for rash. Neurological: Negative for weakness or numbness. + HA Psych: No SI or  HI  ____________________________________________   PHYSICAL EXAM:  VITAL SIGNS: ED Triage Vitals  Enc Vitals Group     BP 11/30/16 0947 95/62     Pulse Rate 11/30/16 0947 (!) 108     Resp 11/30/16 0947 18     Temp 11/30/16 0947 98.2 F (36.8 C)     Temp Source 11/30/16 0947 Oral     SpO2 11/30/16 0947 94 %     Weight 11/30/16 0947 210 lb (95.3 kg)     Height 11/30/16 0947 _0  (1.803 m)     Head Circumference --      Peak Flow --      Pain Score 11/30/16 0958 0     Pain Loc --      Pain Edu? --      Excl. in [place]? --     Constitutional: Alert and oriented. Well appearing and in no apparent distress.  HEENT:      Head: Normocephalic and atraumatic.         Eyes: Conjunctivae are normal. Sclera is non-icteric.       Mouth/Throat: Mucous membranes are moist. Oropharynx is clear, no tonsillar hypertrophy, no exudates, no evidence of peritonsillar abscess       Ear: TMs visualized bilaterally with no erythema or bulging      Neck: Supple with no signs of meningismus, no cervical lymphadenopathy Cardiovascular: Regular rate and rhythm. No murmurs, gallops, or rubs. 2+ symmetrical distal pulses are present in all extremities. No JVD. Respiratory: Normal respiratory effort. Lungs are clear to auscultation bilaterally. No wheezes, crackles, or rhonchi.  Gastrointestinal: Soft, non tender, and non distended with positive bowel sounds. No rebound or guarding. Genitourinary: No CVA tenderness. Musculoskeletal: Nontender with normal range of motion in all extremities. No edema, cyanosis, or erythema of extremities. Neurologic: Normal speech and language. Face is symmetric. Moving all extremities. No gross focal neurologic deficits are appreciated. Skin: Skin is warm, dry and intact. No rash noted. Psychiatric: Mood and affect are normal. Speech and behavior are normal.  ____________________________________________   LABS (all labs ordered are listed, but only abnormal results are  displayed)  Labs Reviewed  CBC WITH DIFFERENTIAL/PLATELET - Abnormal; Notable for the following:       Result Value   RBC 4.18 (*)    Hemoglobin 12.2 (*)    HCT 36.6 (*)    RDW 15.4 (*)    Platelets 130 (*)    Lymphs Abs 0.4 (*)    All other components within normal limits  COMPREHENSIVE METABOLIC PANEL - Abnormal; Notable for the following:    CO2 21 (*)    Glucose, Bld 229 (*)    BUN 22 (*)    Albumin 3.2 (*)    All other components within normal limits  CULTURE, GROUP A STREP [facility])  MONONUCLEOSIS SCREEN   ____________________________________________  EKG  none  ____________________________________________  RADIOLOGY  CXR: 1. No acute cardiopulmonary disease. 2. There has been some improvement with the right upper lobe consolidation/postobstructive change decreased in density from the prior studies, along with less volume loss noted on the right. No other change.  Neck XR: No acute abnormalities identified ____________________________________________   PROCEDURES  Procedure(s) performed: None Procedures Critical Care performed:  None ____________________________________________   INITIAL IMPRESSION / ASSESSMENT AND PLAN / ED COURSE  76 y.o. male with a history of recently diagnosed lung cancer on chemotherapy and radiation, CHF, COPD, diabetes and hypertension who presents for evaluation of sore throat, chills, HA, productive cough, nausea x 3 days. Patient is extremely well appearing, no distress, is afebrile, his heart rate is 10 weight with a BP of 95/62 however review of Epic shows that this is patient's baseline blood pressure and HR. HEENT exam with no acute findings. Labs show a normal white count with normal ANC no evidence of neutropenia. Rapid strep is negative, sent it for culture. Will check for mono. We'll do chest x-ray to rule out pneumonia. There is no clinical evidence of epiglottitis, tracheitis, peritonsillar abscess, retropharyngeal abscess.  We'll discuss with patient's oncologist.   Clinical Course as of Nov 30 1433  Sun Nov 30, 2016  1410 Labs and imaging with no acute findings. Discussed with Dr. Janese Banks, patient's oncologist who will ensure follow up for patient tomorrow for further eval. No indication for admission.  Patient is on daily radiation and will see oncology tomorrow.   [CV]    Clinical Course  User Index [CV] Rudene Re, MD    Pertinent labs & imaging results that were available during my care of the patient were reviewed by me and considered in my medical decision making (see chart for details).    ____________________________________________   FINAL CLINICAL IMPRESSION(S) / ED DIAGNOSES  Final diagnoses:  Viral pharyngitis  Upper respiratory tract infection, unspecified type      NEW MEDICATIONS STARTED DURING THIS VISIT:  New Prescriptions   No medications on file     Note:  This document was prepared using Dragon voice recognition software and may include unintentional dictation errors.    Alfred Levins, [place], MD 11/30/16 1435

## 2016-11-30 NOTE — Discharge Instructions (Signed)
Return to the emergency room if you have a fever greater than 100.51F, shortness of breath, difficulty swallowing, chest pain, abdominal pain, or any new symptoms concerning to you. Otherwise follow-up with your oncologist tomorrow.

## 2016-12-01 ENCOUNTER — Telehealth: Payer: Self-pay | Admitting: *Deleted

## 2016-12-01 ENCOUNTER — Ambulatory Visit: Payer: Medicare Other

## 2016-12-01 ENCOUNTER — Ambulatory Visit
Admission: RE | Admit: 2016-12-01 | Discharge: 2016-12-01 | Disposition: A | Discharge status: Home / Self Care | Payer: Medicare Other | Source: Ambulatory Visit | Attending: Radiation Oncology | Admitting: Radiation Oncology

## 2016-12-01 DIAGNOSIS — C3401 Malignant neoplasm of right main bronchus: Secondary | ICD-10-CM | POA: Diagnosis not present

## 2016-12-01 LAB — POCT RAPID STREP A: Streptococcus, Group A Screen (Direct): NEGATIVE

## 2016-12-01 MED ORDER — OXYCODONE HCL 5 MG PO TABS: 5.0000 mg | ORAL_TABLET | ORAL | 0 refills | Status: DC | PRN

## 2016-12-01 NOTE — Telephone Encounter (Signed)
Per pt, sore throat is worse when swallowing and hurts more on the right side.

## 2016-12-01 NOTE — Telephone Encounter (Signed)
I think Thursday should be fine. Can you clarify if sore throat is pain during swallowing or upper respiratory symptoms? If pain then we can give him some pain meds. He can also try magic mouthwash

## 2016-12-01 NOTE — Telephone Encounter (Signed)
Informed pt that will follow up with Dr. Janese Banks at next scheduled appt on Thursday 5/24. Informed that can pick up prescription today at cancer center when comes for radiation. Will get on-call MD to print/sign prescription. Pt verbalized understanding.

## 2016-12-01 NOTE — Telephone Encounter (Signed)
We could do oxycodone 5 mg po Q4 prn 30 tab no refills

## 2016-12-01 NOTE — Telephone Encounter (Signed)
Pt called in to inform Dr. Janese Banks that went to ED yesterday for sore throat and headache. Headache has resolved but sore throat persists today. Pt denies fever. States that ED wanted pt to follow up with Dr. Janese Banks. Pt's next appt is scheduled for Thursday 5/24. Please advise if needs to be seen sooner.

## 2016-12-02 ENCOUNTER — Other Ambulatory Visit: Payer: Self-pay | Admitting: *Deleted

## 2016-12-02 ENCOUNTER — Ambulatory Visit
Admission: RE | Admit: 2016-12-02 | Discharge: 2016-12-02 | Disposition: A | Discharge status: Home / Self Care | Payer: Medicare Other | Source: Ambulatory Visit | Attending: Radiation Oncology | Admitting: Radiation Oncology

## 2016-12-02 DIAGNOSIS — C3401 Malignant neoplasm of right main bronchus: Secondary | ICD-10-CM | POA: Diagnosis not present

## 2016-12-02 MED ORDER — SUCRALFATE 1 G PO TABS: 1.0000 g | ORAL_TABLET | Freq: Three times a day (TID) | ORAL | 3 refills | Status: DC

## 2016-12-03 ENCOUNTER — Ambulatory Visit
Admission: RE | Admit: 2016-12-03 | Discharge: 2016-12-03 | Disposition: A | Discharge status: Home / Self Care | Payer: Medicare Other | Source: Ambulatory Visit | Attending: Radiation Oncology | Admitting: Radiation Oncology

## 2016-12-03 DIAGNOSIS — C3401 Malignant neoplasm of right main bronchus: Secondary | ICD-10-CM | POA: Diagnosis not present

## 2016-12-03 LAB — CULTURE, GROUP A STREP (THRC)

## 2016-12-04 ENCOUNTER — Inpatient Hospital Stay: Payer: Medicare Other

## 2016-12-04 ENCOUNTER — Encounter: Payer: Self-pay | Admitting: Oncology

## 2016-12-04 ENCOUNTER — Inpatient Hospital Stay (HOSPITAL_BASED_OUTPATIENT_CLINIC_OR_DEPARTMENT_OTHER): Payer: Medicare Other | Admitting: Oncology

## 2016-12-04 ENCOUNTER — Ambulatory Visit
Admission: RE | Admit: 2016-12-04 | Discharge: 2016-12-04 | Disposition: A | Discharge status: Home / Self Care | Payer: Medicare Other | Source: Ambulatory Visit | Attending: Radiation Oncology | Admitting: Radiation Oncology

## 2016-12-04 VITALS — BP 84/60 | HR 104 | Temp 99.3°F | Wt 202.9 lb

## 2016-12-04 VITALS — BP 110/69 | HR 100 | Resp 18

## 2016-12-04 DIAGNOSIS — Y842 Radiological procedure and radiotherapy as the cause of abnormal reaction of the patient, or of later complication, without mention of misadventure at the time of the procedure: Secondary | ICD-10-CM | POA: Diagnosis not present

## 2016-12-04 DIAGNOSIS — C3411 Malignant neoplasm of upper lobe, right bronchus or lung: Secondary | ICD-10-CM

## 2016-12-04 DIAGNOSIS — Z5111 Encounter for antineoplastic chemotherapy: Secondary | ICD-10-CM

## 2016-12-04 DIAGNOSIS — T402X5A Adverse effect of other opioids, initial encounter: Secondary | ICD-10-CM | POA: Diagnosis not present

## 2016-12-04 DIAGNOSIS — S63509A Unspecified sprain of unspecified wrist, initial encounter: Secondary | ICD-10-CM | POA: Insufficient documentation

## 2016-12-04 DIAGNOSIS — G893 Neoplasm related pain (acute) (chronic): Secondary | ICD-10-CM

## 2016-12-04 DIAGNOSIS — I9589 Other hypotension: Secondary | ICD-10-CM

## 2016-12-04 DIAGNOSIS — C3401 Malignant neoplasm of right main bronchus: Secondary | ICD-10-CM | POA: Diagnosis not present

## 2016-12-04 DIAGNOSIS — K5903 Drug induced constipation: Secondary | ICD-10-CM | POA: Diagnosis not present

## 2016-12-04 DIAGNOSIS — G5600 Carpal tunnel syndrome, unspecified upper limb: Secondary | ICD-10-CM | POA: Insufficient documentation

## 2016-12-04 LAB — COMPREHENSIVE METABOLIC PANEL
ALBUMIN: 3.2 g/dL — AB (ref 3.5–5.0)
ALK PHOS: 86 U/L (ref 38–126)
ALT: 18 U/L (ref 17–63)
ANION GAP: 4 — AB (ref 5–15)
AST: 16 U/L (ref 15–41)
BUN: 25 mg/dL — ABNORMAL HIGH (ref 6–20)
CO2: 22 mmol/L (ref 22–32)
Calcium: 9.5 mg/dL (ref 8.9–10.3)
Chloride: 108 mmol/L (ref 101–111)
Creatinine, Ser: 1.21 mg/dL (ref 0.61–1.24)
GFR calc non Af Amer: 57 mL/min — ABNORMAL LOW (ref >60)
GLUCOSE: 161 mg/dL — AB (ref 65–99)
POTASSIUM: 4.7 mmol/L (ref 3.5–5.1)
SODIUM: 134 mmol/L — AB (ref 135–145)
Total Bilirubin: 0.7 mg/dL (ref 0.3–1.2)
Total Protein: 7.6 g/dL (ref 6.5–8.1)

## 2016-12-04 LAB — CBC WITH DIFFERENTIAL/PLATELET
BASOS PCT: 0 %
Basophils Absolute: 0 10*3/uL (ref 0–0.1)
EOS ABS: 0 10*3/uL (ref 0–0.7)
EOS PCT: 1 %
HCT: 33.1 % — ABNORMAL LOW (ref 40.0–52.0)
HEMOGLOBIN: 11.5 g/dL — AB (ref 13.0–18.0)
Lymphocytes Relative: 9 %
Lymphs Abs: 0.4 10*3/uL — ABNORMAL LOW (ref 1.0–3.6)
MCH: 30.1 pg (ref 26.0–34.0)
MCHC: 34.8 g/dL (ref 32.0–36.0)
MCV: 86.6 fL (ref 80.0–100.0)
MONOS PCT: 16 %
Monocytes Absolute: 0.7 10*3/uL (ref 0.2–1.0)
NEUTROS PCT: 74 %
Neutro Abs: 3.1 10*3/uL (ref 1.4–6.5)
PLATELETS: 121 10*3/uL — AB (ref 150–440)
RBC: 3.82 MIL/uL — ABNORMAL LOW (ref 4.40–5.90)
RDW: 15.2 % — ABNORMAL HIGH (ref 11.5–14.5)
WBC: 4.2 10*3/uL (ref 3.8–10.6)

## 2016-12-04 MED ORDER — PALONOSETRON HCL INJECTION 0.25 MG/5ML
0.2500 mg | Freq: Once | INTRAVENOUS | Status: AC
  Administered 2016-12-04: 0.25 mg via INTRAVENOUS
  Filled 2016-12-04: qty 5

## 2016-12-04 MED ORDER — SODIUM CHLORIDE 0.9 % IV SOLN
20.0000 mg | Freq: Once | INTRAVENOUS | Status: AC
  Administered 2016-12-04: 20 mg via INTRAVENOUS
  Filled 2016-12-04: qty 2

## 2016-12-04 MED ORDER — FAMOTIDINE IN NACL 20-0.9 MG/50ML-% IV SOLN
20.0000 mg | Freq: Once | INTRAVENOUS | Status: AC
  Administered 2016-12-04: 20 mg via INTRAVENOUS
  Filled 2016-12-04: qty 50

## 2016-12-04 MED ORDER — HEPARIN SOD (PORK) LOCK FLUSH 100 UNIT/ML IV SOLN
500.0000 [IU] | Freq: Once | INTRAVENOUS | Status: AC
  Administered 2016-12-04: 500 [IU] via INTRAVENOUS
  Filled 2016-12-04: qty 5

## 2016-12-04 MED ORDER — SODIUM CHLORIDE 0.9 % IV SOLN
Freq: Once | INTRAVENOUS | Status: AC
  Administered 2016-12-04: 12:00:00 via INTRAVENOUS
  Filled 2016-12-04: qty 1000

## 2016-12-04 MED ORDER — SODIUM CHLORIDE 0.9% FLUSH
10.0000 mL | INTRAVENOUS | Status: DC | PRN
  Administered 2016-12-04: 10 mL via INTRAVENOUS
  Filled 2016-12-04: qty 10

## 2016-12-04 MED ORDER — MORPHINE SULFATE 2 MG/ML IJ SOLN
4.0000 mg | Freq: Once | INTRAMUSCULAR | Status: AC
  Administered 2016-12-04: 4 mg via INTRAVENOUS
  Filled 2016-12-04: qty 2

## 2016-12-04 MED ORDER — SODIUM CHLORIDE 0.9 % IV SOLN
180.0000 mg | Freq: Once | INTRAVENOUS | Status: AC
  Administered 2016-12-04: 180 mg via INTRAVENOUS
  Filled 2016-12-04: qty 18

## 2016-12-04 MED ORDER — SODIUM CHLORIDE 0.9 % IV SOLN
INTRAVENOUS | Status: DC
  Administered 2016-12-04: 11:00:00 via INTRAVENOUS
  Filled 2016-12-04 (×2): qty 1000

## 2016-12-04 MED ORDER — DIPHENHYDRAMINE HCL 50 MG/ML IJ SOLN
50.0000 mg | Freq: Once | INTRAMUSCULAR | Status: AC
  Administered 2016-12-04: 50 mg via INTRAVENOUS
  Filled 2016-12-04: qty 1

## 2016-12-04 MED ORDER — PACLITAXEL CHEMO INJECTION 300 MG/50ML
45.0000 mg/m2 | Freq: Once | INTRAVENOUS | Status: AC
  Administered 2016-12-04: 96 mg via INTRAVENOUS
  Filled 2016-12-04: qty 16

## 2016-12-04 NOTE — Progress Notes (Signed)
Patient receiving treatment today per Dr. Janese Banks, addiing fluids, 500 ml over an hour.

## 2016-12-04 NOTE — Progress Notes (Signed)
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 Patient is a 76 year old African-American male with a past medical history significant for COPD for which she sees Dr. Humphrey Rolls. He was recently admitted to the hospital in March 2018 with symptoms of pneumonia. CT thorax at that time showed: Perihilar lung mass with associated postobstructive atelectasis and consolidation of the right upper lobe  2. This was followed by a PET CT scan on 10/10/2016 which showed:1. Hypermetabolic RIGHT hilar mass with postobstructive collapse of the RIGHT upper lobe consistent with primary or metastatic bronchogenic carcinoma. 2. Hypermetabolic RIGHT paratracheal, prevascular and RIGHT supraclavicular nodal metastasis. 3. RIGHT supraclavicular node is intensely metabolic but likely too small for biopsy. 4. RIGHT lower lobe peripheral nodular thickening with associated metabolic activity is likely inflammatory. 5. Severe bullous change in the LEFT hemithorax  3. CT abdomen from 10/10/2016 showed no evidence of metastatic disease.  4. 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 Patient has problems with bilateral lower extremity swelling as well as shortness of breath on minimal exertion. He uses nighttime oxygen. He follows up with Dr. Floyce Stakes pulmonary as well as cardiology  6. Concurrent chemo/RT started on 11/13/16  Interval history- patient was in the ER recently on 11/30/2016 for symptoms  of cough nausea and sore throat. He was discharged to same day from the ER. 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 Oxycodone is controlling his pain well. Leg swelling better. Mild fatigue  ECOG PS- 1 Pain scale- 0 Opioid associated constipation- yes  Review of systems- Review of Systems  Constitutional: Positive for malaise/fatigue. Negative for chills, fever and weight loss.  HENT: Positive for sore throat. Negative for congestion, ear discharge and nosebleeds.   Eyes: Negative for blurred vision.  Respiratory: Negative for cough, hemoptysis, sputum production, shortness of breath and wheezing.   Cardiovascular: Negative for chest pain, palpitations, orthopnea and claudication.  Gastrointestinal: Positive for constipation. Negative for abdominal pain, blood in stool, diarrhea, heartburn, melena, nausea and vomiting.  Genitourinary: Negative for dysuria, flank pain, frequency, hematuria and urgency.  Musculoskeletal: Negative for back pain, joint pain and myalgias.  Skin: Negative for rash.  Neurological: Negative for dizziness, tingling, focal weakness, seizures, weakness and headaches.  Endo/Heme/Allergies: Does not bruise/bleed easily.  Psychiatric/Behavioral: Negative for depression and suicidal ideas. The patient does not have insomnia.        No Known Allergies   Past Medical History:  Diagnosis Date  . Cancer ([place])    Lung CA  . CHF (congestive heart failure) ([place])   . COPD (chronic obstructive pulmonary disease) ([place])   . Coronary artery disease   . Diabetes mellitus without complication ([place])   . Fibromyalgia   . GERD (gastroesophageal reflux disease)   . Hypertension      Past Surgical History:  Procedure Laterality Date  . COLONOSCOPY WITH PROPOFOL N/A 01/18/2015   Procedure: COLONOSCOPY WITH PROPOFOL;  Surgeon: Manya Silvas, MD;  Location: [facility] ENDOSCOPY;  Service: Endoscopy;  Laterality: N/A;  .  ESOPHAGOGASTRODUODENOSCOPY N/A 01/18/2015   Procedure: ESOPHAGOGASTRODUODENOSCOPY (EGD);  Surgeon: Manya Silvas, MD;  Location: [facility] ENDOSCOPY;  Service: Endoscopy;  Laterality: N/A;  . EYE SURGERY    . FLEXIBLE BRONCHOSCOPY N/A 10/22/2016   Procedure: FLEXIBLE BRONCHOSCOPY;  Surgeon: Allyne Gee, MD;  Location: [facility] ORS;  Service: Pulmonary;  Laterality: N/A;  . HERNIA REPAIR    . IR FLUORO GUIDE PORT INSERTION LEFT  11/07/2016  . REPAIR KNEE LIGAMENT    . TONSILLECTOMY      Social History   Social History  . Marital status: Married    Spouse name: N/A  . Number of children: N/A  . Years of education: N/A   Occupational History  . Not on file.   Social History Main Topics  . Smoking status: Former Smoker    Quit date: 12/01/2003  . Smokeless tobacco: Never Used  . Alcohol use 1.2 oz/week    2 Shots of liquor per week  . Drug use: No  . Sexual activity: Not on file   Other Topics Concern  . Not on file   Social History Narrative  . No narrative on file    Family History  Problem Relation Age of Onset  . Prostate cancer Father   . Prostate cancer Brother   . Diabetes Unknown        all 13 siblings  . Hypertension Unknown        all 13 siblings     Current Outpatient Prescriptions:  .  acetaminophen (TYLENOL) 325 MG tablet, Take 2 tablets (650 mg total) by mouth every 6 (six) hours as needed for mild pain (or Fever >/= 101)., Disp: , Rfl:  .  albuterol-ipratropium (COMBIVENT) 18-103 MCG/ACT inhaler, Inhale 2 puffs into the lungs every 6 (six) hours as needed for wheezing or shortness of breath., Disp: , Rfl:  .  aspirin 325 MG tablet, Take 325 mg by mouth daily., Disp: , Rfl:  .  benzonatate (TESSALON) 200 MG capsule, Take 1 capsule (200 mg total) by mouth 3 (three) times daily as needed for cough., Disp: 20 capsule, Rfl: 0 .  carvedilol (COREG) 25 MG tablet, Take 25 mg by mouth 2 (two) times daily with a meal., Disp: , Rfl:  .  dexamethasone (DECADRON) 4 MG  tablet, Take 2 tablets (8 mg total) by mouth daily. Start the day after chemotherapy for 2 days., Disp: 30 tablet, Rfl: 1 .  Fluticasone Furoate-Vilanterol 100-25 MCG/INH AEPB, Inhale 1 puff into the lungs daily. , Disp: , Rfl:  .  furosemide (LASIX) 40 MG tablet, Take 40 mg by mouth daily., Disp: , Rfl:  .  lidocaine-prilocaine (EMLA) cream, Apply to affected area once, Disp: 30 g, Rfl: 3 .  LORazepam (ATIVAN) 0.5 MG tablet, Take 1 tablet (0.5 mg total) by mouth every 6 (six) hours as needed (Nausea or vomiting)., Disp: 30 tablet, Rfl: 0 .  losartan (COZAAR) 100 MG tablet, Take 100 mg by mouth daily., Disp: , Rfl:  .  metFORMIN (GLUCOPHAGE) 500 MG tablet, Take 500 mg by mouth 2 (two) times daily. , Disp: , Rfl:  .  omeprazole (PRILOSEC) 40 MG capsule, Take 40 mg by mouth daily., Disp: , Rfl:  .  ondansetron (ZOFRAN) 8 MG tablet, Take 1 tablet (8 mg total) by mouth 2 (two) times daily as needed for refractory nausea / vomiting. Start on day 3 after chemo., Disp: 30 tablet, Rfl: 1 .  oxyCODONE (OXY IR/ROXICODONE) 5 MG immediate  release tablet, Take 1 tablet (5 mg total) by mouth every 4 (four) hours as needed for severe pain., Disp: 30 tablet, Rfl: 0 .  prochlorperazine (COMPAZINE) 10 MG tablet, Take 1 tablet (10 mg total) by mouth every 6 (six) hours as needed (Nausea or vomiting)., Disp: 30 tablet, Rfl: 1 .  simvastatin (ZOCOR) 10 MG tablet, Take 10 mg by mouth every evening. , Disp: , Rfl:  .  sitaGLIPtin (JANUVIA) 50 MG tablet, Take 50 mg by mouth daily., Disp: , Rfl:  .  sucralfate (CARAFATE) 1 g tablet, Take 1 tablet (1 g total) by mouth 3 (three) times daily. Dissolve in 2-3 tbsp warm water, swish and swallow., Disp: 90 tablet, Rfl: 3  Physical exam:  Vitals:   12/04/16 0933  BP: (!) 84/60  Pulse: (!) 104  Temp: 99.3 F (37.4 C)  TempSrc: Tympanic  SpO2: 94%  Weight: 202 lb 14.4 oz (92 kg)   Physical Exam  Constitutional: He is oriented to person, place, and time and well-developed,  well-nourished, and in no distress.  HENT:  Head: Normocephalic and atraumatic.  No evidence of pharyngitis or mucositis  Eyes: EOM are normal. Pupils are equal, round, and reactive to light.  Neck: Normal range of motion.  Cardiovascular: Normal rate, regular rhythm and normal heart sounds.   Pulmonary/Chest: Effort normal and breath sounds normal.  Abdominal: Soft. Bowel sounds are normal.  Musculoskeletal: He exhibits edema (trace + 1- improved).  Neurological: He is alert and oriented to person, place, and time.  Skin: Skin is warm and dry.     CMP Latest Ref Rng & Units 11/30/2016  Glucose 65 - 99 mg/dL 229(H)  BUN 6 - 20 mg/dL 22(H)  Creatinine 0.61 - 1.24 mg/dL 1.15  Sodium 135 - 145 mmol/L 137  Potassium 3.5 - 5.1 mmol/L 4.5  Chloride 101 - 111 mmol/L 110  CO2 22 - 32 mmol/L 21(L)  Calcium 8.9 - 10.3 mg/dL 9.4  Total Protein 6.5 - 8.1 g/dL 7.6  Total Bilirubin 0.3 - 1.2 mg/dL 0.9  Alkaline Phos 38 - 126 U/L 97  AST 15 - 41 U/L 17  ALT 17 - 63 U/L 20   CBC Latest Ref Rng & Units 11/30/2016  WBC 3.8 - 10.6 K/uL 4.0  Hemoglobin 13.0 - 18.0 g/dL 12.2(L)  Hematocrit 40.0 - 52.0 % 36.6(L)  Platelets 150 - 440 K/uL 130(L)    No images are attached to the encounter.  Dg Neck Soft Tissue  Result Date: 11/30/2016 CLINICAL DATA:  Swelling in side of throat. Cancer patient undergoing chemotherapy. EXAM: NECK SOFT TISSUES - 1+ VIEW COMPARISON:  None. FINDINGS: The prevertebral soft tissues are normal in appearance. The epiglottis, valleculae, and piriform sinuses are normal in appearance as well. No abnormalities in the soft tissues of the upper neck are identified. Severe degenerative changes seen in the cervical spine. A left-sided Port-A-Cath is identified. Bullous emphysematous changes are seen in the upper left lung apex. IMPRESSION: No acute abnormalities identified. Electronically Signed   By: Dorise Bullion III M.D   On: 11/30/2016 13:50   Dg Chest 2 View  Result Date:  11/30/2016 CLINICAL DATA:  Sore throat and headache since Thursday. Currently receiving Chemo and radiation for lung cancer. Former smoker. Hx of COPD, CHF,diabetes and HTN EXAM: CHEST  2 VIEW COMPARISON:  PET-CT, 10/10/2016 FINDINGS: Cardiac silhouette is normal in size. Mild thickening along the right peritracheal stripe consistent with known adenopathy. There is hazy opacity extending superiorly from the right  hilum consistent with the right posterior upper lobe consolidation/ collapse noted on the prior PET-CT. This appears less extensive, being somewhat less dense than on the chest radiograph from 09/25/2016. There is somewhat less volume loss on the right as well. Large areas of emphysema in the left lung lead to compression of the bronchovascular structures medially and inferiorly, stable. There is no evidence of pneumonia. No pulmonary edema. No pleural effusion and no evidence of a pneumothorax. Left anterior chest wall Port-A-Cath is stable. IMPRESSION: 1. No acute cardiopulmonary disease. 2. There has been some improvement with the right upper lobe consolidation/postobstructive change decreased in density from the prior studies, along with less volume loss noted on the right. No other change. Electronically Signed   By: Lajean Manes M.D.   On: 11/30/2016 10:37   Ir Fluoro Guide Port Insertion Left  Result Date: 11/07/2016 CLINICAL DATA:  Right lung carcinoma, planned chemotherapy and radiation therapy. Durable venous access is requested. EXAM: TUNNELED PORT CATHETER PLACEMENT WITH ULTRASOUND AND FLUOROSCOPIC GUIDANCE FLUOROSCOPY TIME:  0.2 minutes, 5 mGy ANESTHESIA/SEDATION: Intravenous Fentanyl and Versed were administered as conscious sedation during continuous monitoring of the patient's level of consciousness and physiological / cardiorespiratory status by the radiology RN, with a total moderate sedation time of 22 minutes. TECHNIQUE: The procedure, risks, benefits, and alternatives were  explained to the patient. Questions regarding the procedure were encouraged and answered. The patient understands and consents to the procedure. As antibiotic prophylaxis, cefazolin 2 g was ordered pre-procedure and administered intravenously within one hour of incision. A left-sided approach was selected because of right-sided lesion and plans for regional radiation therapy. Patency of the left IJ vein was confirmed with ultrasound with image documentation. An appropriate skin site was determined. Skin site was marked. Region was prepped using maximum barrier technique including cap and mask, sterile gown, sterile gloves, large sterile sheet, and Chlorhexidine as cutaneous antisepsis. The region was infiltrated locally with 1% lidocaine. Under real-time ultrasound guidance, the left IJ vein was accessed with a 21 gauge micropuncture needle; the needle tip within the vein was confirmed with ultrasound image documentation. Needle was exchanged over a 018 guidewire for transitional dilator which allowed passage of the [facility] wire into the IVC. Over this, the transitional dilator was exchanged for a 5 [place] MPA catheter. A small incision was made on the left anterior chest wall and a subcutaneous pocket fashioned. The power-injectable port was positioned and its catheter tunneled to the left IJ dermatotomy site. The MPA catheter was exchanged over an Amplatz wire for a peel-away sheath, through which the port catheter, which had been trimmed to the appropriate length, was advanced and positioned under fluoroscopy with its tip at the cavoatrial junction. Spot chest radiograph confirms good catheter position and no pneumothorax. Paucity of lung markings, stable since prior chest radiograph 09/25/2016. The pocket was closed with deep interrupted and subcuticular continuous 3-0 Monocryl sutures. The port was flushed per protocol. The incisions were covered with Dermabond then covered with a sterile dressing.  COMPLICATIONS: COMPLICATIONS None immediate IMPRESSION: Technically successful left IJ power-injectable port catheter placement. Ready for routine use. Electronically Signed   By: Lucrezia Europe M.D.   On: 11/07/2016 15:13     Assessment and plan- Patient is a 76 y.o. male SCC of RUL Stage IIIB T1N3M0 currently undergoing concurrent chemo/RT  Counts ok to proceed with cycle 4 of carbo/taxol today. He will proceed with chemo next week for cycle 5. He completes his RT on 12/16/16. We will  get repeat CT chest abdomen pelvis 3rd week of June and see him back a few days later. 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 I have asked him to increase oxycodone dose to 10 mg Q4 prn. Docusate and senna for opioid induced constipation  Visit Diagnosis 1. Malignant neoplasm of upper lobe of right lung ([place])   2. Encounter for antineoplastic chemotherapy      Dr. Randa Evens, MD, MPH [facility] at [facility] Pager- [phone] 12/04/2016 11:33 AM

## 2016-12-04 NOTE — Addendum Note (Signed)
Addended by: Luella Cook on: 12/04/2016 12:09 PM   Modules accepted: Orders

## 2016-12-04 NOTE — Progress Notes (Signed)
Patient denies pain or discomfort at this time, manual BP Left arm 96/62.  Patient ambulates without assistance, brought to exam room 6 accompanied by his wife.

## 2016-12-05 ENCOUNTER — Ambulatory Visit
Admission: RE | Admit: 2016-12-05 | Discharge: 2016-12-05 | Disposition: A | Discharge status: Home / Self Care | Payer: Medicare Other | Source: Ambulatory Visit | Attending: Radiation Oncology | Admitting: Radiation Oncology

## 2016-12-05 DIAGNOSIS — C3401 Malignant neoplasm of right main bronchus: Secondary | ICD-10-CM | POA: Diagnosis not present

## 2016-12-09 ENCOUNTER — Ambulatory Visit
Admission: RE | Admit: 2016-12-09 | Discharge: 2016-12-09 | Disposition: A | Discharge status: Home / Self Care | Payer: Medicare Other | Source: Ambulatory Visit | Attending: Radiation Oncology | Admitting: Radiation Oncology

## 2016-12-09 DIAGNOSIS — C3401 Malignant neoplasm of right main bronchus: Secondary | ICD-10-CM | POA: Diagnosis not present

## 2016-12-10 ENCOUNTER — Telehealth: Payer: Self-pay | Admitting: *Deleted

## 2016-12-10 ENCOUNTER — Ambulatory Visit
Admission: RE | Admit: 2016-12-10 | Discharge: 2016-12-10 | Disposition: A | Discharge status: Home / Self Care | Payer: Medicare Other | Source: Ambulatory Visit | Attending: Radiation Oncology | Admitting: Radiation Oncology

## 2016-12-10 DIAGNOSIS — C3401 Malignant neoplasm of right main bronchus: Secondary | ICD-10-CM | POA: Diagnosis not present

## 2016-12-10 NOTE — Telephone Encounter (Signed)
Called home and spoke to wife. Told her that the pt has appt 6/7 for chemo but due to him finishing radiation 6/5 he does not need chemo treatment that week.  The chemo is only when pt gets radiation for 4 days of the week and he will only get radiation 2 days next week.  Therefore the last chemo will be this week.  Also we have made him a scan appt for mid June and then see md after scan. I printed a new schedule and will leave it with the radiation volunteer and the wife will pick it up when they come today. I took the envelope down to radiation and told volunteer

## 2016-12-11 ENCOUNTER — Inpatient Hospital Stay: Payer: Medicare Other

## 2016-12-11 ENCOUNTER — Ambulatory Visit
Admission: RE | Admit: 2016-12-11 | Discharge: 2016-12-11 | Disposition: A | Discharge status: Home / Self Care | Payer: Medicare Other | Source: Ambulatory Visit | Attending: Radiation Oncology | Admitting: Radiation Oncology

## 2016-12-11 VITALS — BP 109/73 | HR 100 | Temp 98.5°F | Resp 18

## 2016-12-11 DIAGNOSIS — C3401 Malignant neoplasm of right main bronchus: Secondary | ICD-10-CM | POA: Diagnosis not present

## 2016-12-11 DIAGNOSIS — Z5111 Encounter for antineoplastic chemotherapy: Secondary | ICD-10-CM | POA: Diagnosis not present

## 2016-12-11 DIAGNOSIS — C3411 Malignant neoplasm of upper lobe, right bronchus or lung: Secondary | ICD-10-CM

## 2016-12-11 LAB — COMPREHENSIVE METABOLIC PANEL
ALBUMIN: 3.1 g/dL — AB (ref 3.5–5.0)
ALK PHOS: 91 U/L (ref 38–126)
ALT: 24 U/L (ref 17–63)
ANION GAP: 5 (ref 5–15)
AST: 20 U/L (ref 15–41)
BILIRUBIN TOTAL: 0.6 mg/dL (ref 0.3–1.2)
BUN: 24 mg/dL — AB (ref 6–20)
CALCIUM: 9.5 mg/dL (ref 8.9–10.3)
CO2: 21 mmol/L — AB (ref 22–32)
Chloride: 111 mmol/L (ref 101–111)
Creatinine, Ser: 1.33 mg/dL — ABNORMAL HIGH (ref 0.61–1.24)
GFR calc Af Amer: 59 mL/min — ABNORMAL LOW (ref >60)
GFR calc non Af Amer: 51 mL/min — ABNORMAL LOW (ref >60)
GLUCOSE: 178 mg/dL — AB (ref 65–99)
Potassium: 4.8 mmol/L (ref 3.5–5.1)
SODIUM: 137 mmol/L (ref 135–145)
TOTAL PROTEIN: 7.6 g/dL (ref 6.5–8.1)

## 2016-12-11 LAB — CBC WITH DIFFERENTIAL/PLATELET
BASOS PCT: 1 %
Basophils Absolute: 0 10*3/uL (ref 0–0.1)
EOS ABS: 0.1 10*3/uL (ref 0–0.7)
Eosinophils Relative: 3 %
HEMATOCRIT: 34.3 % — AB (ref 40.0–52.0)
Hemoglobin: 11.5 g/dL — ABNORMAL LOW (ref 13.0–18.0)
LYMPHS ABS: 0.4 10*3/uL — AB (ref 1.0–3.6)
Lymphocytes Relative: 15 %
MCH: 29 pg (ref 26.0–34.0)
MCHC: 33.4 g/dL (ref 32.0–36.0)
MCV: 86.9 fL (ref 80.0–100.0)
MONOS PCT: 14 %
Monocytes Absolute: 0.4 10*3/uL (ref 0.2–1.0)
NEUTROS ABS: 1.7 10*3/uL (ref 1.4–6.5)
NEUTROS PCT: 67 %
Platelets: 102 10*3/uL — ABNORMAL LOW (ref 150–440)
RBC: 3.95 MIL/uL — AB (ref 4.40–5.90)
RDW: 15.5 % — ABNORMAL HIGH (ref 11.5–14.5)
WBC: 2.6 10*3/uL — AB (ref 3.8–10.6)

## 2016-12-11 MED ORDER — FAMOTIDINE IN NACL 20-0.9 MG/50ML-% IV SOLN
20.0000 mg | Freq: Once | INTRAVENOUS | Status: AC
  Administered 2016-12-11: 20 mg via INTRAVENOUS
  Filled 2016-12-11: qty 50

## 2016-12-11 MED ORDER — SODIUM CHLORIDE 0.9 % IV SOLN
20.0000 mg | Freq: Once | INTRAVENOUS | Status: AC
  Administered 2016-12-11: 20 mg via INTRAVENOUS
  Filled 2016-12-11: qty 2

## 2016-12-11 MED ORDER — PALONOSETRON HCL INJECTION 0.25 MG/5ML
0.2500 mg | Freq: Once | INTRAVENOUS | Status: AC
  Administered 2016-12-11: 0.25 mg via INTRAVENOUS
  Filled 2016-12-11: qty 5

## 2016-12-11 MED ORDER — SODIUM CHLORIDE 0.9 % IV SOLN
182.0000 mg | Freq: Once | INTRAVENOUS | Status: AC
  Administered 2016-12-11: 180 mg via INTRAVENOUS
  Filled 2016-12-11: qty 18

## 2016-12-11 MED ORDER — HEPARIN SOD (PORK) LOCK FLUSH 100 UNIT/ML IV SOLN
500.0000 [IU] | Freq: Once | INTRAVENOUS | Status: AC
  Administered 2016-12-11: 500 [IU] via INTRAVENOUS

## 2016-12-11 MED ORDER — SODIUM CHLORIDE 0.9 % IV SOLN
Freq: Once | INTRAVENOUS | Status: AC
Start: 2016-12-11 — End: 2016-12-11
  Administered 2016-12-11: 12:00:00 via INTRAVENOUS
  Filled 2016-12-11: qty 1000

## 2016-12-11 MED ORDER — DIPHENHYDRAMINE HCL 50 MG/ML IJ SOLN
50.0000 mg | Freq: Once | INTRAMUSCULAR | Status: AC
  Administered 2016-12-11: 50 mg via INTRAVENOUS
  Filled 2016-12-11: qty 1

## 2016-12-11 MED ORDER — PACLITAXEL CHEMO INJECTION 300 MG/50ML
45.0000 mg/m2 | Freq: Once | INTRAVENOUS | Status: AC
  Administered 2016-12-11: 96 mg via INTRAVENOUS
  Filled 2016-12-11: qty 16

## 2016-12-11 MED ORDER — SODIUM CHLORIDE 0.9% FLUSH
10.0000 mL | INTRAVENOUS | Status: DC | PRN
Start: 2016-12-11 — End: 2016-12-11
  Administered 2016-12-11: 10 mL via INTRAVENOUS
  Filled 2016-12-11: qty 10

## 2016-12-12 ENCOUNTER — Ambulatory Visit
Admission: RE | Admit: 2016-12-12 | Discharge: 2016-12-12 | Disposition: A | Discharge status: Home / Self Care | Payer: Medicare Other | Source: Ambulatory Visit | Attending: Radiation Oncology | Admitting: Radiation Oncology

## 2016-12-12 DIAGNOSIS — C3401 Malignant neoplasm of right main bronchus: Secondary | ICD-10-CM | POA: Diagnosis not present

## 2016-12-15 ENCOUNTER — Telehealth: Payer: Self-pay | Admitting: *Deleted

## 2016-12-15 ENCOUNTER — Ambulatory Visit
Admission: RE | Admit: 2016-12-15 | Discharge: 2016-12-15 | Disposition: A | Discharge status: Home / Self Care | Payer: Medicare Other | Source: Ambulatory Visit | Attending: Radiation Oncology | Admitting: Radiation Oncology

## 2016-12-15 DIAGNOSIS — C3401 Malignant neoplasm of right main bronchus: Secondary | ICD-10-CM | POA: Diagnosis not present

## 2016-12-15 NOTE — Telephone Encounter (Signed)
Pt called to report is weak and dizzy. Wife checked BP today and it was 88/59. Pt already spoke to PCP and was instructed to take 1/2 dose of BP meds which he took this morning. Pt will follow up with PCP on Friday but wants to know if can get IVF tomorrow when comes in for radiation treatment. Please advise.

## 2016-12-15 NOTE — Telephone Encounter (Signed)
Per Woodfin Ganja, will give 1 hour normal saline tomorrow. Pt instructed to hold BP meds until follow up visit with PCP on Friday. Instructed pt's wife of MD recommendations and appt for 6/5 at [place] for IVF given to pt's wife Stanton Kidney. Mary verbalized understanding of all instructions.

## 2016-12-16 ENCOUNTER — Inpatient Hospital Stay: Payer: Medicare Other | Attending: Oncology

## 2016-12-16 ENCOUNTER — Other Ambulatory Visit: Payer: Self-pay | Admitting: *Deleted

## 2016-12-16 ENCOUNTER — Ambulatory Visit
Admission: RE | Admit: 2016-12-16 | Discharge: 2016-12-16 | Disposition: A | Discharge status: Home / Self Care | Payer: Medicare Other | Source: Ambulatory Visit | Attending: Radiation Oncology | Admitting: Radiation Oncology

## 2016-12-16 DIAGNOSIS — K219 Gastro-esophageal reflux disease without esophagitis: Secondary | ICD-10-CM | POA: Insufficient documentation

## 2016-12-16 DIAGNOSIS — I509 Heart failure, unspecified: Secondary | ICD-10-CM | POA: Insufficient documentation

## 2016-12-16 DIAGNOSIS — Z87891 Personal history of nicotine dependence: Secondary | ICD-10-CM | POA: Insufficient documentation

## 2016-12-16 DIAGNOSIS — C3411 Malignant neoplasm of upper lobe, right bronchus or lung: Secondary | ICD-10-CM | POA: Diagnosis present

## 2016-12-16 DIAGNOSIS — C770 Secondary and unspecified malignant neoplasm of lymph nodes of head, face and neck: Secondary | ICD-10-CM | POA: Diagnosis not present

## 2016-12-16 DIAGNOSIS — I2510 Atherosclerotic heart disease of native coronary artery without angina pectoris: Secondary | ICD-10-CM | POA: Insufficient documentation

## 2016-12-16 DIAGNOSIS — I700 Atherosclerosis of aorta: Secondary | ICD-10-CM | POA: Insufficient documentation

## 2016-12-16 DIAGNOSIS — E119 Type 2 diabetes mellitus without complications: Secondary | ICD-10-CM | POA: Diagnosis not present

## 2016-12-16 DIAGNOSIS — Z7982 Long term (current) use of aspirin: Secondary | ICD-10-CM | POA: Insufficient documentation

## 2016-12-16 DIAGNOSIS — K208 Other esophagitis: Secondary | ICD-10-CM | POA: Insufficient documentation

## 2016-12-16 DIAGNOSIS — I110 Hypertensive heart disease with heart failure: Secondary | ICD-10-CM | POA: Insufficient documentation

## 2016-12-16 DIAGNOSIS — Z79899 Other long term (current) drug therapy: Secondary | ICD-10-CM | POA: Insufficient documentation

## 2016-12-16 DIAGNOSIS — Z7984 Long term (current) use of oral hypoglycemic drugs: Secondary | ICD-10-CM | POA: Diagnosis not present

## 2016-12-16 DIAGNOSIS — J449 Chronic obstructive pulmonary disease, unspecified: Secondary | ICD-10-CM | POA: Insufficient documentation

## 2016-12-16 DIAGNOSIS — M797 Fibromyalgia: Secondary | ICD-10-CM | POA: Diagnosis not present

## 2016-12-16 DIAGNOSIS — C3401 Malignant neoplasm of right main bronchus: Secondary | ICD-10-CM | POA: Diagnosis not present

## 2016-12-16 MED ORDER — HEPARIN SOD (PORK) LOCK FLUSH 100 UNIT/ML IV SOLN
500.0000 [IU] | Freq: Once | INTRAVENOUS | Status: AC
  Administered 2016-12-16: 500 [IU] via INTRAVENOUS
  Filled 2016-12-16: qty 5

## 2016-12-16 MED ORDER — SODIUM CHLORIDE 0.9% FLUSH
10.0000 mL | INTRAVENOUS | Status: DC | PRN
  Administered 2016-12-16: 10 mL via INTRAVENOUS
  Filled 2016-12-16: qty 10

## 2016-12-16 MED ORDER — SODIUM CHLORIDE 0.9 % IV SOLN
Freq: Once | INTRAVENOUS | Status: AC
  Administered 2016-12-16: 09:00:00 via INTRAVENOUS
  Filled 2016-12-16: qty 1000

## 2016-12-16 MED ORDER — HEPARIN SOD (PORK) LOCK FLUSH 100 UNIT/ML IV SOLN
INTRAVENOUS | Status: AC
  Filled 2016-12-16: qty 5

## 2016-12-18 ENCOUNTER — Ambulatory Visit: Payer: Medicare Other | Admitting: Oncology

## 2016-12-18 ENCOUNTER — Other Ambulatory Visit: Payer: Medicare Other

## 2016-12-24 ENCOUNTER — Ambulatory Visit
Admission: RE | Admit: 2016-12-24 | Discharge: 2016-12-24 | Disposition: A | Discharge status: Home / Self Care | Payer: Medicare Other | Source: Ambulatory Visit | Attending: Radiation Oncology | Admitting: Radiation Oncology

## 2016-12-24 ENCOUNTER — Encounter: Payer: Self-pay | Admitting: Radiation Oncology

## 2016-12-24 VITALS — BP 126/74 | HR 133 | Temp 99.1°F | Resp 22 | Wt 196.1 lb

## 2016-12-24 DIAGNOSIS — C3401 Malignant neoplasm of right main bronchus: Secondary | ICD-10-CM | POA: Diagnosis not present

## 2016-12-24 DIAGNOSIS — C3491 Malignant neoplasm of unspecified part of right bronchus or lung: Secondary | ICD-10-CM

## 2016-12-24 DIAGNOSIS — Z923 Personal history of irradiation: Secondary | ICD-10-CM | POA: Diagnosis not present

## 2016-12-24 DIAGNOSIS — Z87891 Personal history of nicotine dependence: Secondary | ICD-10-CM | POA: Diagnosis not present

## 2016-12-24 DIAGNOSIS — R1310 Dysphagia, unspecified: Secondary | ICD-10-CM | POA: Diagnosis not present

## 2016-12-24 DIAGNOSIS — Z9221 Personal history of antineoplastic chemotherapy: Secondary | ICD-10-CM | POA: Diagnosis not present

## 2016-12-24 DIAGNOSIS — R05 Cough: Secondary | ICD-10-CM | POA: Diagnosis not present

## 2016-12-24 DIAGNOSIS — Z510 Encounter for antineoplastic radiation therapy: Secondary | ICD-10-CM | POA: Insufficient documentation

## 2016-12-24 NOTE — Progress Notes (Signed)
Radiation Oncology Follow up Note  Name: Chris Wilkinson   Date:   12/24/2016 MRN:  [id number] DOB: 1941-02-21    This 76 y.o. male presents to the clinic today for reevaluation after 1 week break status post initial course of radiation therapy for. Staged IIIB squamous cell carcinoma the right lung  REFERRING PROVIDER: McLean-Scocuzza, Olivia Mackie *  HPI: Patient is a 76 year old male who has completed approximate 4000 cGy over 4 weeks of concurrent chemoradiation for a right lung squamous cell carcinoma stage IIIB (T3 N3 M0). He is doing fairly well he states he is feeling improved over the last couple weeks. He still having mild dysphagia. He also has a cough no hemoptysis..  COMPLICATIONS OF TREATMENT: none  FOLLOW UP COMPLIANCE: keeps appointments   PHYSICAL EXAM:  BP 126/74   Pulse (!) 133   Temp 99.1 F (37.3 C)   Resp (!) 22   Wt 196 lb 1.6 oz (89 kg)   SpO2 97%   BMI 27.35 kg/m  Well-developed well-nourished patient in NAD. HEENT reveals PERLA, EOMI, discs not visualized.  Oral cavity is clear. No oral mucosal lesions are identified. Neck is clear without evidence of cervical or supraclavicular adenopathy. Lungs are clear to A&P. Cardiac examination is essentially unremarkable with regular rate and rhythm without murmur rub or thrill. Abdomen is benign with no organomegaly or masses noted. Motor sensory and DTR levels are equal and symmetric in the upper and lower extremities. Cranial nerves II through XII are grossly intact. Proprioception is intact. No peripheral adenopathy or edema is identified. No motor or sensory levels are noted. Crude visual fields are within normal range.  RADIOLOGY RESULTS: Repeat CT scans part of treatment plan will be performed next week  PLAN: At the present time I like to scan the patient and evaluate for response. She we see excellent response will go ahead with a small field boost with concurrent chemotherapy. I'm also starting on Carafate rinses 3  times a day to swish and swallow. Also asked him to use Mucinex or other expectorant to help with his cough. I personally 7 ordered CT simulation.  I would like to take this opportunity to thank you for allowing me to participate in the care of your patient.Armstead Peaks., MD

## 2016-12-29 ENCOUNTER — Ambulatory Visit: Admission: RE | Admit: 2016-12-29 | Payer: Medicare Other | Source: Ambulatory Visit

## 2016-12-31 ENCOUNTER — Ambulatory Visit
Admission: RE | Admit: 2016-12-31 | Discharge: 2016-12-31 | Disposition: A | Discharge status: Home / Self Care | Payer: Medicare Other | Source: Ambulatory Visit | Attending: Oncology | Admitting: Oncology

## 2016-12-31 DIAGNOSIS — I2584 Coronary atherosclerosis due to calcified coronary lesion: Secondary | ICD-10-CM | POA: Insufficient documentation

## 2016-12-31 DIAGNOSIS — J439 Emphysema, unspecified: Secondary | ICD-10-CM | POA: Insufficient documentation

## 2016-12-31 DIAGNOSIS — I700 Atherosclerosis of aorta: Secondary | ICD-10-CM | POA: Insufficient documentation

## 2016-12-31 DIAGNOSIS — C3411 Malignant neoplasm of upper lobe, right bronchus or lung: Secondary | ICD-10-CM | POA: Diagnosis present

## 2016-12-31 DIAGNOSIS — R590 Localized enlarged lymph nodes: Secondary | ICD-10-CM | POA: Diagnosis not present

## 2016-12-31 DIAGNOSIS — I2510 Atherosclerotic heart disease of native coronary artery without angina pectoris: Secondary | ICD-10-CM | POA: Diagnosis not present

## 2016-12-31 MED ORDER — IOPAMIDOL (ISOVUE-300) INJECTION 61%
100.0000 mL | Freq: Once | INTRAVENOUS | Status: AC | PRN
  Administered 2016-12-31: 100 mL via INTRAVENOUS

## 2017-01-01 ENCOUNTER — Encounter: Payer: Self-pay | Admitting: Oncology

## 2017-01-01 ENCOUNTER — Inpatient Hospital Stay (HOSPITAL_BASED_OUTPATIENT_CLINIC_OR_DEPARTMENT_OTHER): Payer: Medicare Other | Admitting: Oncology

## 2017-01-01 ENCOUNTER — Inpatient Hospital Stay: Payer: Medicare Other

## 2017-01-01 VITALS — BP 108/72 | HR 96 | Temp 97.9°F | Resp 18 | Wt 198.6 lb

## 2017-01-01 DIAGNOSIS — Z79899 Other long term (current) drug therapy: Secondary | ICD-10-CM | POA: Diagnosis not present

## 2017-01-01 DIAGNOSIS — K208 Other esophagitis without bleeding: Secondary | ICD-10-CM

## 2017-01-01 DIAGNOSIS — C3411 Malignant neoplasm of upper lobe, right bronchus or lung: Secondary | ICD-10-CM

## 2017-01-01 DIAGNOSIS — C770 Secondary and unspecified malignant neoplasm of lymph nodes of head, face and neck: Secondary | ICD-10-CM | POA: Diagnosis not present

## 2017-01-01 LAB — CBC WITH DIFFERENTIAL/PLATELET
Basophils Absolute: 0 10*3/uL (ref 0–0.1)
Basophils Relative: 0 %
EOS ABS: 0.1 10*3/uL (ref 0–0.7)
Eosinophils Relative: 2 %
HCT: 32.8 % — ABNORMAL LOW (ref 40.0–52.0)
HEMOGLOBIN: 11.2 g/dL — AB (ref 13.0–18.0)
Lymphocytes Relative: 33 %
Lymphs Abs: 1.1 10*3/uL (ref 1.0–3.6)
MCH: 29.9 pg (ref 26.0–34.0)
MCHC: 34.2 g/dL (ref 32.0–36.0)
MCV: 87.4 fL (ref 80.0–100.0)
MONOS PCT: 18 %
Monocytes Absolute: 0.6 10*3/uL (ref 0.2–1.0)
NEUTROS PCT: 47 %
Neutro Abs: 1.6 10*3/uL (ref 1.4–6.5)
Platelets: 161 10*3/uL (ref 150–440)
RBC: 3.75 MIL/uL — AB (ref 4.40–5.90)
RDW: 18.1 % — ABNORMAL HIGH (ref 11.5–14.5)
WBC: 3.3 10*3/uL — AB (ref 3.8–10.6)

## 2017-01-01 LAB — COMPREHENSIVE METABOLIC PANEL
ALBUMIN: 3.2 g/dL — AB (ref 3.5–5.0)
ALK PHOS: 88 U/L (ref 38–126)
ALT: 23 U/L (ref 17–63)
ANION GAP: 8 (ref 5–15)
AST: 23 U/L (ref 15–41)
BUN: 12 mg/dL (ref 6–20)
CALCIUM: 9.6 mg/dL (ref 8.9–10.3)
CHLORIDE: 106 mmol/L (ref 101–111)
CO2: 24 mmol/L (ref 22–32)
Creatinine, Ser: 1.23 mg/dL (ref 0.61–1.24)
GFR calc Af Amer: >60 mL/min (ref >60)
GFR calc non Af Amer: 56 mL/min — ABNORMAL LOW (ref >60)
GLUCOSE: 150 mg/dL — AB (ref 65–99)
Potassium: 3.9 mmol/L (ref 3.5–5.1)
SODIUM: 138 mmol/L (ref 135–145)
Total Bilirubin: 0.6 mg/dL (ref 0.3–1.2)
Total Protein: 7.7 g/dL (ref 6.5–8.1)

## 2017-01-01 MED ORDER — OXYCODONE HCL 10 MG PO TABS: 10.0000 mg | ORAL_TABLET | Freq: Four times a day (QID) | ORAL | 0 refills | Status: DC | PRN

## 2017-01-01 NOTE — Progress Notes (Addendum)
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 Patient is a 76 year old African-American male with a past medical history significant for COPD for which she sees Dr. Humphrey Rolls. He was recently admitted to the hospital in March 2018 with symptoms of pneumonia. CT thorax at that time showed: Perihilar lung mass with associated postobstructive atelectasis and consolidation of the right upper lobe  2. This was followed by a PET CT scan on 10/10/2016 which showed:1. Hypermetabolic RIGHT hilar mass with postobstructive collapse of the RIGHT upper lobe consistent with primary or metastatic bronchogenic carcinoma. 2. Hypermetabolic RIGHT paratracheal, prevascular and RIGHT supraclavicular nodal metastasis. 3. RIGHT supraclavicular node is intensely metabolic but likely too small for biopsy. 4. RIGHT lower lobe peripheral nodular thickening with associated metabolic activity is likely inflammatory. 5. Severe bullous change in the LEFT hemithorax  3. CT abdomen from 10/10/2016 showed no evidence of metastatic disease.  4. 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 Patient has problems with bilateral lower extremity swelling as well as shortness of breath on minimal exertion. He uses nighttime oxygen. He follows up with Dr. Floyce Stakes pulmonary as well as cardiology  6. Concurrent chemo/RT started on 11/13/16. Patient completed 5 weekly cycles of chemo/RT. Plan per Rad onc is to give him radiation boost for 3 weeks  starting 01/15/17  Interval history- fatigue and leg swelling has improved. 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  HENT: Negative for congestion, ear discharge and nosebleeds.   Eyes: Negative for blurred vision.  Respiratory: Negative for cough, hemoptysis, sputum production, shortness of breath and wheezing.   Cardiovascular: Negative for chest pain, palpitations, orthopnea and claudication.  Gastrointestinal: Negative for abdominal pain, blood in stool, constipation, diarrhea, heartburn, melena, nausea and vomiting.  Genitourinary: Negative for dysuria, flank pain, frequency, hematuria and urgency.  Musculoskeletal: Negative for back pain, joint pain and myalgias.  Skin: Positive for rash.  Neurological: Negative for dizziness, tingling, focal weakness, seizures, weakness and headaches.  Endo/Heme/Allergies: Does not bruise/bleed easily.  Psychiatric/Behavioral: Negative for depression and suicidal ideas. The patient does not have insomnia.       No Known Allergies   Past Medical History:  Diagnosis Date  . Cancer ([place])    Lung CA  . CHF (congestive heart failure) ([place])   . COPD (chronic obstructive pulmonary disease) ([place])   . Coronary artery disease   . Diabetes mellitus without complication ([place])   . Fibromyalgia   . GERD (gastroesophageal reflux disease)   . Hypertension      Past Surgical History:  Procedure Laterality Date  . COLONOSCOPY WITH PROPOFOL N/A 01/18/2015   Procedure: COLONOSCOPY WITH PROPOFOL;  Surgeon: Manya Silvas, MD;  Location: [facility] ENDOSCOPY;  Service: Endoscopy;  Laterality: N/A;  . ESOPHAGOGASTRODUODENOSCOPY N/A 01/18/2015   Procedure: ESOPHAGOGASTRODUODENOSCOPY (EGD);  Surgeon: Manya Silvas, MD;  Location: [facility] ENDOSCOPY;  Service: Endoscopy;   Laterality: N/A;  .  EYE SURGERY    . FLEXIBLE BRONCHOSCOPY N/A 10/22/2016   Procedure: FLEXIBLE BRONCHOSCOPY;  Surgeon: Allyne Gee, MD;  Location: [facility] ORS;  Service: Pulmonary;  Laterality: N/A;  . HERNIA REPAIR    . IR FLUORO GUIDE PORT INSERTION LEFT  11/07/2016  . REPAIR KNEE LIGAMENT    . TONSILLECTOMY      Social History   Social History  . Marital status: Married    Spouse name: N/A  . Number of children: N/A  . Years of education: N/A   Occupational History  . Not on file.   Social History Main Topics  . Smoking status: Former Smoker    Quit date: 12/01/2003  . Smokeless tobacco: Never Used  . Alcohol use 1.2 oz/week    2 Shots of liquor per week  . Drug use: No  . Sexual activity: Not on file   Other Topics Concern  . Not on file   Social History Narrative  . No narrative on file    Family History  Problem Relation Age of Onset  . Prostate cancer Father   . Prostate cancer Brother   . Diabetes Unknown        all 13 siblings  . Hypertension Unknown        all 13 siblings     Current Outpatient Prescriptions:  .  aspirin 325 MG tablet, Take 325 mg by mouth daily., Disp: , Rfl:  .  Fluticasone Furoate-Vilanterol 100-25 MCG/INH AEPB, Inhale 1 puff into the lungs daily. , Disp: , Rfl:  .  losartan (COZAAR) 100 MG tablet, Take 100 mg by mouth daily., Disp: , Rfl:  .  simvastatin (ZOCOR) 10 MG tablet, Take 10 mg by mouth every evening. , Disp: , Rfl:  .  sitaGLIPtin (JANUVIA) 50 MG tablet, Take 50 mg by mouth daily., Disp: , Rfl:  .  acetaminophen (TYLENOL) 325 MG tablet, Take 2 tablets (650 mg total) by mouth every 6 (six) hours as needed for mild pain (or Fever >/= 101). (Patient not taking: Reported on 12/04/2016), Disp: , Rfl:  .  albuterol-ipratropium (COMBIVENT) 18-103 MCG/ACT inhaler, Inhale 2 puffs into the lungs every 6 (six) hours as needed for wheezing or shortness of breath., Disp: , Rfl:  .  benzonatate (TESSALON) 200 MG capsule, Take 1 capsule  (200 mg total) by mouth 3 (three) times daily as needed for cough. (Patient not taking: Reported on 01/01/2017), Disp: 20 capsule, Rfl: 0 .  carvedilol (COREG) 25 MG tablet, Take 25 mg by mouth 2 (two) times daily with a meal., Disp: , Rfl:  .  dexamethasone (DECADRON) 4 MG tablet, Take 2 tablets (8 mg total) by mouth daily. Start the day after chemotherapy for 2 days. (Patient not taking: Reported on 01/01/2017), Disp: 30 tablet, Rfl: 1 .  furosemide (LASIX) 40 MG tablet, Take 40 mg by mouth daily., Disp: , Rfl:  .  lidocaine-prilocaine (EMLA) cream, Apply to affected area once (Patient not taking: Reported on 01/01/2017), Disp: 30 g, Rfl: 3 .  LORazepam (ATIVAN) 0.5 MG tablet, Take 1 tablet (0.5 mg total) by mouth every 6 (six) hours as needed (Nausea or vomiting). (Patient not taking: Reported on 01/01/2017), Disp: 30 tablet, Rfl: 0 .  metFORMIN (GLUCOPHAGE) 500 MG tablet, Take 500 mg by mouth 2 (two) times daily. , Disp: , Rfl:  .  omeprazole (PRILOSEC) 40 MG capsule, Take 40 mg by mouth daily., Disp: , Rfl:  .  ondansetron (ZOFRAN) 8 MG tablet, Take 1 tablet (  8 mg total) by mouth 2 (two) times daily as needed for refractory nausea / vomiting. Start on day 3 after chemo. (Patient not taking: Reported on 12/04/2016), Disp: 30 tablet, Rfl: 1 .  oxyCODONE 10 MG TABS, Take 1 tablet (10 mg total) by mouth every 6 (six) hours as needed for severe pain., Disp: 30 tablet, Rfl: 0 .  prochlorperazine (COMPAZINE) 10 MG tablet, Take 1 tablet (10 mg total) by mouth every 6 (six) hours as needed (Nausea or vomiting). (Patient not taking: Reported on 12/04/2016), Disp: 30 tablet, Rfl: 1 .  sucralfate (CARAFATE) 1 g tablet, Take 1 tablet (1 g total) by mouth 3 (three) times daily. Dissolve in 2-3 tbsp warm water, swish and swallow. (Patient not taking: Reported on 12/24/2016), Disp: 90 tablet, Rfl: 3  Physical exam:  Vitals:   01/01/17 1112  BP: 108/72  Pulse: 96  Resp: 18  Temp: 97.9 F (36.6 C)  TempSrc:  Tympanic  Weight: 198 lb 9.6 oz (90.1 kg)   Physical Exam  Constitutional: He is oriented to person, place, and time and well-developed, well-nourished, and in no distress.  HENT:  Head: Normocephalic and atraumatic.  Eyes: EOM are normal. Pupils are equal, round, and reactive to light.  Neck: Normal range of motion.  Cardiovascular: Normal rate, regular rhythm and normal heart sounds.   Pulmonary/Chest: Effort normal and breath sounds normal.  Abdominal: Soft. Bowel sounds are normal.  Musculoskeletal: He exhibits edema (b/l 1 improved).  Neurological: He is alert and oriented to person, place, and time.  Skin: Skin is warm and dry.  Scattered pinpoint erythematous macular lesions noted over b/l upper and lower extremities     CMP Latest Ref Rng & Units 01/01/2017  Glucose 65 - 99 mg/dL 150(H)  BUN 6 - 20 mg/dL 12  Creatinine 0.61 - 1.24 mg/dL 1.23  Sodium 135 - 145 mmol/L 138  Potassium 3.5 - 5.1 mmol/L 3.9  Chloride 101 - 111 mmol/L 106  CO2 22 - 32 mmol/L 24  Calcium 8.9 - 10.3 mg/dL 9.6  Total Protein 6.5 - 8.1 g/dL 7.7  Total Bilirubin 0.3 - 1.2 mg/dL 0.6  Alkaline Phos 38 - 126 U/L 88  AST 15 - 41 U/L 23  ALT 17 - 63 U/L 23   CBC Latest Ref Rng & Units 01/01/2017  WBC 3.8 - 10.6 K/uL 3.3(L)  Hemoglobin 13.0 - 18.0 g/dL 11.2(L)  Hematocrit 40.0 - 52.0 % 32.8(L)  Platelets 150 - 440 K/uL 161    No images are attached to the encounter.  Ct Chest W Contrast  Result Date: 01/01/2017 CLINICAL DATA:  Followup lung cancer EXAM: CT CHEST, ABDOMEN, AND PELVIS WITH CONTRAST TECHNIQUE: Multidetector CT imaging of the chest, abdomen and pelvis was performed following the standard protocol during bolus administration of intravenous contrast. CONTRAST:  132m ISOVUE-300 IOPAMIDOL (ISOVUE-300) INJECTION 61% COMPARISON:  CT 09/26/16 and PET-CT 10/10/2016 FINDINGS: CT CHEST FINDINGS Cardiovascular: The heart size appears normal. Aortic atherosclerosis. Calcifications within the RCA, LAD  coronary artery is identified. No pericardial effusion. Mediastinum/Nodes: The trachea appears patent and is midline. Normal appearance of the esophagus. Index right paratracheal node measures 7 mm, image 28 of series 2. Decreased from 2.1 cm previously. Index pre-vascular lymph node within the anterior mediastinum measures 5 mm, image 16 of series 2. Previously 0.9 cm. Left paratracheal node Measures 5 mm, image 17 of series 2. Previously 9 mm. Index right hilar nodal mass measures 2.5 x 1.3 cm, image 31 of series 2. Previously  2.8 x 2.5 cm. Lungs/Pleura: Advanced changes of bullous emphysema. The right upper lobe mass measures 3.1 x 2.3 cm, image 30 of series 2. Previously 5 x 4.0 cm. Stable chronic appearing subsegmental atelectasis in the lingula. No pleural fluid. Musculoskeletal: No chest wall mass or suspicious bone lesions identified. CT ABDOMEN PELVIS FINDINGS Hepatobiliary: No focal liver abnormality is seen. No gallstones, gallbladder wall thickening, or biliary dilatation. Pancreas: Unremarkable. No pancreatic ductal dilatation or surrounding inflammatory changes. Spleen: Normal in size without focal abnormality. Adrenals/Urinary Tract: The adrenal glands appear normal. Bilateral kidney cysts. No hydronephrosis or hydroureter. The urinary bladder appears within normal limits. Stomach/Bowel: The stomach and the small bowel loops appear unremarkable. No pathologic dilatation of the colon. Vascular/Lymphatic: Aortic atherosclerosis noted. No aneurysm. No upper abdominal or pelvic adenopathy. No inguinal adenopathy. Reproductive: Prostate is unremarkable. Other: No abdominal wall hernia or abnormality. No abdominopelvic ascites. Musculoskeletal: No aggressive lytic or sclerotic bone lesions. 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 IMPRESSION: 1. Interval response to therapy. 2. Decrease in size of mediastinal and right hilar adenopathy. The dominant mass within the right upper lobe  has also decreased in size in the interval. 3. Aortic Atherosclerosis (ICD10-I70.0) and Emphysema (ICD10-J43.9). Multi vessel coronary artery calcifications noted. Electronically Signed   By: Kerby Moors M.D.   On: 01/01/2017 09:10   Ct Abdomen Pelvis W Contrast  Result Date: 01/01/2017 CLINICAL DATA:  Followup lung cancer EXAM: CT CHEST, ABDOMEN, AND PELVIS WITH CONTRAST TECHNIQUE: Multidetector CT imaging of the chest, abdomen and pelvis was performed following the standard protocol during bolus administration of intravenous contrast. CONTRAST:  155m ISOVUE-300 IOPAMIDOL (ISOVUE-300) INJECTION 61% COMPARISON:  CT 09/26/16 and PET-CT 10/10/2016 FINDINGS: CT CHEST FINDINGS Cardiovascular: The heart size appears normal. Aortic atherosclerosis. Calcifications within the RCA, LAD coronary artery is identified. No pericardial effusion. Mediastinum/Nodes: The trachea appears patent and is midline. Normal appearance of the esophagus. Index right paratracheal node measures 7 mm, image 28 of series 2. Decreased from 2.1 cm previously. Index pre-vascular lymph node within the anterior mediastinum measures 5 mm, image 16 of series 2. Previously 0.9 cm. Left paratracheal node Measures 5 mm, image 17 of series 2. Previously 9 mm. Index right hilar nodal mass measures 2.5 x 1.3 cm, image 31 of series 2. Previously 2.8 x 2.5 cm. Lungs/Pleura: Advanced changes of bullous emphysema. The right upper lobe mass measures 3.1 x 2.3 cm, image 30 of series 2. Previously 5 x 4.0 cm. Stable chronic appearing subsegmental atelectasis in the lingula. No pleural fluid. Musculoskeletal: No chest wall mass or suspicious bone lesions identified. CT ABDOMEN PELVIS FINDINGS Hepatobiliary: No focal liver abnormality is seen. No gallstones, gallbladder wall thickening, or biliary dilatation. Pancreas: Unremarkable. No pancreatic ductal dilatation or surrounding inflammatory changes. Spleen: Normal in size without focal abnormality.  Adrenals/Urinary Tract: The adrenal glands appear normal. Bilateral kidney cysts. No hydronephrosis or hydroureter. The urinary bladder appears within normal limits. Stomach/Bowel: The stomach and the small bowel loops appear unremarkable. No pathologic dilatation of the colon. Vascular/Lymphatic: Aortic atherosclerosis noted. No aneurysm. No upper abdominal or pelvic adenopathy. No inguinal adenopathy. Reproductive: Prostate is unremarkable. Other: No abdominal wall hernia or abnormality. No abdominopelvic ascites. Musculoskeletal: No aggressive lytic or sclerotic bone lesions. 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 IMPRESSION: 1. Interval response to therapy. 2. Decrease in size of mediastinal and right hilar adenopathy. The dominant mass within the right upper lobe has also decreased in size in the interval.  3. Aortic Atherosclerosis (ICD10-I70.0) and Emphysema (ICD10-J43.9). Multi vessel coronary artery calcifications noted. Electronically Signed   By: Kerby Moors M.D.   On: 01/01/2017 09:10     Assessment and plan- Patient is a 76 y.o. male SCC of RUL Stage IIIB T1N3M0 s/p 5 weekly cycles of chemo/RT  I discussed the results of CT thorax which show partial response to treatment. I will plan to restart weekly carbo/taxol X3 cycles along with RT starting 01/15/17. I will plan to give maintenance durvalumab following that Q2 weeks for 12 months following that. I will see him back in 2 weeks time on 01/15/17 with labs prior to cycle 6 of weekly carbo/taxol  Radiation esophagitis- prescription for oxycodone 10 mg po q6 prn given.  Skin rash ? Secondary to omeprazole. I have asked him to hold the drug for now and speak to his pcp     Visit Diagnosis 1. Malignant neoplasm of upper lobe of right lung [facility])      Dr. Randa Evens, MD, MPH [facility] at [facility] Pager- [phone] 01/01/2017 9:55 PM

## 2017-01-01 NOTE — Progress Notes (Signed)
Pt here for follow up. Showed staff rash on both arm-diffused light red in color- per pt very "itchy "  Both legs  As well-more distinct-also itchy per pt. Told to f/u w PCP

## 2017-01-06 ENCOUNTER — Ambulatory Visit
Admission: RE | Admit: 2017-01-06 | Discharge: 2017-01-06 | Disposition: A | Discharge status: Home / Self Care | Payer: Medicare Other | Source: Ambulatory Visit | Attending: Radiation Oncology | Admitting: Radiation Oncology

## 2017-01-06 DIAGNOSIS — C3401 Malignant neoplasm of right main bronchus: Secondary | ICD-10-CM | POA: Diagnosis not present

## 2017-01-08 DIAGNOSIS — C3401 Malignant neoplasm of right main bronchus: Secondary | ICD-10-CM | POA: Diagnosis not present

## 2017-01-13 ENCOUNTER — Ambulatory Visit
Admission: RE | Admit: 2017-01-13 | Discharge: 2017-01-13 | Disposition: A | Discharge status: Home / Self Care | Payer: Medicare Other | Source: Ambulatory Visit | Attending: Radiation Oncology | Admitting: Radiation Oncology

## 2017-01-13 DIAGNOSIS — C3401 Malignant neoplasm of right main bronchus: Secondary | ICD-10-CM | POA: Diagnosis not present

## 2017-01-15 ENCOUNTER — Inpatient Hospital Stay: Payer: Medicare Other

## 2017-01-15 ENCOUNTER — Other Ambulatory Visit: Payer: Self-pay | Admitting: *Deleted

## 2017-01-15 ENCOUNTER — Other Ambulatory Visit: Payer: Self-pay | Admitting: Oncology

## 2017-01-15 ENCOUNTER — Inpatient Hospital Stay: Payer: Medicare Other | Attending: Oncology | Admitting: Oncology

## 2017-01-15 ENCOUNTER — Encounter: Payer: Self-pay | Admitting: Oncology

## 2017-01-15 ENCOUNTER — Ambulatory Visit
Admission: RE | Admit: 2017-01-15 | Discharge: 2017-01-15 | Disposition: A | Discharge status: Home / Self Care | Payer: Medicare Other | Source: Ambulatory Visit | Attending: Radiation Oncology | Admitting: Radiation Oncology

## 2017-01-15 VITALS — BP 123/77 | HR 101 | Temp 98.1°F | Resp 18 | Wt 200.6 lb

## 2017-01-15 DIAGNOSIS — Z7984 Long term (current) use of oral hypoglycemic drugs: Secondary | ICD-10-CM | POA: Diagnosis not present

## 2017-01-15 DIAGNOSIS — C770 Secondary and unspecified malignant neoplasm of lymph nodes of head, face and neck: Secondary | ICD-10-CM

## 2017-01-15 DIAGNOSIS — Z79899 Other long term (current) drug therapy: Secondary | ICD-10-CM | POA: Insufficient documentation

## 2017-01-15 DIAGNOSIS — L819 Disorder of pigmentation, unspecified: Secondary | ICD-10-CM | POA: Insufficient documentation

## 2017-01-15 DIAGNOSIS — I110 Hypertensive heart disease with heart failure: Secondary | ICD-10-CM | POA: Diagnosis not present

## 2017-01-15 DIAGNOSIS — J449 Chronic obstructive pulmonary disease, unspecified: Secondary | ICD-10-CM | POA: Diagnosis not present

## 2017-01-15 DIAGNOSIS — Z7982 Long term (current) use of aspirin: Secondary | ICD-10-CM | POA: Diagnosis not present

## 2017-01-15 DIAGNOSIS — N281 Cyst of kidney, acquired: Secondary | ICD-10-CM | POA: Diagnosis not present

## 2017-01-15 DIAGNOSIS — Z5111 Encounter for antineoplastic chemotherapy: Secondary | ICD-10-CM | POA: Diagnosis not present

## 2017-01-15 DIAGNOSIS — C3411 Malignant neoplasm of upper lobe, right bronchus or lung: Secondary | ICD-10-CM | POA: Diagnosis not present

## 2017-01-15 DIAGNOSIS — C3401 Malignant neoplasm of right main bronchus: Secondary | ICD-10-CM | POA: Diagnosis not present

## 2017-01-15 DIAGNOSIS — E119 Type 2 diabetes mellitus without complications: Secondary | ICD-10-CM | POA: Diagnosis not present

## 2017-01-15 DIAGNOSIS — I509 Heart failure, unspecified: Secondary | ICD-10-CM | POA: Diagnosis not present

## 2017-01-15 DIAGNOSIS — M797 Fibromyalgia: Secondary | ICD-10-CM | POA: Diagnosis not present

## 2017-01-15 DIAGNOSIS — Y842 Radiological procedure and radiotherapy as the cause of abnormal reaction of the patient, or of later complication, without mention of misadventure at the time of the procedure: Secondary | ICD-10-CM | POA: Insufficient documentation

## 2017-01-15 DIAGNOSIS — K219 Gastro-esophageal reflux disease without esophagitis: Secondary | ICD-10-CM | POA: Diagnosis not present

## 2017-01-15 DIAGNOSIS — I700 Atherosclerosis of aorta: Secondary | ICD-10-CM | POA: Diagnosis not present

## 2017-01-15 DIAGNOSIS — M5137 Other intervertebral disc degeneration, lumbosacral region: Secondary | ICD-10-CM | POA: Insufficient documentation

## 2017-01-15 DIAGNOSIS — Z87891 Personal history of nicotine dependence: Secondary | ICD-10-CM | POA: Insufficient documentation

## 2017-01-15 LAB — CBC WITH DIFFERENTIAL/PLATELET
BASOS ABS: 0 10*3/uL (ref 0–0.1)
Basophils Relative: 0 %
Eosinophils Absolute: 0.3 10*3/uL (ref 0–0.7)
Eosinophils Relative: 5 %
HEMATOCRIT: 32.9 % — AB (ref 40.0–52.0)
Hemoglobin: 11.2 g/dL — ABNORMAL LOW (ref 13.0–18.0)
LYMPHS PCT: 24 %
Lymphs Abs: 1.3 10*3/uL (ref 1.0–3.6)
MCH: 30.3 pg (ref 26.0–34.0)
MCHC: 34 g/dL (ref 32.0–36.0)
MCV: 89.2 fL (ref 80.0–100.0)
Monocytes Absolute: 1.2 10*3/uL — ABNORMAL HIGH (ref 0.2–1.0)
Monocytes Relative: 23 %
NEUTROS ABS: 2.6 10*3/uL (ref 1.4–6.5)
Neutrophils Relative %: 48 %
Platelets: 184 10*3/uL (ref 150–440)
RBC: 3.69 MIL/uL — AB (ref 4.40–5.90)
RDW: 19.1 % — ABNORMAL HIGH (ref 11.5–14.5)
WBC: 5.3 10*3/uL (ref 3.8–10.6)

## 2017-01-15 LAB — COMPREHENSIVE METABOLIC PANEL
ALK PHOS: 92 U/L (ref 38–126)
ALT: 17 U/L (ref 17–63)
AST: 18 U/L (ref 15–41)
Albumin: 3.3 g/dL — ABNORMAL LOW (ref 3.5–5.0)
Anion gap: 7 (ref 5–15)
BILIRUBIN TOTAL: 0.5 mg/dL (ref 0.3–1.2)
BUN: 12 mg/dL (ref 6–20)
CO2: 24 mmol/L (ref 22–32)
CREATININE: 1.12 mg/dL (ref 0.61–1.24)
Calcium: 9.4 mg/dL (ref 8.9–10.3)
Chloride: 107 mmol/L (ref 101–111)
GFR calc Af Amer: >60 mL/min (ref >60)
Glucose, Bld: 116 mg/dL — ABNORMAL HIGH (ref 65–99)
Potassium: 3.9 mmol/L (ref 3.5–5.1)
Sodium: 138 mmol/L (ref 135–145)
TOTAL PROTEIN: 8 g/dL (ref 6.5–8.1)

## 2017-01-15 MED ORDER — PACLITAXEL CHEMO INJECTION 300 MG/50ML
45.0000 mg/m2 | Freq: Once | INTRAVENOUS | Status: AC
  Administered 2017-01-15: 96 mg via INTRAVENOUS
  Filled 2017-01-15: qty 16

## 2017-01-15 MED ORDER — SODIUM CHLORIDE 0.9 % IV SOLN
200.0000 mg | Freq: Once | INTRAVENOUS | Status: AC
  Administered 2017-01-15: 200 mg via INTRAVENOUS
  Filled 2017-01-15: qty 20

## 2017-01-15 MED ORDER — HEPARIN SOD (PORK) LOCK FLUSH 100 UNIT/ML IV SOLN
500.0000 [IU] | Freq: Once | INTRAVENOUS | Status: AC | PRN
  Administered 2017-01-15: 500 [IU]

## 2017-01-15 MED ORDER — DEXAMETHASONE SODIUM PHOSPHATE 100 MG/10ML IJ SOLN
20.0000 mg | Freq: Once | INTRAMUSCULAR | Status: AC
  Administered 2017-01-15: 20 mg via INTRAVENOUS
  Filled 2017-01-15: qty 2

## 2017-01-15 MED ORDER — SODIUM CHLORIDE 0.9 % IV SOLN
Freq: Once | INTRAVENOUS | Status: AC
  Administered 2017-01-15: 12:00:00 via INTRAVENOUS
  Filled 2017-01-15: qty 1000

## 2017-01-15 MED ORDER — DIPHENHYDRAMINE HCL 50 MG/ML IJ SOLN
50.0000 mg | Freq: Once | INTRAMUSCULAR | Status: AC
  Administered 2017-01-15: 50 mg via INTRAVENOUS

## 2017-01-15 MED ORDER — FAMOTIDINE IN NACL 20-0.9 MG/50ML-% IV SOLN
20.0000 mg | Freq: Once | INTRAVENOUS | Status: AC
  Administered 2017-01-15: 20 mg via INTRAVENOUS

## 2017-01-15 MED ORDER — PALONOSETRON HCL INJECTION 0.25 MG/5ML
0.2500 mg | Freq: Once | INTRAVENOUS | Status: AC
  Administered 2017-01-15: 0.25 mg via INTRAVENOUS

## 2017-01-15 MED ORDER — OXYCODONE HCL 10 MG PO TABS: 10.0000 mg | ORAL_TABLET | Freq: Four times a day (QID) | ORAL | 0 refills | Status: DC | PRN

## 2017-01-15 MED ORDER — SODIUM CHLORIDE 0.9% FLUSH
10.0000 mL | INTRAVENOUS | Status: DC | PRN
  Filled 2017-01-15: qty 10

## 2017-01-15 NOTE — Progress Notes (Signed)
Patient has been on Carbo 180mg , however Cr has improved and dose change is greater than 10%, so will change dose to 200mg .

## 2017-01-15 NOTE — Progress Notes (Signed)
Patient continues to have sore throat from radiation.  Otherwise no complaints.

## 2017-01-15 NOTE — Progress Notes (Signed)
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 Patient is a 76 year old African-American male with a past medical history significant for COPD for which she sees Dr. Humphrey Rolls. He was recently admitted to the hospital in March 2018 with symptoms of pneumonia. CT thorax at that time showed: Perihilar lung mass with associated postobstructive atelectasis and consolidation of the right upper lobe  2. This was followed by a PET CT scan on 10/10/2016 which showed:1. Hypermetabolic RIGHT hilar mass with postobstructive collapse of the RIGHT upper lobe consistent with primary or metastatic bronchogenic carcinoma. 2. Hypermetabolic RIGHT paratracheal, prevascular and RIGHT supraclavicular nodal metastasis. 3. RIGHT supraclavicular node is intensely metabolic but likely too small for biopsy. 4. RIGHT lower lobe peripheral nodular thickening with associated metabolic activity is likely inflammatory. 5. Severe bullous change in the LEFT hemithorax  3. CT abdomen from 10/10/2016 showed no evidence of metastatic disease.  4. 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 Patient has problems with bilateral lower extremity swelling as well as shortness of breath on minimal exertion. He uses nighttime oxygen. He follows up with Dr. Floyce Stakes pulmonary as well as cardiology  6. Concurrent chemo/RT started on 11/13/16. Patient completed 5 weekly cycles of  chemo/RT. Plan per Rad onc is to give him radiation boost for 3 weeks starting 01/15/17  Interval history- doing well. Denies any complaints. He does need oxycodone for throat pain once he restarts radiation  ECOG PS- 1 Pain scale- 0 Opioid associated constipation- no  Review of systems- Review of Systems  Constitutional: Negative for chills, fever, malaise/fatigue and weight loss.  HENT: Positive for sore throat. Negative for congestion, ear discharge and nosebleeds.   Eyes: Negative for blurred vision.  Respiratory: Negative for cough, hemoptysis, sputum production, shortness of breath and wheezing.   Cardiovascular: Negative for chest pain, palpitations, orthopnea and claudication.  Gastrointestinal: Negative for abdominal pain, blood in stool, constipation, diarrhea, heartburn, melena, nausea and vomiting.  Genitourinary: Negative for dysuria, flank pain, frequency, hematuria and urgency.  Musculoskeletal: Negative for back pain, joint pain and myalgias.  Skin: Positive for rash.  Neurological: Negative for dizziness, tingling, focal weakness, seizures, weakness and headaches.  Endo/Heme/Allergies: Does not bruise/bleed easily.  Psychiatric/Behavioral: Negative for depression and suicidal ideas. The patient does not have insomnia.       No Known Allergies   Past Medical History:  Diagnosis Date  . Cancer ([place])    Lung CA  . CHF (congestive heart failure) ([place])   . COPD (chronic obstructive pulmonary disease) ([place])   . Coronary artery disease   . Diabetes mellitus without complication ([place])   . Fibromyalgia   . GERD (gastroesophageal reflux disease)   . Hypertension      Past Surgical History:  Procedure Laterality Date  . COLONOSCOPY WITH PROPOFOL N/A 01/18/2015   Procedure: COLONOSCOPY WITH PROPOFOL;  Surgeon: Manya Silvas, MD;  Location: [facility] ENDOSCOPY;  Service: Endoscopy;  Laterality: N/A;  . ESOPHAGOGASTRODUODENOSCOPY N/A 01/18/2015   Procedure:  ESOPHAGOGASTRODUODENOSCOPY (EGD);  Surgeon: Manya Silvas, MD;  Location: [facility] ENDOSCOPY;  Service: Endoscopy;  Laterality: N/A;  . EYE SURGERY    . FLEXIBLE BRONCHOSCOPY N/A 10/22/2016   Procedure: FLEXIBLE BRONCHOSCOPY;  Surgeon: Allyne Gee, MD;  Location: [facility] ORS;  Service: Pulmonary;  Laterality: N/A;  . HERNIA REPAIR    . IR FLUORO GUIDE PORT INSERTION LEFT  11/07/2016  . REPAIR KNEE LIGAMENT    . TONSILLECTOMY      Social History   Social History  . Marital status: Married    Spouse name: N/A  . Number of children: N/A  . Years of education: N/A   Occupational History  . Not on file.   Social History Main Topics  . Smoking status: Former Smoker    Quit date: 12/01/2003  . Smokeless tobacco: Never Used  . Alcohol use 1.2 oz/week    2 Shots of liquor per week  . Drug use: No  . Sexual activity: Not on file   Other Topics Concern  . Not on file   Social History Narrative  . No narrative on file    Family History  Problem Relation Age of Onset  . Prostate cancer Father   . Prostate cancer Brother   . Diabetes Unknown        all 13 siblings  . Hypertension Unknown        all 13 siblings     Current Outpatient Prescriptions:  .  albuterol-ipratropium (COMBIVENT) 18-103 MCG/ACT inhaler, Inhale 2 puffs into the lungs every 6 (six) hours as needed for wheezing or shortness of breath., Disp: , Rfl:  .  aspirin 325 MG tablet, Take 325 mg by mouth daily., Disp: , Rfl:  .  carvedilol (COREG) 25 MG tablet, Take 25 mg by mouth 2 (two) times daily with a meal., Disp: , Rfl:  .  Fluticasone Furoate-Vilanterol 100-25 MCG/INH AEPB, Inhale 1 puff into the lungs daily. , Disp: , Rfl:  .  furosemide (LASIX) 40 MG tablet, Take 40 mg by mouth daily., Disp: , Rfl:  .  lidocaine-prilocaine (EMLA) cream, Apply to affected area once, Disp: 30 g, Rfl: 3 .  metFORMIN (GLUCOPHAGE) 500 MG tablet, Take 500 mg by mouth 2 (two) times daily. , Disp: , Rfl:  .  omeprazole (PRILOSEC) 40  MG capsule, Take 40 mg by mouth daily., Disp: , Rfl:  .  Oxycodone HCl 10 MG TABS, Take 1 tablet (10 mg total) by mouth every 6 (six) hours as needed., Disp: 30 tablet, Rfl: 0 .  simvastatin (ZOCOR) 10 MG tablet, Take 10 mg by mouth every evening. , Disp: , Rfl:  .  sitaGLIPtin (JANUVIA) 50 MG tablet, Take 50 mg by mouth daily., Disp: , Rfl:  .  acetaminophen (TYLENOL) 325 MG tablet, Take 2 tablets (650 mg total) by mouth every 6 (six) hours as needed for mild pain (or Fever >/= 101). (Patient not taking: Reported on 12/04/2016), Disp: , Rfl:  .  benzonatate (TESSALON) 200 MG capsule, Take 1 capsule (200 mg total) by mouth 3 (three) times daily as needed for cough. (Patient not taking: Reported on 01/01/2017), Disp: 20 capsule, Rfl: 0 .  dexamethasone (DECADRON) 4 MG tablet, Take 2 tablets (8 mg total) by mouth daily. Start the day after chemotherapy for 2 days. (Patient not taking: Reported on 01/01/2017), Disp: 30 tablet, Rfl: 1 .  LORazepam (ATIVAN) 0.5 MG tablet, Take 1 tablet (0.5 mg total) by mouth every 6 (six) hours as needed (Nausea  or vomiting). (Patient not taking: Reported on 01/01/2017), Disp: 30 tablet, Rfl: 0 .  losartan (COZAAR) 100 MG tablet, Take 100 mg by mouth daily., Disp: , Rfl:  .  ondansetron (ZOFRAN) 8 MG tablet, Take 1 tablet (8 mg total) by mouth 2 (two) times daily as needed for refractory nausea / vomiting. Start on day 3 after chemo. (Patient not taking: Reported on 12/04/2016), Disp: 30 tablet, Rfl: 1 .  prochlorperazine (COMPAZINE) 10 MG tablet, Take 1 tablet (10 mg total) by mouth every 6 (six) hours as needed (Nausea or vomiting). (Patient not taking: Reported on 12/04/2016), Disp: 30 tablet, Rfl: 1 .  sucralfate (CARAFATE) 1 g tablet, Take 1 tablet (1 g total) by mouth 3 (three) times daily. Dissolve in 2-3 tbsp warm water, swish and swallow. (Patient not taking: Reported on 12/24/2016), Disp: 90 tablet, Rfl: 3  Physical exam:  Vitals:   01/15/17 1055  BP: 123/77  Pulse:  (!) 101  Resp: 18  Temp: 98.1 F (36.7 C)  TempSrc: Tympanic  Weight: 200 lb 9 oz (91 kg)   Physical Exam  Constitutional: He is oriented to person, place, and time and well-developed, well-nourished, and in no distress.  HENT:  Head: Normocephalic and atraumatic.  Mouth/Throat: Oropharynx is clear and moist.  Eyes: EOM are normal. Pupils are equal, round, and reactive to light.  Neck: Normal range of motion.  Cardiovascular: Normal rate, regular rhythm and normal heart sounds.   Pulmonary/Chest: Effort normal and breath sounds normal.  Abdominal: Soft. Bowel sounds are normal.  Neurological: He is alert and oriented to person, place, and time.  Skin: Skin is warm and dry.  Patch of hyperpigmented dark skin noted over back     CMP Latest Ref Rng & Units 01/15/2017  Glucose 65 - 99 mg/dL 116(H)  BUN 6 - 20 mg/dL 12  Creatinine 0.61 - 1.24 mg/dL 1.12  Sodium 135 - 145 mmol/L 138  Potassium 3.5 - 5.1 mmol/L 3.9  Chloride 101 - 111 mmol/L 107  CO2 22 - 32 mmol/L 24  Calcium 8.9 - 10.3 mg/dL 9.4  Total Protein 6.5 - 8.1 g/dL 8.0  Total Bilirubin 0.3 - 1.2 mg/dL 0.5  Alkaline Phos 38 - 126 U/L 92  AST 15 - 41 U/L 18  ALT 17 - 63 U/L 17   CBC Latest Ref Rng & Units 01/15/2017  WBC 3.8 - 10.6 K/uL 5.3  Hemoglobin 13.0 - 18.0 g/dL 11.2(L)  Hematocrit 40.0 - 52.0 % 32.9(L)  Platelets 150 - 440 K/uL 184    No images are attached to the encounter.  Ct Chest W Contrast  Result Date: 01/01/2017 CLINICAL DATA:  Followup lung cancer EXAM: CT CHEST, ABDOMEN, AND PELVIS WITH CONTRAST TECHNIQUE: Multidetector CT imaging of the chest, abdomen and pelvis was performed following the standard protocol during bolus administration of intravenous contrast. CONTRAST:  160m ISOVUE-300 IOPAMIDOL (ISOVUE-300) INJECTION 61% COMPARISON:  CT 09/26/16 and PET-CT 10/10/2016 FINDINGS: CT CHEST FINDINGS Cardiovascular: The heart size appears normal. Aortic atherosclerosis. Calcifications within the RCA, LAD  coronary artery is identified. No pericardial effusion. Mediastinum/Nodes: The trachea appears patent and is midline. Normal appearance of the esophagus. Index right paratracheal node measures 7 mm, image 28 of series 2. Decreased from 2.1 cm previously. Index pre-vascular lymph node within the anterior mediastinum measures 5 mm, image 16 of series 2. Previously 0.9 cm. Left paratracheal node Measures 5 mm, image 17 of series 2. Previously 9 mm. Index right hilar nodal mass measures 2.5 x  1.3 cm, image 31 of series 2. Previously 2.8 x 2.5 cm. Lungs/Pleura: Advanced changes of bullous emphysema. The right upper lobe mass measures 3.1 x 2.3 cm, image 30 of series 2. Previously 5 x 4.0 cm. Stable chronic appearing subsegmental atelectasis in the lingula. No pleural fluid. Musculoskeletal: No chest wall mass or suspicious bone lesions identified. CT ABDOMEN PELVIS FINDINGS Hepatobiliary: No focal liver abnormality is seen. No gallstones, gallbladder wall thickening, or biliary dilatation. Pancreas: Unremarkable. No pancreatic ductal dilatation or surrounding inflammatory changes. Spleen: Normal in size without focal abnormality. Adrenals/Urinary Tract: The adrenal glands appear normal. Bilateral kidney cysts. No hydronephrosis or hydroureter. The urinary bladder appears within normal limits. Stomach/Bowel: The stomach and the small bowel loops appear unremarkable. No pathologic dilatation of the colon. Vascular/Lymphatic: Aortic atherosclerosis noted. No aneurysm. No upper abdominal or pelvic adenopathy. No inguinal adenopathy. Reproductive: Prostate is unremarkable. Other: No abdominal wall hernia or abnormality. No abdominopelvic ascites. Musculoskeletal: No aggressive lytic or sclerotic bone lesions. 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 IMPRESSION: 1. Interval response to therapy. 2. Decrease in size of mediastinal and right hilar adenopathy. The dominant mass within the right upper lobe  has also decreased in size in the interval. 3. Aortic Atherosclerosis (ICD10-I70.0) and Emphysema (ICD10-J43.9). Multi vessel coronary artery calcifications noted. Electronically Signed   By: Kerby Moors M.D.   On: 01/01/2017 09:10   Ct Abdomen Pelvis W Contrast  Result Date: 01/01/2017 CLINICAL DATA:  Followup lung cancer EXAM: CT CHEST, ABDOMEN, AND PELVIS WITH CONTRAST TECHNIQUE: Multidetector CT imaging of the chest, abdomen and pelvis was performed following the standard protocol during bolus administration of intravenous contrast. CONTRAST:  168m ISOVUE-300 IOPAMIDOL (ISOVUE-300) INJECTION 61% COMPARISON:  CT 09/26/16 and PET-CT 10/10/2016 FINDINGS: CT CHEST FINDINGS Cardiovascular: The heart size appears normal. Aortic atherosclerosis. Calcifications within the RCA, LAD coronary artery is identified. No pericardial effusion. Mediastinum/Nodes: The trachea appears patent and is midline. Normal appearance of the esophagus. Index right paratracheal node measures 7 mm, image 28 of series 2. Decreased from 2.1 cm previously. Index pre-vascular lymph node within the anterior mediastinum measures 5 mm, image 16 of series 2. Previously 0.9 cm. Left paratracheal node Measures 5 mm, image 17 of series 2. Previously 9 mm. Index right hilar nodal mass measures 2.5 x 1.3 cm, image 31 of series 2. Previously 2.8 x 2.5 cm. Lungs/Pleura: Advanced changes of bullous emphysema. The right upper lobe mass measures 3.1 x 2.3 cm, image 30 of series 2. Previously 5 x 4.0 cm. Stable chronic appearing subsegmental atelectasis in the lingula. No pleural fluid. Musculoskeletal: No chest wall mass or suspicious bone lesions identified. CT ABDOMEN PELVIS FINDINGS Hepatobiliary: No focal liver abnormality is seen. No gallstones, gallbladder wall thickening, or biliary dilatation. Pancreas: Unremarkable. No pancreatic ductal dilatation or surrounding inflammatory changes. Spleen: Normal in size without focal abnormality.  Adrenals/Urinary Tract: The adrenal glands appear normal. Bilateral kidney cysts. No hydronephrosis or hydroureter. The urinary bladder appears within normal limits. Stomach/Bowel: The stomach and the small bowel loops appear unremarkable. No pathologic dilatation of the colon. Vascular/Lymphatic: Aortic atherosclerosis noted. No aneurysm. No upper abdominal or pelvic adenopathy. No inguinal adenopathy. Reproductive: Prostate is unremarkable. Other: No abdominal wall hernia or abnormality. No abdominopelvic ascites. Musculoskeletal: No aggressive lytic or sclerotic bone lesions. 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 IMPRESSION: 1. Interval response to therapy. 2. Decrease in size of mediastinal and right hilar adenopathy. The dominant mass within the right upper lobe  has also decreased in size in the interval. 3. Aortic Atherosclerosis (ICD10-I70.0) and Emphysema (ICD10-J43.9). Multi vessel coronary artery calcifications noted. Electronically Signed   By: Kerby Moors M.D.   On: 01/01/2017 09:10     Assessment and plan- Patient is a 76 y.o. male  SCC of RUL Stage IIIB T1N3M0 s/p 5 weekly cycles of chemo/RT  Radiation boost restarted today and will continue for 3 weeks. Will restart weekly carbo/taxol at this point. Counts okay to proceed with cycle #6 of weekly carbotaxol today. He will proceed with cycle #7 of chemotherapy next week with labs and I will see him back in 2 weeks with CBC and CMP for last dose of weekly carbo/taxol.  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 Encounter for antineoplastic chemotherapy   2. Malignant neoplasm of upper lobe of right lung [facility])      Dr. Randa Evens, MD, MPH [facility] at [facility] Pager- [phone] 01/15/2017 11:22 AM

## 2017-01-16 ENCOUNTER — Ambulatory Visit
Admission: RE | Admit: 2017-01-16 | Discharge: 2017-01-16 | Disposition: A | Discharge status: Home / Self Care | Payer: Medicare Other | Source: Ambulatory Visit | Attending: Radiation Oncology | Admitting: Radiation Oncology

## 2017-01-16 DIAGNOSIS — C3401 Malignant neoplasm of right main bronchus: Secondary | ICD-10-CM | POA: Diagnosis not present

## 2017-01-19 ENCOUNTER — Ambulatory Visit
Admission: RE | Admit: 2017-01-19 | Discharge: 2017-01-19 | Disposition: A | Discharge status: Home / Self Care | Payer: Medicare Other | Source: Ambulatory Visit | Attending: Radiation Oncology | Admitting: Radiation Oncology

## 2017-01-19 DIAGNOSIS — C3401 Malignant neoplasm of right main bronchus: Secondary | ICD-10-CM | POA: Diagnosis not present

## 2017-01-20 ENCOUNTER — Ambulatory Visit
Admission: RE | Admit: 2017-01-20 | Discharge: 2017-01-20 | Disposition: A | Discharge status: Home / Self Care | Payer: Medicare Other | Source: Ambulatory Visit | Attending: Radiation Oncology | Admitting: Radiation Oncology

## 2017-01-20 DIAGNOSIS — C3401 Malignant neoplasm of right main bronchus: Secondary | ICD-10-CM | POA: Diagnosis not present

## 2017-01-21 ENCOUNTER — Ambulatory Visit
Admission: RE | Admit: 2017-01-21 | Discharge: 2017-01-21 | Disposition: A | Discharge status: Home / Self Care | Payer: Medicare Other | Source: Ambulatory Visit | Attending: Radiation Oncology | Admitting: Radiation Oncology

## 2017-01-21 DIAGNOSIS — C3401 Malignant neoplasm of right main bronchus: Secondary | ICD-10-CM | POA: Diagnosis not present

## 2017-01-22 ENCOUNTER — Ambulatory Visit
Admission: RE | Admit: 2017-01-22 | Discharge: 2017-01-22 | Disposition: A | Discharge status: Home / Self Care | Payer: Medicare Other | Source: Ambulatory Visit | Attending: Radiation Oncology | Admitting: Radiation Oncology

## 2017-01-22 DIAGNOSIS — C3401 Malignant neoplasm of right main bronchus: Secondary | ICD-10-CM | POA: Diagnosis not present

## 2017-01-23 ENCOUNTER — Ambulatory Visit
Admission: RE | Admit: 2017-01-23 | Discharge: 2017-01-23 | Disposition: A | Discharge status: Home / Self Care | Payer: Medicare Other | Source: Ambulatory Visit | Attending: Radiation Oncology | Admitting: Radiation Oncology

## 2017-01-23 ENCOUNTER — Encounter: Payer: Self-pay | Admitting: Internal Medicine

## 2017-01-23 ENCOUNTER — Inpatient Hospital Stay: Payer: Medicare Other

## 2017-01-23 VITALS — BP 112/76 | HR 96 | Temp 98.4°F | Resp 18

## 2017-01-23 DIAGNOSIS — C3401 Malignant neoplasm of right main bronchus: Secondary | ICD-10-CM | POA: Diagnosis not present

## 2017-01-23 DIAGNOSIS — Z5111 Encounter for antineoplastic chemotherapy: Secondary | ICD-10-CM | POA: Diagnosis not present

## 2017-01-23 DIAGNOSIS — C3411 Malignant neoplasm of upper lobe, right bronchus or lung: Secondary | ICD-10-CM

## 2017-01-23 LAB — CBC WITH DIFFERENTIAL/PLATELET
BASOS ABS: 0 10*3/uL (ref 0–0.1)
Basophils Relative: 0 %
EOS PCT: 5 %
Eosinophils Absolute: 0.2 10*3/uL (ref 0–0.7)
HCT: 32.6 % — ABNORMAL LOW (ref 40.0–52.0)
Hemoglobin: 11.1 g/dL — ABNORMAL LOW (ref 13.0–18.0)
LYMPHS ABS: 0.9 10*3/uL — AB (ref 1.0–3.6)
LYMPHS PCT: 18 %
MCH: 30.4 pg (ref 26.0–34.0)
MCHC: 33.9 g/dL (ref 32.0–36.0)
MCV: 89.8 fL (ref 80.0–100.0)
MONO ABS: 0.8 10*3/uL (ref 0.2–1.0)
MONOS PCT: 16 %
Neutro Abs: 3 10*3/uL (ref 1.4–6.5)
Neutrophils Relative %: 61 %
PLATELETS: 197 10*3/uL (ref 150–440)
RBC: 3.63 MIL/uL — ABNORMAL LOW (ref 4.40–5.90)
RDW: 19.1 % — AB (ref 11.5–14.5)
WBC: 4.9 10*3/uL (ref 3.8–10.6)

## 2017-01-23 LAB — COMPREHENSIVE METABOLIC PANEL
ALBUMIN: 3.3 g/dL — AB (ref 3.5–5.0)
ALK PHOS: 94 U/L (ref 38–126)
ALT: 16 U/L — ABNORMAL LOW (ref 17–63)
AST: 17 U/L (ref 15–41)
Anion gap: 5 (ref 5–15)
BILIRUBIN TOTAL: 0.4 mg/dL (ref 0.3–1.2)
BUN: 10 mg/dL (ref 6–20)
CO2: 25 mmol/L (ref 22–32)
Calcium: 9.7 mg/dL (ref 8.9–10.3)
Chloride: 108 mmol/L (ref 101–111)
Creatinine, Ser: 1.06 mg/dL (ref 0.61–1.24)
GFR calc Af Amer: >60 mL/min (ref >60)
GFR calc non Af Amer: >60 mL/min (ref >60)
GLUCOSE: 124 mg/dL — AB (ref 65–99)
POTASSIUM: 4.2 mmol/L (ref 3.5–5.1)
Sodium: 138 mmol/L (ref 135–145)
TOTAL PROTEIN: 7.8 g/dL (ref 6.5–8.1)

## 2017-01-23 MED ORDER — DIPHENHYDRAMINE HCL 50 MG/ML IJ SOLN
50.0000 mg | Freq: Once | INTRAMUSCULAR | Status: AC
  Administered 2017-01-23: 50 mg via INTRAVENOUS
  Filled 2017-01-23: qty 1

## 2017-01-23 MED ORDER — SODIUM CHLORIDE 0.9 % IV SOLN
20.0000 mg | Freq: Once | INTRAVENOUS | Status: AC
  Administered 2017-01-23: 20 mg via INTRAVENOUS
  Filled 2017-01-23: qty 2

## 2017-01-23 MED ORDER — SODIUM CHLORIDE 0.9 % IV SOLN
Freq: Once | INTRAVENOUS | Status: AC
  Administered 2017-01-23: 11:00:00 via INTRAVENOUS
  Filled 2017-01-23: qty 1000

## 2017-01-23 MED ORDER — CARBOPLATIN CHEMO INJECTION 450 MG/45ML
200.0000 mg | Freq: Once | INTRAVENOUS | Status: AC
  Administered 2017-01-23: 200 mg via INTRAVENOUS
  Filled 2017-01-23: qty 20

## 2017-01-23 MED ORDER — FAMOTIDINE IN NACL 20-0.9 MG/50ML-% IV SOLN
20.0000 mg | Freq: Once | INTRAVENOUS | Status: AC
  Administered 2017-01-23: 20 mg via INTRAVENOUS
  Filled 2017-01-23: qty 50

## 2017-01-23 MED ORDER — PALONOSETRON HCL INJECTION 0.25 MG/5ML
0.2500 mg | Freq: Once | INTRAVENOUS | Status: AC
  Administered 2017-01-23: 0.25 mg via INTRAVENOUS
  Filled 2017-01-23: qty 5

## 2017-01-23 MED ORDER — SODIUM CHLORIDE 0.9 % IV SOLN
45.0000 mg/m2 | Freq: Once | INTRAVENOUS | Status: AC
  Administered 2017-01-23: 96 mg via INTRAVENOUS
  Filled 2017-01-23: qty 16

## 2017-01-23 MED ORDER — HEPARIN SOD (PORK) LOCK FLUSH 100 UNIT/ML IV SOLN
500.0000 [IU] | Freq: Once | INTRAVENOUS | Status: AC | PRN
  Administered 2017-01-23: 500 [IU]
  Filled 2017-01-23: qty 5

## 2017-01-23 MED ORDER — SODIUM CHLORIDE 0.9% FLUSH
10.0000 mL | INTRAVENOUS | Status: DC | PRN
  Administered 2017-01-23: 10 mL
  Filled 2017-01-23: qty 10

## 2017-01-26 ENCOUNTER — Ambulatory Visit
Admission: RE | Admit: 2017-01-26 | Discharge: 2017-01-26 | Disposition: A | Discharge status: Home / Self Care | Payer: Medicare Other | Source: Ambulatory Visit | Attending: Radiation Oncology | Admitting: Radiation Oncology

## 2017-01-26 DIAGNOSIS — C3401 Malignant neoplasm of right main bronchus: Secondary | ICD-10-CM | POA: Diagnosis not present

## 2017-01-27 ENCOUNTER — Ambulatory Visit
Admission: RE | Admit: 2017-01-27 | Discharge: 2017-01-27 | Disposition: A | Discharge status: Home / Self Care | Payer: Medicare Other | Source: Ambulatory Visit | Attending: Radiation Oncology | Admitting: Radiation Oncology

## 2017-01-27 DIAGNOSIS — C3401 Malignant neoplasm of right main bronchus: Secondary | ICD-10-CM | POA: Diagnosis not present

## 2017-01-28 ENCOUNTER — Ambulatory Visit
Admission: RE | Admit: 2017-01-28 | Discharge: 2017-01-28 | Disposition: A | Discharge status: Home / Self Care | Payer: Medicare Other | Source: Ambulatory Visit | Attending: Radiation Oncology | Admitting: Radiation Oncology

## 2017-01-28 DIAGNOSIS — C3401 Malignant neoplasm of right main bronchus: Secondary | ICD-10-CM | POA: Diagnosis not present

## 2017-01-29 ENCOUNTER — Ambulatory Visit
Admission: RE | Admit: 2017-01-29 | Discharge: 2017-01-29 | Disposition: A | Discharge status: Home / Self Care | Payer: Medicare Other | Source: Ambulatory Visit | Attending: Radiation Oncology | Admitting: Radiation Oncology

## 2017-01-29 DIAGNOSIS — C3401 Malignant neoplasm of right main bronchus: Secondary | ICD-10-CM | POA: Diagnosis not present

## 2017-01-30 ENCOUNTER — Other Ambulatory Visit: Payer: Self-pay | Admitting: *Deleted

## 2017-01-30 ENCOUNTER — Ambulatory Visit
Admission: RE | Admit: 2017-01-30 | Discharge: 2017-01-30 | Disposition: A | Discharge status: Home / Self Care | Payer: Medicare Other | Source: Ambulatory Visit | Attending: Radiation Oncology | Admitting: Radiation Oncology

## 2017-01-30 ENCOUNTER — Inpatient Hospital Stay (HOSPITAL_BASED_OUTPATIENT_CLINIC_OR_DEPARTMENT_OTHER): Payer: Medicare Other | Admitting: Oncology

## 2017-01-30 ENCOUNTER — Encounter: Payer: Self-pay | Admitting: Oncology

## 2017-01-30 ENCOUNTER — Encounter: Payer: Self-pay | Admitting: *Deleted

## 2017-01-30 ENCOUNTER — Inpatient Hospital Stay: Payer: Medicare Other

## 2017-01-30 VITALS — BP 118/78 | HR 102 | Temp 97.5°F | Resp 18 | Ht 71.0 in | Wt 206.0 lb

## 2017-01-30 DIAGNOSIS — C3411 Malignant neoplasm of upper lobe, right bronchus or lung: Secondary | ICD-10-CM

## 2017-01-30 DIAGNOSIS — C3401 Malignant neoplasm of right main bronchus: Secondary | ICD-10-CM | POA: Diagnosis not present

## 2017-01-30 DIAGNOSIS — Z79899 Other long term (current) drug therapy: Secondary | ICD-10-CM

## 2017-01-30 DIAGNOSIS — C770 Secondary and unspecified malignant neoplasm of lymph nodes of head, face and neck: Secondary | ICD-10-CM

## 2017-01-30 DIAGNOSIS — Z5111 Encounter for antineoplastic chemotherapy: Secondary | ICD-10-CM

## 2017-01-30 LAB — COMPREHENSIVE METABOLIC PANEL
ALT: 17 U/L (ref 17–63)
AST: 16 U/L (ref 15–41)
Albumin: 3.3 g/dL — ABNORMAL LOW (ref 3.5–5.0)
Alkaline Phosphatase: 108 U/L (ref 38–126)
Anion gap: 4 — ABNORMAL LOW (ref 5–15)
BILIRUBIN TOTAL: 0.5 mg/dL (ref 0.3–1.2)
BUN: 14 mg/dL (ref 6–20)
CHLORIDE: 110 mmol/L (ref 101–111)
CO2: 25 mmol/L (ref 22–32)
CREATININE: 1.15 mg/dL (ref 0.61–1.24)
Calcium: 9.5 mg/dL (ref 8.9–10.3)
GFR calc Af Amer: >60 mL/min (ref >60)
Glucose, Bld: 126 mg/dL — ABNORMAL HIGH (ref 65–99)
Potassium: 4.4 mmol/L (ref 3.5–5.1)
Sodium: 139 mmol/L (ref 135–145)
Total Protein: 7.6 g/dL (ref 6.5–8.1)

## 2017-01-30 LAB — CBC WITH DIFFERENTIAL/PLATELET
BASOS ABS: 0 10*3/uL (ref 0–0.1)
Basophils Relative: 1 %
Eosinophils Absolute: 0.1 10*3/uL (ref 0–0.7)
Eosinophils Relative: 4 %
HEMATOCRIT: 32.3 % — AB (ref 40.0–52.0)
Hemoglobin: 10.9 g/dL — ABNORMAL LOW (ref 13.0–18.0)
LYMPHS PCT: 14 %
Lymphs Abs: 0.5 10*3/uL — ABNORMAL LOW (ref 1.0–3.6)
MCH: 30.8 pg (ref 26.0–34.0)
MCHC: 33.7 g/dL (ref 32.0–36.0)
MCV: 91.3 fL (ref 80.0–100.0)
Monocytes Absolute: 0.7 10*3/uL (ref 0.2–1.0)
Monocytes Relative: 20 %
NEUTROS ABS: 2.2 10*3/uL (ref 1.4–6.5)
Neutrophils Relative %: 61 %
PLATELETS: 154 10*3/uL (ref 150–440)
RBC: 3.54 MIL/uL — AB (ref 4.40–5.90)
RDW: 19.1 % — ABNORMAL HIGH (ref 11.5–14.5)
WBC: 3.6 10*3/uL — AB (ref 3.8–10.6)

## 2017-01-30 MED ORDER — SODIUM CHLORIDE 0.9 % IV SOLN
45.0000 mg/m2 | Freq: Once | INTRAVENOUS | Status: AC
  Administered 2017-01-30: 96 mg via INTRAVENOUS
  Filled 2017-01-30: qty 16

## 2017-01-30 MED ORDER — SODIUM CHLORIDE 0.9 % IV SOLN
Freq: Once | INTRAVENOUS | Status: AC
  Administered 2017-01-30: 11:00:00 via INTRAVENOUS
  Filled 2017-01-30: qty 1000

## 2017-01-30 MED ORDER — SODIUM CHLORIDE 0.9 % IV SOLN
20.0000 mg | Freq: Once | INTRAVENOUS | Status: AC
  Administered 2017-01-30: 20 mg via INTRAVENOUS
  Filled 2017-01-30: qty 2

## 2017-01-30 MED ORDER — OXYCODONE HCL 10 MG PO TABS: 10.0000 mg | ORAL_TABLET | Freq: Four times a day (QID) | ORAL | 0 refills | Status: DC | PRN

## 2017-01-30 MED ORDER — DIPHENHYDRAMINE HCL 50 MG/ML IJ SOLN
50.0000 mg | Freq: Once | INTRAMUSCULAR | Status: AC
Start: 2017-01-30 — End: 2017-01-30
  Administered 2017-01-30: 50 mg via INTRAVENOUS
  Filled 2017-01-30: qty 1

## 2017-01-30 MED ORDER — HEPARIN SOD (PORK) LOCK FLUSH 100 UNIT/ML IV SOLN
500.0000 [IU] | Freq: Once | INTRAVENOUS | Status: AC | PRN
  Administered 2017-01-30: 500 [IU]
  Filled 2017-01-30: qty 5

## 2017-01-30 MED ORDER — CARBOPLATIN CHEMO INJECTION 450 MG/45ML
200.0000 mg | Freq: Once | INTRAVENOUS | Status: AC
  Administered 2017-01-30: 200 mg via INTRAVENOUS
  Filled 2017-01-30: qty 20

## 2017-01-30 MED ORDER — PALONOSETRON HCL INJECTION 0.25 MG/5ML
0.2500 mg | Freq: Once | INTRAVENOUS | Status: AC
Start: 2017-01-30 — End: 2017-01-30
  Administered 2017-01-30: 0.25 mg via INTRAVENOUS
  Filled 2017-01-30: qty 5

## 2017-01-30 MED ORDER — SODIUM CHLORIDE 0.9% FLUSH
10.0000 mL | INTRAVENOUS | Status: AC | PRN
  Administered 2017-01-30: 10 mL via INTRAVENOUS
  Filled 2017-01-30: qty 10

## 2017-01-30 MED ORDER — FAMOTIDINE IN NACL 20-0.9 MG/50ML-% IV SOLN
20.0000 mg | Freq: Once | INTRAVENOUS | Status: AC
  Administered 2017-01-30: 20 mg via INTRAVENOUS
  Filled 2017-01-30: qty 50

## 2017-01-30 NOTE — Progress Notes (Signed)
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 Patient is a 76 year old African-American male with a past medical history significant for COPD for which she sees Dr. Humphrey Rolls. He was recently admitted to the hospital in March 2018 with symptoms of pneumonia. CT thorax at that time showed: Perihilar lung mass with associated postobstructive atelectasis and consolidation of the right upper lobe  2. This was followed by a PET CT scan on 10/10/2016 which showed:1. Hypermetabolic RIGHT hilar mass with postobstructive collapse of the RIGHT upper lobe consistent with primary or metastatic bronchogenic carcinoma. 2. Hypermetabolic RIGHT paratracheal, prevascular and RIGHT supraclavicular nodal metastasis. 3. RIGHT supraclavicular node is intensely metabolic but likely too small for biopsy. 4. RIGHT lower lobe peripheral nodular thickening with associated metabolic activity is likely inflammatory. 5. Severe bullous change in the LEFT hemithorax  3. CT abdomen from 10/10/2016 showed no evidence of metastatic disease.  4. 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 Patient has problems with bilateral lower extremity swelling as well as shortness of breath on minimal exertion. He uses nighttime oxygen. He follows up with Dr. Floyce Stakes pulmonary as well as cardiology  6. Concurrent chemo/RT started on 11/13/16. Patient completed 5 weekly cycles of  chemo/RT. Plan per Rad onc is to give him radiation boost for 3 weeks starting 01/15/17  Interval history- he has not been taking his lasix. Fluid in his legs has reaccumulated  ECOG PS- 1 Pain scale- 0 Opioid associated constipation- no  Review of systems- Review of Systems  Constitutional: Negative for chills, fever, malaise/fatigue and weight loss.  HENT: Negative for congestion, ear discharge and nosebleeds.   Eyes: Negative for blurred vision.  Respiratory: Negative for cough, hemoptysis, sputum production, shortness of breath and wheezing.   Cardiovascular: Negative for chest pain, palpitations, orthopnea and claudication.  Gastrointestinal: Negative for abdominal pain, blood in stool, constipation, diarrhea, heartburn, melena, nausea and vomiting.  Genitourinary: Negative for dysuria, flank pain, frequency, hematuria and urgency.  Musculoskeletal: Negative for back pain, joint pain and myalgias.  Skin: Negative for rash.  Neurological: Negative for dizziness, tingling, focal weakness, seizures, weakness and headaches.  Endo/Heme/Allergies: Does not bruise/bleed easily.  Psychiatric/Behavioral: Negative for depression and suicidal ideas. The patient does not have insomnia.      No Known Allergies   Past Medical History:  Diagnosis Date  . Cancer ([place])    Lung CA  . CHF (congestive heart failure) ([place])   . COPD (chronic obstructive pulmonary disease) ([place])   . Coronary artery disease   . Diabetes mellitus without complication ([place])   . Fibromyalgia   . GERD (gastroesophageal reflux disease)   . Hypertension      Past Surgical History:  Procedure Laterality Date  . COLONOSCOPY WITH PROPOFOL N/A 01/18/2015   Procedure: COLONOSCOPY WITH PROPOFOL;  Surgeon: Manya Silvas, MD;  Location: [facility] ENDOSCOPY;  Service: Endoscopy;  Laterality: N/A;  . ESOPHAGOGASTRODUODENOSCOPY N/A 01/18/2015   Procedure: ESOPHAGOGASTRODUODENOSCOPY (EGD);  Surgeon: Manya Silvas, MD;  Location:  [facility] ENDOSCOPY;  Service: Endoscopy;  Laterality: N/A;  . EYE SURGERY    . FLEXIBLE BRONCHOSCOPY N/A 10/22/2016   Procedure: FLEXIBLE BRONCHOSCOPY;  Surgeon: Allyne Gee, MD;  Location: [facility] ORS;  Service: Pulmonary;  Laterality: N/A;  . HERNIA REPAIR    . IR FLUORO GUIDE PORT INSERTION LEFT  11/07/2016  . REPAIR KNEE LIGAMENT    . TONSILLECTOMY      Social History   Social History  . Marital status: Married    Spouse name: N/A  . Number of children: N/A  . Years of education: N/A   Occupational History  . Not on file.   Social History Main Topics  . Smoking status: Former Smoker    Quit date: 12/01/2003  . Smokeless tobacco: Never Used  . Alcohol use 1.2 oz/week    2 Shots of liquor per week  . Drug use: No  . Sexual activity: Not on file   Other Topics Concern  . Not on file   Social History Narrative  . No narrative on file    Family History  Problem Relation Age of Onset  . Prostate cancer Father   . Prostate cancer Brother   . Diabetes Unknown        all 13 siblings  . Hypertension Unknown        all 13 siblings     Current Outpatient Prescriptions:  .  acetaminophen (TYLENOL) 325 MG tablet, Take 2 tablets (650 mg total) by mouth every 6 (six) hours as needed for mild pain (or Fever >/= 101). (Patient not taking: Reported on 12/04/2016), Disp: , Rfl:  .  albuterol-ipratropium (COMBIVENT) 18-103 MCG/ACT inhaler, Inhale 2 puffs into the lungs every 6 (six) hours as needed for wheezing or shortness of breath., Disp: , Rfl:  .  aspirin 325 MG tablet, Take 325 mg by mouth daily., Disp: , Rfl:  .  benzonatate (TESSALON) 200 MG capsule, Take 1 capsule (200 mg total) by mouth 3 (three) times daily as needed for cough. (Patient not taking: Reported on 01/01/2017), Disp: 20 capsule, Rfl: 0 .  carvedilol (COREG) 25 MG tablet, Take 25 mg by mouth 2 (two) times daily with a meal., Disp: , Rfl:  .  dexamethasone (DECADRON) 4 MG tablet, Take 2 tablets (8 mg total) by mouth  daily. Start the day after chemotherapy for 2 days. (Patient not taking: Reported on 01/01/2017), Disp: 30 tablet, Rfl: 1 .  Fluticasone Furoate-Vilanterol 100-25 MCG/INH AEPB, Inhale 1 puff into the lungs daily. , Disp: , Rfl:  .  furosemide (LASIX) 40 MG tablet, Take 40 mg by mouth daily., Disp: , Rfl:  .  lidocaine-prilocaine (EMLA) cream, Apply to affected area once, Disp: 30 g, Rfl: 3 .  LORazepam (ATIVAN) 0.5 MG tablet, Take 1 tablet (0.5 mg total) by mouth every 6 (six) hours as needed (Nausea or vomiting). (Patient not taking: Reported on 01/01/2017), Disp: 30 tablet, Rfl: 0 .  losartan (COZAAR) 100 MG tablet, Take 100 mg by mouth daily., Disp: , Rfl:  .  metFORMIN (GLUCOPHAGE) 500 MG tablet, Take 500 mg by mouth 2 (two) times daily. , Disp: , Rfl:  .  omeprazole (PRILOSEC) 40 MG capsule, Take 40 mg by mouth daily., Disp: , Rfl:  .  ondansetron (ZOFRAN) 8 MG tablet, Take 1 tablet (8 mg total) by mouth 2 (two) times daily as needed for refractory nausea / vomiting. Start on day 3 after chemo. (Patient not taking: Reported on 12/04/2016), Disp: 30  tablet, Rfl: 1 .  Oxycodone HCl 10 MG TABS, Take 1 tablet (10 mg total) by mouth every 6 (six) hours as needed., Disp: 30 tablet, Rfl: 0 .  prochlorperazine (COMPAZINE) 10 MG tablet, Take 1 tablet (10 mg total) by mouth every 6 (six) hours as needed (Nausea or vomiting). (Patient not taking: Reported on 12/04/2016), Disp: 30 tablet, Rfl: 1 .  simvastatin (ZOCOR) 10 MG tablet, Take 10 mg by mouth every evening. , Disp: , Rfl:  .  sitaGLIPtin (JANUVIA) 50 MG tablet, Take 50 mg by mouth daily., Disp: , Rfl:  .  sucralfate (CARAFATE) 1 g tablet, Take 1 tablet (1 g total) by mouth 3 (three) times daily. Dissolve in 2-3 tbsp warm water, swish and swallow. (Patient not taking: Reported on 12/24/2016), Disp: 90 tablet, Rfl: 3 No current facility-administered medications for this visit.   Facility-Administered Medications Ordered in Other Visits:  .  sodium  chloride flush (NS) 0.9 % injection 10 mL, 10 mL, Intravenous, PRN, Sindy Guadeloupe, MD  Physical exam:  Vitals:   01/30/17 1036  BP: 118/78  Pulse: (!) 102  Resp: 18  Temp: (!) 97.5 F (36.4 C)  TempSrc: Tympanic  Weight: 206 lb (93.4 kg)  Height: _0  (1.803 m)   Physical Exam  Constitutional: He is oriented to person, place, and time and well-developed, well-nourished, and in no distress.  HENT:  Head: Normocephalic and atraumatic.  Eyes: Pupils are equal, round, and reactive to light. EOM are normal.  Neck: Normal range of motion.  Cardiovascular: Normal rate, regular rhythm and normal heart sounds.   Pulmonary/Chest: Effort normal and breath sounds normal.  Abdominal: Soft. Bowel sounds are normal.  Neurological: He is alert and oriented to person, place, and time.  Skin: Skin is warm and dry.     CMP Latest Ref Rng & Units 01/30/2017  Glucose 65 - 99 mg/dL 126(H)  BUN 6 - 20 mg/dL 14  Creatinine 0.61 - 1.24 mg/dL 1.15  Sodium 135 - 145 mmol/L 139  Potassium 3.5 - 5.1 mmol/L 4.4  Chloride 101 - 111 mmol/L 110  CO2 22 - 32 mmol/L 25  Calcium 8.9 - 10.3 mg/dL 9.5  Total Protein 6.5 - 8.1 g/dL 7.6  Total Bilirubin 0.3 - 1.2 mg/dL 0.5  Alkaline Phos 38 - 126 U/L 108  AST 15 - 41 U/L 16  ALT 17 - 63 U/L 17   CBC Latest Ref Rng & Units 01/30/2017  WBC 3.8 - 10.6 K/uL 3.6(L)  Hemoglobin 13.0 - 18.0 g/dL 10.9(L)  Hematocrit 40.0 - 52.0 % 32.3(L)  Platelets 150 - 440 K/uL 154    No images are attached to the encounter.  Ct Chest W Contrast  Result Date: 01/01/2017 CLINICAL DATA:  Followup lung cancer EXAM: CT CHEST, ABDOMEN, AND PELVIS WITH CONTRAST TECHNIQUE: Multidetector CT imaging of the chest, abdomen and pelvis was performed following the standard protocol during bolus administration of intravenous contrast. CONTRAST:  1109m ISOVUE-300 IOPAMIDOL (ISOVUE-300) INJECTION 61% COMPARISON:  CT 09/26/16 and PET-CT 10/10/2016 FINDINGS: CT CHEST FINDINGS Cardiovascular:  The heart size appears normal. Aortic atherosclerosis. Calcifications within the RCA, LAD coronary artery is identified. No pericardial effusion. Mediastinum/Nodes: The trachea appears patent and is midline. Normal appearance of the esophagus. Index right paratracheal node measures 7 mm, image 28 of series 2. Decreased from 2.1 cm previously. Index pre-vascular lymph node within the anterior mediastinum measures 5 mm, image 16 of series 2. Previously 0.9 cm. Left paratracheal node Measures 5 mm,  image 17 of series 2. Previously 9 mm. Index right hilar nodal mass measures 2.5 x 1.3 cm, image 31 of series 2. Previously 2.8 x 2.5 cm. Lungs/Pleura: Advanced changes of bullous emphysema. The right upper lobe mass measures 3.1 x 2.3 cm, image 30 of series 2. Previously 5 x 4.0 cm. Stable chronic appearing subsegmental atelectasis in the lingula. No pleural fluid. Musculoskeletal: No chest wall mass or suspicious bone lesions identified. CT ABDOMEN PELVIS FINDINGS Hepatobiliary: No focal liver abnormality is seen. No gallstones, gallbladder wall thickening, or biliary dilatation. Pancreas: Unremarkable. No pancreatic ductal dilatation or surrounding inflammatory changes. Spleen: Normal in size without focal abnormality. Adrenals/Urinary Tract: The adrenal glands appear normal. Bilateral kidney cysts. No hydronephrosis or hydroureter. The urinary bladder appears within normal limits. Stomach/Bowel: The stomach and the small bowel loops appear unremarkable. No pathologic dilatation of the colon. Vascular/Lymphatic: Aortic atherosclerosis noted. No aneurysm. No upper abdominal or pelvic adenopathy. No inguinal adenopathy. Reproductive: Prostate is unremarkable. Other: No abdominal wall hernia or abnormality. No abdominopelvic ascites. Musculoskeletal: No aggressive lytic or sclerotic bone lesions. 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 IMPRESSION: 1. Interval response to therapy. 2. Decrease in size  of mediastinal and right hilar adenopathy. The dominant mass within the right upper lobe has also decreased in size in the interval. 3. Aortic Atherosclerosis (ICD10-I70.0) and Emphysema (ICD10-J43.9). Multi vessel coronary artery calcifications noted. Electronically Signed   By: Kerby Moors M.D.   On: 01/01/2017 09:10   Ct Abdomen Pelvis W Contrast  Result Date: 01/01/2017 CLINICAL DATA:  Followup lung cancer EXAM: CT CHEST, ABDOMEN, AND PELVIS WITH CONTRAST TECHNIQUE: Multidetector CT imaging of the chest, abdomen and pelvis was performed following the standard protocol during bolus administration of intravenous contrast. CONTRAST:  176m ISOVUE-300 IOPAMIDOL (ISOVUE-300) INJECTION 61% COMPARISON:  CT 09/26/16 and PET-CT 10/10/2016 FINDINGS: CT CHEST FINDINGS Cardiovascular: The heart size appears normal. Aortic atherosclerosis. Calcifications within the RCA, LAD coronary artery is identified. No pericardial effusion. Mediastinum/Nodes: The trachea appears patent and is midline. Normal appearance of the esophagus. Index right paratracheal node measures 7 mm, image 28 of series 2. Decreased from 2.1 cm previously. Index pre-vascular lymph node within the anterior mediastinum measures 5 mm, image 16 of series 2. Previously 0.9 cm. Left paratracheal node Measures 5 mm, image 17 of series 2. Previously 9 mm. Index right hilar nodal mass measures 2.5 x 1.3 cm, image 31 of series 2. Previously 2.8 x 2.5 cm. Lungs/Pleura: Advanced changes of bullous emphysema. The right upper lobe mass measures 3.1 x 2.3 cm, image 30 of series 2. Previously 5 x 4.0 cm. Stable chronic appearing subsegmental atelectasis in the lingula. No pleural fluid. Musculoskeletal: No chest wall mass or suspicious bone lesions identified. CT ABDOMEN PELVIS FINDINGS Hepatobiliary: No focal liver abnormality is seen. No gallstones, gallbladder wall thickening, or biliary dilatation. Pancreas: Unremarkable. No pancreatic ductal dilatation or  surrounding inflammatory changes. Spleen: Normal in size without focal abnormality. Adrenals/Urinary Tract: The adrenal glands appear normal. Bilateral kidney cysts. No hydronephrosis or hydroureter. The urinary bladder appears within normal limits. Stomach/Bowel: The stomach and the small bowel loops appear unremarkable. No pathologic dilatation of the colon. Vascular/Lymphatic: Aortic atherosclerosis noted. No aneurysm. No upper abdominal or pelvic adenopathy. No inguinal adenopathy. Reproductive: Prostate is unremarkable. Other: No abdominal wall hernia or abnormality. No abdominopelvic ascites. Musculoskeletal: No aggressive lytic or sclerotic bone lesions. 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 IMPRESSION: 1. Interval response to therapy. 2. Decrease  in size of mediastinal and right hilar adenopathy. The dominant mass within the right upper lobe has also decreased in size in the interval. 3. Aortic Atherosclerosis (ICD10-I70.0) and Emphysema (ICD10-J43.9). Multi vessel coronary artery calcifications noted. Electronically Signed   By: Kerby Moors M.D.   On: 01/01/2017 09:10     Assessment and plan- Patient is a 76 y.o. male Stage IIIB T1N3M0 s/p 7 weekly cycles of chemo/RT  Patient will get his last weekly dose of carboplatin and Taxol today he finishes radiation next week. Given that he has stage III lung cancer and has had a good partial response to this treatment after his inherent scans, I would recommend maintenance to evaluate him at 10 mg/kg IV every 2 weeks for up to 12 months. Durvalumab for stage III lung cancer has shown to survival but there is no data yet on overall survival. I also gave him the left preferred option of consolidation chemotherapy with 2 cycles of carbotaxol if he does not wish to go through one year of durvalumab.  Initially patient was hesitant but later agrees to proceed with phase II of treatment with maintenance durvalumab.  I discussed risks  and benefits of durvalumab including all but not limited to autoimmune colitis, pneumonitis, need to monitor liver kidney and thyroid functions. Patient understands and agrees to proceed as planned. I will see him back in 2 weeks' time with, CMP and TSH to begin the first cycle of durvalumab.    Visit Diagnosis 1. Malignant neoplasm of upper lobe of right lung ([place])   2. Encounter for antineoplastic chemotherapy      Dr. Randa Evens, MD, MPH [facility] Pager- [phone] 01/30/2017 11:12 AM

## 2017-01-30 NOTE — Progress Notes (Signed)
Pt has some coughing. Some trouble sometimes with pain throat from swallowing none  today

## 2017-02-02 ENCOUNTER — Ambulatory Visit
Admission: RE | Admit: 2017-02-02 | Discharge: 2017-02-02 | Disposition: A | Discharge status: Home / Self Care | Payer: Medicare Other | Source: Ambulatory Visit | Attending: Radiation Oncology | Admitting: Radiation Oncology

## 2017-02-02 DIAGNOSIS — C3401 Malignant neoplasm of right main bronchus: Secondary | ICD-10-CM | POA: Diagnosis not present

## 2017-02-03 ENCOUNTER — Other Ambulatory Visit: Payer: Self-pay | Admitting: Oncology

## 2017-02-03 NOTE — Progress Notes (Signed)
DISCONTINUE ON PATHWAY REGIMEN - Non-Small Cell Lung     Administer weekly:     Paclitaxel      Carboplatin   **Always confirm dose/schedule in your pharmacy ordering system**    REASON: Continuation Of Treatment PRIOR TREATMENT: [id number]: Carboplatin AUC=2 + Paclitaxel 45 mg/m2 Weekly During Radiation TREATMENT RESPONSE: Partial Response (PR)  START ON PATHWAY REGIMEN - Non-Small Cell Lung     A cycle is every 14 days:     Durvalumab   **Always confirm dose/schedule in your pharmacy ordering system**    Patient Characteristics: Stage III - Unresectable, PS = 0, 1 AJCC T Category: TX Current Disease Status: No Distant Mets or Local Recurrence AJCC N Category: NX AJCC M Category: M0 AJCC 8 Stage Grouping: IIIB Performance Status: PS = 0, 1 Intent of Therapy: Curative Intent, Discussed with Patient

## 2017-02-09 ENCOUNTER — Other Ambulatory Visit: Payer: Self-pay | Admitting: Oncology

## 2017-02-09 DIAGNOSIS — C3411 Malignant neoplasm of upper lobe, right bronchus or lung: Secondary | ICD-10-CM

## 2017-02-09 MED ORDER — LIDOCAINE-PRILOCAINE 2.5-2.5 % EX CREA: TOPICAL_CREAM | CUTANEOUS | 3 refills | Status: DC

## 2017-02-09 NOTE — Progress Notes (Addendum)
ON PATHWAY REGIMEN - Non-Small Cell Lung  No Change  Continue With Treatment as Ordered.     A cycle is every 14 days:     Durvalumab   **Always confirm dose/schedule in your pharmacy ordering system**    Patient Characteristics: Stage III - Unresectable, PS = 0, 1 AJCC T Category: TX Current Disease Status: No Distant Mets or Local Recurrence AJCC N Category: NX AJCC M Category: M0 AJCC 8 Stage Grouping: IIIB Performance Status: PS = 0, 1 Intent of Therapy: Curative Intent, Discussed with Patient

## 2017-02-13 ENCOUNTER — Inpatient Hospital Stay: Payer: Medicare Other

## 2017-02-13 ENCOUNTER — Encounter: Payer: Self-pay | Admitting: Oncology

## 2017-02-13 ENCOUNTER — Inpatient Hospital Stay: Payer: Medicare Other | Attending: Oncology | Admitting: Oncology

## 2017-02-13 VITALS — BP 119/76 | HR 112 | Temp 98.5°F | Resp 20 | Wt 206.1 lb

## 2017-02-13 DIAGNOSIS — I2510 Atherosclerotic heart disease of native coronary artery without angina pectoris: Secondary | ICD-10-CM | POA: Diagnosis not present

## 2017-02-13 DIAGNOSIS — Z923 Personal history of irradiation: Secondary | ICD-10-CM | POA: Insufficient documentation

## 2017-02-13 DIAGNOSIS — Z9221 Personal history of antineoplastic chemotherapy: Secondary | ICD-10-CM | POA: Insufficient documentation

## 2017-02-13 DIAGNOSIS — M797 Fibromyalgia: Secondary | ICD-10-CM | POA: Insufficient documentation

## 2017-02-13 DIAGNOSIS — G893 Neoplasm related pain (acute) (chronic): Secondary | ICD-10-CM | POA: Diagnosis not present

## 2017-02-13 DIAGNOSIS — M7989 Other specified soft tissue disorders: Secondary | ICD-10-CM | POA: Diagnosis not present

## 2017-02-13 DIAGNOSIS — Z5111 Encounter for antineoplastic chemotherapy: Secondary | ICD-10-CM | POA: Diagnosis present

## 2017-02-13 DIAGNOSIS — Z7982 Long term (current) use of aspirin: Secondary | ICD-10-CM | POA: Diagnosis not present

## 2017-02-13 DIAGNOSIS — C770 Secondary and unspecified malignant neoplasm of lymph nodes of head, face and neck: Secondary | ICD-10-CM | POA: Insufficient documentation

## 2017-02-13 DIAGNOSIS — Z7901 Long term (current) use of anticoagulants: Secondary | ICD-10-CM | POA: Diagnosis not present

## 2017-02-13 DIAGNOSIS — Z7984 Long term (current) use of oral hypoglycemic drugs: Secondary | ICD-10-CM | POA: Diagnosis not present

## 2017-02-13 DIAGNOSIS — I509 Heart failure, unspecified: Secondary | ICD-10-CM | POA: Insufficient documentation

## 2017-02-13 DIAGNOSIS — J449 Chronic obstructive pulmonary disease, unspecified: Secondary | ICD-10-CM | POA: Diagnosis not present

## 2017-02-13 DIAGNOSIS — I110 Hypertensive heart disease with heart failure: Secondary | ICD-10-CM | POA: Insufficient documentation

## 2017-02-13 DIAGNOSIS — C3411 Malignant neoplasm of upper lobe, right bronchus or lung: Secondary | ICD-10-CM | POA: Insufficient documentation

## 2017-02-13 DIAGNOSIS — K219 Gastro-esophageal reflux disease without esophagitis: Secondary | ICD-10-CM | POA: Insufficient documentation

## 2017-02-13 DIAGNOSIS — Z87891 Personal history of nicotine dependence: Secondary | ICD-10-CM | POA: Insufficient documentation

## 2017-02-13 DIAGNOSIS — E119 Type 2 diabetes mellitus without complications: Secondary | ICD-10-CM | POA: Diagnosis not present

## 2017-02-13 DIAGNOSIS — Z5112 Encounter for antineoplastic immunotherapy: Secondary | ICD-10-CM

## 2017-02-13 DIAGNOSIS — I2699 Other pulmonary embolism without acute cor pulmonale: Secondary | ICD-10-CM | POA: Insufficient documentation

## 2017-02-13 DIAGNOSIS — Z79899 Other long term (current) drug therapy: Secondary | ICD-10-CM | POA: Insufficient documentation

## 2017-02-13 LAB — COMPREHENSIVE METABOLIC PANEL
ALK PHOS: 109 U/L (ref 38–126)
ALT: 19 U/L (ref 17–63)
ANION GAP: 7 (ref 5–15)
AST: 19 U/L (ref 15–41)
Albumin: 3.4 g/dL — ABNORMAL LOW (ref 3.5–5.0)
BILIRUBIN TOTAL: 0.6 mg/dL (ref 0.3–1.2)
BUN: 16 mg/dL (ref 6–20)
CALCIUM: 9.6 mg/dL (ref 8.9–10.3)
CO2: 24 mmol/L (ref 22–32)
Chloride: 111 mmol/L (ref 101–111)
Creatinine, Ser: 1.24 mg/dL (ref 0.61–1.24)
GFR calc Af Amer: >60 mL/min (ref >60)
GFR, EST NON AFRICAN AMERICAN: 55 mL/min — AB (ref >60)
Glucose, Bld: 175 mg/dL — ABNORMAL HIGH (ref 65–99)
POTASSIUM: 4.3 mmol/L (ref 3.5–5.1)
Sodium: 142 mmol/L (ref 135–145)
TOTAL PROTEIN: 8 g/dL (ref 6.5–8.1)

## 2017-02-13 LAB — CBC WITH DIFFERENTIAL/PLATELET
Basophils Absolute: 0 10*3/uL (ref 0–0.1)
Basophils Relative: 0 %
Eosinophils Absolute: 0.1 10*3/uL (ref 0–0.7)
Eosinophils Relative: 2 %
HEMATOCRIT: 35.4 % — AB (ref 40.0–52.0)
HEMOGLOBIN: 11.7 g/dL — AB (ref 13.0–18.0)
LYMPHS PCT: 14 %
Lymphs Abs: 0.5 10*3/uL — ABNORMAL LOW (ref 1.0–3.6)
MCH: 30.6 pg (ref 26.0–34.0)
MCHC: 32.9 g/dL (ref 32.0–36.0)
MCV: 92.7 fL (ref 80.0–100.0)
MONO ABS: 0.9 10*3/uL (ref 0.2–1.0)
MONOS PCT: 24 %
NEUTROS ABS: 2.3 10*3/uL (ref 1.4–6.5)
Neutrophils Relative %: 60 %
Platelets: 111 10*3/uL — ABNORMAL LOW (ref 150–440)
RBC: 3.82 MIL/uL — ABNORMAL LOW (ref 4.40–5.90)
RDW: 18.5 % — AB (ref 11.5–14.5)
WBC: 3.9 10*3/uL (ref 3.8–10.6)

## 2017-02-13 LAB — TSH: TSH: 1.271 u[IU]/mL (ref 0.350–4.500)

## 2017-02-13 MED ORDER — OXYCODONE HCL 10 MG PO TABS: 10.0000 mg | ORAL_TABLET | Freq: Four times a day (QID) | ORAL | 0 refills | Status: DC | PRN

## 2017-02-13 MED ORDER — HEPARIN SOD (PORK) LOCK FLUSH 100 UNIT/ML IV SOLN
500.0000 [IU] | Freq: Once | INTRAVENOUS | Status: AC | PRN
  Administered 2017-02-13: 500 [IU]
  Filled 2017-02-13: qty 5

## 2017-02-13 MED ORDER — SODIUM CHLORIDE 0.9% FLUSH
10.0000 mL | INTRAVENOUS | Status: DC | PRN
  Filled 2017-02-13: qty 10

## 2017-02-13 MED ORDER — SODIUM CHLORIDE 0.9 % IV SOLN
Freq: Once | INTRAVENOUS | Status: AC
  Administered 2017-02-13: 10:00:00 via INTRAVENOUS
  Filled 2017-02-13: qty 1000

## 2017-02-13 MED ORDER — SODIUM CHLORIDE 0.9 % IV SOLN
980.0000 mg | Freq: Once | INTRAVENOUS | Status: AC
  Administered 2017-02-13: 980 mg via INTRAVENOUS
  Filled 2017-02-13: qty 9.6

## 2017-02-13 NOTE — Progress Notes (Signed)
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 Patient is a 76 year old African-American male with a past medical history significant for COPD for which she sees Dr. Humphrey Rolls. He was recently admitted to the hospital in March 2018 with symptoms of pneumonia. CT thorax at that time showed: Perihilar lung mass with associated postobstructive atelectasis and consolidation of the right upper lobe  2. This was followed by a PET CT scan on 10/10/2016 which showed:1. Hypermetabolic RIGHT hilar mass with postobstructive collapse of the RIGHT upper lobe consistent with primary or metastatic bronchogenic carcinoma. 2. Hypermetabolic RIGHT paratracheal, prevascular and RIGHT supraclavicular nodal metastasis. 3. RIGHT supraclavicular node is intensely metabolic but likely too small for biopsy. 4. RIGHT lower lobe peripheral nodular thickening with associated metabolic activity is likely inflammatory. 5. Severe bullous change in the LEFT hemithorax  3. CT abdomen from 10/10/2016 showed no evidence of metastatic disease.  4. 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 Patient has problems with bilateral lower extremity swelling as well as shortness of breath on minimal exertion. He uses nighttime oxygen. He follows up with Dr. Floyce Stakes pulmonary as well as cardiology  6. Concurrent chemo/RT started on 11/13/16. Patient completed 5 weekly cycles of chemo/RT. He then had a gap of 3  weeks and restarted radiation boost for 3 weeks with concurrent chemo/RT. Interim scans after 5 weekly cycles showed partial response to treatment  Interval history- has occasional throat paina nd globus sensation for which oxycodone is helping. Leg swelling persists and is stable  ECOG PS- 1 Pain scale- 0 Opioid associated constipation- no  Review of systems- Review of Systems  Constitutional: Negative for chills, fever, malaise/fatigue and weight loss.  HENT: Negative for congestion, ear discharge and nosebleeds.   Eyes: Negative for blurred vision.  Respiratory: Negative for cough, hemoptysis, sputum production, shortness of breath and wheezing.   Cardiovascular: Negative for chest pain, palpitations, orthopnea and claudication.  Gastrointestinal: Negative for abdominal pain, blood in stool, constipation, diarrhea, heartburn, melena, nausea and vomiting.  Genitourinary: Negative for dysuria, flank pain, frequency, hematuria and urgency.  Musculoskeletal: Negative for back pain, joint pain and myalgias.  Skin: Negative for rash.  Neurological: Negative for dizziness, tingling, focal weakness, seizures, weakness and headaches.  Endo/Heme/Allergies: Does not bruise/bleed easily.  Psychiatric/Behavioral: Negative for depression and suicidal ideas. The patient does not have insomnia.       No Known Allergies   Past Medical History:  Diagnosis Date  . Cancer ([place])    Lung CA  . CHF (congestive heart failure) ([place])   . COPD (chronic obstructive pulmonary disease) ([place])   . Coronary artery disease   . Diabetes mellitus without complication ([place])   . Fibromyalgia   . GERD (gastroesophageal reflux disease)   . Hypertension      Past Surgical History:  Procedure Laterality Date  . COLONOSCOPY WITH PROPOFOL N/A 01/18/2015   Procedure: COLONOSCOPY WITH PROPOFOL;  Surgeon: Manya Silvas, MD;  Location: [facility] ENDOSCOPY;  Service: Endoscopy;  Laterality: N/A;  .  ESOPHAGOGASTRODUODENOSCOPY  N/A 01/18/2015   Procedure: ESOPHAGOGASTRODUODENOSCOPY (EGD);  Surgeon: Manya Silvas, MD;  Location[facility] ENDOSCOPY;  Service: Endoscopy;  Laterality: N/A;  . EYE SURGERY    . FLEXIBLE BRONCHOSCOPY N/A 10/22/2016   Procedure: FLEXIBLE BRONCHOSCOPY;  Surgeon: Allyne Gee, MD;  Location: [facility] ORS;  Service: Pulmonary;  Laterality: N/A;  . HERNIA REPAIR    . IR FLUORO GUIDE PORT INSERTION LEFT  11/07/2016  . REPAIR KNEE LIGAMENT    . TONSILLECTOMY      Social History   Social History  . Marital status: Married    Spouse name: N/A  . Number of children: N/A  . Years of education: N/A   Occupational History  . Not on file.   Social History Main Topics  . Smoking status: Former Smoker    Quit date: 12/01/2003  . Smokeless tobacco: Never Used  . Alcohol use 1.2 oz/week    2 Shots of liquor per week     Comment: on the weeknd 2 shots  . Drug use: No  . Sexual activity: Not on file   Other Topics Concern  . Not on file   Social History Narrative  . No narrative on file    Family History  Problem Relation Age of Onset  . Prostate cancer Father   . Prostate cancer Brother   . Diabetes Unknown        all 13 siblings  . Hypertension Unknown        all 13 siblings     Current Outpatient Prescriptions:  .  acetaminophen (TYLENOL) 325 MG tablet, Take 2 tablets (650 mg total) by mouth every 6 (six) hours as needed for mild pain (or Fever >/= 101). (Patient not taking: Reported on 12/04/2016), Disp: , Rfl:  .  albuterol-ipratropium (COMBIVENT) 18-103 MCG/ACT inhaler, Inhale 2 puffs into the lungs every 6 (six) hours as needed for wheezing or shortness of breath., Disp: , Rfl:  .  aspirin 325 MG tablet, Take 325 mg by mouth daily., Disp: , Rfl:  .  benzonatate (TESSALON) 200 MG capsule, Take 1 capsule (200 mg total) by mouth 3 (three) times daily as needed for cough. (Patient not taking: Reported on 01/01/2017), Disp: 20 capsule, Rfl: 0 .  carvedilol  (COREG) 25 MG tablet, Take 25 mg by mouth 2 (two) times daily with a meal., Disp: , Rfl:  .  Fluticasone Furoate-Vilanterol 100-25 MCG/INH AEPB, Inhale 1 puff into the lungs daily. , Disp: , Rfl:  .  furosemide (LASIX) 40 MG tablet, Take 40 mg by mouth daily., Disp: , Rfl:  .  lidocaine-prilocaine (EMLA) cream, Apply to affected area once, Disp: 30 g, Rfl: 3 .  losartan (COZAAR) 100 MG tablet, Take 100 mg by mouth daily., Disp: , Rfl:  .  metFORMIN (GLUCOPHAGE) 500 MG tablet, Take 500 mg by mouth 2 (two) times daily. , Disp: , Rfl:  .  omeprazole (PRILOSEC) 40 MG capsule, Take 40 mg by mouth daily., Disp: , Rfl:  .  Oxycodone HCl 10 MG TABS, Take 1 tablet (10 mg total) by mouth every 6 (six) hours as needed., Disp: 30 tablet, Rfl: 0 .  simvastatin (ZOCOR) 10 MG tablet, Take 10 mg by mouth every evening. , Disp: , Rfl:  .  sitaGLIPtin (JANUVIA) 50 MG tablet, Take 50 mg by mouth daily., Disp: , Rfl:  .  sucralfate (CARAFATE) 1 g tablet, Take 1 tablet (1 g total) by mouth 3 (three) times daily. Dissolve in 2-3 tbsp warm water,  swish and swallow. (Patient not taking: Reported on 12/24/2016), Disp: 90 tablet, Rfl: 3 No current facility-administered medications for this visit.   Facility-Administered Medications Ordered in Other Visits:  .  sodium chloride flush (NS) 0.9 % injection 10 mL, 10 mL, Intravenous, PRN, Sindy Guadeloupe, MD, 10 mL at 01/30/17 0958  Physical exam:  Vitals:   02/13/17 0919  BP: 119/76  Pulse: (!) 112  Resp: 20  Temp: 98.5 F (36.9 C)  TempSrc: Tympanic  Weight: 206 lb 1 oz (93.5 kg)   Physical Exam  Constitutional: He is oriented to person, place, and time and well-developed, well-nourished, and in no distress.  HENT:  Head: Normocephalic and atraumatic.  Eyes: Pupils are equal, round, and reactive to light. EOM are normal.  Neck: Normal range of motion.  Cardiovascular: Normal rate, regular rhythm and normal heart sounds.   Pulmonary/Chest: Effort normal and breath  sounds normal.  Abdominal: Soft. Bowel sounds are normal.  Musculoskeletal: He exhibits edema (b/l +1).  Neurological: He is alert and oriented to person, place, and time.  Skin: Skin is warm and dry.     CMP Latest Ref Rng & Units 02/13/2017  Glucose 65 - 99 mg/dL 175(H)  BUN 6 - 20 mg/dL 16  Creatinine 0.61 - 1.24 mg/dL 1.24  Sodium 135 - 145 mmol/L 142  Potassium 3.5 - 5.1 mmol/L 4.3  Chloride 101 - 111 mmol/L 111  CO2 22 - 32 mmol/L 24  Calcium 8.9 - 10.3 mg/dL 9.6  Total Protein 6.5 - 8.1 g/dL 8.0  Total Bilirubin 0.3 - 1.2 mg/dL 0.6  Alkaline Phos 38 - 126 U/L 109  AST 15 - 41 U/L 19  ALT 17 - 63 U/L 19   CBC Latest Ref Rng & Units 02/13/2017  WBC 3.8 - 10.6 K/uL 3.9  Hemoglobin 13.0 - 18.0 g/dL 11.7(L)  Hematocrit 40.0 - 52.0 % 35.4(L)  Platelets 150 - 440 K/uL 111(L)      Assessment and plan- Patient is a 76 y.o. male with SCC of RUL Stage IIIB T1N3M0. He has completed concurrent chemo/RT with partial response. He is here for on treatment assessment prior to cycle 1 of durvalumab  Counts ok to proceed with cycle 1 of durvalumab. rtc in 1 week with cbc, cmp for cycle 2 and will be seen by covering MD. Risks and benefits of durvalumab again dscussed today and patient agrees to proceed as planned. I will see him back in 4 weeks time with cbc, cmp for cycle 3 of durvalumab  Neoplasm related pain- refill for oxycodone given  Leg swelling- chronic. Being followed by cardiology and pulmonary   Visit Diagnosis 1. Malignant neoplasm of upper lobe of right lung ([place])   2. Encounter for antineoplastic immunotherapy      Dr. Randa Evens, MD, MPH [facility] Pager- [phone] 02/13/2017 10:53 AM

## 2017-02-13 NOTE — Progress Notes (Signed)
Patient saw cardiologist yesterday.  Switched him from carvedilol 3.125 twice a day to metoprolol 100 mg daily.  Patient states his throat is sore, but he is eating well.

## 2017-02-27 ENCOUNTER — Inpatient Hospital Stay: Payer: Medicare Other

## 2017-02-27 ENCOUNTER — Inpatient Hospital Stay (HOSPITAL_BASED_OUTPATIENT_CLINIC_OR_DEPARTMENT_OTHER): Payer: Medicare Other | Admitting: Hematology and Oncology

## 2017-02-27 ENCOUNTER — Encounter: Payer: Self-pay | Admitting: Hematology and Oncology

## 2017-02-27 VITALS — BP 93/67 | HR 110 | Temp 98.9°F | Ht 71.0 in | Wt 202.4 lb

## 2017-02-27 DIAGNOSIS — Z923 Personal history of irradiation: Secondary | ICD-10-CM | POA: Diagnosis not present

## 2017-02-27 DIAGNOSIS — Z5111 Encounter for antineoplastic chemotherapy: Secondary | ICD-10-CM | POA: Diagnosis not present

## 2017-02-27 DIAGNOSIS — C3411 Malignant neoplasm of upper lobe, right bronchus or lung: Secondary | ICD-10-CM

## 2017-02-27 DIAGNOSIS — Z5112 Encounter for antineoplastic immunotherapy: Secondary | ICD-10-CM | POA: Insufficient documentation

## 2017-02-27 DIAGNOSIS — C770 Secondary and unspecified malignant neoplasm of lymph nodes of head, face and neck: Secondary | ICD-10-CM

## 2017-02-27 DIAGNOSIS — Z79899 Other long term (current) drug therapy: Secondary | ICD-10-CM

## 2017-02-27 DIAGNOSIS — M7989 Other specified soft tissue disorders: Secondary | ICD-10-CM | POA: Diagnosis not present

## 2017-02-27 DIAGNOSIS — G893 Neoplasm related pain (acute) (chronic): Secondary | ICD-10-CM | POA: Diagnosis not present

## 2017-02-27 LAB — CBC WITH DIFFERENTIAL/PLATELET
BASOS ABS: 0 10*3/uL (ref 0–0.1)
BASOS PCT: 1 %
EOS ABS: 0.2 10*3/uL (ref 0–0.7)
Eosinophils Relative: 5 %
HCT: 32.8 % — ABNORMAL LOW (ref 40.0–52.0)
HEMOGLOBIN: 11.2 g/dL — AB (ref 13.0–18.0)
Lymphocytes Relative: 20 %
Lymphs Abs: 0.7 10*3/uL — ABNORMAL LOW (ref 1.0–3.6)
MCH: 31.2 pg (ref 26.0–34.0)
MCHC: 34.2 g/dL (ref 32.0–36.0)
MCV: 91.3 fL (ref 80.0–100.0)
MONO ABS: 0.7 10*3/uL (ref 0.2–1.0)
Monocytes Relative: 19 %
NEUTROS ABS: 2.1 10*3/uL (ref 1.4–6.5)
NEUTROS PCT: 55 %
Platelets: 155 10*3/uL (ref 150–440)
RBC: 3.59 MIL/uL — AB (ref 4.40–5.90)
RDW: 16.1 % — ABNORMAL HIGH (ref 11.5–14.5)
WBC: 3.7 10*3/uL — ABNORMAL LOW (ref 3.8–10.6)

## 2017-02-27 LAB — COMPREHENSIVE METABOLIC PANEL
ALBUMIN: 3.3 g/dL — AB (ref 3.5–5.0)
ALK PHOS: 107 U/L (ref 38–126)
ALT: 15 U/L — AB (ref 17–63)
AST: 15 U/L (ref 15–41)
Anion gap: 8 (ref 5–15)
BUN: 20 mg/dL (ref 6–20)
CALCIUM: 9.7 mg/dL (ref 8.9–10.3)
CO2: 21 mmol/L — AB (ref 22–32)
CREATININE: 1.29 mg/dL — AB (ref 0.61–1.24)
Chloride: 108 mmol/L (ref 101–111)
GFR calc Af Amer: >60 mL/min (ref >60)
GFR calc non Af Amer: 53 mL/min — ABNORMAL LOW (ref >60)
GLUCOSE: 142 mg/dL — AB (ref 65–99)
Potassium: 4.4 mmol/L (ref 3.5–5.1)
SODIUM: 137 mmol/L (ref 135–145)
Total Bilirubin: 0.5 mg/dL (ref 0.3–1.2)
Total Protein: 8.1 g/dL (ref 6.5–8.1)

## 2017-02-27 MED ORDER — SODIUM CHLORIDE 0.9% FLUSH
10.0000 mL | INTRAVENOUS | Status: DC | PRN
  Administered 2017-02-27: 10 mL via INTRAVENOUS
  Filled 2017-02-27: qty 10

## 2017-02-27 MED ORDER — HEPARIN SOD (PORK) LOCK FLUSH 100 UNIT/ML IV SOLN
500.0000 [IU] | Freq: Once | INTRAVENOUS | Status: AC
  Administered 2017-02-27: 500 [IU] via INTRAVENOUS

## 2017-02-27 NOTE — Progress Notes (Signed)
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 Patient is a 76 year old African-American male with a past medical history significant for COPD for which she sees Dr. Humphrey Rolls. He was recently admitted to the hospital in March 2018 with symptoms of pneumonia. CT thorax at that time showed: Perihilar lung mass with associated postobstructive atelectasis and consolidation of the right upper lobe  2. This was followed by a PET CT scan on 10/10/2016 which showed:1. Hypermetabolic RIGHT hilar mass with postobstructive collapse of the RIGHT upper lobe consistent with primary or metastatic bronchogenic carcinoma. 2. Hypermetabolic RIGHT paratracheal, prevascular and RIGHT supraclavicular nodal metastasis. 3. RIGHT supraclavicular node is intensely metabolic but likely too small for biopsy. 4. RIGHT lower lobe peripheral nodular thickening with associated metabolic activity is likely inflammatory. 5. Severe bullous change in the LEFT hemithorax  3. CT abdomen from 10/10/2016 showed no evidence of metastatic disease.  4. 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 Patient has problems with bilateral lower extremity swelling as well as shortness of breath on minimal exertion. He uses nighttime oxygen. He follows up with Dr. Floyce Stakes pulmonary as well as cardiology  6. Concurrent chemo/RT started on 11/13/16. Patient completed 5 weekly cycles of chemo/RT. He then had a gap of 3  weeks and restarted radiation boost for 3 weeks with concurrent chemo/RT. Interim scans after 5 weekly cycles showed partial response to treatment  Interval history-  The patient was last seen in the medical oncology clinic by Dr. Janese Banks on 02/13/2017. At that time he noted occasional throat pain and globus sensation for which oxycodone was helping.  He had persistent stable leg swelling.  He received week #1 durvalumab (Imfinzi).  Patient presents today two weeks status post his first Imfinzi treatment. Patient reports generalized weakness and shortness of breath for the first week following treatment. This past week, patient reports that he is feeling "better", however he still has episodes of shortness of breath. Patient denies fevers and weight loss. He reports that his appetite has decreased; 4lb weight loss since his last visit.   ECOG PS- 1 Pain scale- 0 Opioid associated constipation- no  Review of systems- Review of Systems  Constitutional: Positive for weight loss (4lbs since last visit). Negative for chills, fever and malaise/fatigue.  HENT: Negative for congestion, ear discharge and nosebleeds.   Eyes: Negative for blurred vision.  Respiratory: Positive for shortness of breath (intermittent episodes). Negative for cough, hemoptysis, sputum production and wheezing.   Cardiovascular: Negative for chest pain, palpitations, orthopnea and claudication.  Gastrointestinal: Negative for abdominal pain, blood in stool, constipation, diarrhea, heartburn, melena, nausea and vomiting.  Genitourinary: Negative for dysuria, flank pain, frequency, hematuria and urgency.  Musculoskeletal: Negative for back pain, joint pain and myalgias.  Skin: Negative for rash.  Neurological: Negative for dizziness, tingling, focal weakness, seizures, weakness and headaches.  Endo/Heme/Allergies: Does not bruise/bleed easily.  Psychiatric/Behavioral: Negative for depression and suicidal ideas. The patient does not  have insomnia.       No Known Allergies  Past Medical History:  Diagnosis Date  . Cancer ([place])    Lung CA  . CHF (congestive heart failure) ([place])   . COPD (chronic obstructive pulmonary disease) ([place])   . Coronary artery disease   . Diabetes mellitus without complication ([place])   . Fibromyalgia   . GERD (gastroesophageal reflux disease)   . Hypertension      Past Surgical History:  Procedure Laterality Date  . COLONOSCOPY WITH PROPOFOL N/A 01/18/2015   Procedure: COLONOSCOPY WITH PROPOFOL;  Surgeon: Manya Silvas, MD;  Location: [facility] ENDOSCOPY;  Service: Endoscopy;  Laterality: N/A;  . ESOPHAGOGASTRODUODENOSCOPY N/A 01/18/2015   Procedure: ESOPHAGOGASTRODUODENOSCOPY (EGD);  Surgeon: Manya Silvas, MD;  Location: [facility] ENDOSCOPY;  Service: Endoscopy;  Laterality: N/A;  . EYE SURGERY    . FLEXIBLE BRONCHOSCOPY N/A 10/22/2016   Procedure: FLEXIBLE BRONCHOSCOPY;  Surgeon: Allyne Gee, MD;  Location: [facility] ORS;  Service: Pulmonary;  Laterality: N/A;  . HERNIA REPAIR    . IR FLUORO GUIDE PORT INSERTION LEFT  11/07/2016  . REPAIR KNEE LIGAMENT    . TONSILLECTOMY      Social History   Social History  . Marital status: Married    Spouse name: N/A  . Number of children: N/A  . Years of education: N/A   Occupational History  . Not on file.   Social History Main Topics  . Smoking status: Former Smoker    Quit date: 12/01/2003  . Smokeless tobacco: Never Used  . Alcohol use 1.2 oz/week    2 Shots of liquor per week     Comment: on the weeknd 2 shots  . Drug use: No  . Sexual activity: Not on file   Other Topics Concern  . Not on file   Social History Narrative  . No narrative on file    Family History  Problem Relation Age of Onset  . Prostate cancer Father   . Prostate cancer Brother   . Diabetes Unknown        all 13 siblings  . Hypertension Unknown        all 13 siblings     Current Outpatient Prescriptions:  .  acetaminophen (TYLENOL) 325 MG tablet,  Take 2 tablets (650 mg total) by mouth every 6 (six) hours as needed for mild pain (or Fever >/= 101)., Disp: , Rfl:  .  albuterol-ipratropium (COMBIVENT) 18-103 MCG/ACT inhaler, Inhale 2 puffs into the lungs every 6 (six) hours as needed for wheezing or shortness of breath., Disp: , Rfl:  .  aspirin 325 MG tablet, Take 325 mg by mouth daily., Disp: , Rfl:  .  benzonatate (TESSALON) 200 MG capsule, Take 1 capsule (200 mg total) by mouth 3 (three) times daily as needed for cough., Disp: 20 capsule, Rfl: 0 .  carvedilol (COREG) 25 MG tablet, Take 25 mg by mouth 2 (two) times daily with a meal., Disp: , Rfl:  .  Fluticasone Furoate-Vilanterol 100-25 MCG/INH AEPB, Inhale 1 puff into the lungs daily. , Disp: , Rfl:  .  furosemide (LASIX) 40 MG tablet, Take 40 mg by mouth daily., Disp: , Rfl:  .  lidocaine-prilocaine (EMLA) cream, Apply to affected area once, Disp: 30 g, Rfl: 3 .  losartan (COZAAR) 100 MG tablet, Take 100 mg by mouth daily., Disp: , Rfl:  .  metFORMIN (GLUCOPHAGE) 500 MG tablet, Take 500 mg by mouth 2 (two) times daily. , Disp: , Rfl:  .  metoprolol succinate (TOPROL-XL) 100 MG 24 hr tablet, Take 100  mg by mouth daily. Take with or immediately following a meal., Disp: , Rfl:  .  Oxycodone HCl 10 MG TABS, Take 1 tablet (10 mg total) by mouth every 6 (six) hours as needed., Disp: 30 tablet, Rfl: 0 .  simvastatin (ZOCOR) 10 MG tablet, Take 10 mg by mouth every evening. , Disp: , Rfl:  .  sitaGLIPtin (JANUVIA) 50 MG tablet, Take 50 mg by mouth daily., Disp: , Rfl:  .  omeprazole (PRILOSEC) 40 MG capsule, Take 40 mg by mouth daily., Disp: , Rfl:  .  sucralfate (CARAFATE) 1 g tablet, Take 1 tablet (1 g total) by mouth 3 (three) times daily. Dissolve in 2-3 tbsp warm water, swish and swallow. (Patient not taking: Reported on 02/13/2017), Disp: 90 tablet, Rfl: 3 No current facility-administered medications for this visit.   Facility-Administered Medications Ordered in Other Visits:  .  heparin  lock flush 100 unit/mL, 500 Units, Intravenous, Once, Randa Evens C, MD .  sodium chloride flush (NS) 0.9 % injection 10 mL, 10 mL, Intravenous, PRN, Sindy Guadeloupe, MD, 10 mL at 01/30/17 0958 .  sodium chloride flush (NS) 0.9 % injection 10 mL, 10 mL, Intravenous, PRN, Sindy Guadeloupe, MD, 10 mL at 02/27/17 0853  Physical exam:  Vitals:   02/27/17 0908  BP: 93/67  Pulse: (!) 110  Temp: 98.9 F (37.2 C)  TempSrc: Tympanic  Weight: 202 lb 6.4 oz (91.8 kg)  Height: _0  (1.803 m)   Physical Exam  Constitutional: He is oriented to person, place, and time and well-developed, well-nourished, and in no distress.  HENT:  Head: Normocephalic and atraumatic.  Eyes: Pupils are equal, round, and reactive to light. EOM are normal.  Neck: Normal range of motion.  Cardiovascular: Normal rate, regular rhythm and normal heart sounds.   Pulmonary/Chest: Effort normal and breath sounds normal.  Abdominal: Soft. Bowel sounds are normal.  Musculoskeletal: He exhibits edema (b/l +1).  Neurological: He is alert and oriented to person, place, and time.  Skin: Skin is warm and dry.     CMP Latest Ref Rng & Units 02/27/2017  Glucose 65 - 99 mg/dL 142(H)  BUN 6 - 20 mg/dL 20  Creatinine 0.61 - 1.24 mg/dL 1.29(H)  Sodium 135 - 145 mmol/L 137  Potassium 3.5 - 5.1 mmol/L 4.4  Chloride 101 - 111 mmol/L 108  CO2 22 - 32 mmol/L 21(L)  Calcium 8.9 - 10.3 mg/dL 9.7  Total Protein 6.5 - 8.1 g/dL 8.1  Total Bilirubin 0.3 - 1.2 mg/dL 0.5  Alkaline Phos 38 - 126 U/L 107  AST 15 - 41 U/L 15  ALT 17 - 63 U/L 15(L)   CBC Latest Ref Rng & Units 02/27/2017  WBC 3.8 - 10.6 K/uL 3.7(L)  Hemoglobin 13.0 - 18.0 g/dL 11.2(L)  Hematocrit 40.0 - 52.0 % 32.8(L)  Platelets 150 - 440 K/uL 155      Assessment and plan- Patient is a 76 y.o. male with SCC of RUL Stage IIIB T1N3M0. He has completed concurrent chemo/RT with partial response. He is here for treatment assessment prior to cycle #2 of durvalumab.  He had  significant symptoms following cycle #1 with weakness, nausea, and shortness of breath.  He has lost weight.  Leg swelling- chronic. Being followed by cardiology and pulmonary  1. No treatment today.  Patient requesting dose reduction in treatment.  Discuss no reduction per guidelines.  Discuss follow-up with Dr. Janese Banks in 2 weeks when she is back for consideration of cycle #  2. 2. Offered CXR to assess shortness of breath. Patient declined at this time, citing that he would wait and discuss this with Dr. Janese Banks.   2. RTC on 03/13/2017 for MD assessment, labs (CBC with diff, CMP) and +/- cycle #2 durvalumab.    Visit Diagnosis 1. Malignant neoplasm of upper lobe of right lung ([place])   2. Encounter for antineoplastic immunotherapy      Nolon Stalls, MD [facility] 02/27/2017 10:02 AM

## 2017-02-27 NOTE — Progress Notes (Signed)
Pt states that after his chemo with the durvalumab and he was sick- nausea, felling weak. He usually walks [place] but he could not he got sob just walking to one store. It took him a week ar maybe a little past that to feel better. Bowels are fine, appetite not as good but drinking plenty of fluids

## 2017-03-09 ENCOUNTER — Telehealth: Payer: Self-pay | Admitting: *Deleted

## 2017-03-09 NOTE — Telephone Encounter (Signed)
Pt and wife have been made aware and instructed to call back if unable to have BM or if pain worsens.

## 2017-03-09 NOTE — Telephone Encounter (Signed)
Pt called in to report right sided abdominal pain that started 3 days ago. Last BM was 3 days ago and pt states that is normal for him. Please advise.

## 2017-03-09 NOTE — Telephone Encounter (Signed)
Please have him take some stool softeners- miralax and senna. If abdominal pain persists despite having a BM, he should call us

## 2017-03-10 ENCOUNTER — Emergency Department: Payer: Medicare Other

## 2017-03-10 ENCOUNTER — Other Ambulatory Visit: Payer: Self-pay

## 2017-03-10 ENCOUNTER — Encounter: Payer: Self-pay | Admitting: Emergency Medicine

## 2017-03-10 ENCOUNTER — Telehealth: Payer: Self-pay | Admitting: *Deleted

## 2017-03-10 ENCOUNTER — Emergency Department
Admission: EM | Admit: 2017-03-10 | Discharge: 2017-03-10 | Disposition: A | Discharge status: Home / Self Care | Payer: Medicare Other | Attending: Emergency Medicine | Admitting: Emergency Medicine

## 2017-03-10 DIAGNOSIS — Z7984 Long term (current) use of oral hypoglycemic drugs: Secondary | ICD-10-CM | POA: Diagnosis not present

## 2017-03-10 DIAGNOSIS — J439 Emphysema, unspecified: Secondary | ICD-10-CM | POA: Diagnosis not present

## 2017-03-10 DIAGNOSIS — E1110 Type 2 diabetes mellitus with ketoacidosis without coma: Secondary | ICD-10-CM | POA: Diagnosis not present

## 2017-03-10 DIAGNOSIS — I119 Hypertensive heart disease without heart failure: Secondary | ICD-10-CM | POA: Insufficient documentation

## 2017-03-10 DIAGNOSIS — Z87891 Personal history of nicotine dependence: Secondary | ICD-10-CM | POA: Insufficient documentation

## 2017-03-10 DIAGNOSIS — Z7982 Long term (current) use of aspirin: Secondary | ICD-10-CM | POA: Insufficient documentation

## 2017-03-10 DIAGNOSIS — Z79899 Other long term (current) drug therapy: Secondary | ICD-10-CM | POA: Insufficient documentation

## 2017-03-10 DIAGNOSIS — R079 Chest pain, unspecified: Secondary | ICD-10-CM | POA: Diagnosis present

## 2017-03-10 DIAGNOSIS — I2699 Other pulmonary embolism without acute cor pulmonale: Secondary | ICD-10-CM | POA: Diagnosis not present

## 2017-03-10 LAB — COMPREHENSIVE METABOLIC PANEL
ALK PHOS: 108 U/L (ref 38–126)
ALT: 12 U/L — AB (ref 17–63)
AST: 16 U/L (ref 15–41)
Albumin: 3.4 g/dL — ABNORMAL LOW (ref 3.5–5.0)
Anion gap: 8 (ref 5–15)
BUN: 28 mg/dL — ABNORMAL HIGH (ref 6–20)
CALCIUM: 9.8 mg/dL (ref 8.9–10.3)
CO2: 23 mmol/L (ref 22–32)
CREATININE: 1.76 mg/dL — AB (ref 0.61–1.24)
Chloride: 110 mmol/L (ref 101–111)
GFR calc Af Amer: 42 mL/min — ABNORMAL LOW (ref >60)
GFR, EST NON AFRICAN AMERICAN: 36 mL/min — AB (ref >60)
Glucose, Bld: 207 mg/dL — ABNORMAL HIGH (ref 65–99)
Potassium: 4.3 mmol/L (ref 3.5–5.1)
Sodium: 141 mmol/L (ref 135–145)
TOTAL PROTEIN: 8.3 g/dL — AB (ref 6.5–8.1)
Total Bilirubin: 0.5 mg/dL (ref 0.3–1.2)

## 2017-03-10 LAB — CBC WITH DIFFERENTIAL/PLATELET
Basophils Absolute: 0 10*3/uL (ref 0–0.1)
Basophils Relative: 0 %
EOS PCT: 5 %
Eosinophils Absolute: 0.2 10*3/uL (ref 0–0.7)
HCT: 36.2 % — ABNORMAL LOW (ref 40.0–52.0)
Hemoglobin: 12.1 g/dL — ABNORMAL LOW (ref 13.0–18.0)
LYMPHS PCT: 20 %
Lymphs Abs: 0.8 10*3/uL — ABNORMAL LOW (ref 1.0–3.6)
MCH: 30.7 pg (ref 26.0–34.0)
MCHC: 33.4 g/dL (ref 32.0–36.0)
MCV: 91.9 fL (ref 80.0–100.0)
MONOS PCT: 18 %
Monocytes Absolute: 0.7 10*3/uL (ref 0.2–1.0)
Neutro Abs: 2.3 10*3/uL (ref 1.4–6.5)
Neutrophils Relative %: 57 %
PLATELETS: 173 10*3/uL (ref 150–440)
RBC: 3.94 MIL/uL — AB (ref 4.40–5.90)
RDW: 16.3 % — ABNORMAL HIGH (ref 11.5–14.5)
WBC: 4.1 10*3/uL (ref 3.8–10.6)

## 2017-03-10 LAB — TROPONIN I: Troponin I: <0.03 ng/mL (ref <0.03)

## 2017-03-10 LAB — BRAIN NATRIURETIC PEPTIDE: B Natriuretic Peptide: 19 pg/mL (ref 0.0–100.0)

## 2017-03-10 LAB — FIBRIN DERIVATIVES D-DIMER (ARMC ONLY): Fibrin derivatives D-dimer (ARMC): 3137.11 — ABNORMAL HIGH (ref 0.00–499.00)

## 2017-03-10 MED ORDER — RIVAROXABAN (XARELTO) VTE STARTER PACK (15 & 20 MG): ORAL_TABLET | ORAL | 0 refills | Status: DC

## 2017-03-10 MED ORDER — SODIUM CHLORIDE 0.9 % IV SOLN
Freq: Once | INTRAVENOUS | Status: AC
  Administered 2017-03-10: 03:00:00 via INTRAVENOUS

## 2017-03-10 MED ORDER — IOPAMIDOL (ISOVUE-370) INJECTION 76%
60.0000 mL | Freq: Once | INTRAVENOUS | Status: AC | PRN
  Administered 2017-03-10: 60 mL via INTRAVENOUS

## 2017-03-10 NOTE — ED Notes (Signed)
Pt up to toilet 

## 2017-03-10 NOTE — ED Triage Notes (Signed)
Pt in with co right sided chest pain, hx of lunch CA has had chemo and radition recently. Also co worsening shob when he walks.

## 2017-03-10 NOTE — ED Notes (Signed)
Pt has bilateral lower extremity swelling. Pt also c/o of SHOB and better when able to sit up straight.

## 2017-03-10 NOTE — ED Provider Notes (Addendum)
[facility] Emergency Department Provider Note   ____________________________________________   First MD Initiated Contact with Patient 03/10/17 [phone]     (approximate)  I have reviewed the triage vital signs and the nursing notes.   HISTORY  Chief Complaint Chest Pain    HPI Chris Wilkinson is a 76 y.o. male Reports he had lung cancer but was declared cancer free and stopped his chemotherapy and radiation 3 weeks ago. He complains of pain in the right side of his chest worse with deep breathing and movement and walking. His shortness of breath that he has gets worse with walking as well. He has had some swelling in his legs but was put on Lasix for that and that seems to be somewhat better.   Past Medical History:  Diagnosis Date  . Cancer ([place])    Lung CA  . CHF (congestive heart failure) ([place])   . COPD (chronic obstructive pulmonary disease) ([place])   . Coronary artery disease   . Diabetes mellitus without complication ([place])   . Fibromyalgia   . GERD (gastroesophageal reflux disease)   . Hypertension     Patient Active Problem List   Diagnosis Date Noted  . Encounter for antineoplastic immunotherapy 02/27/2017  . Carpal tunnel syndrome 12/04/2016  . Sprain of wrist 12/04/2016  . Goals of care, counseling/discussion 11/20/2016  . Lung cancer ([place]) 11/04/2016  . HCAP (healthcare-associated pneumonia) 09/22/2016  . Sepsis ([place]) 08/04/2015  . CAP (community acquired pneumonia) 08/04/2015  . COPD (chronic obstructive pulmonary disease) ([place]) 08/04/2015  . Type 2 diabetes mellitus ([place]) 08/04/2015  . CAD (coronary artery disease) 08/04/2015  . HTN (hypertension) 08/04/2015  . GERD (gastroesophageal reflux disease) 08/04/2015  . Bloating 12/04/2014  . Gas 12/04/2014  . Hx of adenomatous polyp of colon 12/04/2014    Past Surgical History:  Procedure Laterality Date  . COLONOSCOPY WITH PROPOFOL N/A 01/18/2015   Procedure: COLONOSCOPY WITH  PROPOFOL;  Surgeon: Manya Silvas, MD;  Location: [facility] ENDOSCOPY;  Service: Endoscopy;  Laterality: N/A;  . ESOPHAGOGASTRODUODENOSCOPY N/A 01/18/2015   Procedure: ESOPHAGOGASTRODUODENOSCOPY (EGD);  Surgeon: Manya Silvas, MD;  Location: [facility] ENDOSCOPY;  Service: Endoscopy;  Laterality: N/A;  . EYE SURGERY    . FLEXIBLE BRONCHOSCOPY N/A 10/22/2016   Procedure: FLEXIBLE BRONCHOSCOPY;  Surgeon: Allyne Gee, MD;  Location: [facility] ORS;  Service: Pulmonary;  Laterality: N/A;  . HERNIA REPAIR    . IR FLUORO GUIDE PORT INSERTION LEFT  11/07/2016  . REPAIR KNEE LIGAMENT    . TONSILLECTOMY      Prior to Admission medications   Medication Sig Start Date End Date Taking? Authorizing Provider  acetaminophen (TYLENOL) 325 MG tablet Take 2 tablets (650 mg total) by mouth every 6 (six) hours as needed for mild pain (or Fever >/= 101). 09/26/16  Yes Gouru, Illene Silver, MD  albuterol-ipratropium (COMBIVENT) 18-103 MCG/ACT inhaler Inhale 2 puffs into the lungs every 6 (six) hours as needed for wheezing or shortness of breath.   Yes [provider]  aspirin 325 MG tablet Take 325 mg by mouth daily.   Yes [provider]  benzonatate (TESSALON) 200 MG capsule Take 1 capsule (200 mg total) by mouth 3 (three) times daily as needed for cough. 09/26/16  Yes Gouru, Illene Silver, MD  carvedilol (COREG) 3.125 MG tablet Take 3.125 mg by mouth 2 (two) times daily with a meal.   Yes [provider]  Fluticasone Furoate-Vilanterol 100-25 MCG/INH AEPB Inhale 1 puff into the lungs daily.  Yes [provider]  furosemide (LASIX) 40 MG tablet Take 40 mg by mouth daily.   Yes [provider]  lidocaine-prilocaine (EMLA) cream Apply to affected area once 02/09/17  Yes Sindy Guadeloupe, MD  losartan (COZAAR) 100 MG tablet Take 100 mg by mouth daily.   Yes [provider]  metFORMIN (GLUCOPHAGE) 500 MG tablet Take 500 mg by mouth 2 (two) times daily.    Yes [provider]    metoprolol succinate (TOPROL-XL) 100 MG 24 hr tablet Take 100 mg by mouth daily. Take with or immediately following a meal.   Yes [provider]  omeprazole (PRILOSEC) 40 MG capsule Take 40 mg by mouth daily.   Yes [provider]  Oxycodone HCl 10 MG TABS Take 1 tablet (10 mg total) by mouth every 6 (six) hours as needed. 02/13/17  Yes Sindy Guadeloupe, MD  simvastatin (ZOCOR) 10 MG tablet Take 10 mg by mouth every evening.    Yes [provider]  sitaGLIPtin (JANUVIA) 50 MG tablet Take 50 mg by mouth daily.   Yes [provider]  sucralfate (CARAFATE) 1 g tablet Take 1 tablet (1 g total) by mouth 3 (three) times daily. Dissolve in 2-3 tbsp warm water, swish and swallow. Patient not taking: Reported on 02/13/2017 12/02/16   Noreene Filbert, MD    Allergies Patient has no known allergies.  Family History  Problem Relation Age of Onset  . Prostate cancer Father   . Prostate cancer Brother   . Diabetes Unknown        all 13 siblings  . Hypertension Unknown        all 13 siblings    Social History Social History  Substance Use Topics  . Smoking status: Former Smoker    Quit date: 12/01/2003  . Smokeless tobacco: Never Used  . Alcohol use 1.2 oz/week    2 Shots of liquor per week     Comment: on the weeknd 2 shots    Review of Systems  Constitutional: No fever/chills Eyes: No visual changes. ENT: No sore throat. Cardiovascular: see history of present illness Respiratory: see history of present illness Gastrointestinal: No abdominal pain.  No nausea, no vomiting.  No diarrhea.  No constipation. Genitourinary: Negative for dysuria. Musculoskeletal: Negative for back pain. Skin: Negative for rash. Neurological: Negative for headaches, focal weakness   ____________________________________________   PHYSICAL EXAM:  VITAL SIGNS: ED Triage Vitals  Enc Vitals Group     BP 03/10/17 0019 126/71     Pulse Rate 03/10/17 0019 (!) 117     Resp  03/10/17 0019 (!) 23     Temp 03/10/17 0019 98.8 F (37.1 C)     Temp Source 03/10/17 0019 Oral     SpO2 03/10/17 0019 92 %     Weight 03/10/17 0020 203 lb (92.1 kg)     Height 03/10/17 0020 _0  (1.803 m)     Head Circumference --      Peak Flow --      Pain Score 03/10/17 0019 9     Pain Loc --      Pain Edu? --      Excl. in [place]? --     Constitutional: Alert and oriented. Well appearing and in no acute distress. Eyes: Conjunctivae are normal.  Head: Atraumatic. Nose: No congestion/rhinnorhea. Mouth/Throat: Mucous membranes are moist.  Oropharynx non-erythematous. Neck: No stridor. Cardiovascular: Normal rate, regular rhythm. Grossly normal heart sounds.  Good peripheral circulation.no  chest pain to palpation Respiratory: Normal respiratory effort.  No retractions. Lungs CTAB. Gastrointestinal: Soft and nontender. No distention. No abdominal bruits. No CVA tenderness. Musculoskeletal: No lower extremity tenderness trace bilateral edema.  No joint effusions. Neurologic:  Normal speech and language. No gross focal neurologic deficits are appreciated.  Skin:  Skin is warm, dry and intact. No rash noted. Psychiatric: Mood and affect are normal. Speech and behavior are normal.  ____________________________________________   LABS (all labs ordered are listed, but only abnormal results are displayed)  Labs Reviewed  COMPREHENSIVE METABOLIC PANEL - Abnormal; Notable for the following:       Result Value   Glucose, Bld 207 (*)    BUN 28 (*)    Creatinine, Ser 1.76 (*)    Total Protein 8.3 (*)    Albumin 3.4 (*)    ALT 12 (*)    GFR calc non Af Amer 36 (*)    GFR calc Af Amer 42 (*)    All other components within normal limits  CBC WITH DIFFERENTIAL/PLATELET - Abnormal; Notable for the following:    RBC 3.94 (*)    Hemoglobin 12.1 (*)    HCT 36.2 (*)    RDW 16.3 (*)    Lymphs Abs 0.8 (*)    All other components within normal limits  FIBRIN DERIVATIVES D-DIMER ([facility] ONLY)  - Abnormal; Notable for the following:    Fibrin derivatives D-dimer [facility]) 3,137.11 (*)    All other components within normal limits  TROPONIN I  BRAIN NATRIURETIC PEPTIDE   ____________________________________________  EKG  eKG read and interpreted by me shows sinus tachycardia rate of 110, normal axis nonspecific T-wave changes ____________________________________________  RADIOLOGY  Dg Chest 2 View  Result Date: 03/10/2017 CLINICAL DATA:  Right-sided chest pain for 2 days. EXAM: CHEST  2 VIEW COMPARISON:  11/30/2016, 12/31/2016. FINDINGS: Left jugular port remain satisfactorily positioned with tip at the expected location of the superior cavoatrial junction. 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 Mild central right upper lobe opacity is unchanged or mildly reduced. No new airspace consolidation. No evidence of pneumothorax. No pleural effusion. IMPRESSION: Severe bullous disease. Mild central right upper lobe opacity is unchanged or slightly reduced from 11/30/2016. Electronically Signed   By: Andreas Newport M.D.   On: 03/10/2017 01:41   Ct Angio Chest Pe W And/or Wo Contrast  Result Date: 03/10/2017 CLINICAL DATA:  Chest pain with positive D-dimer.  Lung cancer. EXAM: CT ANGIOGRAPHY CHEST WITH CONTRAST TECHNIQUE: Multidetector CT imaging of the chest was performed using the standard protocol during bolus administration of intravenous contrast. Multiplanar CT image reconstructions and MIPs were obtained to evaluate the vascular anatomy. CONTRAST:  60 mL Isovue 370 COMPARISON:  Chest radiograph 03/10/2017 Chest CT 12/31/2016 FINDINGS: Cardiovascular: Contrast injection is sufficient to demonstrate satisfactory opacification of the pulmonary arteries to the segmental level. There is a small pulmonary embolus within the right lower lobe posterior basal segmental artery (series 7 images 219-226). The main pulmonary artery is mildly enlarged, measuring 3.3 cm at the bifurcation. There  is no CT evidence of acute right heart strain. There is calcific aortic atherosclerosis. There is a normal 3-vessel arch branching pattern. Heart size is normal, without pericardial effusion. Mediastinum/Nodes: Unchanged right hilar lymphadenopathy. The visualized thyroid and thoracic esophageal course are unremarkable. Lungs/Pleura: There is severe emphysema with multiple large bullae at the lung apices. There is fibrotic change of the posterior right upper lobe. Findings are likely secondary to prior radiation. In the right  upper lobe, there is a 3 cm mass, which is approximately unchanged in size. Upper Abdomen: Contrast bolus timing is not optimized for evaluation of the abdominal organs. Large left renal cyst measures 6.9 cm. Musculoskeletal: Multilevel thoracic osteophytosis. No lytic or blastic lesions. Review of the MIP images confirms the above findings. IMPRESSION: 1. Small pulmonary embolus within the right lower lobe posterior basal segmental artery. No evidence of right heart strain. 2. Mildly enlarged main pulmonary artery, which may indicate underlying pulmonary hypertension. 3. Severe emphysema (ICD10-J43.9). Unchanged size of mass within the right upper lobe. 4.  Aortic Atherosclerosis (ICD10-I70.0). Electronically Signed   By: Ulyses Jarred M.D.   On: 03/10/2017 03:16   IMPRESSION: 1. Small pulmonary embolus within the right lower lobe posterior basal segmental artery. No evidence of right heart strain. 2. Mildly enlarged main pulmonary artery, which may indicate underlying pulmonary hypertension. 3. Severe emphysema (ICD10-J43.9). Unchanged size of mass within the right upper lobe. 4.  Aortic Atherosclerosis (ICD10-I70.0).   Electronically Signed   By: Ulyses Jarred M.D.   On: 03/10/2017 03:16 ____________________________________________   PROCEDURES  Procedure(s) performed:   Procedures  Critical Care performed:    ____________________________________________   INITIAL IMPRESSION / ASSESSMENT AND PLAN / ED COURSE  Pertinent labs & imaging results that were available during my care of the patient were reviewed by me and considered in my medical decision making (see chart for details).  Will treat the patient's L course 10 mg twice a day for a week and then 5 mg twice a day. I initially wrote the xarelto but we will change this to L O[place]. Unfortunately I cannot get the xarelto out of the computer. She does not want to stay in the hospital he is aware of the risks Dr. Marcille Blanco discussed abdomen detail with him. His wife is also with him and agrees.     ____________________________________________   FINAL CLINICAL IMPRESSION(S) / ED DIAGNOSES  Final diagnoses:  Other acute pulmonary embolism without acute cor pulmonale (HCC)  Pulmonary emphysema, unspecified emphysema type (HCC)      NEW MEDICATIONS STARTED DURING THIS VISIT:  New Prescriptions   No medications on file     Note:  This document was prepared using Dragon voice recognition software and may include unintentional dictation errors.    Nena Polio, MD 03/10/17 5038    Nena Polio, MD 03/10/17 0530

## 2017-03-10 NOTE — Discharge Instructions (Signed)
Take the eliquis as directed. Be sure to follow up with your regular doctor that him know about the blood clot and the decreased kidney function. He careful not to fall. Remember the blood thinners will make it bleed more easily. Return for any fever or shortness of breath or any other problems.

## 2017-03-10 NOTE — Telephone Encounter (Signed)
Pt called and wanted to let Dr. Janese Banks know that he had to go to ED last night due to shortness of breath and chest pain. CT scan revealed PE. Pt was given prescription for blood thinner and has started taking it. He just wanted Dr. Janese Banks to be aware. He is scheduled follow up on 8/29 with Dr. Baruch Gouty and 8/31 with Dr. Janese Banks. No further needed.

## 2017-03-11 ENCOUNTER — Inpatient Hospital Stay: Payer: Medicare Other | Admitting: Oncology

## 2017-03-11 ENCOUNTER — Other Ambulatory Visit: Payer: Self-pay | Admitting: *Deleted

## 2017-03-11 ENCOUNTER — Encounter: Payer: Self-pay | Admitting: Radiation Oncology

## 2017-03-11 ENCOUNTER — Ambulatory Visit
Admission: RE | Admit: 2017-03-11 | Discharge: 2017-03-11 | Disposition: A | Discharge status: Home / Self Care | Payer: Medicare Other | Source: Ambulatory Visit | Attending: Radiation Oncology | Admitting: Radiation Oncology

## 2017-03-11 VITALS — BP 129/84 | HR 121 | Temp 99.2°F | Wt 203.5 lb

## 2017-03-11 DIAGNOSIS — Z87891 Personal history of nicotine dependence: Secondary | ICD-10-CM | POA: Insufficient documentation

## 2017-03-11 DIAGNOSIS — C3491 Malignant neoplasm of unspecified part of right bronchus or lung: Secondary | ICD-10-CM

## 2017-03-11 DIAGNOSIS — I2699 Other pulmonary embolism without acute cor pulmonale: Secondary | ICD-10-CM | POA: Diagnosis not present

## 2017-03-11 DIAGNOSIS — C3401 Malignant neoplasm of right main bronchus: Secondary | ICD-10-CM | POA: Diagnosis present

## 2017-03-11 DIAGNOSIS — M545 Low back pain: Secondary | ICD-10-CM | POA: Insufficient documentation

## 2017-03-11 DIAGNOSIS — Z9221 Personal history of antineoplastic chemotherapy: Secondary | ICD-10-CM | POA: Insufficient documentation

## 2017-03-11 DIAGNOSIS — Z923 Personal history of irradiation: Secondary | ICD-10-CM | POA: Insufficient documentation

## 2017-03-11 HISTORY — DX: Other pulmonary embolism without acute cor pulmonale: I26.99

## 2017-03-11 MED ORDER — APIXABAN 5 MG PO TABS: 10.0000 mg | ORAL_TABLET | Freq: Two times a day (BID) | ORAL | 0 refills | Status: DC

## 2017-03-11 NOTE — Progress Notes (Signed)
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 Patient is a 76 year old African-American male with a past medical history significant for COPD for which she sees Dr. Humphrey Rolls. He was recently admitted to the hospital in March 2018 with symptoms of pneumonia. CT thorax at that time showed: Perihilar lung mass with associated postobstructive atelectasis and consolidation of the right upper lobe  2. This was followed by a PET CT scan on 10/10/2016 which showed:1. Hypermetabolic RIGHT hilar mass with postobstructive collapse of the RIGHT upper lobe consistent with primary or metastatic bronchogenic carcinoma. 2. Hypermetabolic RIGHT paratracheal, prevascular and RIGHT supraclavicular nodal metastasis. 3. RIGHT supraclavicular node is intensely metabolic but likely too small for biopsy. 4. RIGHT lower lobe peripheral nodular thickening with associated metabolic activity is likely inflammatory. 5. Severe bullous change in the LEFT hemithorax  3. CT abdomen from 10/10/2016 showed no evidence of metastatic disease.  4. 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 Patient has problems with bilateral lower extremity swelling as well as shortness of breath on minimal exertion. He uses nighttime oxygen. He follows up with Dr. Floyce Stakes pulmonary as well as cardiology  6. Concurrent chemo/RT started on 11/13/16. Patient completed 5 weekly cycles of chemo/RT. He then had a gap of 3 weeks and restarted  radiation boost for 3 weeks with concurrent chemo/RT. Interim scans after 5 weekly cycles showed partial response to treatment  Interval history- The patient was seen in the emergency room yesterday morning for shortness of breath and chest pain. He was diagnosed with a small pulmonary embolism and prescribed Eliquis. Unfortunately he is refusing to take until he speaks with his oncologist.   ECOG FS:0 - Asymptomatic  Review of systems- ROS   Current treatment- No treatment at this time.   No Known Allergies   Past Medical History:  Diagnosis Date  . Cancer ([place])    Lung CA  . CHF (congestive heart failure) ([place])   . COPD (chronic obstructive pulmonary disease) ([place])   . Coronary artery disease   . Diabetes mellitus without complication ([place])   . Fibromyalgia   . GERD (gastroesophageal reflux disease)   . Hypertension   . Pulmonary emboli [facility])      Past Surgical History:  Procedure Laterality Date  . COLONOSCOPY WITH PROPOFOL N/A 01/18/2015   Procedure: COLONOSCOPY WITH PROPOFOL;  Surgeon: Manya Silvas, MD;  Location: [facility] ENDOSCOPY;  Service: Endoscopy;  Laterality: N/A;  . ESOPHAGOGASTRODUODENOSCOPY N/A 01/18/2015   Procedure: ESOPHAGOGASTRODUODENOSCOPY (EGD);  Surgeon: Manya Silvas, MD;  Location: [facility] ENDOSCOPY;  Service: Endoscopy;  Laterality: N/A;  . EYE SURGERY    . FLEXIBLE BRONCHOSCOPY N/A 10/22/2016   Procedure: FLEXIBLE BRONCHOSCOPY;  Surgeon: Allyne Gee, MD;  Location: [facility] ORS;  Service: Pulmonary;  Laterality: N/A;  . HERNIA REPAIR    . IR FLUORO GUIDE PORT INSERTION LEFT  11/07/2016  . REPAIR KNEE LIGAMENT    . TONSILLECTOMY      Social History   Social History  . Marital status: Married  Spouse name: N/A  . Number of children: N/A  . Years of education: N/A   Occupational History  . Not on file.   Social History Main Topics  . Smoking status: Former Smoker    Quit date: 12/01/2003  . Smokeless tobacco: Never Used  . Alcohol use 1.2  oz/week    2 Shots of liquor per week     Comment: on the weeknd 2 shots  . Drug use: No  . Sexual activity: Not on file   Other Topics Concern  . Not on file   Social History Narrative  . No narrative on file    Family History  Problem Relation Age of Onset  . Prostate cancer Father   . Prostate cancer Brother   . Diabetes Unknown        all 13 siblings  . Hypertension Unknown        all 13 siblings     Current Outpatient Prescriptions:  .  acetaminophen (TYLENOL) 325 MG tablet, Take 2 tablets (650 mg total) by mouth every 6 (six) hours as needed for mild pain (or Fever >/= 101)., Disp: , Rfl:  .  albuterol-ipratropium (COMBIVENT) 18-103 MCG/ACT inhaler, Inhale 2 puffs into the lungs every 6 (six) hours as needed for wheezing or shortness of breath., Disp: , Rfl:  .  aspirin 325 MG tablet, Take 325 mg by mouth daily., Disp: , Rfl:  .  benzonatate (TESSALON) 200 MG capsule, Take 1 capsule (200 mg total) by mouth 3 (three) times daily as needed for cough., Disp: 20 capsule, Rfl: 0 .  carvedilol (COREG) 3.125 MG tablet, Take 3.125 mg by mouth 2 (two) times daily with a meal., Disp: , Rfl:  .  Fluticasone Furoate-Vilanterol 100-25 MCG/INH AEPB, Inhale 1 puff into the lungs daily. , Disp: , Rfl:  .  furosemide (LASIX) 40 MG tablet, Take 40 mg by mouth daily., Disp: , Rfl:  .  lidocaine-prilocaine (EMLA) cream, Apply to affected area once, Disp: 30 g, Rfl: 3 .  losartan (COZAAR) 100 MG tablet, Take 100 mg by mouth daily., Disp: , Rfl:  .  metFORMIN (GLUCOPHAGE) 500 MG tablet, Take 500 mg by mouth 2 (two) times daily. , Disp: , Rfl:  .  metoprolol succinate (TOPROL-XL) 100 MG 24 hr tablet, Take 100 mg by mouth daily. Take with or immediately following a meal., Disp: , Rfl:  .  omeprazole (PRILOSEC) 40 MG capsule, Take 40 mg by mouth daily., Disp: , Rfl:  .  Oxycodone HCl 10 MG TABS, Take 1 tablet (10 mg total) by mouth every 6 (six) hours as needed., Disp: 30 tablet, Rfl: 0 .   Rivaroxaban 15 & 20 MG TBPK, Take as directed on package: Start with one 36m tablet by mouth twice a day with food. On Day 22, switch to one 220mtablet once a day with food., Disp: 51 each, Rfl: 0 .  simvastatin (ZOCOR) 10 MG tablet, Take 10 mg by mouth every evening. , Disp: , Rfl:  .  sitaGLIPtin (JANUVIA) 50 MG tablet, Take 50 mg by mouth daily., Disp: , Rfl:  .  sucralfate (CARAFATE) 1 g tablet, Take 1 tablet (1 g total) by mouth 3 (three) times daily. Dissolve in 2-3 tbsp warm water, swish and swallow. (Patient not taking: Reported on 02/13/2017), Disp: 90 tablet, Rfl: 3 No current facility-administered medications for this visit.   Facility-Administered Medications Ordered in Other Visits:  .  sodium chloride flush (NS) 0.9 % injection 10 mL, 10  mL, Intravenous, PRN, Sindy Guadeloupe, MD, 10 mL at 01/30/17 [id number]  Physical exam: There were no vitals filed for this visit. Physical Exam   CMP Latest Ref Rng & Units 03/10/2017  Glucose 65 - 99 mg/dL 207(H)  BUN 6 - 20 mg/dL 28(H)  Creatinine 0.61 - 1.24 mg/dL 1.76(H)  Sodium 135 - 145 mmol/L 141  Potassium 3.5 - 5.1 mmol/L 4.3  Chloride 101 - 111 mmol/L 110  CO2 22 - 32 mmol/L 23  Calcium 8.9 - 10.3 mg/dL 9.8  Total Protein 6.5 - 8.1 g/dL 8.3(H)  Total Bilirubin 0.3 - 1.2 mg/dL 0.5  Alkaline Phos 38 - 126 U/L 108  AST 15 - 41 U/L 16  ALT 17 - 63 U/L 12(L)   CBC Latest Ref Rng & Units 03/10/2017  WBC 3.8 - 10.6 K/uL 4.1  Hemoglobin 13.0 - 18.0 g/dL 12.1(L)  Hematocrit 40.0 - 52.0 % 36.2(L)  Platelets 150 - 440 K/uL 173    No images are attached to the encounter.  Dg Chest 2 View  Result Date: 03/10/2017 CLINICAL DATA:  Right-sided chest pain for 2 days. EXAM: CHEST  2 VIEW COMPARISON:  11/30/2016, 12/31/2016. FINDINGS: Left jugular port remain satisfactorily positioned with tip at the expected location of the superior cavoatrial junction. 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 Mild central right upper lobe opacity is  unchanged or mildly reduced. No new airspace consolidation. No evidence of pneumothorax. No pleural effusion. IMPRESSION: Severe bullous disease. Mild central right upper lobe opacity is unchanged or slightly reduced from 11/30/2016. Electronically Signed   By: Andreas Newport M.D.   On: 03/10/2017 01:41   Ct Angio Chest Pe W And/or Wo Contrast  Result Date: 03/10/2017 CLINICAL DATA:  Chest pain with positive D-dimer.  Lung cancer. EXAM: CT ANGIOGRAPHY CHEST WITH CONTRAST TECHNIQUE: Multidetector CT imaging of the chest was performed using the standard protocol during bolus administration of intravenous contrast. Multiplanar CT image reconstructions and MIPs were obtained to evaluate the vascular anatomy. CONTRAST:  60 mL Isovue 370 COMPARISON:  Chest radiograph 03/10/2017 Chest CT 12/31/2016 FINDINGS: Cardiovascular: Contrast injection is sufficient to demonstrate satisfactory opacification of the pulmonary arteries to the segmental level. There is a small pulmonary embolus within the right lower lobe posterior basal segmental artery (series 7 images 219-226). The main pulmonary artery is mildly enlarged, measuring 3.3 cm at the bifurcation. There is no CT evidence of acute right heart strain. There is calcific aortic atherosclerosis. There is a normal 3-vessel arch branching pattern. Heart size is normal, without pericardial effusion. Mediastinum/Nodes: Unchanged right hilar lymphadenopathy. The visualized thyroid and thoracic esophageal course are unremarkable. Lungs/Pleura: There is severe emphysema with multiple large bullae at the lung apices. There is fibrotic change of the posterior right upper lobe. Findings are likely secondary to prior radiation. In the right upper lobe, there is a 3 cm mass, which is approximately unchanged in size. Upper Abdomen: Contrast bolus timing is not optimized for evaluation of the abdominal organs. Large left renal cyst measures 6.9 cm. Musculoskeletal: Multilevel  thoracic osteophytosis. No lytic or blastic lesions. Review of the MIP images confirms the above findings. IMPRESSION: 1. Small pulmonary embolus within the right lower lobe posterior basal segmental artery. No evidence of right heart strain. 2. Mildly enlarged main pulmonary artery, which may indicate underlying pulmonary hypertension. 3. Severe emphysema (ICD10-J43.9). Unchanged size of mass within the right upper lobe. 4.  Aortic Atherosclerosis (ICD10-I70.0). Electronically Signed   By: Cletus Gash.D.  On: 03/10/2017 03:16     Assessment and plan- Patient is a 76 y.o. male presents today to discuss emergency room findings (pulmonary embolism)and a new prescription of Eliquis.   1. Encouraged patient to take Eliquis as prescribed by ED Dr.   Visit Diagnosis No diagnosis found.  Patient expressed understanding and was in agreement with this plan. He also understands that He can call clinic at any time with any questions, concerns, or complaints.    Marisue Humble [facility] [phone] 03/11/2017 11:08 AM   This encounter was created in error - please disregard.

## 2017-03-11 NOTE — Progress Notes (Signed)
Radiation Oncology Follow up Note  Name: Chris Wilkinson   Date:   03/11/2017 MRN:  [id number] DOB: Jul 30, 1940    This 76 y.o. 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 Seen today in routine follow-up he is doing fairly well.. CT scan from yesterday does demonstrate a small pulmonary embolism the right lower lobe posteriorly fairly unchanged size of the mass within the right upper lobe although the right hilar fullness has been diminished. He continues to have a slight productive cough no hemoptysis. He's having bilateral lower back pain may be related to arthritis. He is currently on immunotherapy withdurvalumab which is being followed administered by medical oncology  COMPLICATIONS OF TREATMENT: none  FOLLOW UP COMPLIANCE: keeps appointments   PHYSICAL EXAM:  BP 129/84   Pulse (!) 121   Temp 99.2 F (37.3 C)   Wt 203 lb 7.8 oz (92.3 kg)   BMI 28.38 kg/m  Well-developed well-nourished patient in NAD. HEENT reveals PERLA, EOMI, discs not visualized.  Oral cavity is clear. No oral mucosal lesions are identified. Neck is clear without evidence of cervical or supraclavicular adenopathy. Lungs are clear to A&P. Cardiac examination is essentially unremarkable with regular rate and rhythm without murmur rub or thrill. Abdomen is benign with no organomegaly or masses noted. Motor sensory and DTR levels are equal and symmetric in the upper and lower extremities. Cranial nerves II through XII are grossly intact. Proprioception is intact. No peripheral adenopathy or edema is identified. No motor or sensory levels are noted. Crude visual  fields are within normal range.  RADIOLOGY RESULTS: Recent CT scan is reviewed and compatible with the above-stated findings  PLAN: Present time patient is doing well he currently is having symptoms related to immunotherapy and is currently ondurvalumab. He continues to improve with very little dysphasia. I've explained to him his cough and other symptoms as related to immunotherapy. Otherwise I've asked to see him back in 4 months for follow-up. He continues close follow-up therapy in immunotherapy treatment with medical oncology. Patient knows to call with any concerns.  I would like to take this opportunity to thank you for allowing me to participate in the care of your patient.Armstead Peaks., MD

## 2017-03-13 ENCOUNTER — Inpatient Hospital Stay: Payer: Medicare Other

## 2017-03-13 ENCOUNTER — Inpatient Hospital Stay (HOSPITAL_BASED_OUTPATIENT_CLINIC_OR_DEPARTMENT_OTHER): Payer: Medicare Other | Admitting: Oncology

## 2017-03-13 ENCOUNTER — Encounter: Payer: Self-pay | Admitting: Oncology

## 2017-03-13 VITALS — BP 108/72 | HR 102 | Temp 98.2°F | Resp 20 | Wt 204.1 lb

## 2017-03-13 DIAGNOSIS — C3411 Malignant neoplasm of upper lobe, right bronchus or lung: Secondary | ICD-10-CM

## 2017-03-13 DIAGNOSIS — Z9221 Personal history of antineoplastic chemotherapy: Secondary | ICD-10-CM

## 2017-03-13 DIAGNOSIS — C770 Secondary and unspecified malignant neoplasm of lymph nodes of head, face and neck: Secondary | ICD-10-CM

## 2017-03-13 DIAGNOSIS — Z923 Personal history of irradiation: Secondary | ICD-10-CM

## 2017-03-13 DIAGNOSIS — M7989 Other specified soft tissue disorders: Secondary | ICD-10-CM | POA: Diagnosis not present

## 2017-03-13 DIAGNOSIS — I2699 Other pulmonary embolism without acute cor pulmonale: Secondary | ICD-10-CM

## 2017-03-13 DIAGNOSIS — Z5111 Encounter for antineoplastic chemotherapy: Secondary | ICD-10-CM | POA: Diagnosis not present

## 2017-03-13 DIAGNOSIS — Z7901 Long term (current) use of anticoagulants: Secondary | ICD-10-CM | POA: Diagnosis not present

## 2017-03-13 DIAGNOSIS — Z79899 Other long term (current) drug therapy: Secondary | ICD-10-CM

## 2017-03-13 DIAGNOSIS — G893 Neoplasm related pain (acute) (chronic): Secondary | ICD-10-CM | POA: Diagnosis not present

## 2017-03-13 DIAGNOSIS — Z5112 Encounter for antineoplastic immunotherapy: Secondary | ICD-10-CM

## 2017-03-13 LAB — CBC WITH DIFFERENTIAL/PLATELET
Basophils Absolute: 0 10*3/uL (ref 0–0.1)
Basophils Relative: 0 %
Eosinophils Absolute: 0.1 10*3/uL (ref 0–0.7)
Eosinophils Relative: 4 %
HCT: 34.1 % — ABNORMAL LOW (ref 40.0–52.0)
Hemoglobin: 11.6 g/dL — ABNORMAL LOW (ref 13.0–18.0)
Lymphocytes Relative: 23 %
Lymphs Abs: 0.8 10*3/uL — ABNORMAL LOW (ref 1.0–3.6)
MCH: 31.1 pg (ref 26.0–34.0)
MCHC: 33.9 g/dL (ref 32.0–36.0)
MCV: 91.6 fL (ref 80.0–100.0)
Monocytes Absolute: 0.6 10*3/uL (ref 0.2–1.0)
Monocytes Relative: 17 %
Neutro Abs: 2 10*3/uL (ref 1.4–6.5)
Neutrophils Relative %: 56 %
Platelets: 160 10*3/uL (ref 150–440)
RBC: 3.72 MIL/uL — ABNORMAL LOW (ref 4.40–5.90)
RDW: 15.7 % — ABNORMAL HIGH (ref 11.5–14.5)
WBC: 3.5 10*3/uL — ABNORMAL LOW (ref 3.8–10.6)

## 2017-03-13 LAB — COMPREHENSIVE METABOLIC PANEL
ALT: 15 U/L — ABNORMAL LOW (ref 17–63)
AST: 18 U/L (ref 15–41)
Albumin: 3.2 g/dL — ABNORMAL LOW (ref 3.5–5.0)
Alkaline Phosphatase: 109 U/L (ref 38–126)
Anion gap: 7 (ref 5–15)
BUN: 22 mg/dL — ABNORMAL HIGH (ref 6–20)
CO2: 23 mmol/L (ref 22–32)
Calcium: 9.6 mg/dL (ref 8.9–10.3)
Chloride: 107 mmol/L (ref 101–111)
Creatinine, Ser: 1.47 mg/dL — ABNORMAL HIGH (ref 0.61–1.24)
GFR calc Af Amer: 52 mL/min — ABNORMAL LOW (ref >60)
GFR calc non Af Amer: 45 mL/min — ABNORMAL LOW (ref >60)
Glucose, Bld: 196 mg/dL — ABNORMAL HIGH (ref 65–99)
Potassium: 4.1 mmol/L (ref 3.5–5.1)
Sodium: 137 mmol/L (ref 135–145)
Total Bilirubin: 0.4 mg/dL (ref 0.3–1.2)
Total Protein: 8.2 g/dL — ABNORMAL HIGH (ref 6.5–8.1)

## 2017-03-13 MED ORDER — HEPARIN SOD (PORK) LOCK FLUSH 100 UNIT/ML IV SOLN
500.0000 [IU] | Freq: Once | INTRAVENOUS | Status: AC | PRN
  Administered 2017-03-13: 500 [IU]
  Filled 2017-03-13: qty 5

## 2017-03-13 MED ORDER — SODIUM CHLORIDE 0.9% FLUSH
10.0000 mL | INTRAVENOUS | Status: DC | PRN
  Filled 2017-03-13: qty 10

## 2017-03-13 MED ORDER — OXYCODONE HCL 10 MG PO TABS: 10.0000 mg | ORAL_TABLET | Freq: Four times a day (QID) | ORAL | 0 refills | Status: DC | PRN

## 2017-03-13 MED ORDER — SODIUM CHLORIDE 0.9 % IV SOLN
Freq: Once | INTRAVENOUS | Status: AC
  Administered 2017-03-13: 11:00:00 via INTRAVENOUS
  Filled 2017-03-13: qty 1000

## 2017-03-13 MED ORDER — SODIUM CHLORIDE 0.9 % IV SOLN
980.0000 mg | Freq: Once | INTRAVENOUS | Status: AC
  Administered 2017-03-13: 980 mg via INTRAVENOUS
  Filled 2017-03-13: qty 19.6

## 2017-03-13 NOTE — Progress Notes (Signed)
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 Patient is a 76 year old African-American male with a past medical history significant for COPD for which she sees Dr. Humphrey Rolls. He was recently admitted to the hospital in March 2018 with symptoms of pneumonia. CT thorax at that time showed: Perihilar lung mass with associated postobstructive atelectasis and consolidation of the right upper lobe  2. This was followed by a PET CT scan on 10/10/2016 which showed:1. Hypermetabolic RIGHT hilar mass with postobstructive collapse of the RIGHT upper lobe consistent with primary or metastatic bronchogenic carcinoma. 2. Hypermetabolic RIGHT paratracheal, prevascular and RIGHT supraclavicular nodal metastasis. 3. RIGHT supraclavicular node is intensely metabolic but likely too small for biopsy. 4. RIGHT lower lobe peripheral nodular thickening with associated metabolic activity is likely inflammatory. 5. Severe bullous change in the LEFT hemithorax  3. CT abdomen from 10/10/2016 showed no evidence of metastatic disease.  4. 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 Patient has problems with bilateral lower extremity swelling as well as shortness of breath on minimal exertion. He uses nighttime oxygen. He follows up with Dr. Floyce Stakes pulmonary as well as cardiology  6. Concurrent chemo/RT started on 11/13/16. Patient completed 5 weekly cycles of chemo/RT. He then had a gap of 3  weeks and restarted radiation boost for 3 weeks with concurrent chemo/RT. Interim scans after 5 weekly cycles showed partial response to treatment  Interval history- recently diagnosed with right lower lobe PE for which he is on eliquis. Reports sob is better. Still has chest wall pleuritic pain for which oxycodone helps. He has bowel movements Q2-3 days  ECOG PS- 1 Pain scale- 3 Opioid associated constipation- no  Review of systems- Review of Systems  Constitutional: Positive for malaise/fatigue. Negative for chills, fever and weight loss.  HENT: Negative for congestion, ear discharge and nosebleeds.   Eyes: Negative for blurred vision.  Respiratory: Negative for cough, hemoptysis, sputum production, shortness of breath and wheezing.   Cardiovascular: Negative for chest pain, palpitations, orthopnea and claudication.       Pleuritic pain  Gastrointestinal: Negative for abdominal pain, blood in stool, constipation, diarrhea, heartburn, melena, nausea and vomiting.  Genitourinary: Negative for dysuria, flank pain, frequency, hematuria and urgency.  Musculoskeletal: Negative for back pain, joint pain and myalgias.  Skin: Negative for rash.  Neurological: Negative for dizziness, tingling, focal weakness, seizures, weakness and headaches.  Endo/Heme/Allergies: Does not bruise/bleed easily.  Psychiatric/Behavioral: Negative for depression and suicidal ideas. The patient does not have insomnia.      No Known Allergies   Past Medical History:  Diagnosis Date  . Cancer ([place])    Lung CA  . CHF (congestive heart failure) ([place])   . COPD (chronic obstructive pulmonary disease) ([place])   . Coronary artery disease   . Diabetes mellitus without complication ([place])   . Fibromyalgia   . GERD (gastroesophageal reflux disease)   . Hypertension   . Pulmonary emboli [facility])      Past Surgical History:  Procedure Laterality Date  . COLONOSCOPY  WITH PROPOFOL N/A 01/18/2015   Procedure: COLONOSCOPY  WITH PROPOFOL;  Surgeon: Manya Silvas, MD;  Location: [facility] ENDOSCOPY;  Service: Endoscopy;  Laterality: N/A;  . ESOPHAGOGASTRODUODENOSCOPY N/A 01/18/2015   Procedure: ESOPHAGOGASTRODUODENOSCOPY (EGD);  Surgeon: Manya Silvas, MD;  Location: [facility] ENDOSCOPY;  Service: Endoscopy;  Laterality: N/A;  . EYE SURGERY    . FLEXIBLE BRONCHOSCOPY N/A 10/22/2016   Procedure: FLEXIBLE BRONCHOSCOPY;  Surgeon: Allyne Gee, MD;  Location: [facility] ORS;  Service: Pulmonary;  Laterality: N/A;  . HERNIA REPAIR    . IR FLUORO GUIDE PORT INSERTION LEFT  11/07/2016  . REPAIR KNEE LIGAMENT    . TONSILLECTOMY      Social History   Social History  . Marital status: Married    Spouse name: N/A  . Number of children: N/A  . Years of education: N/A   Occupational History  . Not on file.   Social History Main Topics  . Smoking status: Former Smoker    Quit date: 12/01/2003  . Smokeless tobacco: Never Used  . Alcohol use 1.2 oz/week    2 Shots of liquor per week     Comment: on the weeknd 2 shots  . Drug use: No  . Sexual activity: Not on file   Other Topics Concern  . Not on file   Social History Narrative  . No narrative on file    Family History  Problem Relation Age of Onset  . Prostate cancer Father   . Prostate cancer Brother   . Diabetes Unknown        all 13 siblings  . Hypertension Unknown        all 13 siblings     Current Outpatient Prescriptions:  .  apixaban (ELIQUIS STARTER PACK) 5 MG TABS tablet, Take 2 tablets (10 mg total) by mouth 2 (two) times daily. For 7 days and then 40m daily, Disp: 74 tablet, Rfl: 0 .  aspirin 325 MG tablet, Take 325 mg by mouth daily., Disp: , Rfl:  .  carvedilol (COREG) 3.125 MG tablet, Take 3.125 mg by mouth 2 (two) times daily with a meal., Disp: , Rfl:  .  furosemide (LASIX) 40 MG tablet, Take 40 mg by mouth daily., Disp: , Rfl:  .  lidocaine-prilocaine (EMLA) cream, Apply to affected area once, Disp: 30 g, Rfl: 3 .  losartan (COZAAR) 100 MG  tablet, Take 100 mg by mouth daily., Disp: , Rfl:  .  metFORMIN (GLUCOPHAGE) 500 MG tablet, Take 500 mg by mouth 2 (two) times daily. , Disp: , Rfl:  .  simvastatin (ZOCOR) 10 MG tablet, Take 10 mg by mouth every evening. , Disp: , Rfl:  .  sitaGLIPtin (JANUVIA) 50 MG tablet, Take 50 mg by mouth daily., Disp: , Rfl:  .  acetaminophen (TYLENOL) 325 MG tablet, Take 2 tablets (650 mg total) by mouth every 6 (six) hours as needed for mild pain (or Fever >/= 101). (Patient not taking: Reported on 03/13/2017), Disp: , Rfl:  .  albuterol-ipratropium (COMBIVENT) 18-103 MCG/ACT inhaler, Inhale 2 puffs into the lungs every 6 (six) hours as needed for wheezing or shortness of breath., Disp: , Rfl:  .  benzonatate (TESSALON) 200 MG capsule, Take 1 capsule (200 mg total) by mouth 3 (three) times daily as needed for cough. (Patient not taking: Reported on 03/13/2017), Disp: 20 capsule, Rfl: 0 .  Fluticasone Furoate-Vilanterol 100-25 MCG/INH AEPB, Inhale 1 puff into the lungs daily. , Disp: , Rfl:  .  omeprazole (PRILOSEC)  40 MG capsule, Take 40 mg by mouth daily., Disp: , Rfl:  .  Oxycodone HCl 10 MG TABS, Take 1 tablet (10 mg total) by mouth every 6 (six) hours as needed., Disp: 30 tablet, Rfl: 0 .  sucralfate (CARAFATE) 1 g tablet, Take 1 tablet (1 g total) by mouth 3 (three) times daily. Dissolve in 2-3 tbsp warm water, swish and swallow. (Patient not taking: Reported on 02/13/2017), Disp: 90 tablet, Rfl: 3 No current facility-administered medications for this visit.   Facility-Administered Medications Ordered in Other Visits:  .  durvalumab (IMFINZI) 980 mg in sodium chloride 0.9 % 100 mL chemo infusion, 980 mg, Intravenous, Once, Randa Evens C, MD .  heparin lock flush 100 unit/mL, 500 Units, Intracatheter, Once PRN, Sindy Guadeloupe, MD .  sodium chloride flush (NS) 0.9 % injection 10 mL, 10 mL, Intravenous, PRN, Sindy Guadeloupe, MD, 10 mL at 01/30/17 9450 .  sodium chloride flush (NS) 0.9 % injection 10 mL, 10  mL, Intracatheter, PRN, Sindy Guadeloupe, MD  Physical exam:  Vitals:   03/13/17 1023  BP: 108/72  Pulse: (!) 102  Resp: 20  Temp: 98.2 F (36.8 C)  TempSrc: Tympanic  Weight: 204 lb 1.6 oz (92.6 kg)   Physical Exam  Constitutional: He is oriented to person, place, and time and well-developed, well-nourished, and in no distress.  HENT:  Head: Normocephalic and atraumatic.  Eyes: Pupils are equal, round, and reactive to light. EOM are normal.  Neck: Normal range of motion.  Cardiovascular: Normal rate, regular rhythm and normal heart sounds.   Pulmonary/Chest: Effort normal and breath sounds normal.  Abdominal: Soft. Bowel sounds are normal.  Neurological: He is alert and oriented to person, place, and time.  Skin: Skin is warm and dry.     CMP Latest Ref Rng & Units 03/13/2017  Glucose 65 - 99 mg/dL 196(H)  BUN 6 - 20 mg/dL 22(H)  Creatinine 0.61 - 1.24 mg/dL 1.47(H)  Sodium 135 - 145 mmol/L 137  Potassium 3.5 - 5.1 mmol/L 4.1  Chloride 101 - 111 mmol/L 107  CO2 22 - 32 mmol/L 23  Calcium 8.9 - 10.3 mg/dL 9.6  Total Protein 6.5 - 8.1 g/dL 8.2(H)  Total Bilirubin 0.3 - 1.2 mg/dL 0.4  Alkaline Phos 38 - 126 U/L 109  AST 15 - 41 U/L 18  ALT 17 - 63 U/L 15(L)   CBC Latest Ref Rng & Units 03/13/2017  WBC 3.8 - 10.6 K/uL 3.5(L)  Hemoglobin 13.0 - 18.0 g/dL 11.6(L)  Hematocrit 40.0 - 52.0 % 34.1(L)  Platelets 150 - 440 K/uL 160    No images are attached to the encounter.  Dg Chest 2 View  Result Date: 03/10/2017 CLINICAL DATA:  Right-sided chest pain for 2 days. EXAM: CHEST  2 VIEW COMPARISON:  11/30/2016, 12/31/2016. FINDINGS: Left jugular port remain satisfactorily positioned with tip at the expected location of the superior cavoatrial junction. 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 Mild central right upper lobe opacity is unchanged or mildly reduced. No new airspace consolidation. No evidence of pneumothorax. No pleural effusion. IMPRESSION: Severe bullous  disease. Mild central right upper lobe opacity is unchanged or slightly reduced from 11/30/2016. Electronically Signed   By: Andreas Newport M.D.   On: 03/10/2017 01:41   Ct Angio Chest Pe W And/or Wo Contrast  Result Date: 03/10/2017 CLINICAL DATA:  Chest pain with positive D-dimer.  Lung cancer. EXAM: CT ANGIOGRAPHY CHEST WITH CONTRAST TECHNIQUE: Multidetector CT imaging of the  chest was performed using the standard protocol during bolus administration of intravenous contrast. Multiplanar CT image reconstructions and MIPs were obtained to evaluate the vascular anatomy. CONTRAST:  60 mL Isovue 370 COMPARISON:  Chest radiograph 03/10/2017 Chest CT 12/31/2016 FINDINGS: Cardiovascular: Contrast injection is sufficient to demonstrate satisfactory opacification of the pulmonary arteries to the segmental level. There is a small pulmonary embolus within the right lower lobe posterior basal segmental artery (series 7 images 219-226). The main pulmonary artery is mildly enlarged, measuring 3.3 cm at the bifurcation. There is no CT evidence of acute right heart strain. There is calcific aortic atherosclerosis. There is a normal 3-vessel arch branching pattern. Heart size is normal, without pericardial effusion. Mediastinum/Nodes: Unchanged right hilar lymphadenopathy. The visualized thyroid and thoracic esophageal course are unremarkable. Lungs/Pleura: There is severe emphysema with multiple large bullae at the lung apices. There is fibrotic change of the posterior right upper lobe. Findings are likely secondary to prior radiation. In the right upper lobe, there is a 3 cm mass, which is approximately unchanged in size. Upper Abdomen: Contrast bolus timing is not optimized for evaluation of the abdominal organs. Large left renal cyst measures 6.9 cm. Musculoskeletal: Multilevel thoracic osteophytosis. No lytic or blastic lesions. Review of the MIP images confirms the above findings. IMPRESSION: 1. Small pulmonary  embolus within the right lower lobe posterior basal segmental artery. No evidence of right heart strain. 2. Mildly enlarged main pulmonary artery, which may indicate underlying pulmonary hypertension. 3. Severe emphysema (ICD10-J43.9). Unchanged size of mass within the right upper lobe. 4.  Aortic Atherosclerosis (ICD10-I70.0). Electronically Signed   By: Ulyses Jarred M.D.   On: 03/10/2017 03:16     Assessment and plan- Patient is a 76 y.o. male with SCC of RUL Stage IIIB T1N3M0. He has completed concurrent chemo/RT with partial response. He is here for on treatment assessment prior to cycle 2 of durvalumab  Cycle 2 of durvalumabwas not given 2 weeks ago because of symptoms of shortness of breath. He was ultimately diagnosed to have a small right-sided PE for which he is on eliquis currently. His shortness of breath is improved. He does have some mild pleuritic chest wall pain but otherwise doing well. Counts are okay to proceed with cycle 2 of maintenance durvalumab today. I will see him back in 2 weeks' time with repeat CBC and CMP for cycle 3   Chest wall pain- likely due to PE/ lung cancer- refill for oxycodone given   Visit Diagnosis 1. Malignant neoplasm of upper lobe of right lung ([place])   2. Encounter for antineoplastic immunotherapy   3. Other acute pulmonary embolism without acute cor pulmonale (HCC)      Dr. Randa Evens, MD, MPH [facility] at [facility] Pager- [phone] 03/13/2017 11:47 AM

## 2017-03-13 NOTE — Progress Notes (Signed)
Here for follow up. Per pt on 03/10/17 went to ER w pain on inspiration ( bilateral mid back/rib cage area ) per pt they " took a scan and x ray " and found to have "small clot R Lower lobe lung " -per pt  Started on new anti coagulant. Refd to stay in hospital and wanted to be "checked by Dr Janese Banks and Dr Humphrey Rolls " stated he continues to have pain on inspiration ( bilateral lower lobes-"around my back area " per pt )  Pulse ox 95 % on RA

## 2017-03-27 ENCOUNTER — Inpatient Hospital Stay (HOSPITAL_BASED_OUTPATIENT_CLINIC_OR_DEPARTMENT_OTHER): Payer: Medicare Other | Admitting: Oncology

## 2017-03-27 ENCOUNTER — Inpatient Hospital Stay: Payer: Medicare Other

## 2017-03-27 ENCOUNTER — Inpatient Hospital Stay: Payer: Medicare Other | Attending: Oncology

## 2017-03-27 ENCOUNTER — Encounter: Payer: Self-pay | Admitting: Oncology

## 2017-03-27 VITALS — BP 118/81 | HR 94 | Temp 98.3°F | Resp 20

## 2017-03-27 VITALS — BP 99/66 | HR 114 | Temp 97.9°F | Wt 203.0 lb

## 2017-03-27 DIAGNOSIS — I2510 Atherosclerotic heart disease of native coronary artery without angina pectoris: Secondary | ICD-10-CM | POA: Insufficient documentation

## 2017-03-27 DIAGNOSIS — C3411 Malignant neoplasm of upper lobe, right bronchus or lung: Secondary | ICD-10-CM | POA: Diagnosis not present

## 2017-03-27 DIAGNOSIS — I700 Atherosclerosis of aorta: Secondary | ICD-10-CM | POA: Diagnosis not present

## 2017-03-27 DIAGNOSIS — Z7984 Long term (current) use of oral hypoglycemic drugs: Secondary | ICD-10-CM | POA: Insufficient documentation

## 2017-03-27 DIAGNOSIS — I2699 Other pulmonary embolism without acute cor pulmonale: Secondary | ICD-10-CM | POA: Insufficient documentation

## 2017-03-27 DIAGNOSIS — Z5111 Encounter for antineoplastic chemotherapy: Secondary | ICD-10-CM | POA: Diagnosis not present

## 2017-03-27 DIAGNOSIS — Z87891 Personal history of nicotine dependence: Secondary | ICD-10-CM | POA: Diagnosis not present

## 2017-03-27 DIAGNOSIS — J449 Chronic obstructive pulmonary disease, unspecified: Secondary | ICD-10-CM | POA: Diagnosis not present

## 2017-03-27 DIAGNOSIS — I509 Heart failure, unspecified: Secondary | ICD-10-CM | POA: Diagnosis not present

## 2017-03-27 DIAGNOSIS — C770 Secondary and unspecified malignant neoplasm of lymph nodes of head, face and neck: Secondary | ICD-10-CM

## 2017-03-27 DIAGNOSIS — Z923 Personal history of irradiation: Secondary | ICD-10-CM

## 2017-03-27 DIAGNOSIS — N281 Cyst of kidney, acquired: Secondary | ICD-10-CM | POA: Insufficient documentation

## 2017-03-27 DIAGNOSIS — Z9981 Dependence on supplemental oxygen: Secondary | ICD-10-CM | POA: Diagnosis not present

## 2017-03-27 DIAGNOSIS — E119 Type 2 diabetes mellitus without complications: Secondary | ICD-10-CM | POA: Diagnosis not present

## 2017-03-27 DIAGNOSIS — M797 Fibromyalgia: Secondary | ICD-10-CM | POA: Insufficient documentation

## 2017-03-27 DIAGNOSIS — Z7982 Long term (current) use of aspirin: Secondary | ICD-10-CM | POA: Insufficient documentation

## 2017-03-27 DIAGNOSIS — Z7901 Long term (current) use of anticoagulants: Secondary | ICD-10-CM

## 2017-03-27 DIAGNOSIS — Z23 Encounter for immunization: Secondary | ICD-10-CM | POA: Diagnosis not present

## 2017-03-27 DIAGNOSIS — R000 Tachycardia, unspecified: Secondary | ICD-10-CM | POA: Insufficient documentation

## 2017-03-27 DIAGNOSIS — K219 Gastro-esophageal reflux disease without esophagitis: Secondary | ICD-10-CM | POA: Insufficient documentation

## 2017-03-27 DIAGNOSIS — Z79899 Other long term (current) drug therapy: Secondary | ICD-10-CM | POA: Diagnosis not present

## 2017-03-27 DIAGNOSIS — I110 Hypertensive heart disease with heart failure: Secondary | ICD-10-CM | POA: Insufficient documentation

## 2017-03-27 DIAGNOSIS — Z5112 Encounter for antineoplastic immunotherapy: Secondary | ICD-10-CM

## 2017-03-27 LAB — COMPREHENSIVE METABOLIC PANEL
ALBUMIN: 3.2 g/dL — AB (ref 3.5–5.0)
ALK PHOS: 98 U/L (ref 38–126)
ALT: 17 U/L (ref 17–63)
ANION GAP: 9 (ref 5–15)
AST: 18 U/L (ref 15–41)
BUN: 16 mg/dL (ref 6–20)
CALCIUM: 9.9 mg/dL (ref 8.9–10.3)
CO2: 23 mmol/L (ref 22–32)
Chloride: 106 mmol/L (ref 101–111)
Creatinine, Ser: 1.32 mg/dL — ABNORMAL HIGH (ref 0.61–1.24)
GFR, EST AFRICAN AMERICAN: 59 mL/min — AB (ref >60)
GFR, EST NON AFRICAN AMERICAN: 51 mL/min — AB (ref >60)
Glucose, Bld: 145 mg/dL — ABNORMAL HIGH (ref 65–99)
Potassium: 4 mmol/L (ref 3.5–5.1)
SODIUM: 138 mmol/L (ref 135–145)
TOTAL PROTEIN: 8.1 g/dL (ref 6.5–8.1)
Total Bilirubin: 0.6 mg/dL (ref 0.3–1.2)

## 2017-03-27 LAB — CBC WITH DIFFERENTIAL/PLATELET
BASOS PCT: 1 %
Basophils Absolute: 0 10*3/uL (ref 0–0.1)
EOS ABS: 0.2 10*3/uL (ref 0–0.7)
Eosinophils Relative: 4 %
HEMATOCRIT: 34.8 % — AB (ref 40.0–52.0)
HEMOGLOBIN: 11.9 g/dL — AB (ref 13.0–18.0)
LYMPHS ABS: 0.9 10*3/uL — AB (ref 1.0–3.6)
Lymphocytes Relative: 20 %
MCH: 31 pg (ref 26.0–34.0)
MCHC: 34.2 g/dL (ref 32.0–36.0)
MCV: 90.6 fL (ref 80.0–100.0)
MONOS PCT: 20 %
Monocytes Absolute: 0.8 10*3/uL (ref 0.2–1.0)
NEUTROS ABS: 2.4 10*3/uL (ref 1.4–6.5)
NEUTROS PCT: 55 %
Platelets: 187 10*3/uL (ref 150–440)
RBC: 3.84 MIL/uL — AB (ref 4.40–5.90)
RDW: 15.1 % — ABNORMAL HIGH (ref 11.5–14.5)
WBC: 4.3 10*3/uL (ref 3.8–10.6)

## 2017-03-27 MED ORDER — SODIUM CHLORIDE 0.9 % IV SOLN
980.0000 mg | Freq: Once | INTRAVENOUS | Status: AC
  Administered 2017-03-27: 980 mg via INTRAVENOUS
  Filled 2017-03-27: qty 10

## 2017-03-27 MED ORDER — SODIUM CHLORIDE 0.9 % IV SOLN
Freq: Once | INTRAVENOUS | Status: AC
  Administered 2017-03-27: 11:00:00 via INTRAVENOUS
  Filled 2017-03-27: qty 1000

## 2017-03-27 MED ORDER — HEPARIN SOD (PORK) LOCK FLUSH 100 UNIT/ML IV SOLN
500.0000 [IU] | Freq: Once | INTRAVENOUS | Status: AC | PRN
  Administered 2017-03-27: 500 [IU]

## 2017-03-27 NOTE — Progress Notes (Signed)
Patient here today for follow up.  Patient states no new concerns today  

## 2017-03-27 NOTE — Progress Notes (Addendum)
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 He follows up with Dr.Rao who is on vacation today.   Heme/Onc history:1. Patient is a 76 year old African-American male with a past medical history significant for COPD for which she sees Dr. Humphrey Rolls. He was recently admitted to the hospital in March 2018 with symptoms of pneumonia. CT thorax at that time showed: Perihilar lung mass with associated postobstructive atelectasis and consolidation of the right upper lobe  2. This was followed by a PET CT scan on 10/10/2016 which showed:1. Hypermetabolic RIGHT hilar mass with postobstructive collapse of the RIGHT upper lobe consistent with primary or metastatic bronchogenic carcinoma. 2. Hypermetabolic RIGHT paratracheal, prevascular and RIGHT supraclavicular nodal metastasis. 3. RIGHT supraclavicular node is intensely metabolic but likely too small for biopsy. 4. RIGHT lower lobe peripheral nodular thickening with associated metabolic activity is likely inflammatory. 5. Severe bullous change in the LEFT hemithorax  3. CT abdomen from 10/10/2016 showed no evidence of metastatic disease.  4. 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 Patient has problems with bilateral lower extremity swelling as well as shortness of breath on minimal exertion. He uses nighttime oxygen. He follows up with Dr. Floyce Stakes pulmonary as well as cardiology  6. Concurrent chemo/RT started on 11/13/16. Patient  completed 5 weekly cycles of chemo/RT. He then had a gap of 3 weeks and restarted radiation boost for 3 weeks with concurrent chemo/RT. Interim scans after 5 weekly cycles showed partial response to treatment  7 Recently diagnosed with right lower lobe PE for which he is on Eliquis.   Interval history-   Patient is here for evaluation prior to maintenance cycle 3 durvalumab treatment. He recently had PE and is on Eliquis. Denies bleeding events. His SOB level is at baseline. He has oxygen at home and he does not use often. Today's pulse ox is 92%.     ECOG PS- 1 Pain scale- 3 Opioid associated constipation- no  Review of systems- Review of Systems  Constitutional: Positive for malaise/fatigue. Negative for chills, fever and weight loss.  HENT: Negative for congestion, ear discharge and nosebleeds.   Eyes: Negative for blurred vision.  Respiratory: Negative for cough, hemoptysis, sputum production, shortness of breath and wheezing.   Cardiovascular: Negative for chest pain, palpitations, orthopnea and claudication.       Pleuritic pain  Gastrointestinal: Negative for abdominal pain, blood in stool, constipation, diarrhea, heartburn, melena, nausea and vomiting.  Genitourinary: Negative for dysuria, flank pain, frequency, hematuria and urgency.  Musculoskeletal: Negative for back pain, joint pain and myalgias.  Skin: Negative for rash.  Neurological: Negative for dizziness, tingling, focal weakness, seizures, weakness and headaches.  Endo/Heme/Allergies: Does not bruise/bleed easily.  Psychiatric/Behavioral: Negative for depression and suicidal ideas. The patient does not have insomnia.      No Known Allergies   Past Medical History:  Diagnosis Date  . Cancer ([place])    Lung CA  . CHF (congestive heart failure) ([place])   . COPD (chronic obstructive pulmonary disease) ([place])   . Coronary artery  disease   . Diabetes mellitus without complication ([place])   . Fibromyalgia   . GERD  (gastroesophageal reflux disease)   . Hypertension   . Pulmonary emboli [facility])      Past Surgical History:  Procedure Laterality Date  . COLONOSCOPY WITH PROPOFOL N/A 01/18/2015   Procedure: COLONOSCOPY WITH PROPOFOL;  Surgeon: Manya Silvas, MD;  Location: [facility] ENDOSCOPY;  Service: Endoscopy;  Laterality: N/A;  . ESOPHAGOGASTRODUODENOSCOPY N/A 01/18/2015   Procedure: ESOPHAGOGASTRODUODENOSCOPY (EGD);  Surgeon: Manya Silvas, MD;  Location: [facility] ENDOSCOPY;  Service: Endoscopy;  Laterality: N/A;  . EYE SURGERY    . FLEXIBLE BRONCHOSCOPY N/A 10/22/2016   Procedure: FLEXIBLE BRONCHOSCOPY;  Surgeon: Allyne Gee, MD;  Location: [facility] ORS;  Service: Pulmonary;  Laterality: N/A;  . HERNIA REPAIR    . IR FLUORO GUIDE PORT INSERTION LEFT  11/07/2016  . REPAIR KNEE LIGAMENT    . TONSILLECTOMY      Social History   Social History  . Marital status: Married    Spouse name: N/A  . Number of children: N/A  . Years of education: N/A   Occupational History  . Not on file.   Social History Main Topics  . Smoking status: Former Smoker    Quit date: 12/01/2003  . Smokeless tobacco: Never Used  . Alcohol use 1.2 oz/week    2 Shots of liquor per week     Comment: on the weeknd 2 shots  . Drug use: No  . Sexual activity: Not on file   Other Topics Concern  . Not on file   Social History Narrative  . No narrative on file    Family History  Problem Relation Age of Onset  . Prostate cancer Father   . Prostate cancer Brother   . Diabetes Unknown        all 13 siblings  . Hypertension Unknown        all 13 siblings     Current Outpatient Prescriptions:  .  acetaminophen (TYLENOL) 325 MG tablet, Take 2 tablets (650 mg total) by mouth every 6 (six) hours as needed for mild pain (or Fever >/= 101)., Disp: , Rfl:  .  albuterol-ipratropium (COMBIVENT) 18-103 MCG/ACT inhaler, Inhale 2 puffs into the lungs every 6 (six) hours as needed for wheezing or shortness of breath., Disp: , Rfl:  .   apixaban (ELIQUIS STARTER PACK) 5 MG TABS tablet, Take 2 tablets (10 mg total) by mouth 2 (two) times daily. For 7 days and then 42m daily, Disp: 74 tablet, Rfl: 0 .  aspirin 325 MG tablet, Take 325 mg by mouth daily., Disp: , Rfl:  .  carvedilol (COREG) 3.125 MG tablet, Take 3.125 mg by mouth 2 (two) times daily with a meal., Disp: , Rfl:  .  Fluticasone Furoate-Vilanterol 100-25 MCG/INH AEPB, Inhale 1 puff into the lungs daily. , Disp: , Rfl:  .  furosemide (LASIX) 40 MG tablet, Take 40 mg by mouth daily., Disp: , Rfl:  .  lidocaine-prilocaine (EMLA) cream, Apply to affected area once, Disp: 30 g, Rfl: 3 .  losartan (COZAAR) 100 MG tablet, Take 100 mg by mouth daily., Disp: , Rfl:  .  metFORMIN (GLUCOPHAGE) 500 MG tablet, Take 500 mg by mouth 2 (two) times daily. , Disp: , Rfl:  .  omeprazole (PRILOSEC) 40 MG capsule, Take 40 mg by mouth daily., Disp: , Rfl:  .  Oxycodone HCl 10 MG TABS, Take 1 tablet (10 mg total) by mouth every 6 (six)  hours as needed., Disp: 30 tablet, Rfl: 0 .  simvastatin (ZOCOR) 10 MG tablet, Take 10 mg by mouth every evening. , Disp: , Rfl:  .  sitaGLIPtin (JANUVIA) 50 MG tablet, Take 50 mg by mouth daily., Disp: , Rfl:  .  sucralfate (CARAFATE) 1 g tablet, Take 1 tablet (1 g total) by mouth 3 (three) times daily. Dissolve in 2-3 tbsp warm water, swish and swallow., Disp: 90 tablet, Rfl: 3 No current facility-administered medications for this visit.   Facility-Administered Medications Ordered in Other Visits:  .  sodium chloride flush (NS) 0.9 % injection 10 mL, 10 mL, Intravenous, PRN, Sindy Guadeloupe, MD, 10 mL at 01/30/17 0958  Physical exam:  Vitals:   03/27/17 1428  BP: 99/66  Pulse: (!) 114  Temp: 97.9 F (36.6 C)   Physical Exam  Constitutional: He is oriented to person, place, and time and well-developed, well-nourished, and in no distress.  HENT:  Head: Normocephalic and atraumatic.  Eyes: Pupils are equal, round, and reactive to light. EOM are normal.   Neck: Normal range of motion.  Cardiovascular: Normal rate, regular rhythm and normal heart sounds.   Pulmonary/Chest: Effort normal and breath sounds normal.  Abdominal: Soft. Bowel sounds are normal.  Neurological: He is alert and oriented to person, place, and time.  Skin: Skin is warm and dry.     CMP Latest Ref Rng & Units 03/27/2017  Glucose 65 - 99 mg/dL 145(H)  BUN 6 - 20 mg/dL 16  Creatinine 0.61 - 1.24 mg/dL 1.32(H)  Sodium 135 - 145 mmol/L 138  Potassium 3.5 - 5.1 mmol/L 4.0  Chloride 101 - 111 mmol/L 106  CO2 22 - 32 mmol/L 23  Calcium 8.9 - 10.3 mg/dL 9.9  Total Protein 6.5 - 8.1 g/dL 8.1  Total Bilirubin 0.3 - 1.2 mg/dL 0.6  Alkaline Phos 38 - 126 U/L 98  AST 15 - 41 U/L 18  ALT 17 - 63 U/L 17   CBC Latest Ref Rng & Units 03/27/2017  WBC 3.8 - 10.6 K/uL 4.3  Hemoglobin 13.0 - 18.0 g/dL 11.9(L)  Hematocrit 40.0 - 52.0 % 34.8(L)  Platelets 150 - 440 K/uL 187    No images are attached to the encounter.  Dg Chest 2 View  Result Date: 03/10/2017 CLINICAL DATA:  Right-sided chest pain for 2 days. EXAM: CHEST  2 VIEW COMPARISON:  11/30/2016, 12/31/2016. FINDINGS: Left jugular port remain satisfactorily positioned with tip at the expected location of the superior cavoatrial junction. 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 Mild central right upper lobe opacity is unchanged or mildly reduced. No new airspace consolidation. No evidence of pneumothorax. No pleural effusion. IMPRESSION: Severe bullous disease. Mild central right upper lobe opacity is unchanged or slightly reduced from 11/30/2016. Electronically Signed   By: Andreas Newport M.D.   On: 03/10/2017 01:41   Ct Angio Chest Pe W And/or Wo Contrast  Result Date: 03/10/2017 CLINICAL DATA:  Chest pain with positive D-dimer.  Lung cancer. EXAM: CT ANGIOGRAPHY CHEST WITH CONTRAST TECHNIQUE: Multidetector CT imaging of the chest was performed using the standard protocol during bolus administration of intravenous  contrast. Multiplanar CT image reconstructions and MIPs were obtained to evaluate the vascular anatomy. CONTRAST:  60 mL Isovue 370 COMPARISON:  Chest radiograph 03/10/2017 Chest CT 12/31/2016 FINDINGS: Cardiovascular: Contrast injection is sufficient to demonstrate satisfactory opacification of the pulmonary arteries to the segmental level. There is a small pulmonary embolus within the right lower lobe posterior basal segmental artery (  series 7 images 219-226). The main pulmonary artery is mildly enlarged, measuring 3.3 cm at the bifurcation. There is no CT evidence of acute right heart strain. There is calcific aortic atherosclerosis. There is a normal 3-vessel arch branching pattern. Heart size is normal, without pericardial effusion. Mediastinum/Nodes: Unchanged right hilar lymphadenopathy. The visualized thyroid and thoracic esophageal course are unremarkable. Lungs/Pleura: There is severe emphysema with multiple large bullae at the lung apices. There is fibrotic change of the posterior right upper lobe. Findings are likely secondary to prior radiation. In the right upper lobe, there is a 3 cm mass, which is approximately unchanged in size. Upper Abdomen: Contrast bolus timing is not optimized for evaluation of the abdominal organs. Large left renal cyst measures 6.9 cm. Musculoskeletal: Multilevel thoracic osteophytosis. No lytic or blastic lesions. Review of the MIP images confirms the above findings. IMPRESSION: 1. Small pulmonary embolus within the right lower lobe posterior basal segmental artery. No evidence of right heart strain. 2. Mildly enlarged main pulmonary artery, which may indicate underlying pulmonary hypertension. 3. Severe emphysema (ICD10-J43.9). Unchanged size of mass within the right upper lobe. 4.  Aortic Atherosclerosis (ICD10-I70.0). Electronically Signed   By: Ulyses Jarred M.D.   On: 03/10/2017 03:16     Assessment and plan- Patient is a 76 y.o. male with SCC of RUL Stage IIIB  T1N3M0. He has completed concurrent chemo/RT with partial response. He is here for on treatment assessment prior to cycle 3 of durvalumab  1. Malignant neoplasm of upper lobe of right lung ([place])   2. Encounter for antineoplastic immunotherapy   3. Other pulmonary embolism without acute cor pulmonale, unspecified chronicity ([place])   4. Tachycardia   I am covering Dr.Rao to see this patient today.  # Proceed with cycle 3 Drvalumab today. He tolerates treatment well.  #Continue Eliquis 56m BID, tolerates well. He is also on Aspirin 3275mwhich was prescribed by cardiologist. I encourage him to discuss with cardio to see if Aspirin is needed given that he is now on chronic anticoagulation.   # Chest wall pain- likely due to PE/ lung cancer-on oxycodone PRN # Follow up with cardiology he has appointment next Monday.  Follow up in 2 weeks with Dr.Rao for cycle 4 treatment with cbc, cmp  Dr. ZhEarlie ServerMD, PhD CHBanner Iron[facility] Cent[facility] Hospital[facility]- [phone]/14/2018

## 2017-04-02 ENCOUNTER — Telehealth: Payer: Self-pay | Admitting: *Deleted

## 2017-04-02 NOTE — Telephone Encounter (Signed)
Dr. Janese Banks wanted me to call MD office and let them know that pt had PE and is now on eliquis and that she wondered if the patient needed to stay on ASa full dose. I called Dr.  Allyson Sabal office and left message about above and got a call back from his staff and he states that he would still like to keep pt on full strength ASA because of his CAD.

## 2017-04-03 ENCOUNTER — Other Ambulatory Visit: Payer: Self-pay | Admitting: *Deleted

## 2017-04-03 DIAGNOSIS — C3411 Malignant neoplasm of upper lobe, right bronchus or lung: Secondary | ICD-10-CM

## 2017-04-03 MED ORDER — OXYCODONE HCL 10 MG PO TABS: 10.0000 mg | ORAL_TABLET | Freq: Four times a day (QID) | ORAL | 0 refills | Status: DC | PRN

## 2017-04-10 ENCOUNTER — Inpatient Hospital Stay: Payer: Medicare Other

## 2017-04-10 ENCOUNTER — Encounter: Payer: Self-pay | Admitting: Oncology

## 2017-04-10 ENCOUNTER — Inpatient Hospital Stay (HOSPITAL_BASED_OUTPATIENT_CLINIC_OR_DEPARTMENT_OTHER): Payer: Medicare Other | Admitting: Oncology

## 2017-04-10 VITALS — BP 130/74 | HR 106 | Temp 99.4°F | Resp 22 | Wt 203.6 lb

## 2017-04-10 DIAGNOSIS — C770 Secondary and unspecified malignant neoplasm of lymph nodes of head, face and neck: Secondary | ICD-10-CM

## 2017-04-10 DIAGNOSIS — Z79899 Other long term (current) drug therapy: Secondary | ICD-10-CM | POA: Diagnosis not present

## 2017-04-10 DIAGNOSIS — C3411 Malignant neoplasm of upper lobe, right bronchus or lung: Secondary | ICD-10-CM | POA: Diagnosis not present

## 2017-04-10 DIAGNOSIS — I2699 Other pulmonary embolism without acute cor pulmonale: Secondary | ICD-10-CM

## 2017-04-10 DIAGNOSIS — Z5111 Encounter for antineoplastic chemotherapy: Secondary | ICD-10-CM | POA: Diagnosis not present

## 2017-04-10 DIAGNOSIS — Z5112 Encounter for antineoplastic immunotherapy: Secondary | ICD-10-CM

## 2017-04-10 DIAGNOSIS — R000 Tachycardia, unspecified: Secondary | ICD-10-CM

## 2017-04-10 DIAGNOSIS — Z923 Personal history of irradiation: Secondary | ICD-10-CM

## 2017-04-10 DIAGNOSIS — Z23 Encounter for immunization: Secondary | ICD-10-CM

## 2017-04-10 DIAGNOSIS — Z7901 Long term (current) use of anticoagulants: Secondary | ICD-10-CM | POA: Diagnosis not present

## 2017-04-10 LAB — COMPREHENSIVE METABOLIC PANEL
ALBUMIN: 3.2 g/dL — AB (ref 3.5–5.0)
ALT: 14 U/L — ABNORMAL LOW (ref 17–63)
ANION GAP: 6 (ref 5–15)
AST: 19 U/L (ref 15–41)
Alkaline Phosphatase: 101 U/L (ref 38–126)
BILIRUBIN TOTAL: 0.4 mg/dL (ref 0.3–1.2)
BUN: 14 mg/dL (ref 6–20)
CO2: 25 mmol/L (ref 22–32)
Calcium: 9.5 mg/dL (ref 8.9–10.3)
Chloride: 109 mmol/L (ref 101–111)
Creatinine, Ser: 1.28 mg/dL — ABNORMAL HIGH (ref 0.61–1.24)
GFR calc Af Amer: >60 mL/min (ref >60)
GFR calc non Af Amer: 53 mL/min — ABNORMAL LOW (ref >60)
GLUCOSE: 143 mg/dL — AB (ref 65–99)
POTASSIUM: 3.6 mmol/L (ref 3.5–5.1)
SODIUM: 140 mmol/L (ref 135–145)
Total Protein: 8.1 g/dL (ref 6.5–8.1)

## 2017-04-10 LAB — CBC WITH DIFFERENTIAL/PLATELET
BASOS ABS: 0 10*3/uL (ref 0–0.1)
BASOS PCT: 1 %
EOS ABS: 0.2 10*3/uL (ref 0–0.7)
Eosinophils Relative: 5 %
HEMATOCRIT: 34.2 % — AB (ref 40.0–52.0)
HEMOGLOBIN: 11.7 g/dL — AB (ref 13.0–18.0)
Lymphocytes Relative: 21 %
Lymphs Abs: 0.9 10*3/uL — ABNORMAL LOW (ref 1.0–3.6)
MCH: 30.8 pg (ref 26.0–34.0)
MCHC: 34.2 g/dL (ref 32.0–36.0)
MCV: 90.1 fL (ref 80.0–100.0)
MONO ABS: 0.7 10*3/uL (ref 0.2–1.0)
MONOS PCT: 18 %
NEUTROS ABS: 2.4 10*3/uL (ref 1.4–6.5)
NEUTROS PCT: 55 %
Platelets: 159 10*3/uL (ref 150–440)
RBC: 3.79 MIL/uL — ABNORMAL LOW (ref 4.40–5.90)
RDW: 15 % — AB (ref 11.5–14.5)
WBC: 4.2 10*3/uL (ref 3.8–10.6)

## 2017-04-10 MED ORDER — HEPARIN SOD (PORK) LOCK FLUSH 100 UNIT/ML IV SOLN
500.0000 [IU] | Freq: Once | INTRAVENOUS | Status: AC | PRN
  Administered 2017-04-10: 500 [IU]
  Filled 2017-04-10: qty 5

## 2017-04-10 MED ORDER — DURVALUMAB 500 MG/10ML IV SOLN
980.0000 mg | Freq: Once | INTRAVENOUS | Status: AC
  Administered 2017-04-10: 980 mg via INTRAVENOUS
  Filled 2017-04-10: qty 10

## 2017-04-10 MED ORDER — APIXABAN 5 MG PO TABS: 5.0000 mg | ORAL_TABLET | Freq: Two times a day (BID) | ORAL | 2 refills | Status: AC

## 2017-04-10 MED ORDER — SODIUM CHLORIDE 0.9 % IV SOLN
Freq: Once | INTRAVENOUS | Status: AC
  Administered 2017-04-10: 12:00:00 via INTRAVENOUS
  Filled 2017-04-10: qty 1000

## 2017-04-10 MED ORDER — INFLUENZA VAC SPLIT QUAD 0.5 ML IM SUSY
0.5000 mL | PREFILLED_SYRINGE | Freq: Once | INTRAMUSCULAR | Status: AC
  Administered 2017-04-10: 0.5 mL via INTRAMUSCULAR
  Filled 2017-04-10: qty 0.5

## 2017-04-10 NOTE — Progress Notes (Signed)
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   Heme/Onc history:1. Patient is a 76 year old African-American male with a past medical history significant for COPD for which she sees Dr. Humphrey Rolls. He was recently admitted to the hospital in March 2018 with symptoms of pneumonia. CT thorax at that time showed: Perihilar lung mass with associated postobstructive atelectasis and consolidation of the right upper lobe  2. This was followed by a PET CT scan on 10/10/2016 which showed:1. Hypermetabolic RIGHT hilar mass with postobstructive collapse of the RIGHT upper lobe consistent with primary or metastatic bronchogenic carcinoma. 2. Hypermetabolic RIGHT paratracheal, prevascular and RIGHT supraclavicular nodal metastasis. 3. RIGHT supraclavicular node is intensely metabolic but likely too small for biopsy. 4. RIGHT lower lobe peripheral nodular thickening with associated metabolic activity is likely inflammatory. 5. Severe bullous change in the LEFT hemithorax  3. CT abdomen from 10/10/2016 showed no evidence of metastatic disease.  4. 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 Patient has problems with bilateral lower extremity swelling as well as shortness of breath on minimal exertion. He uses nighttime oxygen. He follows up with Dr. Floyce Stakes pulmonary as well as cardiology  6. Concurrent chemo/RT started on 11/13/16. Patient completed 5 weekly cycles of chemo/RT. He then had a gap of  3 weeks and restarted radiation boost for 3 weeks with concurrent chemo/RT. Interim scans after 5 weekly cycles showed partial response to treatment  7. Diagnosed with right lower lobe PE for which he was started on Eliquis. Has home oxygen which he does not use consistently.   Interval history- patient is here for evaluation prior to cycle 4 of maintenance durvalumab. He is on chronic Eliquis for PE. Denies bleeding events. He has SOB at baseline for which he has home oxygen but he does not use it consistently. He has chronic constipation with BMs every 2-3 days.     ECOG PS- 1 Pain scale- 3 Opioid associated constipation- no  Review of systems- Review of Systems  Constitutional: Positive for malaise/fatigue. Negative for chills, fever and weight loss.  HENT: Negative for congestion, ear discharge and nosebleeds.   Eyes: Negative for blurred vision.  Respiratory: Negative for cough, hemoptysis, sputum production, shortness of breath and wheezing.   Cardiovascular: Negative for chest pain, palpitations, orthopnea and claudication.       Pleuritic pain  Gastrointestinal: Negative for abdominal pain, blood in stool, constipation, diarrhea, heartburn, melena, nausea and vomiting.  Genitourinary: Negative for dysuria, flank pain, frequency, hematuria and urgency.  Musculoskeletal: Negative for back pain, joint pain and myalgias.  Skin: Negative for rash.  Neurological: Negative for dizziness, tingling, focal weakness, seizures, weakness and headaches.  Endo/Heme/Allergies: Does not bruise/bleed easily.  Psychiatric/Behavioral: Negative for depression and suicidal ideas. The patient does not have insomnia.      No Known Allergies   Past Medical History:  Diagnosis Date  . Cancer ([place])    Lung CA  . CHF (congestive heart failure) ([place])   . COPD (chronic obstructive pulmonary disease) ([place])   . Coronary artery  disease   . Diabetes mellitus without complication ([place])   . Fibromyalgia     . GERD (gastroesophageal reflux disease)   . Hypertension   . Pulmonary emboli [facility])      Past Surgical History:  Procedure Laterality Date  . COLONOSCOPY WITH PROPOFOL N/A 01/18/2015   Procedure: COLONOSCOPY WITH PROPOFOL;  Surgeon: Manya Silvas, MD;  [facility] ENDOSCOPY;  Service: Endoscopy;  Laterality: N/A;  . ESOPHAGOGASTRODUODENOSCOPY N/A 01/18/2015   Procedure: ESOPHAGOGASTRODUODENOSCOPY (EGD);  Surgeon: Manya Silvas, MD;  Location: [facility] ENDOSCOPY;  Service: Endoscopy;  Laterality: N/A;  . EYE SURGERY    . FLEXIBLE BRONCHOSCOPY N/A 10/22/2016   Procedure: FLEXIBLE BRONCHOSCOPY;  Surgeon: Allyne Gee, MD;  Location: [facility] ORS;  Service: Pulmonary;  Laterality: N/A;  . HERNIA REPAIR    . IR FLUORO GUIDE PORT INSERTION LEFT  11/07/2016  . REPAIR KNEE LIGAMENT    . TONSILLECTOMY      Social History   Social History  . Marital status: Married    Spouse name: N/A  . Number of children: N/A  . Years of education: N/A   Occupational History  . Not on file.   Social History Main Topics  . Smoking status: Former Smoker    Quit date: 12/01/2003  . Smokeless tobacco: Never Used  . Alcohol use 1.2 oz/week    2 Shots of liquor per week     Comment: on the weeknd 2 shots  . Drug use: No  . Sexual activity: Not on file   Other Topics Concern  . Not on file   Social History Narrative  . No narrative on file   Family History  Problem Relation Age of Onset  . Prostate cancer Father   . Prostate cancer Brother   . Diabetes Unknown        all 13 siblings  . Hypertension Unknown        all 13 siblings    Current Outpatient Prescriptions:  .  acetaminophen (TYLENOL) 325 MG tablet, Take 2 tablets (650 mg total) by mouth every 6 (six) hours as needed for mild pain (or Fever >/= 101)., Disp: , Rfl:  .  albuterol-ipratropium (COMBIVENT) 18-103 MCG/ACT inhaler, Inhale 2 puffs into the lungs every 6 (six) hours as needed for wheezing or shortness of breath., Disp: ,  Rfl:  .  Fluticasone Furoate-Vilanterol 100-25 MCG/INH AEPB, Inhale 1 puff into the lungs daily. , Disp: , Rfl:  .  lidocaine-prilocaine (EMLA) cream, Apply to affected area once, Disp: 30 g, Rfl: 3 .  metFORMIN (GLUCOPHAGE) 500 MG tablet, Take 500 mg by mouth 2 (two) times daily. , Disp: , Rfl:  .  Oxycodone HCl 10 MG TABS, Take 1 tablet (10 mg total) by mouth every 6 (six) hours as needed., Disp: 30 tablet, Rfl: 0 .  simvastatin (ZOCOR) 10 MG tablet, Take 10 mg by mouth every evening. , Disp: , Rfl:  .  apixaban (ELIQUIS) 5 MG TABS tablet, Take 1 tablet (5 mg total) by mouth 2 (two) times daily., Disp: 60 tablet, Rfl: 2 .  aspirin 325 MG tablet, Take 325 mg by mouth daily., Disp: , Rfl:  .  furosemide (LASIX) 40 MG tablet, Take 40 mg by mouth daily., Disp: , Rfl:  .  omeprazole (PRILOSEC) 40 MG capsule, Take 40 mg by mouth daily., Disp: , Rfl:  No current facility-administered medications for this visit.   Facility-Administered Medications Ordered in Other Visits:  .  Influenza vac split quadrivalent PF (  FLUARIX) injection 0.5 mL, 0.5 mL, Intramuscular, Once, Sindy Guadeloupe, MD .  sodium chloride flush (NS) 0.9 % injection 10 mL, 10 mL, Intravenous, PRN, Sindy Guadeloupe, MD, 10 mL at 01/30/17 0958  Physical exam:  Vitals:   04/10/17 1049  BP: 130/74  Pulse: (!) 106  Resp: (!) 22  Temp: 99.4 F (37.4 C)  TempSrc: Tympanic  SpO2: 92%  Weight: 203 lb 9.6 oz (92.4 kg)   Physical Exam  Constitutional: He is oriented to person, place, and time and well-developed, well-nourished, and in no distress.  HENT:  Head: Normocephalic and atraumatic.  Eyes: Pupils are equal, round, and reactive to light. EOM are normal.  Neck: Normal range of motion.  Cardiovascular: Normal rate, regular rhythm and normal heart sounds.   Pulmonary/Chest: Effort normal and breath sounds normal.  Abdominal: Soft. Bowel sounds are normal.  Neurological: He is alert and oriented to person, place, and time.  Skin:  Skin is warm and dry.     CMP Latest Ref Rng & Units 04/10/2017  Glucose 65 - 99 mg/dL 143(H)  BUN 6 - 20 mg/dL 14  Creatinine 0.61 - 1.24 mg/dL 1.28(H)  Sodium 135 - 145 mmol/L 140  Potassium 3.5 - 5.1 mmol/L 3.6  Chloride 101 - 111 mmol/L 109  CO2 22 - 32 mmol/L 25  Calcium 8.9 - 10.3 mg/dL 9.5  Total Protein 6.5 - 8.1 g/dL 8.1  Total Bilirubin 0.3 - 1.2 mg/dL 0.4  Alkaline Phos 38 - 126 U/L 101  AST 15 - 41 U/L 19  ALT 17 - 63 U/L 14(L)   CBC Latest Ref Rng & Units 04/10/2017  WBC 3.8 - 10.6 K/uL 4.2  Hemoglobin 13.0 - 18.0 g/dL 11.7(L)  Hematocrit 40.0 - 52.0 % 34.2(L)  Platelets 150 - 440 K/uL 159    No images are attached to the encounter.  No results found.   Assessment and plan- Patient is a 76 y.o. male with SCC of RUL Stage IIIB T1N3M0. He has completed concurrent chemo/RT with partial response. He is here for on treatment assessment prior to cycle 4 of durvalumab  Cycle 2 of durvalumab was not given because of symptoms of shortness of breath. He was ultimately diagnosed to have a small right-sided PE for which he is on eliquis currently. He took his last Eliquis pill this morning. Patient reports aspirin 325 mg was stopped by cardiology which differs from nurse report who called cardiology. He has chronic SOB but this has improved compared to when he had the PE. He has mild pleuritic chest wall pain but this improves with rest. This is likely related to his lung cancer and healing PE. No evidence of pneumonitis on recent CT. WBC normal at 4.2. Holding cycle 2 did not improve his symptoms. He continues to have productive cough which is likely related to severe emphysema. He does not consistently use his inhalers or oxygen because he doesn't feel it improves his symptoms. Encouraged patient to follow back up with pulmonology. Thyroid function tests normal 02/13/17 at 1.271.   Constipation- suspect this is related to his usage of opiates. Discussed the need for ample hydration  and he can consider otc medications such as miralax and senna. Discussed goal of bowel movement every 1-2 days. Could consider a trial of Movantik in the future if otc medications do not improve his symptoms.   Counts are okay to proceed with cycle 4 of maintenance durvalumab today. He reports that he tolerated cycle 3 well.  Will plan for him to rtc in 2 weeks with repeat cbc, cmp and consideration for cycle 5 of durvalumab. We will refill Eliquis today as he should continue anticoagulation. Encouraged patient to follow up with cardiology for clarification and will get cardiology's last note. Low grade temp of 99.4 but do not feel he has an active infection. Ok to proceed with flu shot. Will plan to re-check thyroid function in November in setting of durvalumab.    Visit Diagnosis 1. Malignant neoplasm of upper lobe of right lung ([place])   2. Encounter for antineoplastic immunotherapy     Beckey Rutter, NP 04/10/17 11:48 AM  Dr. Randa Evens, MD, MPH [place] [facility] Pager- [phone] 04/10/2017 12:00 PM

## 2017-04-10 NOTE — Progress Notes (Signed)
Patient is very short of breath today, especially on exertion. He continues to take Levaquin for previously diagnosed pneumonia. He states that he is very weak and tired.

## 2017-04-21 ENCOUNTER — Other Ambulatory Visit: Payer: Self-pay

## 2017-04-21 ENCOUNTER — Emergency Department: Payer: Medicare Other

## 2017-04-21 ENCOUNTER — Other Ambulatory Visit: Payer: Self-pay | Admitting: Oncology

## 2017-04-21 ENCOUNTER — Encounter: Payer: Self-pay | Admitting: Medical Oncology

## 2017-04-21 ENCOUNTER — Emergency Department
Admission: EM | Admit: 2017-04-21 | Discharge: 2017-04-21 | Disposition: A | Discharge status: Home / Self Care | Payer: Medicare Other | Attending: Emergency Medicine | Admitting: Emergency Medicine

## 2017-04-21 DIAGNOSIS — I110 Hypertensive heart disease with heart failure: Secondary | ICD-10-CM | POA: Insufficient documentation

## 2017-04-21 DIAGNOSIS — Z79899 Other long term (current) drug therapy: Secondary | ICD-10-CM | POA: Diagnosis not present

## 2017-04-21 DIAGNOSIS — Z7901 Long term (current) use of anticoagulants: Secondary | ICD-10-CM | POA: Diagnosis not present

## 2017-04-21 DIAGNOSIS — R0600 Dyspnea, unspecified: Secondary | ICD-10-CM | POA: Insufficient documentation

## 2017-04-21 DIAGNOSIS — R0602 Shortness of breath: Secondary | ICD-10-CM | POA: Diagnosis present

## 2017-04-21 DIAGNOSIS — I2510 Atherosclerotic heart disease of native coronary artery without angina pectoris: Secondary | ICD-10-CM | POA: Diagnosis not present

## 2017-04-21 DIAGNOSIS — Z85118 Personal history of other malignant neoplasm of bronchus and lung: Secondary | ICD-10-CM | POA: Diagnosis not present

## 2017-04-21 DIAGNOSIS — J449 Chronic obstructive pulmonary disease, unspecified: Secondary | ICD-10-CM | POA: Diagnosis not present

## 2017-04-21 DIAGNOSIS — Z7984 Long term (current) use of oral hypoglycemic drugs: Secondary | ICD-10-CM | POA: Diagnosis not present

## 2017-04-21 DIAGNOSIS — E119 Type 2 diabetes mellitus without complications: Secondary | ICD-10-CM | POA: Insufficient documentation

## 2017-04-21 DIAGNOSIS — I509 Heart failure, unspecified: Secondary | ICD-10-CM | POA: Diagnosis not present

## 2017-04-21 DIAGNOSIS — C3411 Malignant neoplasm of upper lobe, right bronchus or lung: Secondary | ICD-10-CM

## 2017-04-21 DIAGNOSIS — Z87891 Personal history of nicotine dependence: Secondary | ICD-10-CM | POA: Insufficient documentation

## 2017-04-21 LAB — CBC WITH DIFFERENTIAL/PLATELET
BASOS PCT: 1 %
Basophils Absolute: 0 10*3/uL (ref 0–0.1)
Eosinophils Absolute: 0.2 10*3/uL (ref 0–0.7)
Eosinophils Relative: 6 %
HEMATOCRIT: 37.6 % — AB (ref 40.0–52.0)
HEMOGLOBIN: 12.6 g/dL — AB (ref 13.0–18.0)
LYMPHS ABS: 0.7 10*3/uL — AB (ref 1.0–3.6)
Lymphocytes Relative: 18 %
MCH: 29.9 pg (ref 26.0–34.0)
MCHC: 33.4 g/dL (ref 32.0–36.0)
MCV: 89.6 fL (ref 80.0–100.0)
MONOS PCT: 19 %
Monocytes Absolute: 0.8 10*3/uL (ref 0.2–1.0)
NEUTROS ABS: 2.5 10*3/uL (ref 1.4–6.5)
NEUTROS PCT: 58 %
Platelets: 152 10*3/uL (ref 150–440)
RBC: 4.2 MIL/uL — ABNORMAL LOW (ref 4.40–5.90)
RDW: 15 % — ABNORMAL HIGH (ref 11.5–14.5)
WBC: 4.3 10*3/uL (ref 3.8–10.6)

## 2017-04-21 LAB — COMPREHENSIVE METABOLIC PANEL
ALBUMIN: 3.4 g/dL — AB (ref 3.5–5.0)
ALT: 14 U/L — ABNORMAL LOW (ref 17–63)
AST: 19 U/L (ref 15–41)
Alkaline Phosphatase: 91 U/L (ref 38–126)
Anion gap: 10 (ref 5–15)
BUN: 13 mg/dL (ref 6–20)
CHLORIDE: 107 mmol/L (ref 101–111)
CO2: 25 mmol/L (ref 22–32)
Calcium: 9.7 mg/dL (ref 8.9–10.3)
Creatinine, Ser: 1.18 mg/dL (ref 0.61–1.24)
GFR calc Af Amer: >60 mL/min (ref >60)
GFR calc non Af Amer: 59 mL/min — ABNORMAL LOW (ref >60)
GLUCOSE: 134 mg/dL — AB (ref 65–99)
POTASSIUM: 3.5 mmol/L (ref 3.5–5.1)
Sodium: 142 mmol/L (ref 135–145)
Total Bilirubin: 0.8 mg/dL (ref 0.3–1.2)
Total Protein: 8.1 g/dL (ref 6.5–8.1)

## 2017-04-21 LAB — TROPONIN I: Troponin I: <0.03 ng/mL (ref <0.03)

## 2017-04-21 LAB — BRAIN NATRIURETIC PEPTIDE: B Natriuretic Peptide: 130 pg/mL — ABNORMAL HIGH (ref 0.0–100.0)

## 2017-04-21 MED ORDER — HEPARIN SOD (PORK) LOCK FLUSH 100 UNIT/ML IV SOLN
INTRAVENOUS | Status: AC
  Filled 2017-04-21: qty 5

## 2017-04-21 MED ORDER — AMOXICILLIN-POT CLAVULANATE 875-125 MG PO TABS: 1.0000 | ORAL_TABLET | Freq: Two times a day (BID) | ORAL | 0 refills | Status: AC

## 2017-04-21 MED ORDER — IOPAMIDOL (ISOVUE-370) INJECTION 76%
75.0000 mL | Freq: Once | INTRAVENOUS | Status: AC | PRN
  Administered 2017-04-21: 75 mL via INTRAVENOUS

## 2017-04-21 NOTE — ED Notes (Signed)
Patient transported to X-ray 

## 2017-04-21 NOTE — ED Notes (Signed)
Patient transported to CT 

## 2017-04-21 NOTE — Discharge Instructions (Signed)
Dr. Stark Klein  wants you to go over to the office now he'll see you there. the cancer center doctor will follow you up later next week. She wants me to give you some antibiotics I will give you some Augmentin 1 pill twice a day take it with food. Sometimes it can cause diarrhea if you don't take it with food. If you get bad diarrhea please return here.

## 2017-04-21 NOTE — ED Provider Notes (Addendum)
[facility] Emergency Department Provider Note   ____________________________________________   First MD Initiated Contact with Patient 04/21/17 0940     (approximate)  I have reviewed the triage vital signs and the nursing notes.   HISTORY  Chief Complaint Shortness of Breath   HPI Chris Wilkinson is a 76 y.o. male Who reports 1 week of worsening shortness of breath. It's worse with exertion. He has not been running a fever but he is coughing up thick white phlegm. He is worried he is getting a pneumonia. He is not really having any chest pain or tightness. He has a history of stage III lung cancer is being treated for the cancer center here. Shortness of breath except with exertion. Reports he has to sit down and rest for a while after walking. He uses 1 L of oxygen per nasal cannula normally. He lately he's had to turn it up to 3 L when he's trying to walk.   Past Medical History:  Diagnosis Date  . Cancer ([place])    Lung CA  . CHF (congestive heart failure) ([place])   . COPD (chronic obstructive pulmonary disease) ([place])   . Coronary artery disease   . Diabetes mellitus without complication ([employer])   . Fibromyalgia   . GERD (gastroesophageal reflux disease)   . Hypertension   . Pulmonary emboli [facility])     Patient Active Problem List   Diagnosis Date Noted  . Encounter for antineoplastic immunotherapy 02/27/2017  . Carpal tunnel syndrome 12/04/2016  . Sprain of wrist 12/04/2016  . Goals of care, counseling/discussion 11/20/2016  . Lung cancer ([place]) 11/04/2016  . HCAP (healthcare-associated pneumonia) 09/22/2016  . Sepsis ([place]) 08/04/2015  . CAP (community acquired pneumonia) 08/04/2015  . COPD (chronic obstructive pulmonary disease) ([place]) 08/04/2015  . Type 2 diabetes mellitus ([place]) 08/04/2015  . CAD (coronary artery disease) 08/04/2015  . HTN (hypertension) 08/04/2015  . GERD (gastroesophageal reflux disease) 08/04/2015  . Bloating 12/04/2014  .  Gas 12/04/2014  . Hx of adenomatous polyp of colon 12/04/2014    Past Surgical History:  Procedure Laterality Date  . COLONOSCOPY WITH PROPOFOL N/A 01/18/2015   Procedure: COLONOSCOPY WITH PROPOFOL;  Surgeon: Manya Silvas, MD;  Location: [facility] ENDOSCOPY;  Service: Endoscopy;  Laterality: N/A;  . ESOPHAGOGASTRODUODENOSCOPY N/A 01/18/2015   Procedure: ESOPHAGOGASTRODUODENOSCOPY (EGD);  Surgeon: Manya Silvas, MD;  Location[facility] ENDOSCOPY;  Service: Endoscopy;  Laterality: N/A;  . EYE SURGERY    . FLEXIBLE BRONCHOSCOPY N/A 10/22/2016   Procedure: FLEXIBLE BRONCHOSCOPY;  Surgeon: Allyne Gee, MD;  Location: [facility] ORS;  Service: Pulmonary;  Laterality: N/A;  . HERNIA REPAIR    . IR FLUORO GUIDE PORT INSERTION LEFT  11/07/2016  . REPAIR KNEE LIGAMENT    . TONSILLECTOMY      Prior to Admission medications   Medication Sig Start Date End Date Taking? Authorizing Provider  acetaminophen (TYLENOL) 325 MG tablet Take 2 tablets (650 mg total) by mouth every 6 (six) hours as needed for mild pain (or Fever >/= 101). 09/26/16  Yes Gouru, Illene Silver, MD  albuterol-ipratropium (COMBIVENT) 18-103 MCG/ACT inhaler Inhale 2 puffs into the lungs every 6 (six) hours as needed for wheezing or shortness of breath.   Yes [provider]  apixaban (ELIQUIS) 5 MG TABS tablet Take 1 tablet (5 mg total) by mouth 2 (two) times daily. 04/10/17  Yes Verlon Au, NP  Fluticasone Furoate-Vilanterol 100-25 MCG/INH AEPB Inhale 1 puff into the lungs daily.  Yes [provider]  furosemide (LASIX) 40 MG tablet Take 40 mg by mouth daily.   Yes [provider]  lidocaine-prilocaine (EMLA) cream Apply to affected area once Patient taking differently: Apply 1 application topically once. Apply to affected area once prior to treatment 02/09/17  Yes Sindy Guadeloupe, MD  metFORMIN (GLUCOPHAGE) 500 MG tablet Take 500 mg by mouth 2 (two) times daily.    Yes [provider]  metoprolol tartrate  (LOPRESSOR) 25 MG tablet Take 25 mg by mouth 2 (two) times daily.   Yes [provider]  Oxycodone HCl 10 MG TABS Take 1 tablet (10 mg total) by mouth every 6 (six) hours as needed. Patient taking differently: Take 10 mg by mouth every 6 (six) hours as needed. For severe pain 04/03/17  Yes Verlon Au, NP  simvastatin (ZOCOR) 10 MG tablet Take 10 mg by mouth every evening.    Yes [provider]    Allergies Patient has no known allergies.  Family History  Problem Relation Age of Onset  . Prostate cancer Father   . Prostate cancer Brother   . Diabetes Unknown        all 13 siblings  . Hypertension Unknown        all 13 siblings    Social History Social History  Substance Use Topics  . Smoking status: Former Smoker    Quit date: 12/01/2003  . Smokeless tobacco: Never Used  . Alcohol use 1.2 oz/week    2 Shots of liquor per week     Comment: on the weeknd 2 shots    Review of Systems  Constitutional: No fever/chills Eyes: No visual changes. ENT: No sore throat. Cardiovascular: Denies chest pain. Respiratory:shortness of breath. Gastrointestinal: No abdominal pain.  No nausea, no vomiting.  No diarrhea.  No constipation. Genitourinary: Negative for dysuria. Musculoskeletal: Negative for back pain. Skin: Negative for rash. Neurological: Negative for headaches, focal weakness or numbness.   ____________________________________________   PHYSICAL EXAM:  VITAL SIGNS: ED Triage Vitals [04/21/17 0932]  Enc Vitals Group     BP 125/80     Pulse Rate (!) 113     Resp 20     Temp 98.3 F (36.8 C)     Temp Source Oral     SpO2 95 %     Weight 203 lb (92.1 kg)     Height      Head Circumference      Peak Flow      Pain Score 0     Pain Loc      Pain Edu?      Excl. in [place]?     Constitutional: Alert and oriented. Well appearing and in no acute distress. Eyes: Conjunctivae are normal. PERRL. EOMI. Head: Atraumatic. Nose: No  congestion/rhinnorhea. Mouth/Throat: Mucous membranes are moist.  Oropharynx non-erythematous. Neck: No stridor.  Cardiovascular: Normal rate, regular rhythm. Grossly normal heart sounds.  Good peripheral circulation. Respiratory: Normal respiratory effort.  No retractions. Lungs CTAB. Gastrointestinal: Soft and nontender. No distention. No abdominal bruits. No CVA tenderness. Musculoskeletal: No lower extremity tenderness trace bilateral edema.  No joint effusions. Neurologic:  Normal speech and language. No gross focal neurologic deficits are appreciated. No gait instability. Skin:  Skin is warm, dry and intact. No rash noted. Psychiatric: Mood and affect are normal. Speech and behavior are normal.  ____________________________________________   LABS (all labs ordered are listed, but only abnormal results are displayed)  Labs Reviewed  COMPREHENSIVE METABOLIC  PANEL - Abnormal; Notable for the following:       Result Value   Glucose, Bld 134 (*)    Albumin 3.4 (*)    ALT 14 (*)    GFR calc non Af Amer 59 (*)    All other components within normal limits  BRAIN NATRIURETIC PEPTIDE - Abnormal; Notable for the following:    B Natriuretic Peptide 130.0 (*)    All other components within normal limits  CBC WITH DIFFERENTIAL/PLATELET - Abnormal; Notable for the following:    RBC 4.20 (*)    Hemoglobin 12.6 (*)    HCT 37.6 (*)    RDW 15.0 (*)    Lymphs Abs 0.7 (*)    All other components within normal limits  TROPONIN I   ____________________________________________  EKG  EKG read and interpreted by me shows sinus tachycardia rate of 106 GC is 542 there are flipped T's inferiorly and in the V leads which are new from last EKG. ____________________________________________  RADIOLOGY  IMPRESSION: 1. Motion degraded scan.  No evidence of pulmonary embolism. 2. Increased sharply marginated patchy consolidation and ground-glass attenuation in the parahilar right lung,  compatible with evolving radiation pneumonitis. 3. New small dependent right pleural effusion. 4. Right upper lobe lung mass is stable. Separate subcentimeter right upper lobe pulmonary nodule is stable . 5. No recurrent thoracic adenopathy or other findings of metastatic disease in the chest. The 6. 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 Recommend annual imaging followup by CTA or MRA. This recommendation follows 2010 ACCF/AHA/AATS/ACR/ASA/SCA/SCAI/SIR/STS/SVM Guidelines for the Diagnosis and Management of Patients with Thoracic Aortic Disease. Circulation. 2010; 121: [id number] 7. Two-vessel coronary atherosclerosis.  Aortic Atherosclerosis (ICD10-I70.0) and Emphysema (ICD10-J43.9).   Electronically Signed   By: Ilona Sorrel M.D.   On: 04/21/2017 11:41 ____________________________________________   PROCEDURES  Procedure(s) performed:   Procedures  Critical Care performed:   ____________________________________________   INITIAL IMPRESSION / ASSESSMENT AND PLAN / ED COURSE  As part of my medical decision making, I reviewed the following data within the [facility] EKGs and reviewed the past medical history especially in care everywhere.   discussed patient in the CT with the cancer center doctors. They wish me to give him some antibiotics and they will follow him up next week after discussing his CT with the tumor board. I will use Augmentin because his QTC is already 542 I do not want to give him Levaquin. I also discussed his case with Dr. Stark Klein who wishes him to come to the office now he will do an echo in the office now and further evaluate the patient. His troponin is negative.     ____________________________________________   FINAL CLINICAL IMPRESSION(S) / ED DIAGNOSES  Final diagnoses:  Dyspnea, unspecified type      NEW MEDICATIONS STARTED DURING THIS VISIT:  New Prescriptions   No medications on file      Note:  This document was prepared using Dragon voice recognition software and may include unintentional dictation errors.    Nena Polio, MD 04/21/17 1223    Nena Polio, MD 04/21/17 1229

## 2017-04-21 NOTE — ED Notes (Signed)
AAOx3.  Skin warm and dry.  NAD 

## 2017-04-21 NOTE — ED Triage Notes (Signed)
Pt reports hx of lung CA, states for the past 2 weeks he has been having worsening sob and is worried he may be getting pneumonia. Pt denies pain.

## 2017-04-23 ENCOUNTER — Other Ambulatory Visit: Payer: Self-pay | Admitting: *Deleted

## 2017-04-23 ENCOUNTER — Other Ambulatory Visit: Payer: Self-pay | Admitting: Nurse Practitioner

## 2017-04-23 ENCOUNTER — Telehealth: Payer: Self-pay | Admitting: *Deleted

## 2017-04-23 DIAGNOSIS — C3411 Malignant neoplasm of upper lobe, right bronchus or lung: Secondary | ICD-10-CM

## 2017-04-23 MED ORDER — PREDNISONE 20 MG PO TABS: 20.0000 mg | ORAL_TABLET | ORAL | 0 refills | Status: DC

## 2017-04-23 MED ORDER — OXYCODONE HCL 10 MG PO TABS: 10.0000 mg | ORAL_TABLET | Freq: Four times a day (QID) | ORAL | 0 refills | Status: DC | PRN

## 2017-04-23 NOTE — Telephone Encounter (Signed)
Called pt and let him know that his case was discussed in case conference.  Everyone felt that his scans showed pneumonitis. Not sure if it is from radiation or chemotherapy.  I told him the best course of treatment either way is to give him steroids. He is agreeable to this. We have cancelled his appt for 10/12 and he has one 10/17 with Dr. Janese Banks

## 2017-04-24 ENCOUNTER — Inpatient Hospital Stay: Payer: Medicare Other | Admitting: Oncology

## 2017-04-24 ENCOUNTER — Inpatient Hospital Stay: Payer: Medicare Other

## 2017-04-28 ENCOUNTER — Inpatient Hospital Stay: Payer: Medicare Other | Attending: Oncology | Admitting: Oncology

## 2017-04-28 ENCOUNTER — Encounter: Payer: Self-pay | Admitting: Oncology

## 2017-04-28 ENCOUNTER — Inpatient Hospital Stay: Payer: Medicare Other

## 2017-04-28 VITALS — BP 127/78 | HR 106 | Temp 98.1°F | Resp 16 | Wt 205.0 lb

## 2017-04-28 DIAGNOSIS — K219 Gastro-esophageal reflux disease without esophagitis: Secondary | ICD-10-CM | POA: Diagnosis not present

## 2017-04-28 DIAGNOSIS — I509 Heart failure, unspecified: Secondary | ICD-10-CM | POA: Insufficient documentation

## 2017-04-28 DIAGNOSIS — I700 Atherosclerosis of aorta: Secondary | ICD-10-CM | POA: Diagnosis not present

## 2017-04-28 DIAGNOSIS — C3411 Malignant neoplasm of upper lobe, right bronchus or lung: Secondary | ICD-10-CM

## 2017-04-28 DIAGNOSIS — M19011 Primary osteoarthritis, right shoulder: Secondary | ICD-10-CM | POA: Diagnosis not present

## 2017-04-28 DIAGNOSIS — I110 Hypertensive heart disease with heart failure: Secondary | ICD-10-CM | POA: Insufficient documentation

## 2017-04-28 DIAGNOSIS — M797 Fibromyalgia: Secondary | ICD-10-CM | POA: Insufficient documentation

## 2017-04-28 DIAGNOSIS — I2510 Atherosclerotic heart disease of native coronary artery without angina pectoris: Secondary | ICD-10-CM | POA: Diagnosis not present

## 2017-04-28 DIAGNOSIS — N281 Cyst of kidney, acquired: Secondary | ICD-10-CM | POA: Diagnosis not present

## 2017-04-28 DIAGNOSIS — Z87891 Personal history of nicotine dependence: Secondary | ICD-10-CM | POA: Insufficient documentation

## 2017-04-28 DIAGNOSIS — Z7901 Long term (current) use of anticoagulants: Secondary | ICD-10-CM

## 2017-04-28 DIAGNOSIS — Z79899 Other long term (current) drug therapy: Secondary | ICD-10-CM

## 2017-04-28 DIAGNOSIS — Z7984 Long term (current) use of oral hypoglycemic drugs: Secondary | ICD-10-CM | POA: Diagnosis not present

## 2017-04-28 DIAGNOSIS — J440 Chronic obstructive pulmonary disease with acute lower respiratory infection: Secondary | ICD-10-CM

## 2017-04-28 DIAGNOSIS — C770 Secondary and unspecified malignant neoplasm of lymph nodes of head, face and neck: Secondary | ICD-10-CM | POA: Diagnosis not present

## 2017-04-28 DIAGNOSIS — Z923 Personal history of irradiation: Secondary | ICD-10-CM | POA: Diagnosis not present

## 2017-04-28 DIAGNOSIS — Z86711 Personal history of pulmonary embolism: Secondary | ICD-10-CM

## 2017-04-28 DIAGNOSIS — E119 Type 2 diabetes mellitus without complications: Secondary | ICD-10-CM | POA: Insufficient documentation

## 2017-04-28 DIAGNOSIS — M19012 Primary osteoarthritis, left shoulder: Secondary | ICD-10-CM | POA: Insufficient documentation

## 2017-04-28 DIAGNOSIS — J189 Pneumonia, unspecified organism: Secondary | ICD-10-CM

## 2017-04-28 LAB — CBC WITH DIFFERENTIAL/PLATELET
BASOS PCT: 0 %
Basophils Absolute: 0 10*3/uL (ref 0–0.1)
EOS ABS: 0 10*3/uL (ref 0–0.7)
EOS PCT: 0 %
HCT: 37.6 % — ABNORMAL LOW (ref 40.0–52.0)
Hemoglobin: 12.4 g/dL — ABNORMAL LOW (ref 13.0–18.0)
Lymphocytes Relative: 9 %
Lymphs Abs: 0.6 10*3/uL — ABNORMAL LOW (ref 1.0–3.6)
MCH: 30.1 pg (ref 26.0–34.0)
MCHC: 33 g/dL (ref 32.0–36.0)
MCV: 91.1 fL (ref 80.0–100.0)
MONO ABS: 0.6 10*3/uL (ref 0.2–1.0)
Monocytes Relative: 8 %
Neutro Abs: 6.2 10*3/uL (ref 1.4–6.5)
Neutrophils Relative %: 83 %
PLATELETS: 155 10*3/uL (ref 150–440)
RBC: 4.13 MIL/uL — ABNORMAL LOW (ref 4.40–5.90)
RDW: 15.1 % — AB (ref 11.5–14.5)
WBC: 7.5 10*3/uL (ref 3.8–10.6)

## 2017-04-28 LAB — COMPREHENSIVE METABOLIC PANEL
ALBUMIN: 3.5 g/dL (ref 3.5–5.0)
ALT: 16 U/L — ABNORMAL LOW (ref 17–63)
ANION GAP: 7 (ref 5–15)
AST: 22 U/L (ref 15–41)
Alkaline Phosphatase: 98 U/L (ref 38–126)
BUN: 14 mg/dL (ref 6–20)
CO2: 26 mmol/L (ref 22–32)
Calcium: 9.7 mg/dL (ref 8.9–10.3)
Chloride: 106 mmol/L (ref 101–111)
Creatinine, Ser: 1.27 mg/dL — ABNORMAL HIGH (ref 0.61–1.24)
GFR calc non Af Amer: 54 mL/min — ABNORMAL LOW (ref >60)
GLUCOSE: 186 mg/dL — AB (ref 65–99)
POTASSIUM: 3.6 mmol/L (ref 3.5–5.1)
SODIUM: 139 mmol/L (ref 135–145)
Total Bilirubin: 0.5 mg/dL (ref 0.3–1.2)
Total Protein: 7.9 g/dL (ref 6.5–8.1)

## 2017-04-28 MED ORDER — PREDNISONE 10 MG PO TABS: 10.0000 mg | ORAL_TABLET | ORAL | 0 refills | Status: DC

## 2017-04-28 MED ORDER — OMEPRAZOLE 40 MG PO CPDR: 40.0000 mg | DELAYED_RELEASE_CAPSULE | Freq: Every day | ORAL | 1 refills | Status: DC

## 2017-04-28 NOTE — Progress Notes (Signed)
Hematology/Oncology Consult note [facility]  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 Patient is a 76 year old African-American male with a past medical history significant for COPD for which she sees Dr. Humphrey Rolls. He was recently admitted to the hospital in March 2018 with symptoms of pneumonia. CT thorax at that time showed: Perihilar lung mass with associated postobstructive atelectasis and consolidation of the right upper lobe  2. This was followed by a PET CT scan on 10/10/2016 which showed:1. Hypermetabolic RIGHT hilar mass with postobstructive collapse of the RIGHT upper lobe consistent with primary or metastatic bronchogenic carcinoma. 2. Hypermetabolic RIGHT paratracheal, prevascular and RIGHT supraclavicular nodal metastasis. 3. RIGHT supraclavicular node is intensely metabolic but likely too small for biopsy. 4. RIGHT lower lobe peripheral nodular thickening with associated metabolic activity is likely inflammatory. 5. Severe bullous change in the LEFT hemithorax  3. CT abdomen from 10/10/2016 showed no evidence of metastatic disease.  4. 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 Patient has problems with bilateral lower extremity swelling as well as shortness of breath on minimal exertion. He uses nighttime oxygen. He follows up with Dr. Floyce Stakes pulmonary as well as cardiology  6. Concurrent chemo/RT started on 11/13/16. Patient completed 5 weekly cycles of chemo/RT. He then had a gap of 3 weeks and restarted radiation  boost for 3 weeks with concurrent chemo/RT. Interim scans after 5 weekly cycles showed partial response to treatment  7. Maintenance durvalumab started in august 2018 and patient has received 4 doses so far  Interval history- Patient presented to the ER last week with symptoms of worsening sob. Repeat ct chest showed possible pneumonitis. He has completed antibiotic course as well. He does report SOB with minimal exertion even while walking to the bathroom and requires O2 all the time which is now up to 3L  ECOG PS- 2 Pain scale- 0 Opioid associated constipation- no  Review of systems- Review of Systems  Constitutional: Positive for malaise/fatigue. Negative for chills, fever and weight loss.  HENT: Negative for congestion, ear discharge and nosebleeds.   Eyes: Negative for blurred vision.  Respiratory: Positive for shortness of breath. Negative for cough, hemoptysis, sputum production and wheezing.   Cardiovascular: Negative for chest pain, palpitations, orthopnea and claudication.  Gastrointestinal: Negative for abdominal pain, blood in stool, constipation, diarrhea, heartburn, melena, nausea and vomiting.  Genitourinary: Negative for dysuria, flank pain, frequency, hematuria and urgency.  Musculoskeletal: Negative for back pain, joint pain and myalgias.  Skin: Negative for rash.  Neurological: Negative for dizziness, tingling, focal weakness, seizures, weakness and headaches.  Endo/Heme/Allergies: Does not bruise/bleed easily.  Psychiatric/Behavioral: Negative for depression and suicidal ideas. The patient does not have insomnia.       No Known Allergies   Past Medical History:  Diagnosis Date  . Cancer ([place])    Lung CA  . CHF (congestive heart failure) ([place])   . COPD (chronic obstructive pulmonary disease) ([place])   . Coronary artery disease   . Diabetes mellitus without complication ([place])   . Fibromyalgia   . GERD (gastroesophageal reflux disease)   .  Hypertension   .  Pulmonary emboli [facility])      Past Surgical History:  Procedure Laterality Date  . COLONOSCOPY WITH PROPOFOL N/A 01/18/2015   Procedure: COLONOSCOPY WITH PROPOFOL;  Surgeon: Manya Silvas, MD;  Location: [facility] ENDOSCOPY;  Service: Endoscopy;  Laterality: N/A;  . ESOPHAGOGASTRODUODENOSCOPY N/A 01/18/2015   Procedure: ESOPHAGOGASTRODUODENOSCOPY (EGD);  Surgeon: Manya Silvas, MD;  Location: [facility] ENDOSCOPY;  Service: Endoscopy;  Laterality: N/A;  . EYE SURGERY    . FLEXIBLE BRONCHOSCOPY N/A 10/22/2016   Procedure: FLEXIBLE BRONCHOSCOPY;  Surgeon: Allyne Gee, MD;  Location: [facility] ORS;  Service: Pulmonary;  Laterality: N/A;  . HERNIA REPAIR    . IR FLUORO GUIDE PORT INSERTION LEFT  11/07/2016  . REPAIR KNEE LIGAMENT    . TONSILLECTOMY      Social History   Social History  . Marital status: Married    Spouse name: N/A  . Number of children: N/A  . Years of education: N/A   Occupational History  . Not on file.   Social History Main Topics  . Smoking status: Former Smoker    Quit date: 12/01/2003  . Smokeless tobacco: Never Used  . Alcohol use 1.2 oz/week    2 Shots of liquor per week     Comment: on the weeknd 2 shots  . Drug use: No  . Sexual activity: Not on file   Other Topics Concern  . Not on file   Social History Narrative  . No narrative on file    Family History  Problem Relation Age of Onset  . Prostate cancer Father   . Prostate cancer Brother   . Diabetes Unknown        all 13 siblings  . Hypertension Unknown        all 13 siblings     Current Outpatient Prescriptions:  .  acetaminophen (TYLENOL) 325 MG tablet, Take 2 tablets (650 mg total) by mouth every 6 (six) hours as needed for mild pain (or Fever >/= 101)., Disp: , Rfl:  .  albuterol-ipratropium (COMBIVENT) 18-103 MCG/ACT inhaler, Inhale 2 puffs into the lungs every 6 (six) hours as needed for wheezing or shortness of breath., Disp: , Rfl:  .  amoxicillin-clavulanate (AUGMENTIN) 875-125 MG tablet,  Take 1 tablet by mouth 2 (two) times daily., Disp: 20 tablet, Rfl: 0 .  apixaban (ELIQUIS) 5 MG TABS tablet, Take 1 tablet (5 mg total) by mouth 2 (two) times daily., Disp: 60 tablet, Rfl: 2 .  Fluticasone Furoate-Vilanterol 100-25 MCG/INH AEPB, Inhale 1 puff into the lungs daily. , Disp: , Rfl:  .  furosemide (LASIX) 40 MG tablet, Take 40 mg by mouth daily., Disp: , Rfl:  .  lidocaine-prilocaine (EMLA) cream, Apply to affected area once (Patient taking differently: Apply 1 application topically once. Apply to affected area once prior to treatment), Disp: 30 g, Rfl: 3 .  metFORMIN (GLUCOPHAGE) 500 MG tablet, Take 500 mg by mouth 2 (two) times daily. , Disp: , Rfl:  .  metoprolol tartrate (LOPRESSOR) 25 MG tablet, Take 25 mg by mouth 2 (two) times daily., Disp: , Rfl:  .  omeprazole (PRILOSEC) 40 MG capsule, Take 1 capsule (40 mg total) by mouth daily., Disp: 30 capsule, Rfl: 1 .  Oxycodone HCl 10 MG TABS, Take 1 tablet (10 mg total) by mouth every 6 (six) hours as needed., Disp: 30 tablet, Rfl: 0 .  predniSONE (DELTASONE) 10 MG tablet, Take 1 tablet (10 mg total) by mouth as directed. 5  tablets x 1 week, 4 tablets x 1 week, 3 tablets x 1 week, 2 tablets x 1 week with food, Disp: 98 tablet, Rfl: 0 .  simvastatin (ZOCOR) 10 MG tablet, Take 10 mg by mouth every evening. , Disp: , Rfl:  No current facility-administered medications for this visit.   Facility-Administered Medications Ordered in Other Visits:  .  sodium chloride flush (NS) 0.9 % injection 10 mL, 10 mL, Intravenous, PRN, Sindy Guadeloupe, MD, 10 mL at 01/30/17 0958  Physical exam:  Vitals:   04/28/17 1029  BP: 127/78  Pulse: (!) 106  Resp: 16  Temp: 98.1 F (36.7 C)  TempSrc: Tympanic  SpO2: (!) 3%  Weight: 205 lb (93 kg)   Physical Exam  Constitutional: He is oriented to person, place, and time and well-developed, well-nourished, and in no distress.  HENT:  Head: Normocephalic and atraumatic.  Eyes: Pupils are equal, round,  and reactive to light. EOM are normal.  Neck: Normal range of motion.  Cardiovascular: Normal rate, regular rhythm and normal heart sounds.   Pulmonary/Chest: Effort normal.  Breath sounds diminished b/l  Abdominal: Soft. Bowel sounds are normal.  Musculoskeletal: He exhibits edema (trace. improved).  Neurological: He is alert and oriented to person, place, and time.  Skin: Skin is warm and dry.     CMP Latest Ref Rng & Units 04/28/2017  Glucose 65 - 99 mg/dL 186(H)  BUN 6 - 20 mg/dL 14  Creatinine 0.61 - 1.24 mg/dL 1.27(H)  Sodium 135 - 145 mmol/L 139  Potassium 3.5 - 5.1 mmol/L 3.6  Chloride 101 - 111 mmol/L 106  CO2 22 - 32 mmol/L 26  Calcium 8.9 - 10.3 mg/dL 9.7  Total Protein 6.5 - 8.1 g/dL 7.9  Total Bilirubin 0.3 - 1.2 mg/dL 0.5  Alkaline Phos 38 - 126 U/L 98  AST 15 - 41 U/L 22  ALT 17 - 63 U/L 16(L)   CBC Latest Ref Rng & Units 04/28/2017  WBC 3.8 - 10.6 K/uL 7.5  Hemoglobin 13.0 - 18.0 g/dL 12.4(L)  Hematocrit 40.0 - 52.0 % 37.6(L)  Platelets 150 - 440 K/uL 155    No images are attached to the encounter.  Dg Chest 2 View  Result Date: 04/21/2017 CLINICAL DATA:  History lung cancer.  Worsening shortness of breath. EXAM: CHEST  2 VIEW COMPARISON:  CT chest 03/10/2017 FINDINGS: Right perihilar scarring unchanged compared with the prior exam. Stable right upper lobe pulmonary mass not well-visualized on the current examination better visualized on prior CT dated 03/10/2017. Right lung volume loss. Large left upper lobe bulla. No focal consolidation, pleural effusion or pneumothorax. Stable cardiomediastinal silhouette. Left-sided Port-A-Cath with the tip projecting over the cavoatrial junction. No acute osseous abnormality. Mild osteoarthritis of bilateral glenohumeral joints. IMPRESSION: No active cardiopulmonary disease. Right perihilar scarring and volume loss consistent with post treatment changes. Stable right upper lobe mass. Electronically Signed   By: Kathreen Devoid    On: 04/21/2017 10:19   Ct Angio Chest Pe W And/or Wo Contrast  Result Date: 04/21/2017 CLINICAL DATA:  Worsening dyspnea. Right upper lobe lung cancer diagnosed March 2018. Patient completed radiation therapy in July 2018. EXAM: CT ANGIOGRAPHY CHEST WITH CONTRAST TECHNIQUE: Multidetector CT imaging of the chest was performed using the standard protocol during bolus administration of intravenous contrast. Multiplanar CT image reconstructions and MIPs were obtained to evaluate the vascular anatomy. CONTRAST:  75 cc Isovue 370 IV. COMPARISON:  03/02/2017 chest CT. Chest radiograph from earlier today. FINDINGS: Cardiovascular:  The study is moderate quality for the evaluation of pulmonary embolism, limited by motion artifact. There are no filling defects in the central, lobar, segmental or subsegmental pulmonary artery branches to suggest acute pulmonary embolism. 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 Stable top-normal caliber pulmonary arteries (main pulmonary artery diameter 3.3 cm). Normal heart size. No significant pericardial fluid/thickening. Left internal jugular MediPort terminates at the cavoatrial junction. Left anterior descending and right coronary atherosclerosis. Mediastinum/Nodes: No discrete thyroid nodules. Unremarkable esophagus. No pathologically enlarged axillary, mediastinal or hilar lymph nodes. Lungs/Pleura: No pneumothorax. New small dependent right pleural effusion. No left pleural effusion. Severe centrilobular and paraseptal emphysema with mild diffuse bronchial wall thickening. Irregular 3.1 x 2.7 cm right upper lobe lung mass (series 9/ image 46), previously 3.0 x 2.7 cm, stable. Right upper lobe 5 mm subsolid pulmonary nodule (series 9/ image 33), stable. There is increased sharply marginated patchy consolidation and ground-glass attenuation in the parahilar right lung. Stable parenchymal band in the medial left upper lobe. No new significant pulmonary nodules. Upper abdomen:  Partially visualized simple appearing exophytic renal cyst in the posterior upper left kidney measuring at least 7.3 cm in size. Musculoskeletal: No aggressive appearing focal osseous lesions. Marked thoracic spondylosis. Review of the MIP images confirms the above findings. IMPRESSION: 1. Motion degraded scan.  No evidence of pulmonary embolism. 2. Increased sharply marginated patchy consolidation and ground-glass attenuation in the parahilar right lung, compatible with evolving radiation pneumonitis. 3. New small dependent right pleural effusion. 4. Right upper lobe lung mass is stable. Separate subcentimeter right upper lobe pulmonary nodule is stable . 5. No recurrent thoracic adenopathy or other findings of metastatic disease in the chest. The 6. 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 Recommend annual imaging followup by CTA or MRA. This recommendation follows 2010 ACCF/AHA/AATS/ACR/ASA/SCA/SCAI/SIR/STS/SVM Guidelines for the Diagnosis and Management of Patients with Thoracic Aortic Disease. Circulation. 2010; 121: e266-e369 7. Two-vessel coronary atherosclerosis. Aortic Atherosclerosis (ICD10-I70.0) and Emphysema (ICD10-J43.9). Electronically Signed   By: Ilona Sorrel M.D.   On: 04/21/2017 11:41     Assessment and plan- Patient is a 76 y.o. male  with SCC of RUL Stage IIIB T1N3M0. He has completed concurrent chemo/RT with partial response and 4 cycles of maintenance durvalumab  I have reviewed patients CT scan personally and also discussed at tumor board with radiology and my medical oncology colleagues and Dr. Baruch Gouty and pulmonary. Pattern of pneumonitis more consistent with radiation induced pneumonitis rather than immunotherapy but annot rule that out for sure. He was started on high dose steroids and is currently taking 30 mg prednisone. He continues to feel SOB  SOB multifactorial due to baseline severe emphysema along with lung cancer, recent PE and now pneumonitis. I will therefore  increase his prednisone to 50 mg daily and follow a slow taper over 1 month. Decrease by 10 mg every 1 week. I will see him back in 1 week and get CXR prior. He is getting ECHO and seeing his cardiologist shortly  I will plan to discontinue durvalumab at this time given risk of possible pneumonitis. I will monitor his lung cancer with periodic scans and I will repeat ct chest abdomen and pelvis 3 months from now  He will continue eliquis for his PE   Visit Diagnosis 1. Malignant neoplasm of upper lobe of right lung ([place])   2. Pneumonitis      Dr. Randa Evens, MD, MPH [place] at [facility] Pager- [phone] 04/28/2017 2:15 PM

## 2017-04-28 NOTE — Progress Notes (Signed)
Patient complains of extreme shortness of breath on exertion and says he is very fatigued. His O2 sat drops from 93 to 87 when he walks a short distance. He thinks the chemotherapy is making him feel so bad. He was seen in the ED on 10/9 with worsening shortness of breath.

## 2017-05-11 ENCOUNTER — Other Ambulatory Visit: Payer: Self-pay | Admitting: Nurse Practitioner

## 2017-05-11 ENCOUNTER — Other Ambulatory Visit: Payer: Self-pay | Admitting: *Deleted

## 2017-05-11 DIAGNOSIS — C3411 Malignant neoplasm of upper lobe, right bronchus or lung: Secondary | ICD-10-CM

## 2017-05-11 MED ORDER — OXYCODONE HCL 10 MG PO TABS: 10.0000 mg | ORAL_TABLET | Freq: Four times a day (QID) | ORAL | 0 refills | Status: DC | PRN

## 2017-05-11 NOTE — Telephone Encounter (Signed)
Pt's wife called back to request refill of oxycodone. Pt complains of having intermittent pain.

## 2017-05-11 NOTE — Addendum Note (Signed)
Addended by: Telford Nab on: 05/11/2017 12:11 PM   Modules accepted: Orders

## 2017-05-11 NOTE — Telephone Encounter (Signed)
Pt called to report has been having difficulty passing urine. Stated he thinks it is from omeprazole capsules. States that he was prescribed these capsules by his heart doctor, Dr. Humphrey Rolls. Pt stopped taking yesterday and feels like his symptoms have improved and that he is able to pass urine better today. Informed pt that will need to contact Dr. Laurelyn Sickle office for further management of symptoms and medication. Pt verbalized understanding.

## 2017-05-11 NOTE — Telephone Encounter (Signed)
Pt's wife notified that prescription is ready for pick up at the front desk.

## 2017-05-26 ENCOUNTER — Emergency Department
Admission: EM | Admit: 2017-05-26 | Discharge: 2017-05-26 | Disposition: A | Discharge status: Home / Self Care | Payer: Medicare Other | Attending: Emergency Medicine | Admitting: Emergency Medicine

## 2017-05-26 ENCOUNTER — Telehealth: Payer: Self-pay | Admitting: Oncology

## 2017-05-26 ENCOUNTER — Other Ambulatory Visit: Payer: Self-pay

## 2017-05-26 ENCOUNTER — Inpatient Hospital Stay: Payer: Medicare Other | Attending: Oncology

## 2017-05-26 ENCOUNTER — Encounter: Payer: Self-pay | Admitting: Oncology

## 2017-05-26 ENCOUNTER — Encounter: Payer: Self-pay | Admitting: *Deleted

## 2017-05-26 ENCOUNTER — Inpatient Hospital Stay (HOSPITAL_BASED_OUTPATIENT_CLINIC_OR_DEPARTMENT_OTHER): Payer: Medicare Other | Admitting: Oncology

## 2017-05-26 VITALS — BP 112/76 | HR 103 | Temp 98.7°F | Wt 202.0 lb

## 2017-05-26 DIAGNOSIS — I110 Hypertensive heart disease with heart failure: Secondary | ICD-10-CM | POA: Diagnosis not present

## 2017-05-26 DIAGNOSIS — K219 Gastro-esophageal reflux disease without esophagitis: Secondary | ICD-10-CM | POA: Diagnosis not present

## 2017-05-26 DIAGNOSIS — Z79899 Other long term (current) drug therapy: Secondary | ICD-10-CM | POA: Diagnosis not present

## 2017-05-26 DIAGNOSIS — C770 Secondary and unspecified malignant neoplasm of lymph nodes of head, face and neck: Secondary | ICD-10-CM | POA: Diagnosis not present

## 2017-05-26 DIAGNOSIS — J189 Pneumonia, unspecified organism: Secondary | ICD-10-CM

## 2017-05-26 DIAGNOSIS — C3411 Malignant neoplasm of upper lobe, right bronchus or lung: Secondary | ICD-10-CM

## 2017-05-26 DIAGNOSIS — R631 Polydipsia: Secondary | ICD-10-CM | POA: Insufficient documentation

## 2017-05-26 DIAGNOSIS — J449 Chronic obstructive pulmonary disease, unspecified: Secondary | ICD-10-CM | POA: Insufficient documentation

## 2017-05-26 DIAGNOSIS — E875 Hyperkalemia: Secondary | ICD-10-CM | POA: Diagnosis not present

## 2017-05-26 DIAGNOSIS — Z923 Personal history of irradiation: Secondary | ICD-10-CM | POA: Diagnosis not present

## 2017-05-26 DIAGNOSIS — Z7901 Long term (current) use of anticoagulants: Secondary | ICD-10-CM | POA: Insufficient documentation

## 2017-05-26 DIAGNOSIS — E1165 Type 2 diabetes mellitus with hyperglycemia: Secondary | ICD-10-CM | POA: Insufficient documentation

## 2017-05-26 DIAGNOSIS — Z87891 Personal history of nicotine dependence: Secondary | ICD-10-CM | POA: Insufficient documentation

## 2017-05-26 DIAGNOSIS — I2510 Atherosclerotic heart disease of native coronary artery without angina pectoris: Secondary | ICD-10-CM | POA: Insufficient documentation

## 2017-05-26 DIAGNOSIS — D696 Thrombocytopenia, unspecified: Secondary | ICD-10-CM | POA: Insufficient documentation

## 2017-05-26 DIAGNOSIS — R350 Frequency of micturition: Secondary | ICD-10-CM | POA: Diagnosis present

## 2017-05-26 DIAGNOSIS — R739 Hyperglycemia, unspecified: Secondary | ICD-10-CM

## 2017-05-26 DIAGNOSIS — Z9221 Personal history of antineoplastic chemotherapy: Secondary | ICD-10-CM | POA: Diagnosis not present

## 2017-05-26 DIAGNOSIS — M797 Fibromyalgia: Secondary | ICD-10-CM | POA: Insufficient documentation

## 2017-05-26 DIAGNOSIS — Z7984 Long term (current) use of oral hypoglycemic drugs: Secondary | ICD-10-CM | POA: Insufficient documentation

## 2017-05-26 DIAGNOSIS — E0965 Drug or chemical induced diabetes mellitus with hyperglycemia: Secondary | ICD-10-CM | POA: Insufficient documentation

## 2017-05-26 DIAGNOSIS — T380X5A Adverse effect of glucocorticoids and synthetic analogues, initial encounter: Secondary | ICD-10-CM

## 2017-05-26 DIAGNOSIS — I509 Heart failure, unspecified: Secondary | ICD-10-CM | POA: Diagnosis not present

## 2017-05-26 DIAGNOSIS — Z86711 Personal history of pulmonary embolism: Secondary | ICD-10-CM | POA: Insufficient documentation

## 2017-05-26 LAB — COMPREHENSIVE METABOLIC PANEL
ALK PHOS: 142 U/L — AB (ref 38–126)
ALT: 15 U/L — ABNORMAL LOW (ref 17–63)
ANION GAP: 5 (ref 5–15)
AST: 14 U/L — ABNORMAL LOW (ref 15–41)
Albumin: 3.6 g/dL (ref 3.5–5.0)
BILIRUBIN TOTAL: 0.6 mg/dL (ref 0.3–1.2)
BUN: 30 mg/dL — ABNORMAL HIGH (ref 6–20)
CALCIUM: 10.1 mg/dL (ref 8.9–10.3)
CO2: 28 mmol/L (ref 22–32)
Chloride: 99 mmol/L — ABNORMAL LOW (ref 101–111)
Creatinine, Ser: 1.55 mg/dL — ABNORMAL HIGH (ref 0.61–1.24)
GFR calc non Af Amer: 42 mL/min — ABNORMAL LOW (ref >60)
GFR, EST AFRICAN AMERICAN: 49 mL/min — AB (ref >60)
Glucose, Bld: 555 mg/dL (ref 65–99)
POTASSIUM: 5.5 mmol/L — AB (ref 3.5–5.1)
SODIUM: 132 mmol/L — AB (ref 135–145)
TOTAL PROTEIN: 7.6 g/dL (ref 6.5–8.1)

## 2017-05-26 LAB — URINALYSIS, COMPLETE (UACMP) WITH MICROSCOPIC
BILIRUBIN URINE: NEGATIVE
Bacteria, UA: NONE SEEN
Glucose, UA: >=500 mg/dL — AB
HGB URINE DIPSTICK: NEGATIVE
Ketones, ur: NEGATIVE mg/dL
Leukocytes, UA: NEGATIVE
Nitrite: NEGATIVE
PROTEIN: NEGATIVE mg/dL
Specific Gravity, Urine: 1.027 (ref 1.005–1.030)
Squamous Epithelial / LPF: NONE SEEN
pH: 5 (ref 5.0–8.0)

## 2017-05-26 LAB — CBC
HCT: 44.3 % (ref 40.0–52.0)
HEMOGLOBIN: 14.3 g/dL (ref 13.0–18.0)
MCH: 29.5 pg (ref 26.0–34.0)
MCHC: 32.3 g/dL (ref 32.0–36.0)
MCV: 91.5 fL (ref 80.0–100.0)
Platelets: 111 10*3/uL — ABNORMAL LOW (ref 150–440)
RBC: 4.84 MIL/uL (ref 4.40–5.90)
RDW: 15.5 % — ABNORMAL HIGH (ref 11.5–14.5)
WBC: 7.5 10*3/uL (ref 3.8–10.6)

## 2017-05-26 LAB — BASIC METABOLIC PANEL
ANION GAP: 12 (ref 5–15)
BUN: 30 mg/dL — ABNORMAL HIGH (ref 6–20)
CALCIUM: 10 mg/dL (ref 8.9–10.3)
CHLORIDE: 98 mmol/L — AB (ref 101–111)
CO2: 23 mmol/L (ref 22–32)
Creatinine, Ser: 1.6 mg/dL — ABNORMAL HIGH (ref 0.61–1.24)
GFR calc non Af Amer: 41 mL/min — ABNORMAL LOW (ref >60)
GFR, EST AFRICAN AMERICAN: 47 mL/min — AB (ref >60)
Glucose, Bld: 598 mg/dL (ref 65–99)
POTASSIUM: 5.3 mmol/L — AB (ref 3.5–5.1)
Sodium: 133 mmol/L — ABNORMAL LOW (ref 135–145)

## 2017-05-26 LAB — GLUCOSE, CAPILLARY
GLUCOSE-CAPILLARY: 492 mg/dL — AB (ref 65–99)
GLUCOSE-CAPILLARY: 500 mg/dL — AB (ref 65–99)
GLUCOSE-CAPILLARY: 364 mg/dL — AB (ref 65–99)
Glucose-Capillary: 539 mg/dL (ref 65–99)
Glucose-Capillary: 439 mg/dL — ABNORMAL HIGH (ref 65–99)

## 2017-05-26 LAB — CBC WITH DIFFERENTIAL/PLATELET
BASOS ABS: 0 10*3/uL (ref 0–0.1)
BASOS PCT: 0 %
Eosinophils Absolute: 0 10*3/uL (ref 0–0.7)
Eosinophils Relative: 0 %
HEMATOCRIT: 44.2 % (ref 40.0–52.0)
HEMOGLOBIN: 14.1 g/dL (ref 13.0–18.0)
Lymphocytes Relative: 7 %
Lymphs Abs: 0.6 10*3/uL — ABNORMAL LOW (ref 1.0–3.6)
MCH: 29.7 pg (ref 26.0–34.0)
MCHC: 31.9 g/dL — ABNORMAL LOW (ref 32.0–36.0)
MCV: 93 fL (ref 80.0–100.0)
Monocytes Absolute: 0.6 10*3/uL (ref 0.2–1.0)
Monocytes Relative: 7 %
NEUTROS ABS: 7.2 10*3/uL — AB (ref 1.4–6.5)
NEUTROS PCT: 86 %
Platelets: 103 10*3/uL — ABNORMAL LOW (ref 150–440)
RBC: 4.75 MIL/uL (ref 4.40–5.90)
RDW: 15.7 % — AB (ref 11.5–14.5)
WBC: 8.4 10*3/uL (ref 3.8–10.6)

## 2017-05-26 MED ORDER — SODIUM CHLORIDE 0.9 % IV BOLUS (SEPSIS)
1000.0000 mL | Freq: Once | INTRAVENOUS | Status: AC
  Administered 2017-05-26: 1000 mL via INTRAVENOUS

## 2017-05-26 MED ORDER — OXYCODONE HCL 10 MG PO TABS: 10.0000 mg | ORAL_TABLET | Freq: Four times a day (QID) | ORAL | 0 refills | Status: DC | PRN

## 2017-05-26 MED ORDER — INSULIN ASPART 100 UNIT/ML ~~LOC~~ SOLN
5.0000 [IU] | Freq: Once | SUBCUTANEOUS | Status: AC
  Administered 2017-05-26: 5 [IU] via INTRAVENOUS
  Filled 2017-05-26: qty 1

## 2017-05-26 MED ORDER — SODIUM CHLORIDE 0.9 % IV BOLUS (SEPSIS)
500.0000 mL | Freq: Once | INTRAVENOUS | Status: AC
  Administered 2017-05-26: 500 mL via INTRAVENOUS

## 2017-05-26 NOTE — ED Notes (Addendum)
Sent over from the cancer center - pt with CBG of 550   He has been on 20mg  of prednisone daily for a few weeks  MD changed dose to 10mg  today

## 2017-05-26 NOTE — ED Provider Notes (Signed)
[facility] Emergency Department Provider Note  ____________________________________________   First MD Initiated Contact with Patient 05/26/17 1659     (approximate)  I have reviewed the triage vital signs and the nursing notes.   HISTORY  Chief Complaint Hyperglycemia   HPI Chris Wilkinson is a 76 y.o. male with a history of lung cancer, CHF and diabetes on a steroid course at this time by his oncologist who is presenting to the emergency department with hyperglycemia.  He says that he has been urinating more frequently as well as having a dry mouth and excessive thirst.  He does not report any pain, nausea or vomiting.  Says that he has been compliant with his 500 mg of metformin twice a day.  Says that his sugars are normal in the low 100s.  However, he does admit to eating a lot of sweets lately.  Past Medical History:  Diagnosis Date  . Cancer ([place])    Lung CA  . CHF (congestive heart failure) ([place])   . COPD (chronic obstructive pulmonary disease) ([place])   . Coronary artery disease   . Diabetes mellitus without complication ([place])   . Fibromyalgia   . GERD (gastroesophageal reflux disease)   . Hypertension   . Pulmonary emboli [facility])     Patient Active Problem List   Diagnosis Date Noted  . Encounter for antineoplastic immunotherapy 02/27/2017  . Carpal tunnel syndrome 12/04/2016  . Sprain of wrist 12/04/2016  . Goals of care, counseling/discussion 11/20/2016  . Lung cancer ([place]) 11/04/2016  . HCAP (healthcare-associated pneumonia) 09/22/2016  . Sepsis ([place]) 08/04/2015  . CAP (community acquired pneumonia) 08/04/2015  . COPD (chronic obstructive pulmonary disease) ([place]) 08/04/2015  . Type 2 diabetes mellitus ([place]) 08/04/2015  . CAD (coronary artery disease) 08/04/2015  . HTN (hypertension) 08/04/2015  . GERD (gastroesophageal reflux disease) 08/04/2015  . Bloating 12/04/2014  . Gas 12/04/2014  . Hx of adenomatous polyp of colon 12/04/2014     Past Surgical History:  Procedure Laterality Date  . EYE SURGERY    . HERNIA REPAIR    . IR FLUORO GUIDE PORT INSERTION LEFT  11/07/2016  . REPAIR KNEE LIGAMENT    . TONSILLECTOMY      Prior to Admission medications   Medication Sig Start Date End Date Taking? Authorizing Provider  acetaminophen (TYLENOL) 325 MG tablet Take 2 tablets (650 mg total) by mouth every 6 (six) hours as needed for mild pain (or Fever >/= 101). 09/26/16   [name], MD  albuterol-ipratropium (COMBIVENT) 18-103 MCG/ACT inhaler Inhale 2 puffs into the lungs every 6 (six) hours as needed for wheezing or shortness of breath.    [provider]  apixaban (ELIQUIS) 5 MG TABS tablet Take 1 tablet (5 mg total) by mouth 2 (two) times daily. 04/10/17   Verlon Au, NP  Fluticasone Furoate-Vilanterol 100-25 MCG/INH AEPB Inhale 1 puff into the lungs daily.     [provider]  furosemide (LASIX) 20 MG tablet Take 20 mg daily by mouth.    [provider]  lidocaine-prilocaine (EMLA) cream Apply to affected area once Patient taking differently: Apply 1 application topically once. Apply to affected area once prior to treatment 02/09/17   Sindy Guadeloupe, MD  metFORMIN (GLUCOPHAGE) 500 MG tablet Take 500 mg by mouth 2 (two) times daily.     [provider]  metoprolol tartrate (LOPRESSOR) 25 MG tablet Take 25 mg by mouth 2 (two) times daily.    [provider]  Oxycodone HCl 10 MG TABS Take 1 tablet (10 mg total) every 6 (six) hours as needed by mouth. For pain 05/26/17   Sindy Guadeloupe, MD  predniSONE (DELTASONE) 10 MG tablet Take 1 tablet (10 mg total) by mouth as directed. 5 tablets x 1 week, 4 tablets x 1 week, 3 tablets x 1 week, 2 tablets x 1 week with food 04/28/17   Sindy Guadeloupe, MD  simvastatin (ZOCOR) 10 MG tablet Take 10 mg by mouth every evening.     [provider]    Allergies Patient has no known allergies.  Family History  Problem Relation Age of  Onset  . Prostate cancer Father   . Prostate cancer Brother   . Diabetes Unknown        all 13 siblings  . Hypertension Unknown        all 13 siblings    Social History Social History   Tobacco Use  . Smoking status: Former Smoker    Last attempt to quit: 12/01/2003    Years since quitting: 13.4  . Smokeless tobacco: Never Used  Substance Use Topics  . Alcohol use: Yes    Alcohol/week: 1.2 oz    Types: 2 Shots of liquor per week    Comment: on the weeknd 2 shots  . Drug use: No    Review of Systems  Constitutional: No fever/chills Eyes: No visual changes. ENT: No sore throat. Cardiovascular: Denies chest pain. Respiratory: Denies shortness of breath. Gastrointestinal: No abdominal pain.  No nausea, no vomiting.  No diarrhea.  No constipation. Genitourinary: As above Musculoskeletal: Negative for back pain. Skin: Negative for rash. Neurological: Negative for headaches, focal weakness or numbness.   ____________________________________________   PHYSICAL EXAM:  VITAL SIGNS: ED Triage Vitals  Enc Vitals Group     BP 05/26/17 1305 (!) 96/57     Pulse Rate 05/26/17 1305 (!) 113     Resp 05/26/17 1305 16     Temp 05/26/17 1305 98.7 F (37.1 C)     Temp Source 05/26/17 1305 Oral     SpO2 05/26/17 1305 94 %     Weight 05/26/17 1306 202 lb (91.6 kg)     Height --      Head Circumference --      Peak Flow --      Pain Score --      Pain Loc --      Pain Edu? --      Excl. in [place]? --     Constitutional: Alert and oriented. Well appearing and in no acute distress. Eyes: Conjunctivae are normal.  Head: Atraumatic. Nose: No congestion/rhinnorhea. Mouth/Throat: Mucous membranes are dry. Neck: No stridor.   Cardiovascular: Normal rate, regular rhythm. Grossly normal heart sounds.   Respiratory: Normal respiratory effort.  No retractions. Lungs CTAB. Gastrointestinal: Soft and nontender. No distention.  Musculoskeletal: No lower extremity tenderness nor edema.   No joint effusions. Neurologic:  Normal speech and language. No gross focal neurologic deficits are appreciated. Skin:  Skin is warm, dry and intact. No rash noted. Psychiatric: Mood and affect are normal. Speech and behavior are normal.  ____________________________________________   LABS (all labs ordered are listed, but only abnormal results are displayed)  Labs Reviewed  BASIC METABOLIC PANEL - Abnormal; Notable for the following components:      Result Value   Sodium 133 (*)    Potassium 5.3 (*)    Chloride 98 (*)    Glucose,  Bld 598 (*)    BUN 30 (*)    Creatinine, Ser 1.60 (*)    GFR calc non Af Amer 41 (*)    GFR calc Af Amer 47 (*)    All other components within normal limits  CBC - Abnormal; Notable for the following components:   RDW 15.5 (*)    Platelets 111 (*)    All other components within normal limits  URINALYSIS, COMPLETE (UACMP) WITH MICROSCOPIC - Abnormal; Notable for the following components:   Color, Urine STRAW (*)    APPearance CLEAR (*)    Glucose, UA >=500 (*)    All other components within normal limits  GLUCOSE, CAPILLARY - Abnormal; Notable for the following components:   Glucose-Capillary 539 (*)    All other components within normal limits  GLUCOSE, CAPILLARY - Abnormal; Notable for the following components:   Glucose-Capillary 492 (*)    All other components within normal limits  GLUCOSE, CAPILLARY - Abnormal; Notable for the following components:   Glucose-Capillary 439 (*)    All other components within normal limits  GLUCOSE, CAPILLARY - Abnormal; Notable for the following components:   Glucose-Capillary 500 (*)    All other components within normal limits  GLUCOSE, CAPILLARY - Abnormal; Notable for the following components:   Glucose-Capillary 364 (*)    All other components within normal limits  CBG MONITORING, ED  CBG MONITORING, ED    ____________________________________________  EKG   ____________________________________________  RADIOLOGY   ____________________________________________   PROCEDURES  Procedure(s) performed:   Procedures  Critical Care performed:   ____________________________________________   INITIAL IMPRESSION / ASSESSMENT AND PLAN / ED COURSE  Pertinent labs & imaging results that were available during my care of the patient were reviewed by me and considered in my medical decision making (see chart for details).  DDX: Hyperglycemia, polydipsia, polyuria, DKA, hyperkalemia, acute renal failure  As part of my medical decision making, I reviewed the following data within the [place] chart reviewed  Patient after 1 L of fluid has a low blood sugar in the 430s.  We will give a dose of insulin.  I discussed with the patient to reduce his intake of carbohydrates and sweets as well as the possibility of doubling his metformin to 1000 mg twice daily while he is on the steroid and then going back to his normal dose once he has finished his steroid course at the end of this week.  We will recheck his glucose after he receives his 5 units of aspart.  ----------------------------------------- 7:30 PM on 05/26/2017 -----------------------------------------  Patient at this time with glucose of 364.  Plan to discharge the patient.  We discussed dietary modification.  The patient will drink water and not juice.  He will reduce his sweets and will eat vegetables.  We will also increase his metformin dose to 1000 mg twice daily for the duration of his steroids which is over the next week.  He knows to resume his normal dose of metformin once his steroids are completed.  He will be following up with his oncologist, Dr. Janese Banks.  Likely elevated potassium and decreased renal function secondary to dehydration and hyperglycemia.  Given fluids as well as potassium which I feel confident  about treating the potassium and kidney function.  I do feel it is important that the patient stays on the steroids because of complications secondary to his chemotherapy.      ____________________________________________   FINAL CLINICAL IMPRESSION(S) / ED  DIAGNOSES  Hyperglycemia secondary to steroid use    NEW MEDICATIONS STARTED DURING THIS VISIT:  This SmartLink is deprecated. Use AVSMEDLIST instead to display the medication list for a patient.   Note:  This document was prepared using Dragon voice recognition software and may include unintentional dictation errors.     Orbie Pyo, MD 05/26/17 [phone]

## 2017-05-26 NOTE — ED Notes (Addendum)
Pt currently on prednisone , c/o hyperglycemia with polyuria . Pt was sent from cancer center, states check up from cancer free diagnosis

## 2017-05-26 NOTE — ED Notes (Signed)
Pt uprite on stretcher in exam room, watching TV, no distress noted; no c/o voiced; 580ml bolus NS completed

## 2017-05-26 NOTE — ED Notes (Signed)
SL d/c'd from left wrist--cannula intact, dressing applied

## 2017-05-26 NOTE — ED Notes (Signed)
Dr. Schaevitz at bedside.  

## 2017-05-26 NOTE — Progress Notes (Signed)
Patient here for follow up with labs. He states that although he is feeling better than at his last visit, he has a cold and has been coughing for several days. His throat is also a little sore and his side hurts from coughing. His oxygen saturation was 87% when first put in the exam room, but went up to 94% after sitting for a few minutes. He states that he is urinating a lot more than usual since Dr. Doroteo Bradford, MD started him on Metoprolol.

## 2017-05-26 NOTE — Progress Notes (Signed)
Hematology/Oncology Consult note 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 Patient is a 76 year old African-American male with a past medical history significant for COPD for which she sees Dr. Humphrey Rolls. He was recently admitted to the hospital in March 2018 with symptoms of pneumonia. CT thorax at that time showed: Perihilar lung mass with associated postobstructive atelectasis and consolidation of the right upper lobe  2. This was followed by a PET CT scan on 10/10/2016 which showed:1. Hypermetabolic RIGHT hilar mass with postobstructive collapse of the RIGHT upper lobe consistent with primary or metastatic bronchogenic carcinoma. 2. Hypermetabolic RIGHT paratracheal, prevascular and RIGHT supraclavicular nodal metastasis. 3. RIGHT supraclavicular node is intensely metabolic but likely too small for biopsy. 4. RIGHT lower lobe peripheral nodular thickening with associated metabolic activity is likely inflammatory. 5. Severe bullous change in the LEFT hemithorax  3. CT abdomen from 10/10/2016 showed no evidence of metastatic disease.  4. 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 Patient has problems with bilateral lower extremity swelling as well as shortness of breath on minimal exertion. He uses nighttime oxygen. He follows up with Dr. Floyce Stakes pulmonary as well as cardiology  6. Concurrent chemo/RT started on 11/13/16. Patient completed 5 weekly cycles of chemo/RT. He then had a gap of 3 weeks and restarted radiation boost for 3 weeks  with concurrent chemo/RT. Interim scans after 5 weekly cycles showed partial response to treatment  7. Maintenance durvalumab started in august 2018 and patient has received 4 doses. He then presented with symptoms of SOB. CT showed evidence of pneumonitis- radiation induced versus durvalumab related. Durvalumab was put on hold and patient started   Interval history- reports that his sob is much improved and so are his energy levels. Leg swelling is stable. Denies any complaints today  ECOG PS- 1 Pain scale- 0 Opioid associated constipation- no  Review of systems- Review of Systems  Constitutional: Negative for chills, fever, malaise/fatigue and weight loss.  HENT: Negative for congestion, ear discharge and nosebleeds.   Eyes: Negative for blurred vision.  Respiratory: Negative for cough, hemoptysis, sputum production, shortness of breath and wheezing.   Cardiovascular: Positive for leg swelling. Negative for chest pain, palpitations, orthopnea and claudication.  Gastrointestinal: Negative for abdominal pain, blood in stool, constipation, diarrhea, heartburn, melena, nausea and vomiting.  Genitourinary: Negative for dysuria, flank pain, frequency, hematuria and urgency.  Musculoskeletal: Negative for back pain, joint pain and myalgias.  Skin: Negative for rash.  Neurological: Negative for dizziness, tingling, focal weakness, seizures, weakness and headaches.  Endo/Heme/Allergies: Does not bruise/bleed easily.  Psychiatric/Behavioral: Negative for depression and suicidal ideas. The patient does not have insomnia.       No Known Allergies   Past Medical History:  Diagnosis Date  . Cancer ([place])    Lung CA  . CHF (congestive heart failure) ([place])   . COPD (chronic obstructive pulmonary disease) ([place])   . Coronary artery disease   . Diabetes mellitus without complication ([place])   . Fibromyalgia   . GERD (gastroesophageal reflux disease)   . Hypertension   . Pulmonary  emboli [facility])        Past Surgical History:  Procedure Laterality Date  . EYE SURGERY    . HERNIA REPAIR    . IR FLUORO GUIDE PORT INSERTION LEFT  11/07/2016  . REPAIR KNEE LIGAMENT    . TONSILLECTOMY      Social History   Socioeconomic History  . Marital status: Married    Spouse name: Not on file  . Number of children: Not on file  . Years of education: Not on file  . Highest education level: Not on file  Social Needs  . Financial resource strain: Not on file  . Food insecurity - worry: Not on file  . Food insecurity - inability: Not on file  . Transportation needs - medical: Not on file  . Transportation needs - non-medical: Not on file  Occupational History  . Not on file  Tobacco Use  . Smoking status: Former Smoker    Last attempt to quit: 12/01/2003    Years since quitting: 13.4  . Smokeless tobacco: Never Used  Substance and Sexual Activity  . Alcohol use: Yes    Alcohol/week: 1.2 oz    Types: 2 Shots of liquor per week    Comment: on the weeknd 2 shots  . Drug use: No  . Sexual activity: Not on file  Other Topics Concern  . Not on file  Social History Narrative  . Not on file    Family History  Problem Relation Age of Onset  . Prostate cancer Father   . Prostate cancer Brother   . Diabetes Unknown        all 13 siblings  . Hypertension Unknown        all 13 siblings     Current Outpatient Medications:  .  acetaminophen (TYLENOL) 325 MG tablet, Take 2 tablets (650 mg total) by mouth every 6 (six) hours as needed for mild pain (or Fever >/= 101)., Disp: , Rfl:  .  albuterol-ipratropium (COMBIVENT) 18-103 MCG/ACT inhaler, Inhale 2 puffs into the lungs every 6 (six) hours as needed for wheezing or shortness of breath., Disp: , Rfl:  .  apixaban (ELIQUIS) 5 MG TABS tablet, Take 1 tablet (5 mg total) by mouth 2 (two) times daily., Disp: 60 tablet, Rfl: 2 .  Fluticasone Furoate-Vilanterol 100-25 MCG/INH AEPB, Inhale 1 puff into the lungs daily. , Disp: , Rfl:  .   furosemide (LASIX) 40 MG tablet, Take 40 mg by mouth daily., Disp: , Rfl:  .  lidocaine-prilocaine (EMLA) cream, Apply to affected area once (Patient taking differently: Apply 1 application topically once. Apply to affected area once prior to treatment), Disp: 30 g, Rfl: 3 .  metFORMIN (GLUCOPHAGE) 500 MG tablet, Take 500 mg by mouth 2 (two) times daily. , Disp: , Rfl:  .  metoprolol tartrate (LOPRESSOR) 25 MG tablet, Take 25 mg by mouth 2 (two) times daily., Disp: , Rfl:  .  omeprazole (PRILOSEC) 40 MG capsule, Take 1 capsule (40 mg total) by mouth daily., Disp: 30 capsule, Rfl: 1 .  Oxycodone HCl 10 MG TABS, Take 1 tablet (10 mg total) by mouth every 6 (six) hours as needed., Disp: 30 tablet, Rfl: 0 .  predniSONE (DELTASONE) 10 MG tablet, Take 1 tablet (10 mg total) by mouth as directed. 5 tablets x 1 week, 4 tablets x 1 week, 3 tablets x 1 week, 2 tablets x 1 week with food, Disp: 98 tablet, Rfl: 0 .  simvastatin (ZOCOR) 10 MG tablet, Take  10 mg by mouth every evening. , Disp: , Rfl:  No current facility-administered medications for this visit.   Facility-Administered Medications Ordered in Other Visits:  .  sodium chloride flush (NS) 0.9 % injection 10 mL, 10 mL, Intravenous, PRN, Sindy Guadeloupe, MD, 10 mL at 01/30/17 2094  Physical exam:  Vitals:   05/26/17 1158  BP: 112/76  Pulse: (!) 103  Temp: 98.7 F (37.1 C)  TempSrc: Tympanic  Weight: 202 lb (91.6 kg)   Physical Exam  Constitutional: He is oriented to person, place, and time and well-developed, well-nourished, and in no distress.  HENT:  Head: Normocephalic and atraumatic.  Eyes: EOM are normal. Pupils are equal, round, and reactive to light.  Neck: Normal range of motion.  Cardiovascular: Normal rate, regular rhythm and normal heart sounds.  Pulmonary/Chest: Effort normal and breath sounds normal.  Abdominal: Soft. Bowel sounds are normal.  Musculoskeletal: He exhibits edema.  Neurological: He is alert and oriented to  person, place, and time.  Skin: Skin is warm and dry.     CMP Latest Ref Rng & Units 05/26/2017  Glucose 65 - 99 mg/dL 555(HH)  BUN 6 - 20 mg/dL 30(H)  Creatinine 0.61 - 1.24 mg/dL 1.55(H)  Sodium 135 - 145 mmol/L 132(L)  Potassium 3.5 - 5.1 mmol/L 5.5(H)  Chloride 101 - 111 mmol/L 99(L)  CO2 22 - 32 mmol/L 28  Calcium 8.9 - 10.3 mg/dL 10.1  Total Protein 6.5 - 8.1 g/dL 7.6  Total Bilirubin 0.3 - 1.2 mg/dL 0.6  Alkaline Phos 38 - 126 U/L 142(H)  AST 15 - 41 U/L 14(L)  ALT 17 - 63 U/L 15(L)   CBC Latest Ref Rng & Units 05/26/2017  WBC 3.8 - 10.6 K/uL 8.4  Hemoglobin 13.0 - 18.0 g/dL 14.1  Hematocrit 40.0 - 52.0 % 44.2  Platelets 150 - 440 K/uL 103(L)      Assessment and plan- Patient is a 76 y.o. male with SCC of RUL Stage IIIB T1N3M0. 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 Symptoms much improved after we started steroids. However his blood sugar is 555 today. Decrease prednisone to 10 mg from 20 mg. He will take that for 1 week and then stop. I will see him back in 3 weeks time and get repeat CXR  Hyperglycemia- likely steroid induced. He also has mild hyperkalemia and elevated creatinine as compared to baseline. I have asked him to go to ER for his hyperglycemia. We will also touch base with his pcp's office regarding follow up with him after ER vsiit today  Lung cancer- unclear if pneumonitis secondary to radiation or durvalumab. Patient has baseline severe COPD and poor pulmonary reserve. I will therefore not re challenge him with durvalumab at this time. Plan to get repeat ct chest abdomen and plevis in Jan 2019 for surveillance  Mild thrombocytopenia- continue to monitor  Continue eliquis for PE  Patient was taking oxycodone for his throat pain but now mainly uses it for chronic back pain. I have therefore asked him to come off opioids and I will be giving only 1  month of oxycodone and no further  Visit Diagnosis 1. Malignant neoplasm of upper lobe of right lung ([place])   2. Pneumonitis      Dr. Randa Evens, MD, MPH [facility] at [facility] Pager- [phone] 05/26/2017 12:43 PM

## 2017-05-26 NOTE — Telephone Encounter (Signed)
Lab/MD schd for 3 weeks, per 05/26/17 los.  Patient bypassed scheduling/check out area. Copy of appt schd mailed to patient.

## 2017-05-26 NOTE — ED Triage Notes (Signed)
PT to ED after PCP sent pt for hyperglycemia. Pt has been on steroids for the past month to help with SOB pt reports he was experiencing after cancer treatment. Pt reports he is cancer free but remains on the steroids. Pt reports he has been urinating more than normal and has been thirsty for the past 2 days. Pt rep[orts his sugars today have been in the 500s.

## 2017-06-12 ENCOUNTER — Other Ambulatory Visit: Payer: Self-pay | Admitting: *Deleted

## 2017-06-12 DIAGNOSIS — C3411 Malignant neoplasm of upper lobe, right bronchus or lung: Secondary | ICD-10-CM

## 2017-06-12 MED ORDER — OXYCODONE HCL 10 MG PO TABS: 10.0000 mg | ORAL_TABLET | Freq: Three times a day (TID) | ORAL | 0 refills | Status: DC | PRN

## 2017-06-12 NOTE — Telephone Encounter (Signed)
Per pt's wife, pt continues to have back pain. Pt's wife made aware that pt is to start to wean off oxycodone.

## 2017-06-12 NOTE — Telephone Encounter (Signed)
Is this back pain? I will refill this time but do not time and again. He needs to try to wean him off oxycodone. Prescribe 10 mg Q8 prn

## 2017-06-12 NOTE — Telephone Encounter (Signed)
Pt's wife called in to request refill of oxycodone. States that pt continues to have pain in the morning when waking and has to take pain medication.  Please advise.

## 2017-06-12 NOTE — Telephone Encounter (Signed)
Yeah. He needs to bring this up with pcp. I cannot prescribe oxycodone for non malignancy related back pain

## 2017-06-12 NOTE — Telephone Encounter (Signed)
Prescription given to pt's wife while in clinic. Informed pt's wife that oxycodone will no longer be refilled by Dr. Janese Banks. Instructed pt's wife that if pt desires a refill then would need to request it from his PCP. Pt's wife verbalized understanding.

## 2017-06-15 ENCOUNTER — Other Ambulatory Visit: Payer: Self-pay | Admitting: *Deleted

## 2017-06-15 DIAGNOSIS — C3411 Malignant neoplasm of upper lobe, right bronchus or lung: Secondary | ICD-10-CM

## 2017-06-15 NOTE — Progress Notes (Addendum)
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 Patient is a 76 year old African-American male with a past medical history significant for COPD for which she sees Dr. Humphrey Rolls. He was recently admitted to the hospital in March 2018 with symptoms of pneumonia. CT thorax at that time showed: Perihilar lung mass with associated postobstructive atelectasis and consolidation of the right upper lobe  2. This was followed by a PET CT scan on 10/10/2016 which showed:1. Hypermetabolic RIGHT hilar mass with postobstructive collapse of the RIGHT upper lobe consistent with primary or metastatic bronchogenic carcinoma. 2. Hypermetabolic RIGHT paratracheal, prevascular and RIGHT supraclavicular nodal metastasis. 3. RIGHT supraclavicular node is intensely metabolic but likely too small for biopsy. 4. RIGHT lower lobe peripheral nodular thickening with associated metabolic activity is likely inflammatory. 5. Severe bullous change in the LEFT hemithorax  3. CT abdomen from 10/10/2016 showed no evidence of metastatic disease.  4. 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 Patient has problems with bilateral lower extremity swelling as well as shortness of breath on minimal exertion. He uses nighttime oxygen. He follows up with Dr. Floyce Stakes pulmonary as well as cardiology  6. Concurrent chemo/RT started on 11/13/16. Patient completed 5 weekly cycles of chemo/RT. He then had a gap of 3 weeks and restarted radiation  boost for 3 weeks with concurrent chemo/RT. Interim scans after 5 weekly cycles showed partial response to treatment  7. Maintenance durvalumab started in august 2018 and patient has received 4 doses. He then presented with symptoms of SOB. CT showed evidence of pneumonitis- radiation induced versus durvalumab related. Durvalumab was put on hold and patient started    Interval history-patient was recently started on amiodarone by Dr. Humphrey Rolls a couple of days ago for tachycardia and irregular heart rate.  Patient reports that he felt better when he was on steroids and now that he is off steroids he feels fatigued again and short of breath on walking short distances.  ECOG PS- 1 Pain scale- 0 Opioid associated constipation- no  Review of systems- Review of Systems  Constitutional: Positive for malaise/fatigue. Negative for chills, fever and weight loss.  HENT: Negative for congestion, ear discharge and nosebleeds.   Eyes: Negative for blurred vision.  Respiratory: Positive for shortness of breath. Negative for cough, hemoptysis, sputum production and wheezing.   Cardiovascular: Negative for chest pain, palpitations, orthopnea and claudication.  Gastrointestinal: Negative for abdominal pain, blood in stool, constipation, diarrhea, heartburn, melena, nausea and vomiting.  Genitourinary: Negative for dysuria, flank pain, frequency, hematuria and urgency.  Musculoskeletal: Negative for back pain, joint pain and myalgias.  Skin: Negative for rash.  Neurological: Positive for weakness. Negative for dizziness, tingling, focal weakness, seizures and headaches.  Endo/Heme/Allergies: Does not bruise/bleed easily.  Psychiatric/Behavioral: Negative for depression and suicidal ideas. The patient does not have insomnia.      No Known Allergies   Past Medical History:  Diagnosis Date  . Cancer ([place])    Lung CA  . CHF (congestive heart failure) ([place])   . COPD (chronic obstructive pulmonary disease)  (  [place])   . Coronary artery disease   . Diabetes mellitus without complication ([place])   . Fibromyalgia   . GERD (gastroesophageal reflux disease)   . Hypertension   . Pulmonary emboli [facility])      Past Surgical History:  Procedure Laterality Date  . COLONOSCOPY WITH PROPOFOL N/A 01/18/2015   Procedure: COLONOSCOPY WITH PROPOFOL;  Surgeon: Manya Silvas, MD;  Location: [facility] ENDOSCOPY;  Service: Endoscopy;  Laterality: N/A;  . ESOPHAGOGASTRODUODENOSCOPY N/A 01/18/2015   Procedure: ESOPHAGOGASTRODUODENOSCOPY (EGD);  Surgeon: Manya Silvas, MD;  Location: [facility] ENDOSCOPY;  Service: Endoscopy;  Laterality: N/A;  . EYE SURGERY    . FLEXIBLE BRONCHOSCOPY N/A 10/22/2016   Procedure: FLEXIBLE BRONCHOSCOPY;  Surgeon: Allyne Gee, MD;  Location: [facility] ORS;  Service: Pulmonary;  Laterality: N/A;  . HERNIA REPAIR    . IR FLUORO GUIDE PORT INSERTION LEFT  11/07/2016  . REPAIR KNEE LIGAMENT    . TONSILLECTOMY      Social History   Socioeconomic History  . Marital status: Married    Spouse name: Not on file  . Number of children: Not on file  . Years of education: Not on file  . Highest education level: Not on file  Social Needs  . Financial resource strain: Not on file  . Food insecurity - worry: Not on file  . Food insecurity - inability: Not on file  . Transportation needs - medical: Not on file  . Transportation needs - non-medical: Not on file  Occupational History  . Not on file  Tobacco Use  . Smoking status: Former Smoker    Last attempt to quit: 12/01/2003    Years since quitting: 13.5  . Smokeless tobacco: Never Used  Substance and Sexual Activity  . Alcohol use: Yes    Alcohol/week: 1.2 oz    Types: 2 Shots of liquor per week    Comment: on the weeknd 2 shots  . Drug use: No  . Sexual activity: Not on file  Other Topics Concern  . Not on file  Social History Narrative  . Not on file    Family History  Problem Relation Age of Onset  . Prostate cancer Father   .  Prostate cancer Brother   . Diabetes Unknown        all 13 siblings  . Hypertension Unknown        all 13 siblings     Current Outpatient Medications:  .  acetaminophen (TYLENOL) 325 MG tablet, Take 2 tablets (650 mg total) by mouth every 6 (six) hours as needed for mild pain (or Fever >/= 101)., Disp: , Rfl:  .  albuterol-ipratropium (COMBIVENT) 18-103 MCG/ACT inhaler, Inhale 2 puffs into the lungs every 6 (six) hours as needed for wheezing or shortness of breath., Disp: , Rfl:  .  apixaban (ELIQUIS) 5 MG TABS tablet, Take 1 tablet (5 mg total) by mouth 2 (two) times daily., Disp: 60 tablet, Rfl: 2 .  Fluticasone Furoate-Vilanterol 100-25 MCG/INH AEPB, Inhale 1 puff into the lungs daily. , Disp: , Rfl:  .  furosemide (LASIX) 20 MG tablet, Take 20 mg daily by mouth., Disp: , Rfl:  .  lidocaine-prilocaine (EMLA) cream, Apply to affected area once (Patient taking differently: Apply 1 application topically once. Apply to affected area once prior to treatment), Disp: 30 g, Rfl: 3 .  metFORMIN (GLUCOPHAGE) 500 MG tablet, Take 500 mg by mouth 2 (two) times daily. , Disp: , Rfl:  .  metoprolol tartrate (LOPRESSOR) 25  MG tablet, Take 25 mg by mouth 2 (two) times daily., Disp: , Rfl:  .  Oxycodone HCl 10 MG TABS, Take 1 tablet (10 mg total) by mouth every 8 (eight) hours as needed. For pain, Disp: 30 tablet, Rfl: 0 .  predniSONE (DELTASONE) 10 MG tablet, Take 1 tablet (10 mg total) by mouth as directed. 5 tablets x 1 week, 4 tablets x 1 week, 3 tablets x 1 week, 2 tablets x 1 week with food, Disp: 98 tablet, Rfl: 0 .  simvastatin (ZOCOR) 10 MG tablet, Take 10 mg by mouth every evening. , Disp: , Rfl:  No current facility-administered medications for this visit.   Facility-Administered Medications Ordered in Other Visits:  .  sodium chloride flush (NS) 0.9 % injection 10 mL, 10 mL, Intravenous, PRN, Sindy Guadeloupe, MD, 10 mL at 01/30/17 0958  Physical exam:  Vitals:   06/16/17 1132 06/16/17 1134    BP:  115/75  Pulse:  (!) 129  Resp:  (!) 22  Temp: 98.7 F (37.1 C)   TempSrc: Tympanic   SpO2:  93%  Weight: 197 lb (89.4 kg)    Physical Exam  Constitutional: He is oriented to person, place, and time and well-developed, well-nourished, and in no distress.  HENT:  Head: Normocephalic and atraumatic.  Eyes: EOM are normal. Pupils are equal, round, and reactive to light.  Neck: Normal range of motion.  Cardiovascular: Normal heart sounds.  Tachycardic irregular  Pulmonary/Chest: Effort normal and breath sounds normal.  Abdominal: Soft. Bowel sounds are normal.  Neurological: He is alert and oriented to person, place, and time.  Skin: Skin is warm and dry.     CMP Latest Ref Rng & Units 05/26/2017  Glucose 65 - 99 mg/dL 598(HH)  BUN 6 - 20 mg/dL 30(H)  Creatinine 0.61 - 1.24 mg/dL 1.60(H)  Sodium 135 - 145 mmol/L 133(L)  Potassium 3.5 - 5.1 mmol/L 5.3(H)  Chloride 101 - 111 mmol/L 98(L)  CO2 22 - 32 mmol/L 23  Calcium 8.9 - 10.3 mg/dL 10.0  Total Protein 6.5 - 8.1 g/dL -  Total Bilirubin 0.3 - 1.2 mg/dL -  Alkaline Phos 38 - 126 U/L -  AST 15 - 41 U/L -  ALT 17 - 63 U/L -   CBC Latest Ref Rng & Units 05/26/2017  WBC 3.8 - 10.6 K/uL 7.5  Hemoglobin 13.0 - 18.0 g/dL 14.3  Hematocrit 40.0 - 52.0 % 44.3  Platelets 150 - 440 K/uL 111(L)     Assessment and plan- Patient is a 76 y.o. male Stage IIIB T1N3M0. 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  He will be getting repeat CT scan for lung cancer surveillance next month and we will again assess his pneumonitis at that time.  Symptomatically he is better after receiving steroids.  He is requesting long-term steroids as it made him feel better but I explained to him that I would not be able to give him lifelong steroids.  Lung cancer- last scans showed partial response. Was on maintenance durvalumab which was stopped due  to concerns of pneumonitis which were more consistent with radiation pneumonitis. Given his baseline poor respiratory reserve due to copd, PE and lung cancer- plan is to hold off on durvalumab at this time. Surveillance scans ordered in January I have also requested him to touch base with Dr. Humphrey Rolls his pulmonologist as his respiratory symptoms could also be because  of his underlying COPD  Continue Eliquis for PE.  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 I will not be prescribing any more oxycodone for non malignant pain. Patient verbalized understanding     Visit Diagnosis 1. Malignant neoplasm of upper lobe of right lung ([place])   2. Radiation pneumonitis ([place])      Dr. Randa Evens, MD, MPH [facility] Pager- [phone] 06/15/2017 12:16 PM

## 2017-06-16 ENCOUNTER — Encounter: Payer: Self-pay | Admitting: Oncology

## 2017-06-16 ENCOUNTER — Inpatient Hospital Stay: Payer: Medicare Other

## 2017-06-16 ENCOUNTER — Inpatient Hospital Stay: Payer: Medicare Other | Attending: Oncology | Admitting: Oncology

## 2017-06-16 VITALS — BP 115/75 | HR 129 | Temp 98.7°F | Resp 22 | Wt 197.0 lb

## 2017-06-16 DIAGNOSIS — I2510 Atherosclerotic heart disease of native coronary artery without angina pectoris: Secondary | ICD-10-CM | POA: Insufficient documentation

## 2017-06-16 DIAGNOSIS — C3411 Malignant neoplasm of upper lobe, right bronchus or lung: Secondary | ICD-10-CM | POA: Diagnosis not present

## 2017-06-16 DIAGNOSIS — Z7901 Long term (current) use of anticoagulants: Secondary | ICD-10-CM | POA: Insufficient documentation

## 2017-06-16 DIAGNOSIS — Z923 Personal history of irradiation: Secondary | ICD-10-CM | POA: Insufficient documentation

## 2017-06-16 DIAGNOSIS — Z86711 Personal history of pulmonary embolism: Secondary | ICD-10-CM | POA: Insufficient documentation

## 2017-06-16 DIAGNOSIS — E119 Type 2 diabetes mellitus without complications: Secondary | ICD-10-CM | POA: Insufficient documentation

## 2017-06-16 DIAGNOSIS — J700 Acute pulmonary manifestations due to radiation: Secondary | ICD-10-CM | POA: Diagnosis not present

## 2017-06-16 DIAGNOSIS — K219 Gastro-esophageal reflux disease without esophagitis: Secondary | ICD-10-CM | POA: Diagnosis not present

## 2017-06-16 DIAGNOSIS — I509 Heart failure, unspecified: Secondary | ICD-10-CM | POA: Diagnosis not present

## 2017-06-16 DIAGNOSIS — Z79899 Other long term (current) drug therapy: Secondary | ICD-10-CM | POA: Diagnosis not present

## 2017-06-16 DIAGNOSIS — J449 Chronic obstructive pulmonary disease, unspecified: Secondary | ICD-10-CM | POA: Diagnosis not present

## 2017-06-16 DIAGNOSIS — M545 Low back pain: Secondary | ICD-10-CM

## 2017-06-16 DIAGNOSIS — C770 Secondary and unspecified malignant neoplasm of lymph nodes of head, face and neck: Secondary | ICD-10-CM | POA: Diagnosis not present

## 2017-06-16 DIAGNOSIS — G8929 Other chronic pain: Secondary | ICD-10-CM | POA: Diagnosis not present

## 2017-06-16 DIAGNOSIS — M797 Fibromyalgia: Secondary | ICD-10-CM | POA: Diagnosis not present

## 2017-06-16 DIAGNOSIS — Z9221 Personal history of antineoplastic chemotherapy: Secondary | ICD-10-CM | POA: Insufficient documentation

## 2017-06-16 DIAGNOSIS — I110 Hypertensive heart disease with heart failure: Secondary | ICD-10-CM | POA: Insufficient documentation

## 2017-06-16 DIAGNOSIS — Y842 Radiological procedure and radiotherapy as the cause of abnormal reaction of the patient, or of later complication, without mention of misadventure at the time of the procedure: Secondary | ICD-10-CM | POA: Diagnosis not present

## 2017-06-16 DIAGNOSIS — Z87891 Personal history of nicotine dependence: Secondary | ICD-10-CM | POA: Diagnosis not present

## 2017-06-16 DIAGNOSIS — Z95828 Presence of other vascular implants and grafts: Secondary | ICD-10-CM

## 2017-06-16 LAB — CBC WITH DIFFERENTIAL/PLATELET
BASOS ABS: 0 10*3/uL (ref 0–0.1)
BASOS PCT: 1 %
EOS ABS: 0.1 10*3/uL (ref 0–0.7)
EOS PCT: 1 %
HCT: 36.6 % — ABNORMAL LOW (ref 40.0–52.0)
Hemoglobin: 12.3 g/dL — ABNORMAL LOW (ref 13.0–18.0)
LYMPHS ABS: 1.2 10*3/uL (ref 1.0–3.6)
Lymphocytes Relative: 26 %
MCH: 29.4 pg (ref 26.0–34.0)
MCHC: 33.5 g/dL (ref 32.0–36.0)
MCV: 87.6 fL (ref 80.0–100.0)
Monocytes Absolute: 0.8 10*3/uL (ref 0.2–1.0)
Monocytes Relative: 18 %
Neutro Abs: 2.5 10*3/uL (ref 1.4–6.5)
Neutrophils Relative %: 54 %
PLATELETS: 177 10*3/uL (ref 150–440)
RBC: 4.18 MIL/uL — AB (ref 4.40–5.90)
RDW: 14.5 % (ref 11.5–14.5)
WBC: 4.6 10*3/uL (ref 3.8–10.6)

## 2017-06-16 LAB — BASIC METABOLIC PANEL
Anion gap: 10 (ref 5–15)
BUN: 14 mg/dL (ref 6–20)
CALCIUM: 9.8 mg/dL (ref 8.9–10.3)
CO2: 24 mmol/L (ref 22–32)
CREATININE: 1.39 mg/dL — AB (ref 0.61–1.24)
Chloride: 103 mmol/L (ref 101–111)
GFR, EST AFRICAN AMERICAN: 56 mL/min — AB (ref >60)
GFR, EST NON AFRICAN AMERICAN: 48 mL/min — AB (ref >60)
Glucose, Bld: 177 mg/dL — ABNORMAL HIGH (ref 65–99)
Potassium: 4 mmol/L (ref 3.5–5.1)
SODIUM: 137 mmol/L (ref 135–145)

## 2017-06-16 MED ORDER — HEPARIN SOD (PORK) LOCK FLUSH 100 UNIT/ML IV SOLN
500.0000 [IU] | INTRAVENOUS | Status: AC | PRN
  Administered 2017-06-16: 500 [IU]

## 2017-06-16 MED ORDER — SODIUM CHLORIDE 0.9% FLUSH
10.0000 mL | INTRAVENOUS | Status: AC | PRN
  Administered 2017-06-16: 10 mL
  Filled 2017-06-16: qty 10

## 2017-06-16 NOTE — Progress Notes (Signed)
Patient here for follow up with labs today. He states that he is not feeling that well since he finished his steroids, and he continues to have cough, especially at night. He is short of breath on exertion and has oxygen to use prn.

## 2017-07-01 ENCOUNTER — Telehealth: Payer: Self-pay | Admitting: *Deleted

## 2017-07-01 NOTE — Telephone Encounter (Signed)
Pt's wife called in to request refill of oxycodone. Informed pt's wife that Dr. Janese Banks is no longer prescribing refills for oxycodone and that he will need to contact his PCP for further refills. Pt's wife, Stanton Kidney, verbalized understanding.

## 2017-07-15 ENCOUNTER — Ambulatory Visit
Admission: RE | Admit: 2017-07-15 | Discharge: 2017-07-15 | Disposition: A | Discharge status: Home / Self Care | Payer: Medicare Other | Source: Ambulatory Visit | Attending: Oncology | Admitting: Oncology

## 2017-07-15 ENCOUNTER — Inpatient Hospital Stay: Payer: Medicare Other | Admitting: *Deleted

## 2017-07-15 DIAGNOSIS — I110 Hypertensive heart disease with heart failure: Secondary | ICD-10-CM | POA: Diagnosis not present

## 2017-07-15 DIAGNOSIS — I2510 Atherosclerotic heart disease of native coronary artery without angina pectoris: Secondary | ICD-10-CM | POA: Diagnosis not present

## 2017-07-15 DIAGNOSIS — N281 Cyst of kidney, acquired: Secondary | ICD-10-CM | POA: Insufficient documentation

## 2017-07-15 DIAGNOSIS — K219 Gastro-esophageal reflux disease without esophagitis: Secondary | ICD-10-CM | POA: Insufficient documentation

## 2017-07-15 DIAGNOSIS — Z9221 Personal history of antineoplastic chemotherapy: Secondary | ICD-10-CM | POA: Diagnosis not present

## 2017-07-15 DIAGNOSIS — J439 Emphysema, unspecified: Secondary | ICD-10-CM | POA: Insufficient documentation

## 2017-07-15 DIAGNOSIS — E119 Type 2 diabetes mellitus without complications: Secondary | ICD-10-CM | POA: Insufficient documentation

## 2017-07-15 DIAGNOSIS — I700 Atherosclerosis of aorta: Secondary | ICD-10-CM | POA: Diagnosis not present

## 2017-07-15 DIAGNOSIS — Z86711 Personal history of pulmonary embolism: Secondary | ICD-10-CM | POA: Insufficient documentation

## 2017-07-15 DIAGNOSIS — M797 Fibromyalgia: Secondary | ICD-10-CM | POA: Diagnosis not present

## 2017-07-15 DIAGNOSIS — Z79899 Other long term (current) drug therapy: Secondary | ICD-10-CM | POA: Diagnosis not present

## 2017-07-15 DIAGNOSIS — C3411 Malignant neoplasm of upper lobe, right bronchus or lung: Secondary | ICD-10-CM | POA: Diagnosis present

## 2017-07-15 DIAGNOSIS — Z923 Personal history of irradiation: Secondary | ICD-10-CM | POA: Insufficient documentation

## 2017-07-15 DIAGNOSIS — Z85118 Personal history of other malignant neoplasm of bronchus and lung: Secondary | ICD-10-CM

## 2017-07-15 DIAGNOSIS — J700 Acute pulmonary manifestations due to radiation: Secondary | ICD-10-CM | POA: Insufficient documentation

## 2017-07-15 DIAGNOSIS — Z87891 Personal history of nicotine dependence: Secondary | ICD-10-CM | POA: Diagnosis not present

## 2017-07-15 DIAGNOSIS — M5136 Other intervertebral disc degeneration, lumbar region: Secondary | ICD-10-CM | POA: Insufficient documentation

## 2017-07-15 DIAGNOSIS — I509 Heart failure, unspecified: Secondary | ICD-10-CM | POA: Insufficient documentation

## 2017-07-15 DIAGNOSIS — J449 Chronic obstructive pulmonary disease, unspecified: Secondary | ICD-10-CM | POA: Insufficient documentation

## 2017-07-15 DIAGNOSIS — C770 Secondary and unspecified malignant neoplasm of lymph nodes of head, face and neck: Secondary | ICD-10-CM | POA: Diagnosis not present

## 2017-07-15 LAB — CBC WITH DIFFERENTIAL/PLATELET
Basophils Absolute: 0 10*3/uL (ref 0–0.1)
Basophils Relative: 1 %
EOS ABS: 0.2 10*3/uL (ref 0–0.7)
EOS PCT: 5 %
HCT: 38.1 % — ABNORMAL LOW (ref 40.0–52.0)
Hemoglobin: 12.6 g/dL — ABNORMAL LOW (ref 13.0–18.0)
LYMPHS PCT: 30 %
Lymphs Abs: 1.4 10*3/uL (ref 1.0–3.6)
MCH: 29.1 pg (ref 26.0–34.0)
MCHC: 33 g/dL (ref 32.0–36.0)
MCV: 88.2 fL (ref 80.0–100.0)
MONO ABS: 0.8 10*3/uL (ref 0.2–1.0)
MONOS PCT: 16 %
Neutro Abs: 2.3 10*3/uL (ref 1.4–6.5)
Neutrophils Relative %: 48 %
PLATELETS: 151 10*3/uL (ref 150–440)
RBC: 4.32 MIL/uL — AB (ref 4.40–5.90)
RDW: 15.9 % — AB (ref 11.5–14.5)
WBC: 4.7 10*3/uL (ref 3.8–10.6)

## 2017-07-15 LAB — COMPREHENSIVE METABOLIC PANEL
ALBUMIN: 3.3 g/dL — AB (ref 3.5–5.0)
ALT: 29 U/L (ref 17–63)
AST: 25 U/L (ref 15–41)
Alkaline Phosphatase: 94 U/L (ref 38–126)
Anion gap: 10 (ref 5–15)
BUN: 15 mg/dL (ref 6–20)
CHLORIDE: 106 mmol/L (ref 101–111)
CO2: 25 mmol/L (ref 22–32)
CREATININE: 1.4 mg/dL — AB (ref 0.61–1.24)
Calcium: 9.6 mg/dL (ref 8.9–10.3)
GFR calc Af Amer: 55 mL/min — ABNORMAL LOW (ref >60)
GFR, EST NON AFRICAN AMERICAN: 48 mL/min — AB (ref >60)
GLUCOSE: 149 mg/dL — AB (ref 65–99)
Potassium: 3.9 mmol/L (ref 3.5–5.1)
Sodium: 141 mmol/L (ref 135–145)
Total Bilirubin: 0.5 mg/dL (ref 0.3–1.2)
Total Protein: 8 g/dL (ref 6.5–8.1)

## 2017-07-15 MED ORDER — IOPAMIDOL (ISOVUE-300) INJECTION 61%
100.0000 mL | Freq: Once | INTRAVENOUS | Status: AC | PRN
  Administered 2017-07-15: 100 mL via INTRAVENOUS

## 2017-07-16 ENCOUNTER — Telehealth: Payer: Self-pay | Admitting: Oncology

## 2017-07-16 ENCOUNTER — Inpatient Hospital Stay: Payer: Medicare Other | Attending: Oncology | Admitting: Oncology

## 2017-07-16 ENCOUNTER — Other Ambulatory Visit: Payer: Self-pay

## 2017-07-16 ENCOUNTER — Ambulatory Visit
Admission: RE | Admit: 2017-07-16 | Discharge: 2017-07-16 | Disposition: A | Discharge status: Home / Self Care | Payer: Medicare Other | Source: Ambulatory Visit | Attending: Radiation Oncology | Admitting: Radiation Oncology

## 2017-07-16 ENCOUNTER — Encounter: Payer: Self-pay | Admitting: Oncology

## 2017-07-16 VITALS — BP 132/80 | HR 106 | Temp 98.7°F | Wt 197.8 lb

## 2017-07-16 DIAGNOSIS — Z9221 Personal history of antineoplastic chemotherapy: Secondary | ICD-10-CM | POA: Diagnosis not present

## 2017-07-16 DIAGNOSIS — C770 Secondary and unspecified malignant neoplasm of lymph nodes of head, face and neck: Secondary | ICD-10-CM | POA: Diagnosis not present

## 2017-07-16 DIAGNOSIS — C3411 Malignant neoplasm of upper lobe, right bronchus or lung: Secondary | ICD-10-CM | POA: Diagnosis not present

## 2017-07-16 DIAGNOSIS — J700 Acute pulmonary manifestations due to radiation: Secondary | ICD-10-CM

## 2017-07-16 DIAGNOSIS — R251 Tremor, unspecified: Secondary | ICD-10-CM | POA: Diagnosis not present

## 2017-07-16 DIAGNOSIS — Z923 Personal history of irradiation: Secondary | ICD-10-CM | POA: Diagnosis not present

## 2017-07-16 DIAGNOSIS — R918 Other nonspecific abnormal finding of lung field: Secondary | ICD-10-CM | POA: Diagnosis not present

## 2017-07-16 DIAGNOSIS — Z87891 Personal history of nicotine dependence: Secondary | ICD-10-CM | POA: Insufficient documentation

## 2017-07-16 DIAGNOSIS — R531 Weakness: Secondary | ICD-10-CM | POA: Insufficient documentation

## 2017-07-16 DIAGNOSIS — Z79899 Other long term (current) drug therapy: Secondary | ICD-10-CM

## 2017-07-16 DIAGNOSIS — C3401 Malignant neoplasm of right main bronchus: Secondary | ICD-10-CM | POA: Insufficient documentation

## 2017-07-16 DIAGNOSIS — C3491 Malignant neoplasm of unspecified part of right bronchus or lung: Secondary | ICD-10-CM

## 2017-07-16 DIAGNOSIS — Z85118 Personal history of other malignant neoplasm of bronchus and lung: Secondary | ICD-10-CM

## 2017-07-16 DIAGNOSIS — Z08 Encounter for follow-up examination after completed treatment for malignant neoplasm: Secondary | ICD-10-CM

## 2017-07-16 NOTE — Telephone Encounter (Signed)
Called patient and confirmed appointments. Also, advised patient to pick up 2 bottles of contrast prior to CT appt. Appt schedule mailed to patient.

## 2017-07-16 NOTE — Progress Notes (Signed)
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 Patient is a 77 year old African-American male with a past medical history significant for COPD for which she sees Dr. Humphrey Rolls. He was recently admitted to the hospital in March 2018 with symptoms of pneumonia. CT thorax at that time showed: Perihilar lung mass with associated postobstructive atelectasis and consolidation of the right upper lobe  2. This was followed by a PET CT scan on 10/10/2016 which showed:1. Hypermetabolic RIGHT hilar mass with postobstructive collapse of the RIGHT upper lobe consistent with primary or metastatic bronchogenic carcinoma. 2. Hypermetabolic RIGHT paratracheal, prevascular and RIGHT supraclavicular nodal metastasis. 3. RIGHT supraclavicular node is intensely metabolic but likely too small for biopsy. 4. RIGHT lower lobe peripheral nodular thickening with associated metabolic activity is likely inflammatory. 5. Severe bullous change in the LEFT hemithorax  3. CT abdomen from 10/10/2016 showed no evidence of metastatic disease.  4. 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 Patient has problems with bilateral lower extremity swelling as well as shortness of breath on minimal exertion. He uses nighttime oxygen. He follows up with Dr. Floyce Stakes pulmonary as well as cardiology  6. Concurrent chemo/RT started on 11/13/16. Patient completed 5 weekly cycles of chemo/RT. He then had a gap of 3 weeks and restarted radiation boost for 3 weeks  with concurrent chemo/RT. Interim scans after 5 weekly cycles showed partial response to treatment  7. Maintenance durvalumab started in august 2018 and patient has received 4 doses. He then presented with symptoms of SOB. CT showed evidence of pneumonitis- radiation induced versus durvalumab related. Durvalumab was stopped at that time and patient gets surveillance imaging and on observation only   Interval history- reports feeling fatigued. Often feels shaky on doing routine tasks. SOB is at baseline  ECOG PS- 1 Pain scale- 0   Review of systems- Review of Systems  Constitutional: Positive for malaise/fatigue. Negative for chills, fever and weight loss.  HENT: Negative for congestion, ear discharge and nosebleeds.   Eyes: Negative for blurred vision.  Respiratory: Positive for shortness of breath. Negative for cough, hemoptysis, sputum production and wheezing.   Cardiovascular: Negative for chest pain, palpitations, orthopnea and claudication.  Gastrointestinal: Negative for abdominal pain, blood in stool, constipation, diarrhea, heartburn, melena, nausea and vomiting.  Genitourinary: Negative for dysuria, flank pain, frequency, hematuria and urgency.  Musculoskeletal: Negative for back pain, joint pain and myalgias.  Skin: Negative for rash.  Neurological: Positive for tremors. Negative for dizziness, tingling, focal weakness, seizures, weakness and headaches.  Endo/Heme/Allergies: Does not bruise/bleed easily.  Psychiatric/Behavioral: Negative for depression and suicidal ideas. The patient does not have insomnia.       No Known Allergies   Past Medical History:  Diagnosis Date  . Cancer ([place])    Lung CA  . CHF (congestive heart failure) ([place])   . COPD (chronic obstructive pulmonary disease) ([place])   . Coronary artery disease   . Diabetes mellitus without complication ([place])   . Fibromyalgia   . GERD (gastroesophageal reflux disease)   . Hypertension   .  Pulmonary emboli  [facility])      Past Surgical History:  Procedure Laterality Date  . COLONOSCOPY WITH PROPOFOL N/A 01/18/2015   Procedure: COLONOSCOPY WITH PROPOFOL;  Surgeon: Manya Silvas, MD;  Location: [facility] ENDOSCOPY;  Service: Endoscopy;  Laterality: N/A;  . ESOPHAGOGASTRODUODENOSCOPY N/A 01/18/2015   Procedure: ESOPHAGOGASTRODUODENOSCOPY (EGD);  Surgeon: Manya Silvas, MD;  Location: [facility] ENDOSCOPY;  Service: Endoscopy;  Laterality: N/A;  . EYE SURGERY    . FLEXIBLE BRONCHOSCOPY N/A 10/22/2016   Procedure: FLEXIBLE BRONCHOSCOPY;  Surgeon: Allyne Gee, MD;  Location: [facility] ORS;  Service: Pulmonary;  Laterality: N/A;  . HERNIA REPAIR    . IR FLUORO GUIDE PORT INSERTION LEFT  11/07/2016  . REPAIR KNEE LIGAMENT    . TONSILLECTOMY      Social History   Socioeconomic History  . Marital status: Married    Spouse name: Not on file  . Number of children: Not on file  . Years of education: Not on file  . Highest education level: Not on file  Social Needs  . Financial resource strain: Not on file  . Food insecurity - worry: Not on file  . Food insecurity - inability: Not on file  . Transportation needs - medical: Not on file  . Transportation needs - non-medical: Not on file  Occupational History  . Not on file  Tobacco Use  . Smoking status: Former Smoker    Last attempt to quit: 12/01/2003    Years since quitting: 13.6  . Smokeless tobacco: Never Used  Substance and Sexual Activity  . Alcohol use: Yes    Alcohol/week: 1.2 oz    Types: 2 Shots of liquor per week    Comment: on the weeknd 2 shots  . Drug use: No  . Sexual activity: Not on file  Other Topics Concern  . Not on file  Social History Narrative  . Not on file    Family History  Problem Relation Age of Onset  . Prostate cancer Father   . Prostate cancer Brother   . Diabetes Unknown        all 13 siblings  . Hypertension Unknown        all 13 siblings     Current Outpatient Medications:  .  acetaminophen (TYLENOL) 325  MG tablet, Take 2 tablets (650 mg total) by mouth every 6 (six) hours as needed for mild pain (or Fever >/= 101)., Disp: , Rfl:  .  albuterol-ipratropium (COMBIVENT) 18-103 MCG/ACT inhaler, Inhale 2 puffs into the lungs every 6 (six) hours as needed for wheezing or shortness of breath., Disp: , Rfl:  .  amiodarone (PACERONE) 200 MG tablet, Take 200 mg by mouth daily., Disp: , Rfl:  .  apixaban (ELIQUIS) 5 MG TABS tablet, Take 1 tablet (5 mg total) by mouth 2 (two) times daily., Disp: 60 tablet, Rfl: 2 .  Fluticasone Furoate-Vilanterol 100-25 MCG/INH AEPB, Inhale 1 puff into the lungs daily. , Disp: , Rfl:  .  furosemide (LASIX) 20 MG tablet, Take 20 mg daily by mouth., Disp: , Rfl:  .  lidocaine-prilocaine (EMLA) cream, Apply to affected area once (Patient taking differently: Apply 1 application topically once. Apply to affected area once prior to treatment), Disp: 30 g, Rfl: 3 .  metFORMIN (GLUCOPHAGE) 500 MG tablet, Take 500 mg by mouth 2 (two) times daily. , Disp: , Rfl:  .  metoprolol tartrate (LOPRESSOR) 25 MG tablet, Take 25 mg by mouth 2 (two) times daily., Disp: , Rfl:  .  Oxycodone HCl 10 MG TABS, Take 1 tablet (10 mg total) by mouth every 8 (eight) hours as needed. For pain, Disp: 30 tablet, Rfl: 0 .  simvastatin (ZOCOR) 10 MG tablet, Take 10 mg by mouth every evening. , Disp: , Rfl:  No current facility-administered medications for this visit.   Facility-Administered Medications Ordered in Other Visits:  .  sodium chloride flush (NS) 0.9 % injection 10 mL, 10 mL, Intravenous, PRN, Sindy Guadeloupe, MD, 10 mL at 01/30/17 0958  Physical exam:  Vitals:   07/16/17 1035  BP: 132/80  Pulse: (!) 106  Temp: 98.7 F (37.1 C)  TempSrc: Tympanic  Weight: 197 lb 12.8 oz (89.7 kg)   Physical Exam  Constitutional: He is oriented to person, place, and time and well-developed, well-nourished, and in no distress.  HENT:  Head: Normocephalic and atraumatic.  Eyes: EOM are normal. Pupils are  equal, round, and reactive to light.  Neck: Normal range of motion.  Cardiovascular: Normal rate, regular rhythm and normal heart sounds.  Pulmonary/Chest:  Breath sounds diminished b/l  Abdominal: Soft. Bowel sounds are normal.  Musculoskeletal: He exhibits edema (trace b/l).  Neurological: He is alert and oriented to person, place, and time.  Skin: Skin is warm and dry.     CMP Latest Ref Rng & Units 07/15/2017  Glucose 65 - 99 mg/dL 149(H)  BUN 6 - 20 mg/dL 15  Creatinine 0.61 - 1.24 mg/dL 1.40(H)  Sodium 135 - 145 mmol/L 141  Potassium 3.5 - 5.1 mmol/L 3.9  Chloride 101 - 111 mmol/L 106  CO2 22 - 32 mmol/L 25  Calcium 8.9 - 10.3 mg/dL 9.6  Total Protein 6.5 - 8.1 g/dL 8.0  Total Bilirubin 0.3 - 1.2 mg/dL 0.5  Alkaline Phos 38 - 126 U/L 94  AST 15 - 41 U/L 25  ALT 17 - 63 U/L 29   CBC Latest Ref Rng & Units 07/15/2017  WBC 3.8 - 10.6 K/uL 4.7  Hemoglobin 13.0 - 18.0 g/dL 12.6(L)  Hematocrit 40.0 - 52.0 % 38.1(L)  Platelets 150 - 440 K/uL 151    No images are attached to the encounter.  Ct Chest W Contrast  Result Date: 07/15/2017 CLINICAL DATA:  Followup lung cancer. EXAM: CT CHEST, ABDOMEN, AND PELVIS WITH CONTRAST TECHNIQUE: Multidetector CT imaging of the chest, abdomen and pelvis was performed following the standard protocol during bolus administration of intravenous contrast. CONTRAST:  134m ISOVUE-300 IOPAMIDOL (ISOVUE-300) INJECTION 61% COMPARISON:  12/31/2016 FINDINGS: CT CHEST FINDINGS Cardiovascular: The heart size appears normal. Aortic atherosclerosis. Calcifications within the LAD an RCA coronary artery is noted. No significant pericardial effusion. Mediastinum/Nodes: The trachea appears patent and is midline. Normal appearance of the esophagus. No hilar adenopathy or mediastinal adenopathy. No enlarged supraclavicular or axillary adenopathy. Lungs/Pleura: Small right pleural effusion. Advanced changes of bullous emphysema identified. Paramediastinal masslike  architectural distortion, fibrosis and volume loss involving the right lung is identified. The previous index lesion within the right upper lobe is no longer measurable. New small nodule within the right upper lobe measures 1.1 cm, image 58 of series 4. Large follow-up within the left lung identified as before. Musculoskeletal: There is degenerative disc disease within the thoracic spine. No aggressive lytic or sclerotic bone lesions. CT ABDOMEN PELVIS FINDINGS Hepatobiliary: Calcified granuloma in the lateral segment of left lobe of liver noted. Gallbladder appears within normal limits. No biliary dilatation. Pancreas: The pancreas is normal. Spleen: Normal in size without focal abnormality. Adrenals/Urinary Tract: The adrenal glands are normal.  Bilateral kidney cysts identified. No mass or hydronephrosis. Urinary bladder appears normal. Stomach/Bowel: The stomach and the small bowel loops have a normal course and caliber. No bowel obstruction. Normal appearance of the colon. Vascular/Lymphatic: Aortic atherosclerosis. No aneurysm. No adenopathy within the upper abdomen. No pelvic or inguinal adenopathy identified. Reproductive: Prostate is unremarkable. Other: No abdominal wall hernia or abnormality. No abdominopelvic ascites. Musculoskeletal: No suspicious bone lesions. Degenerative disc disease within the lumbar spine. IMPRESSION: 1. Progressive changes of external beam radiation within the right lung. The primary lung lesion is no longer measurable. There is a new nodule within the posterior right upper lobe which measures 1.1 cm and warrants close attention on follow-up imaging. 2. No evidence for thoracic nodal metastasis or distant metastatic disease. 3. Aortic Atherosclerosis (ICD10-I70.0) and Emphysema (ICD10-J43.9). Two vessel coronary artery calcifications noted. Electronically Signed   By: Kerby Moors M.D.   On: 07/15/2017 14:12   Ct Abdomen Pelvis W Contrast  Result Date: 07/15/2017 CLINICAL  DATA:  Followup lung cancer. EXAM: CT CHEST, ABDOMEN, AND PELVIS WITH CONTRAST TECHNIQUE: Multidetector CT imaging of the chest, abdomen and pelvis was performed following the standard protocol during bolus administration of intravenous contrast. CONTRAST:  1110m ISOVUE-300 IOPAMIDOL (ISOVUE-300) INJECTION 61% COMPARISON:  12/31/2016 FINDINGS: CT CHEST FINDINGS Cardiovascular: The heart size appears normal. Aortic atherosclerosis. Calcifications within the LAD an RCA coronary artery is noted. No significant pericardial effusion. Mediastinum/Nodes: The trachea appears patent and is midline. Normal appearance of the esophagus. No hilar adenopathy or mediastinal adenopathy. No enlarged supraclavicular or axillary adenopathy. Lungs/Pleura: Small right pleural effusion. Advanced changes of bullous emphysema identified. Paramediastinal masslike architectural distortion, fibrosis and volume loss involving the right lung is identified. The previous index lesion within the right upper lobe is no longer measurable. New small nodule within the right upper lobe measures 1.1 cm, image 58 of series 4. Large follow-up within the left lung identified as before. Musculoskeletal: There is degenerative disc disease within the thoracic spine. No aggressive lytic or sclerotic bone lesions. CT ABDOMEN PELVIS FINDINGS Hepatobiliary: Calcified granuloma in the lateral segment of left lobe of liver noted. Gallbladder appears within normal limits. No biliary dilatation. Pancreas: The pancreas is normal. Spleen: Normal in size without focal abnormality. Adrenals/Urinary Tract: The adrenal glands are normal. Bilateral kidney cysts identified. No mass or hydronephrosis. Urinary bladder appears normal. Stomach/Bowel: The stomach and the small bowel loops have a normal course and caliber. No bowel obstruction. Normal appearance of the colon. Vascular/Lymphatic: Aortic atherosclerosis. No aneurysm. No adenopathy within the upper abdomen. No pelvic  or inguinal adenopathy identified. Reproductive: Prostate is unremarkable. Other: No abdominal wall hernia or abnormality. No abdominopelvic ascites. Musculoskeletal: No suspicious bone lesions. Degenerative disc disease within the lumbar spine. IMPRESSION: 1. Progressive changes of external beam radiation within the right lung. The primary lung lesion is no longer measurable. There is a new nodule within the posterior right upper lobe which measures 1.1 cm and warrants close attention on follow-up imaging. 2. No evidence for thoracic nodal metastasis or distant metastatic disease. 3. Aortic Atherosclerosis (ICD10-I70.0) and Emphysema (ICD10-J43.9). Two vessel coronary artery calcifications noted. Electronically Signed   By: TKerby MoorsM.D.   On: 07/15/2017 14:12     Assessment and plan- Patient is a 77y.o. male Stage IIIB T1N3M0. He has completed concurrent chemo/RT with partial responseand 4 cycles of maintenance durvalumab currently on observation due to concern for pneumonitis    I have personally reviewed the CT chest abdomen and pelvis images  independently and I have discussed the findings with the patient.  CT did not reveal any evidence of thoracic metastases and the nodes are distant metastatic disease.  There was a new right upper lobe nodule noted which was 1.1 cm in size.  I will monitor this closely with a repeat CT chest abdomen and pelvis in 3 months time.  If this nodule grows in size we will have her discuss it in tumor board-if he can get SBR T to that area given that he does have evidence of radiation changes in the right lung as well as there was concern for possible radiation pneumonitis in the past   Patient will get a CBC and CMP checked the day prior to his CT scan in 3 months and I will see him after the scans   Visit Diagnosis 1. Malignant neoplasm of upper lobe of right lung ([place])   2. Encounter for follow-up surveillance of lung cancer      Dr. Randa Evens, MD,  [facility] [place] at [facility] Pager- [phone] 07/16/2017 10:49 AM

## 2017-07-16 NOTE — Progress Notes (Signed)
Radiation Oncology Follow up Note  Name: Chris Wilkinson   Date:   07/16/2017 MRN:  [id number] DOB: 07/25/1940    This 77 y.o. male presents to the clinic today for 5 month follow-up status post concurrent chemoradiation for stage IIIB squamous cell carcinoma the right hilum.  REFERRING PROVIDER: McLean-Scocuzza, Olivia Mackie *  HPI: Patient is a 77 year old male now out 5 months having completed concurrent chemoradiation therapy for stage IIIB (T3 N3 M0) squamous cell carcinoma of the right lung. He is seen today in routine follow-up and is doing fair. He states he continues to be tired has weakness in his legs and has a tremor in his upper extremities.. His treatments were followed by maintenancedurvalumab and is currently under observation. CT scan yesterday showed progressive changes of radiation fibrosis in the right lung with the primary lung lesion no longer measurable. There is a new nodule 1.1 cm with follow-up recommended. No evidence of thoracic nodal metastasis or distant metastatic disease noted.    COMPLICATIONS OF TREATMENT: none  FOLLOW UP COMPLIANCE: keeps appointments   PHYSICAL EXAM:  There were no vitals taken for this visit. Thin slightly cachectic male in NAD. Well-developed well-nourished patient in NAD. HEENT reveals PERLA, EOMI, discs not visualized.  Oral cavity is clear. No oral mucosal lesions are identified. Neck is clear without evidence of cervical or supraclavicular adenopathy. Lungs are clear to A&P. Cardiac examination is essentially unremarkable with regular rate and rhythm without murmur rub or thrill. Abdomen is benign with no organomegaly or masses noted. Motor sensory and DTR levels are equal and symmetric in the upper and lower extremities. Cranial nerves II through XII are grossly intact. Proprioception is intact. No peripheral adenopathy or edema is identified. No motor or sensory levels are noted. Crude visual fields are within normal range.  RADIOLOGY  RESULTS: CT scan reviewed and compatible with the above-stated findings  PLAN: Present time patient is fairly stable with stable disease by CT criteria. Not tach concerned about the new lung nodule in a field of significant radiation changes in his chest and agree with observation with repeat imaging in 3-4 months. I discussed the case with medical oncology. Would continue to observe at this point. I have asked to see him back in 4-5 months for follow-up. He continues close follow-up care with medical oncology.  I would like to take this opportunity to thank you for allowing me to participate in the care of your patient.Noreene Filbert, MD

## 2017-07-23 ENCOUNTER — Telehealth: Payer: Self-pay | Admitting: Internal Medicine

## 2017-07-23 NOTE — Telephone Encounter (Signed)
SIGNED CMN FOR OXYGEN PUT INTO AHP BOX TO BE PICKED UP. °

## 2017-07-27 ENCOUNTER — Encounter: Payer: Self-pay | Admitting: Emergency Medicine

## 2017-07-27 ENCOUNTER — Emergency Department
Admission: EM | Admit: 2017-07-27 | Discharge: 2017-07-27 | Disposition: A | Discharge status: Home / Self Care | Payer: Medicare Other | Attending: Emergency Medicine | Admitting: Emergency Medicine

## 2017-07-27 DIAGNOSIS — H1013 Acute atopic conjunctivitis, bilateral: Secondary | ICD-10-CM

## 2017-07-27 DIAGNOSIS — J449 Chronic obstructive pulmonary disease, unspecified: Secondary | ICD-10-CM | POA: Insufficient documentation

## 2017-07-27 DIAGNOSIS — J069 Acute upper respiratory infection, unspecified: Secondary | ICD-10-CM | POA: Diagnosis not present

## 2017-07-27 DIAGNOSIS — Z87891 Personal history of nicotine dependence: Secondary | ICD-10-CM | POA: Insufficient documentation

## 2017-07-27 DIAGNOSIS — Z86718 Personal history of other venous thrombosis and embolism: Secondary | ICD-10-CM | POA: Diagnosis not present

## 2017-07-27 DIAGNOSIS — Z85118 Personal history of other malignant neoplasm of bronchus and lung: Secondary | ICD-10-CM | POA: Diagnosis not present

## 2017-07-27 DIAGNOSIS — I110 Hypertensive heart disease with heart failure: Secondary | ICD-10-CM | POA: Insufficient documentation

## 2017-07-27 DIAGNOSIS — E119 Type 2 diabetes mellitus without complications: Secondary | ICD-10-CM | POA: Diagnosis not present

## 2017-07-27 DIAGNOSIS — Z7901 Long term (current) use of anticoagulants: Secondary | ICD-10-CM | POA: Insufficient documentation

## 2017-07-27 DIAGNOSIS — I509 Heart failure, unspecified: Secondary | ICD-10-CM | POA: Diagnosis not present

## 2017-07-27 DIAGNOSIS — J309 Allergic rhinitis, unspecified: Secondary | ICD-10-CM

## 2017-07-27 DIAGNOSIS — Z79899 Other long term (current) drug therapy: Secondary | ICD-10-CM | POA: Insufficient documentation

## 2017-07-27 DIAGNOSIS — I2510 Atherosclerotic heart disease of native coronary artery without angina pectoris: Secondary | ICD-10-CM | POA: Diagnosis not present

## 2017-07-27 DIAGNOSIS — J029 Acute pharyngitis, unspecified: Secondary | ICD-10-CM | POA: Diagnosis present

## 2017-07-27 DIAGNOSIS — H1045 Other chronic allergic conjunctivitis: Secondary | ICD-10-CM | POA: Diagnosis not present

## 2017-07-27 DIAGNOSIS — Z7984 Long term (current) use of oral hypoglycemic drugs: Secondary | ICD-10-CM | POA: Diagnosis not present

## 2017-07-27 MED ORDER — AMOXICILLIN 500 MG PO CAPS: 500.0000 mg | ORAL_CAPSULE | Freq: Three times a day (TID) | ORAL | 0 refills | Status: DC

## 2017-07-27 MED ORDER — OLOPATADINE HCL 0.2 % OP SOLN: OPHTHALMIC | 0 refills | Status: DC

## 2017-07-27 NOTE — Discharge Instructions (Signed)
And using medication only as directed.  Follow-up with your primary care provider at [facility] medical if any continued problems.  Drink lots of fluids.  Tylenol if needed for body aches.

## 2017-07-27 NOTE — ED Notes (Signed)
FN: pt c/o bilateral eye redness and sore throat.

## 2017-07-27 NOTE — ED Triage Notes (Signed)
Presents with bilateral eye pain and drainage with sore throat which started about 1 week ago

## 2017-07-27 NOTE — ED Provider Notes (Signed)
[facility] Emergency Department Provider Note   ____________________________________________   First MD Initiated Contact with Patient 07/27/17 1119     (approximate)  I have reviewed the triage vital signs and the nursing notes.   HISTORY  Chief Complaint Sore Throat and Eye Pain   HPI Chris Wilkinson is a 77 y.o. male is brought to the ED by family with complaint of cough and congestion.  Patient also states that his eyes are itching and there is been clear drainage from his eyes.  He denies any visual changes.  He also denies any known fever or chills.  He has been taking some over-the-counter medication for nasal congestion.  He also has had a sore throat during his symptoms of upper respiratory.  He rates his pain as 7 out of 10.   Past Medical History:  Diagnosis Date  . Cancer ([place])    Lung CA  . CHF (congestive heart failure) ([place])   . COPD (chronic obstructive pulmonary disease) ([place])   . Coronary artery disease   . Diabetes mellitus without complication ([place])   . Fibromyalgia   . GERD (gastroesophageal reflux disease)   . Hypertension   . Pulmonary emboli [facility])     Patient Active Problem List   Diagnosis Date Noted  . Encounter for antineoplastic immunotherapy 02/27/2017  . Carpal tunnel syndrome 12/04/2016  . Sprain of wrist 12/04/2016  . Goals of care, counseling/discussion 11/20/2016  . Lung cancer ([place]) 11/04/2016  . HCAP (healthcare-associated pneumonia) 09/22/2016  . Sepsis ([place]) 08/04/2015  . CAP (community acquired pneumonia) 08/04/2015  . COPD (chronic obstructive pulmonary disease) ([place]) 08/04/2015  . Type 2 diabetes mellitus ([place]) 08/04/2015  . CAD (coronary artery disease) 08/04/2015  . HTN (hypertension) 08/04/2015  . GERD (gastroesophageal reflux disease) 08/04/2015  . Bloating 12/04/2014  . Gas 12/04/2014  . Hx of adenomatous polyp of colon 12/04/2014    Past Surgical History:  Procedure Laterality Date  .  COLONOSCOPY WITH PROPOFOL N/A 01/18/2015   Procedure: COLONOSCOPY WITH PROPOFOL;  Surgeon: Manya Silvas, MD;  Location: [facility] ENDOSCOPY;  Service: Endoscopy;  Laterality: N/A;  . ESOPHAGOGASTRODUODENOSCOPY N/A 01/18/2015   Procedure: ESOPHAGOGASTRODUODENOSCOPY (EGD);  Surgeon: Manya Silvas, MD;  Location: [facility] ENDOSCOPY;  Service: Endoscopy;  Laterality: N/A;  . EYE SURGERY    . FLEXIBLE BRONCHOSCOPY N/A 10/22/2016   Procedure: FLEXIBLE BRONCHOSCOPY;  Surgeon: Allyne Gee, MD;  Location: [facility] ORS;  Service: Pulmonary;  Laterality: N/A;  . HERNIA REPAIR    . IR FLUORO GUIDE PORT INSERTION LEFT  11/07/2016  . REPAIR KNEE LIGAMENT    . TONSILLECTOMY      Prior to Admission medications   Medication Sig Start Date End Date Taking? Authorizing Provider  acetaminophen (TYLENOL) 325 MG tablet Take 2 tablets (650 mg total) by mouth every 6 (six) hours as needed for mild pain (or Fever >/= 101). Patient not taking: Reported on 07/16/2017 09/26/16   [name], MD  amiodarone (PACERONE) 200 MG tablet Take 200 mg by mouth daily.    [provider]  amoxicillin (AMOXIL) 500 MG capsule Take 1 capsule (500 mg total) by mouth 3 (three) times daily. 07/27/17   Johnn Hai, PA-C  apixaban (ELIQUIS) 5 MG TABS tablet Take 1 tablet (5 mg total) by mouth 2 (two) times daily. 04/10/17   Verlon Au, NP  Fluticasone Furoate-Vilanterol 100-25 MCG/INH AEPB Inhale 1 puff into the lungs daily.     [provider]  furosemide (LASIX) 20 MG tablet Take 20 mg daily by mouth.    [provider]  lidocaine-prilocaine (EMLA) cream Apply to affected area once Patient taking differently: Apply 1 application topically once. Apply to affected area once prior to treatment 02/09/17   Sindy Guadeloupe, MD  metFORMIN (GLUCOPHAGE) 500 MG tablet Take 500 mg by mouth 2 (two) times daily.     [provider]  metoprolol tartrate (LOPRESSOR) 25 MG tablet Take 25 mg by mouth 2 (two) times daily.     [provider]  Olopatadine HCl (PATADAY) 0.2 % SOLN Use one drop to each eye once a day 07/27/17   Johnn Hai, PA-C  simvastatin (ZOCOR) 10 MG tablet Take 10 mg by mouth every evening.     [provider]    Allergies Patient has no known allergies.  Family History  Problem Relation Age of Onset  . Prostate cancer Father   . Prostate cancer Brother   . Diabetes Unknown        all 13 siblings  . Hypertension Unknown        all 13 siblings    Social History Social History   Tobacco Use  . Smoking status: Former Smoker    Last attempt to quit: 12/01/2003    Years since quitting: 13.6  . Smokeless tobacco: Never Used  Substance Use Topics  . Alcohol use: Yes    Alcohol/week: 1.2 oz    Types: 2 Shots of liquor per week    Comment: on the weeknd 2 shots  . Drug use: No    Review of Systems Constitutional: No fever/chills Eyes: No visual changes.  Positive bilateral eye itching. ENT: Positive sore throat.  Positive nasal congestion. Cardiovascular: Denies chest pain. Respiratory: Denies shortness of breath.  Positive nonproductive cough. Gastrointestinal: No abdominal pain.  No nausea, no vomiting.  No diarrhea.   Musculoskeletal: Negative for muscle aches. Skin: Negative for rash. Neurological: Negative for headaches, focal weakness or numbness. ____________________________________________   PHYSICAL EXAM:  VITAL SIGNS: ED Triage Vitals  Enc Vitals Group     BP 07/27/17 1112 126/74     Pulse Rate 07/27/17 1112 86     Resp 07/27/17 1112 18     Temp 07/27/17 1112 98.2 F (36.8 C)     Temp Source 07/27/17 1112 Oral     SpO2 07/27/17 1112 94 %     Weight 07/27/17 1111 195 lb (88.5 kg)     Height 07/27/17 1111 _0  (1.803 m)     Head Circumference --      Peak Flow --      Pain Score 07/27/17 1111 7     Pain Loc --      Pain Edu? --      Excl. in [place]? --    Constitutional: Alert and oriented. Well appearing and in no acute  distress. Eyes: Conjunctivae are normal. PERRL. EOMI. no exudate is noted. Head: Atraumatic. Nose: Moderate congestion/rhinnorhea.  EACs and TMs are clear bilaterally. Mouth/Throat: Mucous membranes are moist.  Oropharynx non-erythematous.  Posterior drainage present. Neck: No stridor.   Hematological/Lymphatic/Immunilogical: No cervical lymphadenopathy. Cardiovascular: Normal rate, regular rhythm. Grossly normal heart sounds.  Good peripheral circulation. Respiratory: Normal respiratory effort.  No retractions. Lungs CTAB. Musculoskeletal: No lower extremity tenderness nor edema.  No joint effusions. Neurologic:  Normal speech and language. No gross focal neurologic deficits are appreciated. No gait instability. Skin:  Skin is warm, dry and intact. No rash noted.  Psychiatric: Mood and affect are normal. Speech and behavior are normal.  ____________________________________________   LABS (all labs ordered are listed, but only abnormal results are displayed)  Labs Reviewed - No data to display   PROCEDURES  Procedure(s) performed: None  Procedures  Critical Care performed: No  ____________________________________________   INITIAL IMPRESSION / ASSESSMENT AND PLAN / ED COURSE Patient was made aware that most likely he has the viral upper respiratory infection that is being seen in this area.  Patient was adamant that he get a prescription for amoxicillin as he is always gotten it from his doctor and from various doctors in the ED for the same symptoms.  His wife agrees.  He was given a prescription for amoxicillin 500 mg 3 times daily for 10 days.  He was also given a prescription for Pataday 1 drop each eye once a day for itching eyes.  He is to follow-up with his doctor at [place] if any continued problems.  ____________________________________________   FINAL CLINICAL IMPRESSION(S) / ED DIAGNOSES  Final diagnoses:  Upper respiratory tract infection, unspecified  type  Allergic conjunctivitis and rhinitis, bilateral     ED Discharge Orders        Ordered    amoxicillin (AMOXIL) 500 MG capsule  3 times daily     07/27/17 1146    Olopatadine HCl (PATADAY) 0.2 % SOLN     07/27/17 1146       Note:  This document was prepared using Dragon voice recognition software and may include unintentional dictation errors.    Johnn Hai, PA-C 07/27/17 1352    Schaevitz, Randall An, MD 07/27/17 [phone]

## 2017-08-11 ENCOUNTER — Inpatient Hospital Stay: Payer: Medicare Other

## 2017-08-11 DIAGNOSIS — C3411 Malignant neoplasm of upper lobe, right bronchus or lung: Secondary | ICD-10-CM | POA: Diagnosis not present

## 2017-08-11 DIAGNOSIS — Z95828 Presence of other vascular implants and grafts: Secondary | ICD-10-CM

## 2017-08-11 MED ORDER — SODIUM CHLORIDE 0.9% FLUSH
10.0000 mL | INTRAVENOUS | Status: AC | PRN
  Administered 2017-08-11: 10 mL
  Filled 2017-08-11: qty 10

## 2017-08-11 MED ORDER — HEPARIN SOD (PORK) LOCK FLUSH 100 UNIT/ML IV SOLN
500.0000 [IU] | INTRAVENOUS | Status: AC | PRN
  Administered 2017-08-11: 500 [IU]

## 2017-08-12 ENCOUNTER — Other Ambulatory Visit: Payer: Self-pay

## 2017-08-12 ENCOUNTER — Encounter: Payer: Self-pay | Admitting: Nurse Practitioner

## 2017-08-12 ENCOUNTER — Ambulatory Visit: Payer: Medicare Other | Attending: Nurse Practitioner | Admitting: Nurse Practitioner

## 2017-08-12 DIAGNOSIS — Z7901 Long term (current) use of anticoagulants: Secondary | ICD-10-CM | POA: Diagnosis not present

## 2017-08-12 DIAGNOSIS — Z86711 Personal history of pulmonary embolism: Secondary | ICD-10-CM | POA: Insufficient documentation

## 2017-08-12 DIAGNOSIS — Z86010 Personal history of colonic polyps: Secondary | ICD-10-CM | POA: Insufficient documentation

## 2017-08-12 DIAGNOSIS — Y95 Nosocomial condition: Secondary | ICD-10-CM | POA: Insufficient documentation

## 2017-08-12 DIAGNOSIS — Z79899 Other long term (current) drug therapy: Secondary | ICD-10-CM

## 2017-08-12 DIAGNOSIS — Z87891 Personal history of nicotine dependence: Secondary | ICD-10-CM | POA: Insufficient documentation

## 2017-08-12 DIAGNOSIS — Z833 Family history of diabetes mellitus: Secondary | ICD-10-CM | POA: Insufficient documentation

## 2017-08-12 DIAGNOSIS — Z789 Other specified health status: Secondary | ICD-10-CM | POA: Diagnosis not present

## 2017-08-12 DIAGNOSIS — G894 Chronic pain syndrome: Secondary | ICD-10-CM

## 2017-08-12 DIAGNOSIS — J440 Chronic obstructive pulmonary disease with acute lower respiratory infection: Secondary | ICD-10-CM | POA: Diagnosis not present

## 2017-08-12 DIAGNOSIS — I130 Hypertensive heart and chronic kidney disease with heart failure and stage 1 through stage 4 chronic kidney disease, or unspecified chronic kidney disease: Secondary | ICD-10-CM | POA: Diagnosis not present

## 2017-08-12 DIAGNOSIS — G8929 Other chronic pain: Secondary | ICD-10-CM

## 2017-08-12 DIAGNOSIS — Z7984 Long term (current) use of oral hypoglycemic drugs: Secondary | ICD-10-CM | POA: Diagnosis not present

## 2017-08-12 DIAGNOSIS — E1122 Type 2 diabetes mellitus with diabetic chronic kidney disease: Secondary | ICD-10-CM | POA: Diagnosis not present

## 2017-08-12 DIAGNOSIS — N189 Chronic kidney disease, unspecified: Secondary | ICD-10-CM | POA: Insufficient documentation

## 2017-08-12 DIAGNOSIS — M542 Cervicalgia: Secondary | ICD-10-CM

## 2017-08-12 DIAGNOSIS — R140 Abdominal distension (gaseous): Secondary | ICD-10-CM | POA: Diagnosis not present

## 2017-08-12 DIAGNOSIS — R6884 Jaw pain: Secondary | ICD-10-CM | POA: Diagnosis not present

## 2017-08-12 DIAGNOSIS — C3490 Malignant neoplasm of unspecified part of unspecified bronchus or lung: Secondary | ICD-10-CM | POA: Diagnosis not present

## 2017-08-12 DIAGNOSIS — R519 Headache, unspecified: Secondary | ICD-10-CM

## 2017-08-12 DIAGNOSIS — M899 Disorder of bone, unspecified: Secondary | ICD-10-CM | POA: Diagnosis not present

## 2017-08-12 DIAGNOSIS — I2510 Atherosclerotic heart disease of native coronary artery without angina pectoris: Secondary | ICD-10-CM | POA: Diagnosis not present

## 2017-08-12 DIAGNOSIS — J189 Pneumonia, unspecified organism: Secondary | ICD-10-CM | POA: Insufficient documentation

## 2017-08-12 DIAGNOSIS — R070 Pain in throat: Secondary | ICD-10-CM | POA: Insufficient documentation

## 2017-08-12 DIAGNOSIS — I509 Heart failure, unspecified: Secondary | ICD-10-CM | POA: Diagnosis not present

## 2017-08-12 DIAGNOSIS — M797 Fibromyalgia: Secondary | ICD-10-CM | POA: Insufficient documentation

## 2017-08-12 DIAGNOSIS — K219 Gastro-esophageal reflux disease without esophagitis: Secondary | ICD-10-CM | POA: Diagnosis not present

## 2017-08-12 DIAGNOSIS — Z8249 Family history of ischemic heart disease and other diseases of the circulatory system: Secondary | ICD-10-CM | POA: Insufficient documentation

## 2017-08-12 DIAGNOSIS — G5600 Carpal tunnel syndrome, unspecified upper limb: Secondary | ICD-10-CM | POA: Insufficient documentation

## 2017-08-12 DIAGNOSIS — Z9889 Other specified postprocedural states: Secondary | ICD-10-CM | POA: Insufficient documentation

## 2017-08-12 DIAGNOSIS — R51 Headache: Secondary | ICD-10-CM | POA: Diagnosis not present

## 2017-08-12 DIAGNOSIS — Z79891 Long term (current) use of opiate analgesic: Secondary | ICD-10-CM

## 2017-08-12 DIAGNOSIS — Z8042 Family history of malignant neoplasm of prostate: Secondary | ICD-10-CM | POA: Insufficient documentation

## 2017-08-12 NOTE — Progress Notes (Signed)
Safety precautions to be maintained throughout the outpatient stay will include: orient to surroundings, keep bed in low position, maintain call bell within reach at all times, provide assistance with transfer out of bed and ambulation.  

## 2017-08-12 NOTE — Progress Notes (Signed)
Patient's Name: Chris Wilkinson  MRN: [id number]  Referring Provider: Danelle Berry, NP  DOB: 12-11-1940  PCP: Danelle Berry, NP  DOS: 08/12/2017  Note by: Dionisio David NP  Service setting: Ambulatory outpatient  Specialty: Interventional Pain Management  Location: [facility] (AMB) Pain Management Facility    Patient type: New Patient    Primary Reason(s) for Visit: Initial Patient Evaluation CC: Oral Swelling (post radiation?) and Jaw Pain (right side)  HPI  Chris Wilkinson is a 77 y.o. year old, male patient, who comes today for an initial evaluation. He has Sepsis ([place]); CAP (community acquired pneumonia); COPD (chronic obstructive pulmonary disease) ([place]); Type 2 diabetes mellitus ([place]); CAD (coronary artery disease); HTN (hypertension); GERD (gastroesophageal reflux disease); HCAP (healthcare-associated pneumonia); Lung cancer ([place]); Goals of care, counseling/discussion; Bloating; Carpal tunnel syndrome; Gas; Hx of adenomatous polyp of colon; Sprain of wrist; Encounter for antineoplastic immunotherapy; Chronic throat pain (Primary Area of Pain); Chronic secondary facial pain (Secondary Area of Pain) (R); Chronic neck pain (Tertiary Area of Pain); Chronic pain syndrome; Other long term (current) drug therapy; Disorder of bone, unspecified; Other specified health status; and Long term current use of opiate analgesic on their problem list.. His primarily concern today is the Oral Swelling (post radiation?) and Jaw Pain (right side)  Pain Assessment: Location:   Throat Radiating: moves to right jaw Onset: More than a month ago Quality: Shooting Severity: 4 /10 (self-reported pain score)  Note: Reported level is compatible with observation.                          Effect on ADL: pt states he cannot eat the kind of food he used to or talk like he used to Timing: Intermittent Modifying factors: oxycodone  Onset and Duration: Present longer than 3 months Cause of pain: Unknown Severity: No change  since onset and NAS-11 at its worse: 5/10 Timing: Night Aggravating Factors: Eating Alleviating Factors: Sleeping Associated Problems: Pain that wakes patient up Quality of Pain: Shooting Previous Examinations or Tests: CT scan and X-rays Previous Treatments: The patient denies any previous treatment  The patient comes into the clinics today for the first time for a chronic pain management evaluation. According to the patient's primary area pain is in his throat. He admits that it does radiate up into his jaw greater on the right. 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 He admits that the pain was worse after the radiation.  He has right sided facial pain the goes up to his right eye. He denies numbness or tingling.   Today I took the time to provide the patient with information regarding this pain practice. The patient was informed that the practice is divided into two sections: an interventional pain management section, as well as a completely separate and distinct medication management section. I explained that there are procedure days for interventional therapies, and evaluation days for follow-ups and medication management. Because of the amount of documentation required during both, they are kept separated. This means that there is the possibility that he may be scheduled for a procedure on one day, and medication management the next. I have also informed himthat because of staffing and facility limitations, this practice will no longer take patients for medication management only. To illustrate the reasons for this, I gave the patient the example of surgeons, and how inappropriate it would be to refer a patient to his care, just to write  for the post-surgical antibiotics on a surgery done by a different surgeon.   Because interventional pain management is part of the board-certified specialty for the doctors, the patient was informed that joining this practice means  that they are open to any and all interventional therapies. I made it clear that this does not mean that they will be forced to have any procedures done. What this means is that I believe interventional therapies to be essential part of the diagnosis and proper management of chronic pain conditions. Therefore, patients not interested in these interventional alternatives will be better served under the care of a different practitioner.  The patient was also made aware of my Comprehensive Pain Management Safety Guidelines where by joining this practice, they limit all of their nerve blocks and joint injections to those done by our practice, for as long as we are retained to manage their care. Historic Controlled Substance Pharmacotherapy Review  PMP and historical list of controlled substances: oxycodone 10 mg, lorazepam 0.5 mg, codeine and guaifenesin 10/100 mg/5 ML suspension, diazepam 2 mg, hydrocodone acetaminophen 5/325 mg, tramadol 50 mg,diazepam 5 mg Highest opioid analgesic regimen found: oxycodone 10 mg 1 tablet 4 times daily (fill date 05/27/2017) oxycodone 40 mg per day Most recent opioid analgesic:oxycodone 10 mg 1 tablet daily(fill date 07/17/2017) oxycodone 10 mg per day Current opioid analgesics:oxycodone 10 mg 1 tablet daily(fill date 07/17/2017) oxycodone 10 mg per day Highest recorded MME/day: 28m/day MME/day: 15 mg/day Medications: The patient did not bring the medication(s) to the appointment, as requested in our "New Patient Package" Pharmacodynamics: Desired effects: Analgesia: The patient reports >50% benefit. Reported improvement in function: The patient reports medication allows him to accomplish basic ADLs. Clinically meaningful improvement in function (CMIF): Sustained CMIF goals met Perceived effectiveness: Described as relatively effective, allowing for increase in activities of daily living (ADL) Undesirable effects: Side-effects or Adverse reactions: None  reported Historical Monitoring: The patient  reports that he does not use drugs. List of all UDS Test(s): No results found for: MDMA, COCAINSCRNUR, PCPSCRNUR, PCPQUANT, CANNABQUANT, THCU, E[place]arn[place] CityList of all Serum Drug Screening Test(s):  No results found for: AMPHSCRSER, BARBSCRSER, BENZOSCRSER, COCAINSCRSER, PCPSCRSER, PCPQUANT, THCSCRSER, CANNABQUANT, OPIATESCRSER, OXYSCRSER, PROPOXSCRSER Historical Background Evaluation: [place] PDMP: Six (6) year initial data search conducted.             [place] Department of public safety, offender search: (Editor, commissioningInformation) Non-contributory Risk Assessment Profile: Aberrant behavior: None observed or detected today Risk factors for fatal opioid overdose: None identified today Fatal overdose hazard ratio (HR): Calculation deferred Non-fatal overdose hazard ratio (HR): Calculation deferred Risk of opioid abuse or dependence: 0.7-3.0% with doses ? 36 MME/day and 6.1-26% with doses ? 120 MME/day. Substance use disorder (SUD) risk level: Pending results of Medical Psychology Evaluation for SUD Opioid risk tool (ORT) (Total Score): 0  ORT Scoring interpretation table:  Score <3 = Low Risk for SUD  Score between 4-7 = Moderate Risk for SUD  Score >8 = High Risk for Opioid Abuse   PHQ-2 Depression Scale:  Total score: 0  PHQ-2 Scoring interpretation table: (Score and probability of major depressive disorder)  Score 0 = No depression  Score 1 = 15.4% Probability  Score 2 = 21.1% Probability  Score 3 = 38.4% Probability  Score 4 = 45.5% Probability  Score 5 = 56.4% Probability  Score 6 = 78.6% Probability   PHQ-9 Depression Scale:  Total score: 0  PHQ-9 Scoring interpretation table:  Score 0-4 = No depression  Score  5-9 = Mild depression  Score 10-14 = Moderate depression  Score 15-19 = Moderately severe depression  Score 20-27 = Severe depression (2.4 times higher risk of SUD and 2.89 times higher risk of overuse)   Pharmacologic Plan: Pending ordered  tests and/or consults  Meds  The patient has a current medication list which includes the following prescription(s): acetaminophen, amiodarone, apixaban, fluticasone furoate-vilanterol, furosemide, metformin, metoprolol tartrate, simvastatin, and prochlorperazine, and the following Facility-Administered Medications: sodium chloride flush.  Current Outpatient Medications on File Prior to Visit  Medication Sig  . acetaminophen (TYLENOL) 325 MG tablet Take 2 tablets (650 mg total) by mouth every 6 (six) hours as needed for mild pain (or Fever >/= 101).  Marland Kitchen amiodarone (PACERONE) 200 MG tablet Take 200 mg by mouth daily.  Marland Kitchen apixaban (ELIQUIS) 5 MG TABS tablet Take 1 tablet (5 mg total) by mouth 2 (two) times daily.  . Fluticasone Furoate-Vilanterol 100-25 MCG/INH AEPB Inhale 1 puff into the lungs daily.   . furosemide (LASIX) 20 MG tablet Take 20 mg daily by mouth.  . metFORMIN (GLUCOPHAGE) 500 MG tablet Take 500 mg by mouth 2 (two) times daily.   . metoprolol tartrate (LOPRESSOR) 25 MG tablet Take 25 mg by mouth 2 (two) times daily.  . simvastatin (ZOCOR) 10 MG tablet Take 10 mg by mouth every evening.   . [DISCONTINUED] prochlorperazine (COMPAZINE) 10 MG tablet Take 1 tablet (10 mg total) by mouth every 6 (six) hours as needed (Nausea or vomiting). (Patient not taking: Reported on 12/04/2016)   Current Facility-Administered Medications on File Prior to Visit  Medication  . sodium chloride flush (NS) 0.9 % injection 10 mL   Imaging Review  Shoulder Imaging:  Shoulder-R DG:  Results for orders placed during the hospital encounter of 03/27/15  DG Shoulder Right   Narrative CLINICAL DATA:  Right shoulder pain for 2 weeks without trauma.  EXAM: RIGHT SHOULDER - 2+ VIEW  COMPARISON:  11/13/2011 MRI  FINDINGS: Degenerative irregularity of the acromioclavicular and glenohumeral joints. Calcific tendinitis involving the rotator cuff insertion.  IMPRESSION: No acute osseous  abnormality.  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  Osteoarthritis.   Electronically Signed   By: Abigail Miyamoto M.D.   On: 03/27/2015 18:01     Note: Available results from prior imaging studies were reviewed.        ROS  Cardiovascular History: Heart trouble, High blood pressure and Blood thinners:  Anticoagulant Pulmonary or Respiratory History: Shortness of breath Neurological History: No reported neurological signs or symptoms such as seizures, abnormal skin sensations, urinary and/or fecal incontinence, being born with an abnormal open spine and/or a tethered spinal cord Review of Past Neurological Studies:  Results for orders placed or performed during the hospital encounter of 11/03/16  CT Head W Wo Contrast   Narrative   CLINICAL DATA:  77 year old male recently diagnosed with lung cancer. Staging.  EXAM: CT HEAD WITHOUT AND WITH CONTRAST  TECHNIQUE: Contiguous axial images were obtained from the base of the skull through the vertex without and with intravenous contrast  CONTRAST:  40m ISOVUE-300 IOPAMIDOL (ISOVUE-300) INJECTION 61%  COMPARISON:  PET-CT 10/10/2016  FINDINGS: Brain: Cerebral volume is within normal limits for age. No midline shift, ventriculomegaly, mass effect, evidence of mass lesion, intracranial hemorrhage or evidence of cortically based acute infarction. Gray-white matter differentiation is within normal limits throughout the brain. No abnormal enhancement identified.  Vascular: Mild Calcified atherosclerosis at the skull base. Major intracranial vascular structures are enhancing.  Skull: Bone mineralization is within normal limits. No osseous abnormality identified.  Sinuses/Orbits: Clear.  Other: Visualized orbit soft tissues are within normal limits. Scalp soft tissues are within normal limits.  IMPRESSION: No metastatic disease or acute intracranial abnormality. Normal for age CT appearance of the  brain.   Electronically Signed   By: Genevie Ann M.D.   On: 11/03/2016 09:06    Psychological-Psychiatric History: No reported psychological or psychiatric signs or symptoms such as difficulty sleeping, anxiety, depression, delusions or hallucinations (schizophrenial), mood swings (bipolar disorders) or suicidal ideations or attempts Gastrointestinal History: No reported gastrointestinal signs or symptoms such as vomiting or evacuating blood, reflux, heartburn, alternating episodes of diarrhea and constipation, inflamed or scarred liver, or pancreas or irrregular and/or infrequent bowel movements Genitourinary History: No reported renal or genitourinary signs or symptoms such as difficulty voiding or producing urine, peeing blood, non-functioning kidney, kidney stones, difficulty emptying the bladder, difficulty controlling the flow of urine, or chronic kidney disease Hematological History: No reported hematological signs or symptoms such as prolonged bleeding, low or poor functioning platelets, bruising or bleeding easily, hereditary bleeding problems, low energy levels due to low hemoglobin or being anemic Endocrine History: No reported endocrine signs or symptoms such as high or low blood sugar, rapid heart rate due to high thyroid levels, obesity or weight gain due to slow thyroid or thyroid disease Rheumatologic History: No reported rheumatological signs and symptoms such as fatigue, joint pain, tenderness, swelling, redness, heat, stiffness, decreased range of motion, with or without associated rash Musculoskeletal History: Negative for myasthenia gravis, muscular dystrophy, multiple sclerosis or malignant hyperthermia Work History: Retired  Allergies  Mr. Wolz has No Known Allergies.  Laboratory Chemistry  Inflammation Markers No results found for: CRP, ESRSEDRATE (CRP: Acute Phase) (ESR: Chronic Phase) Renal Function Markers Lab Results  Component Value Date   BUN 15 07/15/2017    CREATININE 1.40 (H) 07/15/2017   GFRAA 55 (L) 07/15/2017   GFRNONAA 48 (L) 07/15/2017   Hepatic Function Markers Lab Results  Component Value Date   AST 25 07/15/2017   ALT 29 07/15/2017   ALBUMIN 3.3 (L) 07/15/2017   ALKPHOS 94 07/15/2017   Electrolytes Lab Results  Component Value Date   NA 141 07/15/2017   K 3.9 07/15/2017   CL 106 07/15/2017   CALCIUM 9.6 07/15/2017   Neuropathy Markers No results found for: [id number] Bone Pathology Markers Lab Results  Component Value Date   ALKPHOS 94 07/15/2017   CALCIUM 9.6 07/15/2017   Coagulation Parameters Lab Results  Component Value Date   INR 1.26 11/07/2016   LABPROT 15.9 (H) 11/07/2016   APTT 32 11/07/2016   PLT 151 07/15/2017   Cardiovascular Markers Lab Results  Component Value Date   BNP 130.0 (H) 04/21/2017   HGB 12.6 (L) 07/15/2017   HCT 38.1 (L) 07/15/2017   Note: Lab results reviewed.  [place]  Drug: Mr. Worton  reports that he does not use drugs. Alcohol:  reports that he drinks about 1.2 oz of alcohol per week. Tobacco:  reports that he quit smoking about 13 years ago. he has never used smokeless tobacco. Medical:  has a past medical history of Cancer ([place]), CHF (congestive heart failure) ([place]), COPD (chronic obstructive pulmonary disease) ([place]), Coronary artery disease, Diabetes mellitus without complication ([place]), Fibromyalgia, GERD (gastroesophageal reflux disease), Hypertension, and Pulmonary emboli ([place]). Family: family history includes Diabetes in his unknown relative; Hypertension in his unknown relative; Prostate cancer in his brother and father.  Past Surgical  History:  Procedure Laterality Date  . COLONOSCOPY WITH PROPOFOL N/A 01/18/2015   Procedure: COLONOSCOPY WITH PROPOFOL;  Surgeon: Manya Silvas, MD;  Location[facility] ENDOSCOPY;  Service: Endoscopy;  Laterality: N/A;  . ESOPHAGOGASTRODUODENOSCOPY N/A 01/18/2015   Procedure: ESOPHAGOGASTRODUODENOSCOPY (EGD);  Surgeon: Manya Silvas, MD;   Location: [facility] END[facility]COPY;  Service: Endoscopy;  Laterality: N/A;  . EYE SURGERY    . FLEXIBLE BRONCHOSCOPY N/A 10/22/2016   Procedure: FLEXIBLE BRONCHOSCOPY;  Surgeon: Allyne Gee, MD;  Location: [facility] ORS;  Service: Pulmonary;  Laterality: N/A;  . HERNIA REPAIR    . IR FLUORO GUIDE PORT INSERTION LEFT  11/07/2016  . REPAIR KNEE LIGAMENT    . TONSILLECTOMY     Active Ambulatory Problems    Diagnosis Date Noted  . Sepsis ([place]) 08/04/2015  . CAP (community acquired pneumonia) 08/04/2015  . COPD (chronic obstructive pulmonary disease) ([place]) 08/04/2015  . Type 2 diabetes mellitus ([place]) 08/04/2015  . CAD (coronary artery disease) 08/04/2015  . HTN (hypertension) 08/04/2015  . GERD (gastroesophageal reflux disease) 08/04/2015  . HCAP (healthcare-associated pneumonia) 09/22/2016  . Lung cancer ([place]) 11/04/2016  . Goals of care, counseling/discussion 11/20/2016  . Bloating 12/04/2014  . Carpal tunnel syndrome 12/04/2016  . Gas 12/04/2014  . Hx of adenomatous polyp of colon 12/04/2014  . Sprain of wrist 12/04/2016  . Encounter for antineoplastic immunotherapy 02/27/2017  . Chronic throat pain (Primary Area of Pain) 08/12/2017  . Chronic secondary facial pain (Secondary Area of Pain) (R) 08/12/2017  . Chronic neck pain (Tertiary Area of Pain) 08/12/2017  . Chronic pain syndrome 08/12/2017  . Other long term (current) drug therapy 08/12/2017  . Disorder of bone, unspecified 08/12/2017  . Other specified health status 08/12/2017  . Long term current use of opiate analgesic 08/12/2017   Resolved Ambulatory Problems    Diagnosis Date Noted  . No Resolved Ambulatory Problems   Past Medical History:  Diagnosis Date  . Cancer ([place])   . CHF (congestive heart failure) ([place])   . COPD (chronic obstructive pulmonary disease) ([place])   . Coronary artery disease   . Diabetes mellitus without complication ([place])   . Fibromyalgia   . GERD (gastroesophageal reflux disease)   . Hypertension   .  Pulmonary emboli ([facility])    Constitutional Exam  General appearance: alert and cooperative Frustrated with the need for additional testing Vitals:   08/12/17 1311  BP: 102/90  Pulse: 94  Resp: 16  Temp: 98.2 F (36.8 C)  TempSrc: Oral  SpO2: 93%  Weight: 194 lb (88 kg)  Height: _0  (1.803 m)   BMI Assessment: Estimated body mass index is 27.06 kg/m as calculated from the following:   Height as of this encounter: _1  (1.803 m).   Weight as of this encounter: 194 lb (88 kg).  BMI interpretation table: BMI level Category Range association with higher incidence of chronic pain  <18 kg/m2 Underweight   18.5-24.9 kg/m2 Ideal body weight   25-29.9 kg/m2 Overweight Increased incidence by 20%  30-34.9 kg/m2 Obese (Class I) Increased incidence by 68%  35-39.9 kg/m2 Severe obesity (Class II) Increased incidence by 136%  >40 kg/m2 Extreme obesity (Class III) Increased incidence by 254%   BMI Readings from Last 4 Encounters:  08/12/17 27.06 kg/m  07/27/17 27.20 kg/m  07/16/17 27.59 kg/m  06/16/17 27.48 kg/m   Wt Readings from Last 4 Encounters:  08/12/17 194 lb (88 kg)  07/27/17 195 lb (88.5 kg)  07/16/17 197 lb 12.8 oz (  89.7 kg)  06/16/17 197 lb (89.4 kg)  Psych/Mental status: Alert, oriented x 3 (person, place, & time)        ENT: mucous membrane pale moist and intact, dentures visible Eyes: PERLA Respiratory: No evidence of acute respiratory distress Neuro: CN 2-12 intact  Cervical Spine Exam  Inspection: No masses, redness, or swelling Alignment: Symmetrical Functional ROM: Unrestricted ROM      Stability: No instability detected Muscle strength & Tone: Functionally intact Sensory: Unimpaired Palpation: No palpable anomalies              Upper Extremity (UE) Exam    Side: Right upper extremity  Side: Left upper extremity  Inspection: No masses, redness, swelling, or asymmetry. No contractures  Inspection: No masses, redness, swelling, or asymmetry. No  contractures  Functional ROM: Unrestricted ROM          Functional ROM: Unrestricted ROM          Muscle strength & Tone: Functionally intact  Muscle strength & Tone: Functionally intact  Sensory: Unimpaired  Sensory: Unimpaired  Palpation: No palpable anomalies              Palpation: No palpable anomalies              Specialized Test(s): Deferred         Specialized Test(s): Deferred          Gait & Posture Assessment  Ambulation: Unassisted Gait: Relatively normal for age and body habitus Posture: WNL    Assessment  Primary Diagnosis & Pertinent Problem List: Diagnoses of Chronic throat pain, Chronic secondary facial pain (Secondary Area of Pain)(R), Chronic neck pain (Tertiary Area of Pain), Chronic pain syndrome, Other long term (current) drug therapy, Disorder of bone, unspecified, Other specified health status, and Long term current use of opiate analgesic were pertinent to this visit.  Visit Diagnosis: 1. Chronic throat pain   2. Chronic secondary facial pain (Secondary Area of Pain)(R)   3. Chronic neck pain (Tertiary Area of Pain)   4. Chronic pain syndrome   5. Other long term (current) drug therapy   6. Disorder of bone, unspecified   7. Other specified health status   8. Long term current use of opiate analgesic    Plan of Care  Initial treatment plan:  Please be advised that as per protocol, today's visit has been an evaluation only. We have not taken over the patient's controlled substance management.  Problem-specific plan: No problem-specific Assessment & Plan notes found for this encounter.  Ordered Lab-work, Procedure(s), Referral(s), & Consult(s): Orders Placed This Encounter  Procedures  . DG Cervical Spine With Flex & Extend  . Compliance Drug Analysis, Ur  . Magnesium  . Vitamin B12  . Comp. Metabolic Panel (12)  . Sedimentation rate  . 25-Hydroxyvitamin D Lcms D2+D3  . C-reactive protein  . Ambulatory referral to Psychology    Pharmacotherapy: Medications ordered:  No orders of the defined types were placed in this encounter.  Medications administered during this visit: Zyshonne L. Beegle had no medications administered during this visit.   Pharmacotherapy under consideration:  Opioid Analgesics: The patient was informed that there is no guarantee that he would be a candidate for opioid analgesics. The decision will be made following CDC guidelines. This decision will be based on the results of diagnostic studies, as well as Mr. Shaw's risk profile.  Membrane stabilizer: To be determined at a later time Muscle relaxant: To be determined at a later time NSAID:  To be determined at a later time Other analgesic(s): To be determined at a later time   Interventional therapies under consideration: Mr. Czajka was informed that there is no guarantee that he would be a candidate for interventional therapies. The decision will be based on the results of diagnostic studies, as well as Mr. Servantes's risk profile.  Possible procedure(s): Diagnostic CESI Diagnostic cervical facet nerve block  Possible cervical facet RFA   Provider-requested follow-up: Return for 2nd Visit, w/ Dr. Dossie Arbour, after MedPsych eval.  Future Appointments  Date Time Provider [place]  09/03/2017 10:30 AM Devona Konig A, MD [facility] None  10/09/2017  2:00 PM [facility] None  10/12/2017 10:00 AM [facility]  10/15/2017  2:30 PM Sindy Guadeloupe, MD [facility] None  12/01/2017  1:00 PM [facility] None  12/31/2017 11:00 AM Noreene Filbert, MD [facility] None    Primary Care Physician: Danelle Berry, NP Location: [facility] Outpatient Pain Management Facility Note by:  Date: 08/12/2017; Time: 3:28 PM  Pain Score Disclaimer: We use the NRS-11 scale. This is a self-reported, subjective measurement of pain severity with only modest accuracy. It is used primarily to identify changes within a particular patient.  It must be understood that outpatient pain scales are significantly less accurate that those used for research, where they can be applied under ideal controlled circumstances with minimal exposure to variables. In reality, the score is likely to be a combination of pain intensity and pain affect, where pain affect describes the degree of emotional arousal or changes in action readiness caused by the sensory experience of pain. Factors such as social and work situation, setting, emotional state, anxiety levels, expectation, and prior pain experience may influence pain perception and show large inter-individual differences that may also be affected by time variables.  Patient instructions provided during this appointment: Patient Instructions   _ ____________________________________________________________________________________________  Appointment Policy Summary  It is our goal and responsibility to provide the medical community with assistance in the evaluation and management of patients with chronic pain. Unfortunately our resources are limited. Because we do not have an unlimited amount of time, or available appointments, we are required to closely monitor and manage their use. The following rules exist to maximize their use:  Patient's responsibilities: 1. Punctuality:  At what time should I arrive? You should be physically present in our office 30 minutes before your scheduled appointment. Your scheduled appointment is with your assigned healthcare provider. However, it takes 5-10 minutes to be "checked-in", and another 15 minutes for the nurses to do the admission. If you arrive to our office at the time you were given for your appointment, you will end up being at least 20-25 minutes late to your appointment with the provider. 2. Tardiness:  What happens if I arrive only a few minutes after my scheduled appointment time? You will need to reschedule your appointment. The cutoff is your  appointment time. This is why it is so important that you arrive at least 30 minutes before that appointment. If you have an appointment scheduled for 10:00 AM and you arrive at 10:01, you will be required to reschedule your appointment.  3. Plan ahead:  Always assume that you will encounter traffic on your way in. Plan for it. If you are dependent on a driver, make sure they understand these rules and the need to arrive early. 4. Other appointments and responsibilities:  Avoid scheduling any other appointments before or after your pain clinic appointments.  5. Be prepared:  Write down everything that you need to discuss with your healthcare provider and give this information to the admitting nurse. Write down the medications that you will need refilled. Bring your pills and bottles (even the empty ones), to all of your appointments, except for those where a procedure is scheduled. 6. No children or pets:  Find someone to take care of them. It is not appropriate to bring them in. 7. Scheduling changes:  We request "advanced notification" of any changes or cancellations. 8. Advanced notification:  Defined as a time period of more than 24 hours prior to the originally scheduled appointment. This allows for the appointment to be offered to other patients. 9. Rescheduling:  When a visit is rescheduled, it will require the cancellation of the original appointment. For this reason they both fall within the category of "Cancellations".  10. Cancellations:  They require advanced notification. Any cancellation less than 24 hours before the  appointment will be recorded as a "No Show". 11. No Show:  Defined as an unkept appointment where the patient failed to notify or declare to the practice their intention or inability to keep the appointment.  Corrective process for repeat offenders:  1. Tardiness: Three (3) episodes of rescheduling due to late arrivals will be recorded as one (1) "No  Show". 2. Cancellation or reschedule: Three (3) cancellations or rescheduling will be recorded as one (1) "No Show". 3. "No Shows": Three (3) "No Shows" within a 12 month period will result in discharge from the practice.  ____________________________________________________________________________________________   ___________________________________________________________________________________________  Pain Scale  Introduction: The pain score used by this practice is the Verbal Numerical Rating Scale (VNRS-11). This is an 11-point scale. It is for adults and children 10 years or older. There are significant differences in how the pain score is reported, used, and applied. Forget everything you learned in the past and learn this scoring system.  General Information: The scale should reflect your current level of pain. Unless you are specifically asked for the level of your worst pain, or your average pain. If you are asked for one of these two, then it should be understood that it is over the past 24 hours.  Basic Activities of Daily Living (ADL): Personal hygiene, dressing, eating, transferring, and using restroom.  Instructions: Most patients tend to report their level of pain as a combination of two factors, their physical pain and their psychosocial pain. This last one is also known as "suffering" and it is reflection of how physical pain affects you socially and psychologically. From now on, report them separately. From this point on, when asked to report your pain level, report only your physical pain. Use the following table for reference.  Pain Clinic Pain Levels (0-5/10)  Pain Level Score  Description  No Pain 0   Mild pain 1 Nagging, annoying, but does not interfere with basic activities of daily living (ADL). Patients are able to eat, bathe, get dressed, toileting (being able to get on and off the toilet and perform personal hygiene functions), transfer (move in and out of bed or  a chair without assistance), and maintain continence (able to control bladder and bowel functions). Blood pressure and heart rate are unaffected. A normal heart rate for a healthy adult ranges from 60 to 100 bpm (beats per minute).   Mild to moderate pain 2 Noticeable and distracting. Impossible to hide from other people. More frequent flare-ups. Still possible to adapt and function close to normal. It  can be very annoying and may have occasional stronger flare-ups. With discipline, patients may get used to it and adapt.   Moderate pain 3 Interferes significantly with activities of daily living (ADL). It becomes difficult to feed, bathe, get dressed, get on and off the toilet or to perform personal hygiene functions. Difficult to get in and out of bed or a chair without assistance. Very distracting. With effort, it can be ignored when deeply involved in activities.   Moderately severe pain 4 Impossible to ignore for more than a few minutes. With effort, patients may still be able to manage work or participate in some social activities. Very difficult to concentrate. Signs of autonomic nervous system discharge are evident: dilated pupils (mydriasis); mild sweating (diaphoresis); sleep interference. Heart rate becomes elevated (>115 bpm). Diastolic blood pressure (lower number) rises above 100 mmHg. Patients find relief in laying down and not moving.   Severe pain 5 Intense and extremely unpleasant. Associated with frowning face and frequent crying. Pain overwhelms the senses.  Ability to do any activity or maintain social relationships becomes significantly limited. Conversation becomes difficult. Pacing back and forth is common, as getting into a comfortable position is nearly impossible. Pain wakes you up from deep sleep. Physical signs will be obvious: pupillary dilation; increased sweating; goosebumps; brisk reflexes; cold, clammy hands and feet; nausea, vomiting or dry heaves; loss of appetite;  significant sleep disturbance with inability to fall asleep or to remain asleep. When persistent, significant weight loss is observed due to the complete loss of appetite and sleep deprivation.  Blood pressure and heart rate becomes significantly elevated. Caution: If elevated blood pressure triggers a pounding headache associated with blurred vision, then the patient should immediately seek attention at an urgent or emergency care unit, as these may be signs of an impending stroke.    Emergency Department Pain Levels (6-10/10)  Emergency Room Pain 6 Severely limiting. Requires emergency care and should not be seen or managed at an outpatient pain management facility. Communication becomes difficult and requires great effort. Assistance to reach the emergency department may be required. Facial flushing and profuse sweating along with potentially dangerous increases in heart rate and blood pressure will be evident.   Distressing pain 7 Self-care is very difficult. Assistance is required to transport, or use restroom. Assistance to reach the emergency department will be required. Tasks requiring coordination, such as bathing and getting dressed become very difficult.   Disabling pain 8 Self-care is no longer possible. At this level, pain is disabling. The individual is unable to do even the most "basic" activities such as walking, eating, bathing, dressing, transferring to a bed, or toileting. Fine motor skills are lost. It is difficult to think clearly.   Incapacitating pain 9 Pain becomes incapacitating. Thought processing is no longer possible. Difficult to remember your own name. Control of movement and coordination are lost.   The worst pain imaginable 10 At this level, most patients pass out from pain. When this level is reached, collapse of the autonomic nervous system occurs, leading to a sudden drop in blood pressure and heart rate. This in turn results in a temporary and dramatic drop in blood  flow to the brain, leading to a loss of consciousness. Fainting is one of the body's self defense mechanisms. Passing out puts the brain in a calmed state and causes it to shut down for a while, in order to begin the healing process.    Summary: 1. Refer to this scale when providing  [place] with your pain level. 2. Be accurate and careful when reporting your pain level. This will help with your care. 3. Over-reporting your pain level will lead to loss of credibility. 4. Even a level of 1/10 means that there is pain and will be treated at our facility. 5. High, inaccurate reporting will be documented as "Symptom Exaggeration", leading to loss of credibility and suspicions of possible secondary gains such as obtaining more narcotics, or wanting to appear disabled, for fraudulent reasons. 6. Only pain levels of 5 or below will be seen at our facility. 7. Pain levels of 6 and above will be sent to the Emergency Department and the appointment cancelled. ____________________________________________________________________________________________

## 2017-08-12 NOTE — Patient Instructions (Signed)

## 2017-08-16 LAB — SEDIMENTATION RATE: SED RATE: 53 mm/h — AB (ref 0–30)

## 2017-08-16 LAB — COMP. METABOLIC PANEL (12)
ALK PHOS: 116 IU/L (ref 39–117)
AST: 20 IU/L (ref 0–40)
Albumin/Globulin Ratio: 0.9 — ABNORMAL LOW (ref 1.2–2.2)
Albumin: 3.7 g/dL (ref 3.5–4.8)
BUN/Creatinine Ratio: 10 (ref 10–24)
BUN: 18 mg/dL (ref 8–27)
Bilirubin Total: 0.3 mg/dL (ref 0.0–1.2)
CREATININE: 1.79 mg/dL — AB (ref 0.76–1.27)
Calcium: 9.9 mg/dL (ref 8.6–10.2)
Chloride: 107 mmol/L — ABNORMAL HIGH (ref 96–106)
GFR calc Af Amer: 42 mL/min/{1.73_m2} — ABNORMAL LOW (ref >59)
GFR calc non Af Amer: 36 mL/min/{1.73_m2} — ABNORMAL LOW (ref >59)
Globulin, Total: 4 g/dL (ref 1.5–4.5)
Glucose: 108 mg/dL — ABNORMAL HIGH (ref 65–99)
Potassium: 4.7 mmol/L (ref 3.5–5.2)
Sodium: 143 mmol/L (ref 134–144)
Total Protein: 7.7 g/dL (ref 6.0–8.5)

## 2017-08-16 LAB — 25-HYDROXYVITAMIN D LCMS D2+D3: 25-HYDROXY, VITAMIN D: 8 ng/mL — AB

## 2017-08-16 LAB — 25-HYDROXY VITAMIN D LCMS D2+D3: 25-Hydroxy, Vitamin D-3: 7.9 ng/mL

## 2017-08-16 LAB — MAGNESIUM: MAGNESIUM: 1.8 mg/dL (ref 1.6–2.3)

## 2017-08-16 LAB — VITAMIN B12: VITAMIN B 12: 401 pg/mL (ref 232–1245)

## 2017-08-16 LAB — C-REACTIVE PROTEIN: CRP: 8.1 mg/L — AB (ref 0.0–4.9)

## 2017-08-18 ENCOUNTER — Telehealth: Payer: Self-pay | Admitting: Internal Medicine

## 2017-08-18 LAB — COMPLIANCE DRUG ANALYSIS, UR

## 2017-08-18 NOTE — Telephone Encounter (Signed)
SIGNED CMN FOR OXYGEN PUT INTO [facility] BOX TO BE PICKED UP.JW

## 2017-08-31 ENCOUNTER — Encounter: Payer: Self-pay | Admitting: Internal Medicine

## 2017-09-02 ENCOUNTER — Other Ambulatory Visit: Payer: Self-pay

## 2017-09-02 ENCOUNTER — Emergency Department
Admission: EM | Admit: 2017-09-02 | Discharge: 2017-09-02 | Disposition: A | Discharge status: Home / Self Care | Payer: Medicare Other | Attending: Emergency Medicine | Admitting: Emergency Medicine

## 2017-09-02 ENCOUNTER — Emergency Department: Payer: Medicare Other

## 2017-09-02 ENCOUNTER — Encounter: Payer: Self-pay | Admitting: Emergency Medicine

## 2017-09-02 DIAGNOSIS — Z79899 Other long term (current) drug therapy: Secondary | ICD-10-CM | POA: Insufficient documentation

## 2017-09-02 DIAGNOSIS — H1032 Unspecified acute conjunctivitis, left eye: Secondary | ICD-10-CM | POA: Diagnosis not present

## 2017-09-02 DIAGNOSIS — J441 Chronic obstructive pulmonary disease with (acute) exacerbation: Secondary | ICD-10-CM | POA: Diagnosis not present

## 2017-09-02 DIAGNOSIS — E119 Type 2 diabetes mellitus without complications: Secondary | ICD-10-CM | POA: Diagnosis not present

## 2017-09-02 DIAGNOSIS — I2510 Atherosclerotic heart disease of native coronary artery without angina pectoris: Secondary | ICD-10-CM | POA: Insufficient documentation

## 2017-09-02 DIAGNOSIS — Z87891 Personal history of nicotine dependence: Secondary | ICD-10-CM | POA: Diagnosis not present

## 2017-09-02 DIAGNOSIS — Z85118 Personal history of other malignant neoplasm of bronchus and lung: Secondary | ICD-10-CM | POA: Diagnosis not present

## 2017-09-02 DIAGNOSIS — Z7901 Long term (current) use of anticoagulants: Secondary | ICD-10-CM | POA: Diagnosis not present

## 2017-09-02 DIAGNOSIS — I110 Hypertensive heart disease with heart failure: Secondary | ICD-10-CM | POA: Diagnosis not present

## 2017-09-02 DIAGNOSIS — Z7984 Long term (current) use of oral hypoglycemic drugs: Secondary | ICD-10-CM | POA: Diagnosis not present

## 2017-09-02 DIAGNOSIS — I509 Heart failure, unspecified: Secondary | ICD-10-CM | POA: Insufficient documentation

## 2017-09-02 DIAGNOSIS — R0602 Shortness of breath: Secondary | ICD-10-CM | POA: Diagnosis present

## 2017-09-02 LAB — BASIC METABOLIC PANEL
ANION GAP: 7 (ref 5–15)
BUN: 15 mg/dL (ref 6–20)
CO2: 26 mmol/L (ref 22–32)
Calcium: 9.6 mg/dL (ref 8.9–10.3)
Chloride: 108 mmol/L (ref 101–111)
Creatinine, Ser: 1.55 mg/dL — ABNORMAL HIGH (ref 0.61–1.24)
GFR calc Af Amer: 49 mL/min — ABNORMAL LOW (ref >60)
GFR, EST NON AFRICAN AMERICAN: 42 mL/min — AB (ref >60)
GLUCOSE: 140 mg/dL — AB (ref 65–99)
POTASSIUM: 3.9 mmol/L (ref 3.5–5.1)
Sodium: 141 mmol/L (ref 135–145)

## 2017-09-02 LAB — CBC
HEMATOCRIT: 41.1 % (ref 40.0–52.0)
HEMOGLOBIN: 13.4 g/dL (ref 13.0–18.0)
MCH: 29.4 pg (ref 26.0–34.0)
MCHC: 32.7 g/dL (ref 32.0–36.0)
MCV: 90.1 fL (ref 80.0–100.0)
Platelets: 151 10*3/uL (ref 150–440)
RBC: 4.57 MIL/uL (ref 4.40–5.90)
RDW: 16.4 % — ABNORMAL HIGH (ref 11.5–14.5)
WBC: 5.5 10*3/uL (ref 3.8–10.6)

## 2017-09-02 LAB — TROPONIN I: Troponin I: <0.03 ng/mL (ref <0.03)

## 2017-09-02 MED ORDER — METHYLPREDNISOLONE SODIUM SUCC 125 MG IJ SOLR
125.0000 mg | Freq: Once | INTRAMUSCULAR | Status: AC
  Administered 2017-09-02: 125 mg via INTRAVENOUS
  Filled 2017-09-02: qty 2

## 2017-09-02 MED ORDER — IPRATROPIUM-ALBUTEROL 0.5-2.5 (3) MG/3ML IN SOLN
3.0000 mL | Freq: Once | RESPIRATORY_TRACT | Status: AC
  Administered 2017-09-02: 3 mL via RESPIRATORY_TRACT

## 2017-09-02 MED ORDER — POLYMYXIN B-TRIMETHOPRIM 10000-0.1 UNIT/ML-% OP SOLN: 1.0000 [drp] | OPHTHALMIC | 0 refills | Status: DC

## 2017-09-02 MED ORDER — IOPAMIDOL (ISOVUE-370) INJECTION 76%
75.0000 mL | Freq: Once | INTRAVENOUS | Status: AC | PRN
  Administered 2017-09-02: 75 mL via INTRAVENOUS

## 2017-09-02 MED ORDER — IPRATROPIUM-ALBUTEROL 0.5-2.5 (3) MG/3ML IN SOLN
3.0000 mL | Freq: Once | RESPIRATORY_TRACT | Status: AC
Start: 2017-09-02 — End: 2017-09-02
  Administered 2017-09-02: 3 mL via RESPIRATORY_TRACT
  Filled 2017-09-02: qty 6

## 2017-09-02 MED ORDER — PREDNISONE 50 MG PO TABS: 50.0000 mg | ORAL_TABLET | Freq: Every day | ORAL | 0 refills | Status: AC

## 2017-09-02 MED ORDER — AZITHROMYCIN 250 MG PO TABS: ORAL_TABLET | ORAL | 0 refills | Status: DC

## 2017-09-02 NOTE — ED Triage Notes (Signed)
Pt in with co cough and congestion for few weeks but started with shob today. Pt is on home 02 hx of copd and lung cancer.

## 2017-09-02 NOTE — Discharge Instructions (Signed)
Please take your antibiotics and steroids as prescribed and follow-up with your primary care physician in 2 days for reexamination.  Return to the emergency department sooner for any concerns.  It was a pleasure to take care of you today, and thank you for coming to our emergency department.  If you have any questions or concerns before leaving please ask the nurse to grab me and I'm more than happy to go through your aftercare instructions again.  If you were prescribed any opioid pain medication today such as Norco, Vicodin, Percocet, morphine, hydrocodone, or oxycodone please make sure you do not drive when you are taking this medication as it can alter your ability to drive safely.  If you have any concerns once you are home that you are not improving or are in fact getting worse before you can make it to your follow-up appointment, please do not hesitate to call 911 and come back for further evaluation.  Darel Hong, MD  Results for orders placed or performed during the hospital encounter of 17/79/39  Basic metabolic panel  Result Value Ref Range   Sodium 141 135 - 145 mmol/L   Potassium 3.9 3.5 - 5.1 mmol/L   Chloride 108 101 - 111 mmol/L   CO2 26 22 - 32 mmol/L   Glucose, Bld 140 (H) 65 - 99 mg/dL   BUN 15 6 - 20 mg/dL   Creatinine, Ser 1.55 (H) 0.61 - 1.24 mg/dL   Calcium 9.6 8.9 - 10.3 mg/dL   GFR calc non Af Amer 42 (L) >60 mL/min   GFR calc Af Amer 49 (L) >60 mL/min   Anion gap 7 5 - 15  CBC  Result Value Ref Range   WBC 5.5 3.8 - 10.6 K/uL   RBC 4.57 4.40 - 5.90 MIL/uL   Hemoglobin 13.4 13.0 - 18.0 g/dL   HCT 41.1 40.0 - 52.0 %   MCV 90.1 80.0 - 100.0 fL   MCH 29.4 26.0 - 34.0 pg   MCHC 32.7 32.0 - 36.0 g/dL   RDW 16.4 (H) 11.5 - 14.5 %   Platelets 151 150 - 440 K/uL  Troponin I  Result Value Ref Range   Troponin I <0.03 <0.03 ng/mL   Dg Chest 2 View  Result Date: 09/02/2017 CLINICAL DATA:  Shortness of breath EXAM: CHEST  2 VIEW COMPARISON:  CT chest  07/15/2017, radiograph 04/21/2017 FINDINGS: Left-sided central venous port tip overlies the distal SVC. Hyperinflation with severe emphysematous disease and large lucent bulla in the left chest as before. Right parahilar distortion and interstitial disease is similar compared to prior. Scarring at the left lung base medially. There are small pleural effusions. 16 mm right suprahilar lung nodule IMPRESSION: 1. Post treatment changes on the right with severe emphysematous disease and areas of scarring, unchanged 2. Small pleural effusions 3. 16 mm right upper lung pulmonary nodule, could represent enlargement of a nodule noted on the most recent CT from January 2019 for which close imaging follow-up was recommended. Electronically Signed   By: Donavan Foil M.D.   On: 09/02/2017 02:19   Ct Angio Chest Pe W/cm &/or Wo Cm  Result Date: 09/02/2017 CLINICAL DATA:  Short of breath, history of lung cancer EXAM: CT ANGIOGRAPHY CHEST WITH CONTRAST TECHNIQUE: Multidetector CT imaging of the chest was performed using the standard protocol during bolus administration of intravenous contrast. Multiplanar CT image reconstructions and MIPs were obtained to evaluate the vascular anatomy. CONTRAST:  47mL ISOVUE-370 IOPAMIDOL (ISOVUE-370) INJECTION 76% COMPARISON:  Radiograph 09/02/2017, CT 07/15/2017, 04/21/2017, 03/10/2017, 12/31/2016, PET-CT 10/10/2016 FINDINGS: Cardiovascular: Satisfactory opacification of the pulmonary arteries to the segmental level. No evidence of pulmonary embolism. Ectasia of the ascending aorta, measuring up to 4 cm. No dissection. Moderate aortic atherosclerosis. Coronary artery calcification. Borderline heart size. No significant pericardial effusion Mediastinum/Nodes: Midline trachea. No thyroid mass. No enlarging mediastinal lymph nodes. Esophagus within normal limits. Lungs/Pleura: Small moderate right pleural effusion which is loculated superiorly. Severe emphysema with large bulla in the left  lung. Scarring in the left parahilar region. Architectural distortion, fibrosis and density in the right perihilar region consistent with post treatment changes. Residual soft tissue density in the region of the prior mass is stable and measures 3 by 2.6 cm. Interval increase in size of a right upper lobe lung nodule now measuring 13 mm. Upper Abdomen: Large cyst arising from the upper pole of the left kidney. Calcified granuloma in the liver. Musculoskeletal: Degenerative changes of the spine. Review of the MIP images confirms the above findings. IMPRESSION: 1. Negative for acute pulmonary embolus. 2. Small to moderate right pleural effusion with loculation superiorly. 3. Severe emphysema with fibrosis, distortion and post treatment changes in the right lung. Pulmonary nodule in the right upper lobe appears slightly increased in size, now measuring 13 mm, compared with 11 mm previously. Further evaluation with PET-CT is suggested. 4. 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 Electronically Signed   By: Donavan Foil M.D.   On: 09/02/2017 03:06

## 2017-09-02 NOTE — ED Provider Notes (Addendum)
[facility] Emergency Department Provider Note  ____________________________________________   First MD Initiated Contact with Patient 09/02/17 [phone]     (approximate)  I have reviewed the triage vital signs and the nursing notes.   HISTORY  Chief Complaint Shortness of Breath   HPI Chris Wilkinson is a 77 y.o. male who comes to the emergency department with roughly 1 day of gradual onset slowly progressive now moderate to severe shortness of breath.  He has a past medical history of lung cancer as well as COPD.  He is normally on 2 L of oxygen at home.  He has a cough that is productive of small amounts of sputum.  He denies fevers or chills.  His symptoms are improved with rest and somewhat worsened with exertion.  He also reports several days of discharge to his left eye.  Past Medical History:  Diagnosis Date  . Cancer ([place])    Lung CA  . CHF (congestive heart failure) ([place])   . COPD (chronic obstructive pulmonary disease) ([place])   . Coronary artery disease   . Diabetes mellitus without complication ([place])   . Fibromyalgia   . GERD (gastroesophageal reflux disease)   . Hypertension   . Pulmonary emboli [facility])     Patient Active Problem List   Diagnosis Date Noted  . Chronic throat pain (Primary Area of Pain) 08/12/2017  . Chronic secondary facial pain (Secondary Area of Pain) (R) 08/12/2017  . Chronic neck pain (Tertiary Area of Pain) 08/12/2017  . Chronic pain syndrome 08/12/2017  . Other long term (current) drug therapy 08/12/2017  . Disorder of bone, unspecified 08/12/2017  . Other specified health status 08/12/2017  . Long term current use of opiate analgesic 08/12/2017  . Encounter for antineoplastic immunotherapy 02/27/2017  . Carpal tunnel syndrome 12/04/2016  . Sprain of wrist 12/04/2016  . Goals of care, counseling/discussion 11/20/2016  . Lung cancer ([place]) 11/04/2016  . HCAP (healthcare-associated pneumonia) 09/22/2016  . Sepsis  ([place]) 08/04/2015  . CAP (community acquired pneumonia) 08/04/2015  . COPD (chronic obstructive pulmonary disease) ([place]) 08/04/2015  . Type 2 diabetes mellitus ([place]) 08/04/2015  . CAD (coronary artery disease) 08/04/2015  . HTN (hypertension) 08/04/2015  . GERD (gastroesophageal reflux disease) 08/04/2015  . Bloating 12/04/2014  . Gas 12/04/2014  . Hx of adenomatous polyp of colon 12/04/2014    Past Surgical History:  Procedure Laterality Date  . COLONOSCOPY WITH PROPOFOL N/A 01/18/2015   Procedure: COLONOSCOPY WITH PROPOFOL;  Surgeon: Manya Silvas, MD;  Location: [facility] ENDOSCOPY;  Service: Endoscopy;  Laterality: N/A;  . ESOPHAGOGASTRODUODENOSCOPY N/A 01/18/2015   Procedure: ESOPHAGOGASTRODUODENOSCOPY (EGD);  Surgeon: Manya Silvas, MD;  Location: [facility] ENDOSCOPY;  Service: Endoscopy;  Laterality: N/A;  . EYE SURGERY    . FLEXIBLE BRONCHOSCOPY N/A 10/22/2016   Procedure: FLEXIBLE BRONCHOSCOPY;  Surgeon: Allyne Gee, MD;  Location: [facility] ORS;  Service: Pulmonary;  Laterality: N/A;  . HERNIA REPAIR    . IR FLUORO GUIDE PORT INSERTION LEFT  11/07/2016  . REPAIR KNEE LIGAMENT    . TONSILLECTOMY      Prior to Admission medications   Medication Sig Start Date End Date Taking? Authorizing Provider  acetaminophen (TYLENOL) 325 MG tablet Take 2 tablets (650 mg total) by mouth every 6 (six) hours as needed for mild pain (or Fever >/= 101). 09/26/16  Yes Gouru, Illene Silver, MD  amiodarone (PACERONE) 200 MG tablet Take 200 mg by mouth daily.   Yes [provider]  apixaban (  ELIQUIS) 5 MG TABS tablet Take 1 tablet (5 mg total) by mouth 2 (two) times daily. 04/10/17  Yes Verlon Au, NP  Fluticasone Furoate-Vilanterol 100-25 MCG/INH AEPB Inhale 1 puff into the lungs daily.    Yes [provider]  furosemide (LASIX) 20 MG tablet Take 20 mg daily by mouth.   Yes [provider]  metFORMIN (GLUCOPHAGE) 500 MG tablet Take 500 mg by mouth 2 (two) times daily.    Yes [provider]  metoprolol tartrate (LOPRESSOR) 25 MG tablet Take 25 mg by mouth 2 (two) times daily.   Yes [provider]  simvastatin (ZOCOR) 10 MG tablet Take 10 mg by mouth every evening.    Yes [provider]  azithromycin (ZITHROMAX Z-PAK) 250 MG tablet Take 2 tablets (500 mg) on  Day 1,  followed by 1 tablet (250 mg) once daily on Days 2 through 5. 09/02/17   Darel Hong, MD  predniSONE (DELTASONE) 50 MG tablet Take 1 tablet (50 mg total) by mouth daily for 4 days. 09/02/17 09/06/17  Darel Hong, MD  trimethoprim-polymyxin b (POLYTRIM) ophthalmic solution Place 1 drop into the left eye every 4 (four) hours. 09/02/17   Darel Hong, MD  prochlorperazine (COMPAZINE) 10 MG tablet Take 1 tablet (10 mg total) by mouth every 6 (six) hours as needed (Nausea or vomiting). Patient not taking: Reported on 12/04/2016 11/04/16 02/03/17  Sindy Guadeloupe, MD    Allergies Patient has no known allergies.  Family History  Problem Relation Age of Onset  . Prostate cancer Father   . Prostate cancer Brother   . Diabetes Unknown        all 13 siblings  . Hypertension Unknown        all 13 siblings    Social History Social History   Tobacco Use  . Smoking status: Former Smoker    Last attempt to quit: 12/01/2003    Years since quitting: 13.7  . Smokeless tobacco: Never Used  Substance Use Topics  . Alcohol use: Yes    Alcohol/week: 1.2 oz    Types: 2 Shots of liquor per week    Comment: on the weeknd 2 shots  . Drug use: No    Review of Systems Constitutional: No fever/chills Eyes: No visual changes. ENT: No sore throat. Cardiovascular: Positive for chest pain. Respiratory: Positive for shortness of breath. Gastrointestinal: No abdominal pain.  No nausea, no vomiting.  No diarrhea.  No constipation. Genitourinary: Negative for dysuria. Musculoskeletal: Negative for back pain. Skin: Negative for rash. Neurological: Negative for headaches, focal weakness or  numbness.   ____________________________________________   PHYSICAL EXAM:  VITAL SIGNS: ED Triage Vitals  Enc Vitals Group     BP 09/02/17 0122 (!) 142/93     Pulse Rate 09/02/17 0122 95     Resp 09/02/17 0122 18     Temp 09/02/17 0122 97.9 F (36.6 C)     Temp Source 09/02/17 0122 Oral     SpO2 09/02/17 0122 99 %     Weight 09/02/17 0123 195 lb (88.5 kg)     Height 09/02/17 0123 _0  (1.803 m)     Head Circumference --      Peak Flow --      Pain Score 09/02/17 0122 0     Pain Loc --      Pain Edu? --      Excl. in [place]? --     Constitutional: Alert and oriented x4 mild respiratory  distress speaks in short sentences Eyes: PERRL EOMI. no injection on the left eye but does have some thick green discharge on his lids Head: Atraumatic. Nose: No congestion/rhinnorhea. Mouth/Throat: No trismus Neck: No stridor.   Cardiovascular: Normal rate, regular rhythm. Grossly normal heart sounds.  Good peripheral circulation. Respiratory: Slightly decreased respiratory rate largely clear lung sounds on the left somewhat wheezy on the right Gastrointestinal: Soft nontender Musculoskeletal: No lower extremity edema legs are equal in size Neurologic:  Normal speech and language. No gross focal neurologic deficits are appreciated. Skin:  Skin is warm, dry and intact. No rash noted. Psychiatric: Mood and affect are normal. Speech and behavior are normal.    ____________________________________________   DIFFERENTIAL includes but not limited to  Pulmonary embolism, acute coronary syndrome, empyema, COPD exacerbation, pneumonia, pneumothorax ____________________________________________   LABS (all labs ordered are listed, but only abnormal results are displayed)  Labs Reviewed  BASIC METABOLIC PANEL - Abnormal; Notable for the following components:      Result Value   Glucose, Bld 140 (*)    Creatinine, Ser 1.55 (*)    GFR calc non Af Amer 42 (*)    GFR calc Af Amer 49 (*)     All other components within normal limits  CBC - Abnormal; Notable for the following components:   RDW 16.4 (*)    All other components within normal limits  TROPONIN I    Lab work reviewed by me with slightly elevated creatinine at the patient's baseline otherwise unremarkable labs __________________________________________  EKG  ED ECG REPORT I, Darel Hong, the attending physician, personally viewed and interpreted this ECG.  Date: 09/02/2017 EKG Time:  Rate: 95 Rhythm: normal sinus rhythm QRS Axis: Leftward axis Intervals: normal ST/T Wave abnormalities: normal Narrative Interpretation: no evidence of acute ischemia  ____________________________________________  RADIOLOGY  Chest x-ray reviewed by me with no evidence of pneumonia or pneumothorax CT angiogram of the chest reviewed by me with no evidence of pulmonary embolism ____________________________________________   PROCEDURES  Procedure(s) performed: no  Procedures  Critical Care performed: no  Observation: no ____________________________________________   INITIAL IMPRESSION / ASSESSMENT AND PLAN / ED COURSE  Pertinent labs & imaging results that were available during my care of the patient were reviewed by me and considered in my medical decision making (see chart for details).  The patient arrives in mild respiratory distress.  His lung is slightly wheezy on the right although largely clear on the left raising concern for a vascular and not a pulmonary process.  2 breathing treatments are pending however he does require a CT angiogram of the chest.  The patient is quiet lungs on the left are likely caused by his very large blebs.  Fortunately his CT is negative for blood clot.  He feels improved after multiple breathing treatments and saturating well on 2 L.  Will discharge home with steroids and azithromycin.  Strict return precautions have been given and the patient is wife verbalized understanding  and agreement with the plan.      ____________________________________________   FINAL CLINICAL IMPRESSION(S) / ED DIAGNOSES  Final diagnoses:  COPD exacerbation ([place])  Acute conjunctivitis of left eye, unspecified acute conjunctivitis type      NEW MEDICATIONS STARTED DURING THIS VISIT:  Discharge Medication List as of 09/02/2017  3:55 AM    START taking these medications   Details  azithromycin (ZITHROMAX Z-PAK) 250 MG tablet Take 2 tablets (500 mg) on  Day 1,  followed by 1 tablet (  250 mg) once daily on Days 2 through 5., Print    predniSONE (DELTASONE) 50 MG tablet Take 1 tablet (50 mg total) by mouth daily for 4 days., Starting Wed 09/02/2017, Until Sun 09/06/2017, Print    trimethoprim-polymyxin b (POLYTRIM) ophthalmic solution Place 1 drop into the left eye every 4 (four) hours., Starting Wed 09/02/2017, Print         Note:  This document was prepared using Dragon voice recognition software and may include unintentional dictation errors.     Darel Hong, MD 09/03/17 [id number]    Darel Hong, MD 09/03/17 [phone]

## 2017-09-03 ENCOUNTER — Ambulatory Visit: Payer: Self-pay | Admitting: Internal Medicine

## 2017-09-03 ENCOUNTER — Telehealth: Payer: Self-pay | Admitting: Internal Medicine

## 2017-09-03 NOTE — Telephone Encounter (Signed)
SIGNED ORDER FOR ALBUTEROL AND SUPPLIES FOR NEBULIZER SUPPLIES FAXED TO [facility] AND PUT INTO [email or web address]

## 2017-09-07 ENCOUNTER — Observation Stay
Admission: EM | Admit: 2017-09-07 | Discharge: 2017-09-08 | Disposition: A | Discharge status: Home / Self Care | Payer: Medicare Other | Attending: Internal Medicine | Admitting: Internal Medicine

## 2017-09-07 ENCOUNTER — Emergency Department: Payer: Medicare Other

## 2017-09-07 ENCOUNTER — Other Ambulatory Visit: Payer: Self-pay

## 2017-09-07 ENCOUNTER — Encounter: Payer: Self-pay | Admitting: Emergency Medicine

## 2017-09-07 DIAGNOSIS — K219 Gastro-esophageal reflux disease without esophagitis: Secondary | ICD-10-CM | POA: Diagnosis not present

## 2017-09-07 DIAGNOSIS — J939 Pneumothorax, unspecified: Secondary | ICD-10-CM | POA: Diagnosis present

## 2017-09-07 DIAGNOSIS — Z79899 Other long term (current) drug therapy: Secondary | ICD-10-CM | POA: Diagnosis not present

## 2017-09-07 DIAGNOSIS — Z86711 Personal history of pulmonary embolism: Secondary | ICD-10-CM | POA: Insufficient documentation

## 2017-09-07 DIAGNOSIS — I2510 Atherosclerotic heart disease of native coronary artery without angina pectoris: Secondary | ICD-10-CM | POA: Diagnosis not present

## 2017-09-07 DIAGNOSIS — J90 Pleural effusion, not elsewhere classified: Secondary | ICD-10-CM | POA: Insufficient documentation

## 2017-09-07 DIAGNOSIS — J9311 Primary spontaneous pneumothorax: Principal | ICD-10-CM | POA: Insufficient documentation

## 2017-09-07 DIAGNOSIS — G894 Chronic pain syndrome: Secondary | ICD-10-CM | POA: Diagnosis not present

## 2017-09-07 DIAGNOSIS — I4891 Unspecified atrial fibrillation: Secondary | ICD-10-CM | POA: Insufficient documentation

## 2017-09-07 DIAGNOSIS — I509 Heart failure, unspecified: Secondary | ICD-10-CM | POA: Diagnosis not present

## 2017-09-07 DIAGNOSIS — Z87891 Personal history of nicotine dependence: Secondary | ICD-10-CM | POA: Diagnosis not present

## 2017-09-07 DIAGNOSIS — I313 Pericardial effusion (noninflammatory): Secondary | ICD-10-CM | POA: Diagnosis not present

## 2017-09-07 DIAGNOSIS — M797 Fibromyalgia: Secondary | ICD-10-CM | POA: Insufficient documentation

## 2017-09-07 DIAGNOSIS — J430 Unilateral pulmonary emphysema [MacLeod's syndrome]: Secondary | ICD-10-CM | POA: Diagnosis not present

## 2017-09-07 DIAGNOSIS — Z7984 Long term (current) use of oral hypoglycemic drugs: Secondary | ICD-10-CM | POA: Insufficient documentation

## 2017-09-07 DIAGNOSIS — E876 Hypokalemia: Secondary | ICD-10-CM | POA: Insufficient documentation

## 2017-09-07 DIAGNOSIS — I110 Hypertensive heart disease with heart failure: Secondary | ICD-10-CM | POA: Insufficient documentation

## 2017-09-07 DIAGNOSIS — E119 Type 2 diabetes mellitus without complications: Secondary | ICD-10-CM | POA: Diagnosis not present

## 2017-09-07 DIAGNOSIS — Z85118 Personal history of other malignant neoplasm of bronchus and lung: Secondary | ICD-10-CM | POA: Insufficient documentation

## 2017-09-07 DIAGNOSIS — I700 Atherosclerosis of aorta: Secondary | ICD-10-CM | POA: Insufficient documentation

## 2017-09-07 LAB — COMPREHENSIVE METABOLIC PANEL
ALK PHOS: 100 U/L (ref 38–126)
ALT: 53 U/L (ref 17–63)
AST: 27 U/L (ref 15–41)
Albumin: 3.3 g/dL — ABNORMAL LOW (ref 3.5–5.0)
Anion gap: 7 (ref 5–15)
BILIRUBIN TOTAL: 0.5 mg/dL (ref 0.3–1.2)
BUN: 25 mg/dL — AB (ref 6–20)
CALCIUM: 9.4 mg/dL (ref 8.9–10.3)
CO2: 24 mmol/L (ref 22–32)
CREATININE: 1.62 mg/dL — AB (ref 0.61–1.24)
Chloride: 105 mmol/L (ref 101–111)
GFR calc Af Amer: 46 mL/min — ABNORMAL LOW (ref >60)
GFR calc non Af Amer: 40 mL/min — ABNORMAL LOW (ref >60)
GLUCOSE: 134 mg/dL — AB (ref 65–99)
Potassium: 3.2 mmol/L — ABNORMAL LOW (ref 3.5–5.1)
Sodium: 136 mmol/L (ref 135–145)
TOTAL PROTEIN: 7.9 g/dL (ref 6.5–8.1)

## 2017-09-07 LAB — CBC
HEMATOCRIT: 37.9 % — AB (ref 40.0–52.0)
Hemoglobin: 12.3 g/dL — ABNORMAL LOW (ref 13.0–18.0)
MCH: 29.3 pg (ref 26.0–34.0)
MCHC: 32.4 g/dL (ref 32.0–36.0)
MCV: 90.3 fL (ref 80.0–100.0)
Platelets: 138 10*3/uL — ABNORMAL LOW (ref 150–440)
RBC: 4.2 MIL/uL — AB (ref 4.40–5.90)
RDW: 16.6 % — ABNORMAL HIGH (ref 11.5–14.5)
WBC: 6.7 10*3/uL (ref 3.8–10.6)

## 2017-09-07 LAB — TROPONIN I
Troponin I: <0.03 ng/mL (ref <0.03)
Troponin I: <0.03 ng/mL (ref <0.03)

## 2017-09-07 MED ORDER — APIXABAN 5 MG PO TABS
5.0000 mg | ORAL_TABLET | Freq: Two times a day (BID) | ORAL | Status: DC
  Administered 2017-09-07 – 2017-09-08 (×2): 5 mg via ORAL
  Filled 2017-09-07 (×2): qty 1

## 2017-09-07 MED ORDER — PRIMIDONE 50 MG PO TABS
50.0000 mg | ORAL_TABLET | Freq: Every day | ORAL | Status: DC
  Administered 2017-09-08: 50 mg via ORAL
  Filled 2017-09-07: qty 1

## 2017-09-07 MED ORDER — DOCUSATE SODIUM 100 MG PO CAPS: 100.0000 mg | ORAL_CAPSULE | Freq: Two times a day (BID) | ORAL | Status: DC | PRN

## 2017-09-07 MED ORDER — METOPROLOL SUCCINATE ER 50 MG PO TB24
50.0000 mg | ORAL_TABLET | Freq: Every day | ORAL | Status: DC
  Administered 2017-09-08: 50 mg via ORAL
  Filled 2017-09-07: qty 1

## 2017-09-07 MED ORDER — VITAMIN D (ERGOCALCIFEROL) 1.25 MG (50000 UNIT) PO CAPS: 50000.0000 [IU] | ORAL_CAPSULE | ORAL | Status: DC

## 2017-09-07 MED ORDER — AMIODARONE HCL 200 MG PO TABS
200.0000 mg | ORAL_TABLET | Freq: Every day | ORAL | Status: DC
  Administered 2017-09-08: 200 mg via ORAL
  Filled 2017-09-07: qty 1

## 2017-09-07 MED ORDER — INSULIN ASPART 100 UNIT/ML ~~LOC~~ SOLN
0.0000 [IU] | Freq: Three times a day (TID) | SUBCUTANEOUS | Status: DC
  Administered 2017-09-08 (×2): 2 [IU] via SUBCUTANEOUS
  Filled 2017-09-07 (×2): qty 1

## 2017-09-07 MED ORDER — IPRATROPIUM-ALBUTEROL 0.5-2.5 (3) MG/3ML IN SOLN
3.0000 mL | RESPIRATORY_TRACT | Status: DC
  Administered 2017-09-07 – 2017-09-08 (×5): 3 mL via RESPIRATORY_TRACT
  Filled 2017-09-07 (×5): qty 3

## 2017-09-07 MED ORDER — POLYMYXIN B-TRIMETHOPRIM 10000-0.1 UNIT/ML-% OP SOLN
1.0000 [drp] | OPHTHALMIC | Status: DC
  Administered 2017-09-07 – 2017-09-08 (×4): 1 [drp] via OPHTHALMIC
  Filled 2017-09-07: qty 10

## 2017-09-07 MED ORDER — FUROSEMIDE 20 MG PO TABS
20.0000 mg | ORAL_TABLET | Freq: Every day | ORAL | Status: DC
  Administered 2017-09-08: 20 mg via ORAL
  Filled 2017-09-07: qty 1

## 2017-09-07 MED ORDER — POTASSIUM CHLORIDE CRYS ER 20 MEQ PO TBCR
40.0000 meq | EXTENDED_RELEASE_TABLET | Freq: Once | ORAL | Status: AC
  Administered 2017-09-07: 40 meq via ORAL
  Filled 2017-09-07: qty 2

## 2017-09-07 MED ORDER — LINAGLIPTIN 5 MG PO TABS
5.0000 mg | ORAL_TABLET | Freq: Every day | ORAL | Status: DC
  Administered 2017-09-08: 5 mg via ORAL
  Filled 2017-09-07: qty 1

## 2017-09-07 MED ORDER — ACETAMINOPHEN 325 MG PO TABS
650.0000 mg | ORAL_TABLET | Freq: Four times a day (QID) | ORAL | Status: DC | PRN
  Administered 2017-09-08: 650 mg via ORAL
  Filled 2017-09-07: qty 2

## 2017-09-07 MED ORDER — SIMVASTATIN 20 MG PO TABS
10.0000 mg | ORAL_TABLET | Freq: Every evening | ORAL | Status: DC
  Administered 2017-09-07: 10 mg via ORAL
  Filled 2017-09-07: qty 1

## 2017-09-07 MED ORDER — METFORMIN HCL 500 MG PO TABS
500.0000 mg | ORAL_TABLET | Freq: Two times a day (BID) | ORAL | Status: DC
Start: 2017-09-08 — End: 2017-09-08
  Administered 2017-09-08: 500 mg via ORAL
  Filled 2017-09-07: qty 1

## 2017-09-07 MED ORDER — IOPAMIDOL (ISOVUE-370) INJECTION 76%
75.0000 mL | Freq: Once | INTRAVENOUS | Status: AC | PRN
  Administered 2017-09-07: 75 mL via INTRAVENOUS

## 2017-09-07 NOTE — ED Provider Notes (Addendum)
[facility] Emergency Department Provider Note  ____________________________________________  Time seen: Approximately 12:20 PM  I have reviewed the triage vital signs and the nursing notes.   HISTORY  Chief Complaint Chest Pain    HPI Chris Wilkinson is a 77 y.o. male with a history of lung CA status post radiation and chemotherapy, COPD, CHF, CAD, PE on Eliquis, presenting with back pain.  The patient reports that for the past 3 days he has had a right flank pain has radiated upwards to the center of the back and is now in between the shoulder blades.  This pain is worse in the morning and last for several seconds and then "eases off."  In the evening times he is often pain-free.  The pain is only present with deep breaths, not positional changes.  He has not had any trauma or new exercise regimen or strain.  He denies any chest pain or shortness of breath.  No fevers or chills.  No change in his cough.  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  Past Medical History:  Diagnosis Date  . Cancer ([place])    Lung CA  . CHF (congestive heart failure) ([place])   . COPD (chronic obstructive pulmonary disease) ([place])   . Coronary artery disease   . Diabetes mellitus without complication ([place])   . Fibromyalgia   . GERD (gastroesophageal reflux disease)   . Hypertension   . Pulmonary emboli [facility])     Patient Active Problem List   Diagnosis Date Noted  . Chronic throat pain (Primary Area of Pain) 08/12/2017  . Chronic secondary facial pain (Secondary Area of Pain) (R) 08/12/2017  . Chronic neck pain (Tertiary Area of Pain) 08/12/2017  . Chronic pain syndrome 08/12/2017  . Other long term (current) drug therapy 08/12/2017  . Disorder of bone, unspecified 08/12/2017  . Other specified health status 08/12/2017  . Long term current  use of opiate analgesic 08/12/2017  . Encounter for antineoplastic immunotherapy 02/27/2017  . Carpal tunnel syndrome 12/04/2016  . Sprain of wrist 12/04/2016  . Goals of care, counseling/discussion 11/20/2016  . Lung cancer ([place]) 11/04/2016  . HCAP (healthcare-associated pneumonia) 09/22/2016  . Sepsis ([place]) 08/04/2015  . CAP (community acquired pneumonia) 08/04/2015  . COPD (chronic obstructive pulmonary disease) ([place]) 08/04/2015  . Type 2 diabetes mellitus ([place]) 08/04/2015  . CAD (coronary artery disease) 08/04/2015  . HTN (hypertension) 08/04/2015  . GERD (gastroesophageal reflux disease) 08/04/2015  . Bloating 12/04/2014  . Gas 12/04/2014  . Hx of adenomatous polyp of colon 12/04/2014    Past Surgical History:  Procedure Laterality Date  . COLONOSCOPY WITH PROPOFOL N/A 01/18/2015   Procedure: COLONOSCOPY WITH PROPOFOL;  Surgeon: Manya Silvas, MD;  [facility] ENDOSCOPY;  Service: Endoscopy;  Laterality: N/A;  . ESOPHAGOGASTRODUODENOSCOPY N/A 01/18/2015   Procedure: ESOPHAGOGASTRODUODENOSCOPY (EGD);  Surgeon: Manya Silvas, MD;  Location: [facility] ENDOSCOPY;  Service: Endoscopy;  Laterality: N/A;  . EYE SURGERY    . FLEXIBLE BRONCHOSCOPY N/A 10/22/2016   Procedure: FLEXIBLE BRONCHOSCOPY;  Surgeon: Allyne Gee, MD;  Location: [facility] ORS;  Service: Pulmonary;  Laterality: N/A;  . HERNIA REPAIR    . IR FLUORO GUIDE PORT INSERTION LEFT  11/07/2016  . REPAIR KNEE LIGAMENT    . TONSILLECTOMY      Current Outpatient Rx  . Order #: [id number] Class: Historical Med  .  Order #: [id number] Class: Normal  . Order #: [id number] Class: Historical Med  . Order #: [id number] Class: Historical Med  . Order #: [id number] Class: Historical Med  . Order #: [id number] Class: Historical Med  . Order #: [id number] Class: Historical Med  . Order #: [id number] Class: Historical Med  . Order #: [id number] Class: OTC  . Order #: [id number] Class: Print  . Order #: [id number] Class: Print  . Order #: [id number] Class:  Historical Med    Allergies Patient has no known allergies.  Family History  Problem Relation Age of Onset  . Prostate cancer Father   . Prostate cancer Brother   . Diabetes Unknown        all 13 siblings  . Hypertension Unknown        all 13 siblings    Social History Social History   Tobacco Use  . Smoking status: Former Smoker    Last attempt to quit: 12/01/2003    Years since quitting: 13.7  . Smokeless tobacco: Never Used  Substance Use Topics  . Alcohol use: Yes    Alcohol/week: 1.2 oz    Types: 2 Shots of liquor per week    Comment: on the weeknd 2 shots  . Drug use: No    Review of Systems Constitutional: No fever/chills.  No lightheadedness or syncope. Eyes: No visual changes. ENT: No sore throat. No congestion or rhinorrhea. Cardiovascular: Denies chest pain. Denies palpitations. Respiratory: Denies shortness of breath.  No cough. Gastrointestinal: No abdominal pain.  No nausea, no vomiting.  No diarrhea.  No constipation. Genitourinary: Negative for dysuria. Musculoskeletal: Positive for right flank pain which radiates to the center of the back between the shoulder blades. Skin: Negative for rash. Neurological: Negative for headaches. No focal numbness, tingling or weakness.     ____________________________________________   PHYSICAL EXAM:  VITAL SIGNS: ED Triage Vitals [09/07/17 0947]  Enc Vitals Group     BP (!) 144/77     Pulse Rate 95     Resp 20     Temp 98.7 F (37.1 C)     Temp Source Oral     SpO2 94 %     Weight 200 lb (90.7 kg)     Height _0  (1.803 m)     Head Circumference      Peak Flow      Pain Score 3     Pain Loc      Pain Edu?      Excl. in [place]?     Constitutional: Alert and oriented.  Chronically ill appearing and in no acute distress. Answers questions appropriately. Eyes: Conjunctivae are normal.  EOMI. No scleral icterus. Head: Atraumatic. Nose: No congestion/rhinnorhea. Mouth/Throat: Mucous membranes are  moist.  Neck: No stridor.  Supple.  No JVD.  No midline C-spine tenderness to palpation, step-offs or deformities. Cardiovascular: Normal rate, regular rhythm. No murmurs, rubs or gallops.  Respiratory: Normal respiratory effort.  The patient has rales in the bases on the right.  No wheezes or rhonchi.  Speaks in full sentences without difficulty.  O2 sats are in the mid 90s on room air.   Gastrointestinal: Soft, nontender and nondistended.  No guarding or rebound.  No peritoneal signs. Musculoskeletal: No LE edema. No ttp in the calves or palpable cords.  Negative Homan's sign.  No midline thoracic or lumbar spine tenderness to palpation, step-offs or deformities.  No skin changes on the back. Neurologic:  A&Ox3.  Speech is clear.  Face and smile are symmetric.  EOMI.  Moves all  extremities well. Skin:  Skin is warm, dry and intact. No rash noted. Psychiatric: Mood and affect are normal. Speech and behavior are normal.  Normal judgement.  ____________________________________________   LABS (all labs ordered are listed, but only abnormal results are displayed)  Labs Reviewed  CBC - Abnormal; Notable for the following components:      Result Value   RBC 4.20 (*)    Hemoglobin 12.3 (*)    HCT 37.9 (*)    RDW 16.6 (*)    Platelets 138 (*)    All other components within normal limits  COMPREHENSIVE METABOLIC PANEL - Abnormal; Notable for the following components:   Potassium 3.2 (*)    Glucose, Bld 134 (*)    BUN 25 (*)    Creatinine, Ser 1.62 (*)    Albumin 3.3 (*)    GFR calc non Af Amer 40 (*)    GFR calc Af Amer 46 (*)    All other components within normal limits  TROPONIN I  TROPONIN I   ____________________________________________  EKG  ED ECG REPORT I, Eula Listen, the attending physician, personally viewed and interpreted this ECG.   Date: 09/07/2017  EKG Time: 944  Rate: 93  Rhythm: normal sinus rhythm  Axis: leftward  Intervals:none  ST&T Change: No  STEMI; T wave inversion in V4, V5 and V6 that is nonspecific  ____________________________________________  RADIOLOGY  Dg Chest 2 View  Result Date: 09/07/2017 CLINICAL DATA:  77 year old male with chest pain onset this morning. History of lung cancer. EXAM: CHEST  2 VIEW COMPARISON:  Chest CTA 09/02/2017 and earlier. FINDINGS: Stable left chest Port-A-Cath. 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 A small right pleural effusion persists and is stable. The right suprahilar lung nodule described on 09/02/2017 persists (arrow). No new pulmonary opacity. Stable cardiac size and mediastinal contours. No acute osseous abnormality identified. Negative visible bowel gas pattern. IMPRESSION: Severe chronic lung disease. Stable chest since the radiographs and CTA on 09/02/2017, including small right pleural effusion and right upper lung nodule - please also see that CTA report. Electronically Signed   By: Genevie Ann M.D.   On: 09/07/2017 10:23   Ct Angio Chest Pe W And/or Wo Contrast  Result Date: 09/07/2017 CLINICAL DATA:  Chest pain EXAM: CT ANGIOGRAPHY CHEST WITH CONTRAST TECHNIQUE: Multidetector CT imaging of the chest was performed using the standard protocol during bolus administration of intravenous contrast. Multiplanar CT image reconstructions and MIPs were obtained to evaluate the vascular anatomy. CONTRAST:  40m ISOVUE-370 IOPAMIDOL (ISOVUE-370) INJECTION 76% COMPARISON:  CT angiogram chest September 02, 2017; chest radiograph September 07, 2017 the FINDINGS: Cardiovascular: There is no demonstrable pulmonary embolus. Ascending thoracic aortic diameter measures 4.0 x 4.0 cm. There is no evident thoracic aortic dissection. There is mild calcification in the proximal left subclavian artery. There are foci of calcification in the thoracic aorta. There are multiple foci of coronary artery calcification. There is a minimal  pericardial effusion. The pericardium does not appear appreciably thickened. Port-A-Cath tip is in the superior vena cava. The main pulmonary outflow tract measures 3.0 cm in diameter, a finding concerning for a degree of pulmonary arterial hypertension. Mediastinum/Nodes: Thyroid appears unremarkable. No adenopathy is evident. No esophageal lesions are appreciable. 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 Fibrosis in the right perihilar region persists. There is a moderate pleural effusion  on the right, stable. There is no edema or consolidation. The previously noted nodular opacity in the posterior segment of the right upper lobe measures images 1.3 x 1.1 cm, stable. There is a new right apical and apicolateral pneumothorax without tension component. An area of pneumothorax is also noted in the medial right base. Upper Abdomen: In the visualized upper abdomen, there is atherosclerotic calcification in the aorta. There is a cyst arising from the left kidney posteriorly measuring 7.2 x 6.8 cm. Visualized upper abdominal structures otherwise appear unremarkable. Musculoskeletal: There is degenerative change throughout the thoracic spine. There are no blastic or lytic bone lesions. Review of the MIP images confirms the above findings. IMPRESSION: 1. Apicolateral and basilar pneumothorax on the right, new. No tension component on the right. 2.  No evident pulmonary embolus. 3. Extensive underlying bullous emphysematous change with large bulla in the left upper lobe region causing traction. 4. Post radiation therapy change right perihilar region. The previously noted nodular lesion in the posterior segment right upper lobe measuring 1.3 x 1.1 cm is again noted. As noted on recent study, this lesion is concerning for focal neoplasm and may well warrant PET-CT to further evaluate. 5.  No evident adenopathy. 6. Ascending thoracic aortic  diameter measures 4.0 x 4.0 cm. There is aortic atherosclerosis and coronary artery calcification. Recommend annual imaging followup by CTA or MRA. This recommendation follows 2010 ACCF/AHA/AATS/ACR/ASA/SCA/SCAI/SIR/STS/SVM Guidelines for the Diagnosis and Management of Patients with Thoracic Aortic Disease. Circulation. 2010; 121: C166-A630. 7.  Small pericardial effusion. 8. Mild prominence of the main pulmonary outflow tract, concerning for pulmonary arterial hypertension. Aortic Atherosclerosis (ICD10-I70.0) and Emphysema (ICD10-J43.9). Critical Value/emergent results were called by telephone at the time of interpretation on 09/07/2017 at 1:39 pm to Dr. Eula Listen , who verbally acknowledged these results. Electronically Signed   By: Lowella Grip III M.D.   On: 09/07/2017 13:40    ____________________________________________   PROCEDURES  Procedure(s) performed: None  Procedures  Critical Care performed: No ____________________________________________   INITIAL IMPRESSION / ASSESSMENT AND PLAN / ED COURSE  Pertinent labs & imaging results that were available during my care of the patient were reviewed by me and considered in my medical decision making (see chart for details).  77 y.o. male with a history of lung cancer, PE, pleural effusion, COPD, and a sending aortic aneurysm presenting with pain between the shoulder blades.  Overall, the patient has a blood pressure 144/77 but otherwise has reassuring vital signs with normal oxygenation.  There are multiple possible causes for the patient's symptoms including something superficial like musculoskeletal pain.  However, he is a very high risk patient will get a CT angiogram to evaluate for PE, or aortic pathology.  His EKG does not show any ischemic changes and his troponin is negative.  The remainder of his labs are also reassuring including a chronic unchanged anemia.  Plan reevaluation for final disposition.  The patient has  deferred any medication for his pain.  ----------------------------------------- 3:55 PM on 09/07/2017 -----------------------------------------  The patient's clinical course was reassuring, with continued hemodynamic stability and normal oxygenation.  His CT scan did not show any evidence of PE, but he does have a 20-30% pneumothorax in the right lung.  This is complicated by significant scarring, with complicated anatomy.  After speaking directly with the radiologist, it is indicated to use interventional radiology for chest tube placement as needed.  I have spoken with Dr. Fatima Sanger, the interventional radiologist on call.  Both of [place] agree  that conservative monitoring is indicated at this time.  The patient is hemodynamically stable, he is not hypoxic, and his symptoms are minimal.    My plan was to admit the patient to the hospitalist for continued monitoring, with IR aware if the patient's clinical course worsened.  When I let the patient and his wife know, both of them were adamant about being discharged for outpatient follow-up.  I had a long discussion with him about the morbidity and mortality, especially if the pneumothorax worsened.  After some time, the patient's wife left in the patient has now agreed to stay at the hospital for monitoring.  I spoken to the hospitalist for admission. ____________________________________________  FINAL CLINICAL IMPRESSION(S) / ED DIAGNOSES  Final diagnoses:  Primary spontaneous pneumothorax  Hypokalemia         NEW MEDICATIONS STARTED DURING THIS VISIT:  New Prescriptions   No medications on file      Eula Listen, MD 09/07/17 1558    Eula Listen, MD 09/07/17 [phone]

## 2017-09-07 NOTE — ED Triage Notes (Signed)
Chest pain x 3 days. States originally hurt worse with inspiration but does not effect it now.

## 2017-09-07 NOTE — ED Notes (Signed)
Patient transported to CT 

## 2017-09-07 NOTE — ED Notes (Signed)
Pt is back from CT at this time.

## 2017-09-07 NOTE — H&P (Signed)
Chris Wilkinson at [place] NAME: Chris Wilkinson    MR#:  [id number]  DATE OF BIRTH:  July 15, 1940  DATE OF ADMISSION:  09/07/2017  PRIMARY CARE PHYSICIAN: Danelle Berry, NP   REQUESTING/REFERRING PHYSICIAN: Mariea Clonts  CHIEF COMPLAINT:   Chief Complaint  Patient presents with  . Chest Pain    HISTORY OF PRESENT ILLNESS: Chris Wilkinson  is a 77 y.o. male with a known history of lung cancer status post radiation and chemotherapy, CHF, COPD, coronary artery disease, diabetes, gastric surgery for disease, hypertension, pulmonary emboli- was having some chest pain both side lower ribs and in the back so came to emergency room, on workup he was noted to have right-sided pneumothorax which is small. Patient denies any associated palpitation, any fever, excessive cough or sputum production. He denies worsening of shortness of breath or does not have any hypoxia. ER physician spoke to on call radiologist, and they suggested to just monitor in the hospital.  PAST MEDICAL HISTORY:   Past Medical History:  Diagnosis Date  . Cancer ([place])    Lung CA  . CHF (congestive heart failure) ([place])   . COPD (chronic obstructive pulmonary disease) ([place])   . Coronary artery disease   . Diabetes mellitus without complication ([place])   . Fibromyalgia   . GERD (gastroesophageal reflux disease)   . Hypertension   . Pulmonary emboli ([place])     PAST SURGICAL HISTORY:  Past Surgical History:  Procedure Laterality Date  . COLONOSCOPY WITH PROPOFOL N/A 01/18/2015   Procedure: COLONOSCOPY WITH PROPOFOL;  Surgeon: Manya Silvas, MD;  Location: [facility] ENDOSCOPY;  Service: Endoscopy;  Laterality: N/A;  . ESOPHAGOGASTRODUODENOSCOPY N/A 01/18/2015   Procedure: ESOPHAGOGASTRODUODENOSCOPY (EGD);  Surgeon: Manya Silvas, MD;  Location: [facility]COPY;  Service: Endoscopy;  Laterality: N/A;  . EYE SURGERY    . FLEXIBLE BRONCHOSCOPY N/A 10/22/2016   Procedure: FLEXIBLE BRONCHOSCOPY;   Surgeon: Allyne Gee, MD;  Location: [facility] ORS;  Service: Pulmonary;  Laterality: N/A;  . HERNIA REPAIR    . IR FLUORO GUIDE PORT INSERTION LEFT  11/07/2016  . REPAIR KNEE LIGAMENT    . TONSILLECTOMY      SOCIAL HISTORY:  Social History   Tobacco Use  . Smoking status: Former Smoker    Last attempt to quit: 12/01/2003    Years since quitting: 13.7  . Smokeless tobacco: Never Used  Substance Use Topics  . Alcohol use: Yes    Alcohol/week: 1.2 oz    Types: 2 Shots of liquor per week    Comment: on the weeknd 2 shots    FAMILY HISTORY:  Family History  Problem Relation Age of Onset  . Prostate cancer Father   . Prostate cancer Brother   . Diabetes Unknown        all 13 siblings  . Hypertension Unknown        all 13 siblings    DRUG ALLERGIES: No Known Allergies  REVIEW OF SYSTEMS:   CONSTITUTIONAL: No fever, fatigue or weakness.  EYES: No blurred or double vision.  EARS, NOSE, AND THROAT: No tinnitus or ear pain.  RESPIRATORY: No cough, shortness of breath, wheezing or hemoptysis.  CARDIOVASCULAR: No chest pain, orthopnea, edema.  GASTROINTESTINAL: No nausea, vomiting, diarrhea or abdominal pain.  GENITOURINARY: No dysuria, hematuria.  ENDOCRINE: No polyuria, nocturia,  HEMATOLOGY: No anemia, easy bruising or bleeding SKIN: No rash or lesion. MUSCULOSKELETAL: No joint pain or arthritis.   NEUROLOGIC: No tingling, numbness,  weakness.  PSYCHIATRY: No anxiety or depression.   MEDICATIONS AT HOME:  Prior to Admission medications   Medication Sig Start Date End Date Taking? Authorizing Provider  amiodarone (PACERONE) 200 MG tablet Take 200 mg by mouth daily.   Yes [provider]  apixaban (ELIQUIS) 5 MG TABS tablet Take 1 tablet (5 mg total) by mouth 2 (two) times daily. 04/10/17  Yes Verlon Au, NP  furosemide (LASIX) 20 MG tablet Take 20 mg daily by mouth.   Yes [provider]  metFORMIN (GLUCOPHAGE) 500 MG tablet Take 500 mg by mouth 2 (two)  times daily.    Yes [provider]  metoprolol succinate (TOPROL-XL) 50 MG 24 hr tablet Take 50 mg by mouth daily. Take with or immediately following a meal.   Yes [provider]  primidone (MYSOLINE) 50 MG tablet Take 1 tablet by mouth daily. 08/18/17  Yes [provider]  simvastatin (ZOCOR) 10 MG tablet Take 10 mg by mouth every evening.    Yes [provider]  TRADJENTA 5 MG TABS tablet Take 5 mg by mouth daily. 08/25/17  Yes [provider]  acetaminophen (TYLENOL) 325 MG tablet Take 2 tablets (650 mg total) by mouth every 6 (six) hours as needed for mild pain (or Fever >/= 101). 09/26/16   Gouru, Illene Silver, MD  azithromycin (ZITHROMAX Z-PAK) 250 MG tablet Take 2 tablets (500 mg) on  Day 1,  followed by 1 tablet (250 mg) once daily on Days 2 through 5. Patient not taking: Reported on 09/07/2017 09/02/17   Darel Hong, MD  trimethoprim-polymyxin b (POLYTRIM) ophthalmic solution Place 1 drop into the left eye every 4 (four) hours. Patient not taking: Reported on 09/07/2017 09/02/17   Darel Hong, MD  Vitamin D, Ergocalciferol, (DRISDOL) 50000 units CAPS capsule Take 1 capsule by mouth every Thursday. 08/25/17   [provider]  prochlorperazine (COMPAZINE) 10 MG tablet Take 1 tablet (10 mg total) by mouth every 6 (six) hours as needed (Nausea or vomiting). Patient not taking: Reported on 12/04/2016 11/04/16 02/03/17  Sindy Guadeloupe, MD      PHYSICAL EXAMINATION:   VITAL SIGNS: Blood pressure (!) 155/89, pulse 71, temperature 98.7 F (37.1 C), temperature source Oral, resp. rate 18, height _0  (1.803 m), weight 90.7 kg (200 lb), SpO2 96 %.  GENERAL:  77 y.o.-year-old patient lying in the bed with no acute distress.  EYES: Pupils equal, round, reactive to light and accommodation. No scleral icterus. Extraocular muscles intact.  HEENT: Head atraumatic, normocephalic. Oropharynx and nasopharynx clear.  NECK:  Supple, no jugular venous  distention. No thyroid enlargement, no tenderness.  LUNGS: Normal breath sounds bilaterally, no wheezing, rales,rhonchi or crepitation. No use of accessory muscles of respiration.  CARDIOVASCULAR: S1, S2 normal. No murmurs, rubs, or gallops.  ABDOMEN: Soft, nontender, nondistended. Bowel sounds present. No organomegaly or mass.  EXTREMITIES: No pedal edema, cyanosis, or clubbing.  NEUROLOGIC: Cranial nerves II through XII are intact. Muscle strength 5/5 in all extremities. Sensation intact. Gait not checked.  PSYCHIATRIC: The patient is alert and oriented x 3.  SKIN: No obvious rash, lesion, or ulcer.   LABORATORY PANEL:   CBC Recent Labs  Lab 09/02/17 0143 09/07/17 0949  WBC 5.5 6.7  HGB 13.4 12.3*  HCT 41.1 37.9*  PLT 151 138*  MCV 90.1 90.3  MCH 29.4 29.3  MCHC 32.7 32.4  RDW 16.4* 16.6*   ------------------------------------------------------------------------------------------------------------------  Chemistries  Recent Labs  Lab 09/02/17 0143 09/07/17 0949  NA 141 136  K 3.9 3.2*  CL 108 105  CO2 26 24  GLUCOSE 140* 134*  BUN 15 25*  CREATININE 1.55* 1.62*  CALCIUM 9.6 9.4  AST  --  27  ALT  --  53  ALKPHOS  --  100  BILITOT  --  0.5   ------------------------------------------------------------------------------------------------------------------ estimated creatinine clearance is 45.4 mL/min (A) (by C-G formula based on SCr of 1.62 mg/dL (H)). ------------------------------------------------------------------------------------------------------------------ No results for input(s): TSH, T4TOTAL, T3FREE, THYROIDAB in the last 72 hours.  Invalid input(s): FREET3   Coagulation profile No results for input(s): INR, PROTIME in the last 168 hours. ------------------------------------------------------------------------------------------------------------------- No results for input(s): DDIMER in the last 72  hours. -------------------------------------------------------------------------------------------------------------------  Cardiac Enzymes Recent Labs  Lab 09/02/17 0143 09/07/17 0949 09/07/17 1257  TROPONINI <0.03 <0.03 <0.03   ------------------------------------------------------------------------------------------------------------------ Invalid input(s): POCBNP  ---------------------------------------------------------------------------------------------------------------  Urinalysis    Component Value Date/Time   COLORURINE STRAW (A) 05/26/2017 1304   APPEARANCEUR CLEAR (A) 05/26/2017 1304   LABSPEC 1.027 05/26/2017 1304   PHURINE 5.0 05/26/2017 1304   GLUCOSEU >=500 (A) 05/26/2017 1304   HGBUR NEGATIVE 05/26/2017 1304   BILIRUBINUR NEGATIVE 05/26/2017 1304   KETONESUR NEGATIVE 05/26/2017 1304   PROTEINUR NEGATIVE 05/26/2017 1304   NITRITE NEGATIVE 05/26/2017 1304   LEUKOCYTESUR NEGATIVE 05/26/2017 1304     RADIOLOGY: Dg Chest 2 View  Result Date: 09/07/2017 CLINICAL DATA:  77 year old male with chest pain onset this morning. History of lung cancer. EXAM: CHEST  2 VIEW COMPARISON:  Chest CTA 09/02/2017 and earlier. FINDINGS: Stable left chest Port-A-Cath. 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 A small right pleural effusion persists and is stable. The right suprahilar lung nodule described on 09/02/2017 persists (arrow). No new pulmonary opacity. Stable cardiac size and mediastinal contours. No acute osseous abnormality identified. Negative visible bowel gas pattern. IMPRESSION: Severe chronic lung disease. Stable chest since the radiographs and CTA on 09/02/2017, including small right pleural effusion and right upper lung nodule - please also see that CTA report. Electronically Signed   By: Genevie Ann M.D.   On: 09/07/2017 10:23   Ct Angio Chest Pe W And/or Wo Contrast  Result Date:  09/07/2017 CLINICAL DATA:  Chest pain EXAM: CT ANGIOGRAPHY CHEST WITH CONTRAST TECHNIQUE: Multidetector CT imaging of the chest was performed using the standard protocol during bolus administration of intravenous contrast. Multiplanar CT image reconstructions and MIPs were obtained to evaluate the vascular anatomy. CONTRAST:  32m ISOVUE-370 IOPAMIDOL (ISOVUE-370) INJECTION 76% COMPARISON:  CT angiogram chest September 02, 2017; chest radiograph September 07, 2017 the FINDINGS: Cardiovascular: There is no demonstrable pulmonary embolus. Ascending thoracic aortic diameter measures 4.0 x 4.0 cm. There is no evident thoracic aortic dissection. There is mild calcification in the proximal left subclavian artery. There are foci of calcification in the thoracic aorta. There are multiple foci of coronary artery calcification. There is a minimal pericardial effusion. The pericardium does not appear appreciably thickened. Port-A-Cath tip is in the superior vena cava. The main pulmonary outflow tract measures 3.0 cm in diameter, a finding concerning for a degree of pulmonary arterial hypertension. Mediastinum/Nodes: Thyroid appears unremarkable. No adenopathy is evident. No esophageal lesions are appreciable. 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 Fibrosis in the right perihilar region persists. There is a moderate pleural effusion on the right, stable. There is no edema or consolidation. The  previously noted nodular opacity in the posterior segment of the right upper lobe measures images 1.3 x 1.1 cm, stable. There is a new right apical and apicolateral pneumothorax without tension component. An area of pneumothorax is also noted in the medial right base. Upper Abdomen: In the visualized upper abdomen, there is atherosclerotic calcification in the aorta. There is a cyst arising from the left kidney posteriorly measuring 7.2 x 6.8 cm.  Visualized upper abdominal structures otherwise appear unremarkable. Musculoskeletal: There is degenerative change throughout the thoracic spine. There are no blastic or lytic bone lesions. Review of the MIP images confirms the above findings. IMPRESSION: 1. Apicolateral and basilar pneumothorax on the right, new. No tension component on the right. 2.  No evident pulmonary embolus. 3. Extensive underlying bullous emphysematous change with large bulla in the left upper lobe region causing traction. 4. Post radiation therapy change right perihilar region. The previously noted nodular lesion in the posterior segment right upper lobe measuring 1.3 x 1.1 cm is again noted. As noted on recent study, this lesion is concerning for focal neoplasm and may well warrant PET-CT to further evaluate. 5.  No evident adenopathy. 6. Ascending thoracic aortic diameter measures 4.0 x 4.0 cm. There is aortic atherosclerosis and coronary artery calcification. Recommend annual imaging followup by CTA or MRA. This recommendation follows 2010 ACCF/AHA/AATS/ACR/ASA/SCA/SCAI/SIR/STS/SVM Guidelines for the Diagnosis and Management of Patients with Thoracic Aortic Disease. Circulation. 2010; 121: Q762-U633. 7.  Small pericardial effusion. 8. Mild prominence of the main pulmonary outflow tract, concerning for pulmonary arterial hypertension. Aortic Atherosclerosis (ICD10-I70.0) and Emphysema (ICD10-J43.9). Critical Value/emergent results were called by telephone at the time of interpretation on 09/07/2017 at 1:39 pm to Dr. Eula Listen , who verbally acknowledged these results. Electronically Signed   By: Lowella Grip III M.D.   On: 09/07/2017 13:40    EKG: Orders placed or performed during the hospital encounter of 09/07/17  . EKG 12-Lead  . EKG 12-Lead  . ED EKG within 10 minutes  . ED EKG within 10 minutes    IMPRESSION AND PLAN: * Small pneumothorax     monitor in hospital with cardiac monitoring    I have explained  the patient about the symptoms of sudden worsening shortness of breath or chest pain and he needs to let the nurse know right away and we may have to do stat x-ray at that point.   Will continue nebulizer therapy, he denies any excessive coughing currently.   * History of COPD    Currently stable, continue with DuoNeb. Continue Spiriva.   * History of CHF    Continue home medications.   * History of A. fib    Continue amiodarone, metoprolol, and anticoagulation .  * Diabetes      Continue medication and keep on sliding scale coverage.   All the records are reviewed and case discussed with ED provider. Management plans discussed with the patient, family and they are in agreement.  CODE STATUS: full code  Code Status History    Date Active Date Inactive Code Status Order ID Comments User Context   09/23/2016 00:48 09/26/2016 22:04 Full Code [id number]  Lance Coon, MD Inpatient   08/04/2015 22:38 08/05/2015 15:28 Full Code [id number]  Lance Coon, MD ED       TOTAL TIME TAKING CARE OF THIS PATIENT: 45 minutes.    Vaughan Basta M.D on 09/07/2017   Between 7am to 6pm - Pager - [phone]  After 6pm go to [email or web address] - password [email or web address]  [facility]  Office  [phone]  CC: Primary care physician; Danelle Berry, NP   Note: This dictation was prepared with Dragon dictation along with smaller phrase technology. Any transcriptional errors that result from this process are unintentional.

## 2017-09-07 NOTE — Progress Notes (Signed)
Family Meeting Note  Advance Directive:no  Today a meeting took place with the Patient.  The following clinical team members were present during this meeting:MD  The following were discussed:Patient's diagnosis: CHF, COPD, Pneumothorax. , Patient's progosis: Unable to determine and Goals for treatment: Full Code  Additional follow-up to be provided: PMD  Time spent during discussion:20 minutes  Vaughan Basta, MD

## 2017-09-08 ENCOUNTER — Observation Stay: Payer: Medicare Other

## 2017-09-08 LAB — CBC
HCT: 39.1 % — ABNORMAL LOW (ref 40.0–52.0)
Hemoglobin: 12.7 g/dL — ABNORMAL LOW (ref 13.0–18.0)
MCH: 29.3 pg (ref 26.0–34.0)
MCHC: 32.5 g/dL (ref 32.0–36.0)
MCV: 90.2 fL (ref 80.0–100.0)
Platelets: 133 10*3/uL — ABNORMAL LOW (ref 150–440)
RBC: 4.33 MIL/uL — ABNORMAL LOW (ref 4.40–5.90)
RDW: 16.7 % — ABNORMAL HIGH (ref 11.5–14.5)
WBC: 5.8 10*3/uL (ref 3.8–10.6)

## 2017-09-08 LAB — BASIC METABOLIC PANEL
Anion gap: 9 (ref 5–15)
BUN: 26 mg/dL — AB (ref 6–20)
CHLORIDE: 108 mmol/L (ref 101–111)
CO2: 24 mmol/L (ref 22–32)
CREATININE: 1.38 mg/dL — AB (ref 0.61–1.24)
Calcium: 9.3 mg/dL (ref 8.9–10.3)
GFR calc Af Amer: 56 mL/min — ABNORMAL LOW (ref >60)
GFR calc non Af Amer: 48 mL/min — ABNORMAL LOW (ref >60)
Glucose, Bld: 127 mg/dL — ABNORMAL HIGH (ref 65–99)
Potassium: 3.9 mmol/L (ref 3.5–5.1)
SODIUM: 141 mmol/L (ref 135–145)

## 2017-09-08 LAB — GLUCOSE, CAPILLARY
GLUCOSE-CAPILLARY: 168 mg/dL — AB (ref 65–99)
Glucose-Capillary: 90 mg/dL (ref 65–99)
Glucose-Capillary: 151 mg/dL — ABNORMAL HIGH (ref 65–99)

## 2017-09-08 MED ORDER — DOXYCYCLINE HYCLATE 100 MG PO TABS: 100.0000 mg | ORAL_TABLET | Freq: Two times a day (BID) | ORAL | 0 refills | Status: AC

## 2017-09-08 MED ORDER — DOCUSATE SODIUM 100 MG PO CAPS
200.0000 mg | ORAL_CAPSULE | Freq: Two times a day (BID) | ORAL | Status: DC
  Administered 2017-09-08: 200 mg via ORAL
  Filled 2017-09-08: qty 2

## 2017-09-08 MED ORDER — DOXYCYCLINE HYCLATE 100 MG PO TABS
100.0000 mg | ORAL_TABLET | Freq: Two times a day (BID) | ORAL | Status: DC
  Administered 2017-09-08: 100 mg via ORAL
  Filled 2017-09-08: qty 1

## 2017-09-08 MED ORDER — DOCUSATE SODIUM 100 MG PO CAPS: 200.0000 mg | ORAL_CAPSULE | Freq: Two times a day (BID) | ORAL | 0 refills | Status: DC

## 2017-09-08 MED ORDER — OXYCODONE HCL 10 MG PO TABS: 10.0000 mg | ORAL_TABLET | ORAL | 0 refills | Status: DC | PRN

## 2017-09-08 NOTE — Progress Notes (Signed)
Patient discharged home as ordered,instructions explained and well understood,follow up appointments made,escorted by spouse and Engineer, manufacturing via wheel chair

## 2017-09-08 NOTE — Discharge Instructions (Signed)
[place] at [place]:  Cardiac diet  DISCHARGE CONDITION:  Stable  ACTIVITY:  Activity as tolerated  OXYGEN:  Home Oxygen: No.   Oxygen Delivery: room air  DISCHARGE LOCATION:  home    ADDITIONAL DISCHARGE INSTRUCTION: if chest pain worsens or sob worst return to ed   If you experience worsening of your admission symptoms, develop shortness of breath, life threatening emergency, suicidal or homicidal thoughts you must seek medical attention immediately by calling 911 or calling your MD immediately  if symptoms less severe.  You Must read complete instructions/literature along with all the possible adverse reactions/side effects for all the Medicines you take and that have been prescribed to you. Take any new Medicines after you have completely understood and accpet all the possible adverse reactions/side effects.   Please note  You were cared for by a hospitalist during your hospital stay. If you have any questions about your discharge medications or the care you received while you were in the hospital after you are discharged, you can call the unit and asked to speak with the hospitalist on call if the hospitalist that took care of you is not available. Once you are discharged, your primary care physician will handle any further medical issues. Please note that NO REFILLS for any discharge medications will be authorized once you are discharged, as it is imperative that you return to your primary care physician (or establish a relationship with a primary care physician if you do not have one) for your aftercare needs so that they can reassess your need for medications and monitor your lab values.

## 2017-09-08 NOTE — Care Management Obs Status (Signed)
[facility] NOTIFICATION   Patient Details  Name: Chris Wilkinson MRN: [id number] Date of Birth: 03/23/1941   Medicare Observation Status Notification Given:  No Discharge order placed in < 24hr of being placed on observation   Katrina Stack, RN 09/08/2017, 9:33 AM

## 2017-09-10 NOTE — Discharge Summary (Signed)
[place] at [facility], 77 y.o., DOB 1941-02-03, MRN [id number]. Admission date: 09/07/2017 Discharge Date 09/10/2017 Primary MD Danelle Berry, NP Admitting Physician Vaughan Basta, MD  Admission Diagnosis  Hypokalemia [E87.6] Primary spontaneous pneumothorax [J93.11] Pneumothorax [J93.9]  Discharge Diagnosis   Principal Problem: Chest pain related to small pneumothorax History of COPD History of CHF type unknown Atrial fibrillation Diabetes    Hospital Course  Damen Windsor  is a 77 y.o. male with a known history of lung cancer status post radiation and chemotherapy, CHF, COPD, coronary artery disease, diabetes, gastric surgery for disease, hypertension, pulmonary emboli- was having some chest pain both side lower ribs and in the back so came to emergency room, on workup he was noted to have right-sided pneumothorax which is small.  Patient was admitted to observation.  He was very anxious to go home in the morning.  He had no further pain and was stable for discharge.  Patient will need to follow-up with his primary pulmonologist            Consults  None  Significant Tests:  See full reports for all details    Dg Chest 2 View  Result Date: 09/08/2017 CLINICAL DATA:  History of pneumothorax, history of lung carcinoma post radiation and chemotherapy EXAM: CHEST  2 VIEW COMPARISON:  CT chest of 09/07/2017 and chest x-ray of the same day FINDINGS: Severe emphysematous change is as noted previously particularly throughout the left lung with a large bullous formation is most noticeable. The nodule previously described in the medial right upper lobe is identified on the frontal projection and is unchanged. There are small air-fluid levels at the right lung base which could represent infected bulla or possibly loculated hydropneumothoraces. Has any interventional study been performed on the right in the interval? Radiation  fibrosis along the mediastinum is stable as is heart size. No bony abnormality is seen. Left-sided Port-A-Cath remains with the tip seen to just above the SVC-RA junction. IMPRESSION: 1. Severe emphysematous change particularly throughout the left lung where large bullous formation is present. 2. Stable right upper lobe lung nodule medially. 3. Apparent air-fluid levels at the right lung base may represent infected bulla or fluid within below versus loculated hydropneumothoraces. There does appear to be a small right basilar pneumothorax on recent CT of the chest, and these areas may represent small hydropneumothoraces. Electronically Signed   By: Ivar Drape M.D.   On: 09/08/2017 08:56   Dg Chest 2 View  Result Date: 09/07/2017 CLINICAL DATA:  77 year old male with chest pain onset this morning. History of lung cancer. EXAM: CHEST  2 VIEW COMPARISON:  Chest CTA 09/02/2017 and earlier. FINDINGS: Stable left chest Port-A-Cath. 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 A small right pleural effusion persists and is stable. The right suprahilar lung nodule described on 09/02/2017 persists (arrow). No new pulmonary opacity. Stable cardiac size and mediastinal contours. No acute osseous abnormality identified. Negative visible bowel gas pattern. IMPRESSION: Severe chronic lung disease. Stable chest since the radiographs and CTA on 09/02/2017, including small right pleural effusion and right upper lung nodule - please also see that CTA report. Electronically Signed   By: Genevie Ann M.D.   On: 09/07/2017 10:23   Dg Chest 2 View  Result Date: 09/02/2017 CLINICAL DATA:  Shortness of breath EXAM: CHEST  2 VIEW COMPARISON:  CT chest 07/15/2017, radiograph 04/21/2017  FINDINGS: Left-sided central venous port tip overlies the distal SVC. Hyperinflation with severe emphysematous disease and large lucent bulla in the left chest as before.  Right parahilar distortion and interstitial disease is similar compared to prior. Scarring at the left lung base medially. There are small pleural effusions. 16 mm right suprahilar lung nodule IMPRESSION: 1. Post treatment changes on the right with severe emphysematous disease and areas of scarring, unchanged 2. Small pleural effusions 3. 16 mm right upper lung pulmonary nodule, could represent enlargement of a nodule noted on the most recent CT from January 2019 for which close imaging follow-up was recommended. Electronically Signed   By: Donavan Foil M.D.   On: 09/02/2017 02:19   Ct Angio Chest Pe W And/or Wo Contrast  Result Date: 09/07/2017 CLINICAL DATA:  Chest pain EXAM: CT ANGIOGRAPHY CHEST WITH CONTRAST TECHNIQUE: Multidetector CT imaging of the chest was performed using the standard protocol during bolus administration of intravenous contrast. Multiplanar CT image reconstructions and MIPs were obtained to evaluate the vascular anatomy. CONTRAST:  73mL ISOVUE-370 IOPAMIDOL (ISOVUE-370) INJECTION 76% COMPARISON:  CT angiogram chest September 02, 2017; chest radiograph September 07, 2017 the FINDINGS: Cardiovascular: There is no demonstrable pulmonary embolus. Ascending thoracic aortic diameter measures 4.0 x 4.0 cm. There is no evident thoracic aortic dissection. There is mild calcification in the proximal left subclavian artery. There are foci of calcification in the thoracic aorta. There are multiple foci of coronary artery calcification. There is a minimal pericardial effusion. The pericardium does not appear appreciably thickened. Port-A-Cath tip is in the superior vena cava. The main pulmonary outflow tract measures 3.0 cm in diameter, a finding concerning for a degree of pulmonary arterial hypertension. Mediastinum/Nodes: Thyroid appears unremarkable. No adenopathy is evident. No esophageal lesions are appreciable. Lungs/Pleura: There is widespread bullous emphysema with a large bulla with  associated traction in the left upper lobe, stable. Fibrosis in the right perihilar region persists. There is a moderate pleural effusion on the right, stable. There is no edema or consolidation. The previously noted nodular opacity in the posterior segment of the right upper lobe measures images 1.3 x 1.1 cm, stable. There is a new right apical and apicolateral pneumothorax without tension component. An area of pneumothorax is also noted in the medial right base. Upper Abdomen: In the visualized upper abdomen, there is atherosclerotic calcification in the aorta. There is a cyst arising from the left kidney posteriorly measuring 7.2 x 6.8 cm. Visualized upper abdominal structures otherwise appear unremarkable. Musculoskeletal: There is degenerative change throughout the thoracic spine. There are no blastic or lytic bone lesions. Review of the MIP images confirms the above findings. IMPRESSION: 1. Apicolateral and basilar pneumothorax on the right, new. No tension component on the right. 2.  No evident pulmonary embolus. 3. Extensive underlying bullous emphysematous change with large bulla in the left upper lobe region causing traction. 4. Post radiation therapy change right perihilar region. The previously noted nodular lesion in the posterior segment right upper lobe measuring 1.3 x 1.1 cm is again noted. As noted on recent study, this lesion is concerning for focal neoplasm and may well warrant PET-CT to further evaluate. 5.  No evident adenopathy. 6. Ascending thoracic aortic diameter measures 4.0 x 4.0 cm. There is aortic atherosclerosis and coronary artery calcification. Recommend annual imaging followup by CTA or MRA. This recommendation follows 2010 ACCF/AHA/AATS/ACR/ASA/SCA/SCAI/SIR/STS/SVM Guidelines for the Diagnosis and Management of Patients with Thoracic Aortic Disease. Circulation. 2010; 121: J478-G956. 7.  Small pericardial effusion. 8. Mild  prominence of the main pulmonary outflow tract, concerning  for pulmonary arterial hypertension. Aortic Atherosclerosis (ICD10-I70.0) and Emphysema (ICD10-J43.9). Critical Value/emergent results were called by telephone at the time of interpretation on 09/07/2017 at 1:39 pm to Dr. Eula Listen , who verbally acknowledged these results. Electronically Signed   By: Lowella Grip III M.D.   On: 09/07/2017 13:40   Ct Angio Chest Pe W/cm &/or Wo Cm  Result Date: 09/02/2017 CLINICAL DATA:  Short of breath, history of lung cancer EXAM: CT ANGIOGRAPHY CHEST WITH CONTRAST TECHNIQUE: Multidetector CT imaging of the chest was performed using the standard protocol during bolus administration of intravenous contrast. Multiplanar CT image reconstructions and MIPs were obtained to evaluate the vascular anatomy. CONTRAST:  27mL ISOVUE-370 IOPAMIDOL (ISOVUE-370) INJECTION 76% COMPARISON:  Radiograph 09/02/2017, CT 07/15/2017, 04/21/2017, 03/10/2017, 12/31/2016, PET-CT 10/10/2016 FINDINGS: Cardiovascular: Satisfactory opacification of the pulmonary arteries to the segmental level. No evidence of pulmonary embolism. Ectasia of the ascending aorta, measuring up to 4 cm. No dissection. Moderate aortic atherosclerosis. Coronary artery calcification. Borderline heart size. No significant pericardial effusion Mediastinum/Nodes: Midline trachea. No thyroid mass. No enlarging mediastinal lymph nodes. Esophagus within normal limits. Lungs/Pleura: Small moderate right pleural effusion which is loculated superiorly. Severe emphysema with large bulla in the left lung. Scarring in the left parahilar region. Architectural distortion, fibrosis and density in the right perihilar region consistent with post treatment changes. Residual soft tissue density in the region of the prior mass is stable and measures 3 by 2.6 cm. Interval increase in size of a right upper lobe lung nodule now measuring 13 mm. Upper Abdomen: Large cyst arising from the upper pole of the left kidney. Calcified granuloma  in the liver. Musculoskeletal: Degenerative changes of the spine. Review of the MIP images confirms the above findings. IMPRESSION: 1. Negative for acute pulmonary embolus. 2. Small to moderate right pleural effusion with loculation superiorly. 3. Severe emphysema with fibrosis, distortion and post treatment changes in the right lung. Pulmonary nodule in the right upper lobe appears slightly increased in size, now measuring 13 mm, compared with 11 mm previously. Further evaluation with PET-CT is suggested. 4. 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 Electronically Signed   By: Donavan Foil M.D.   On: 09/02/2017 03:06       Today   Subjective:   Alan Mulder   Pt feeling better no cp  Objective:   Blood pressure (!) 103/54, pulse 94, temperature 98.5 F (36.9 C), temperature source Oral, resp. rate 18, height 5\' 11"  (1.803 m), weight 193 lb 6.4 oz (87.7 kg), SpO2 96 %.  . No intake or output data in the 24 hours ending 09/10/17 1612  Exam VITAL SIGNS: Blood pressure (!) 103/54, pulse 94, temperature 98.5 F (36.9 C), temperature source Oral, resp. rate 18, height 5\' 11"  (1.803 m), weight 193 lb 6.4 oz (87.7 kg), SpO2 96 %.  GENERAL:  77 y.o.-year-old patient lying in the bed with no acute distress.  EYES: Pupils equal, round, reactive to light and accommodation. No scleral icterus. Extraocular muscles intact.  HEENT: Head atraumatic, normocephalic. Oropharynx and nasopharynx clear.  NECK:  Supple, no jugular venous distention. No thyroid enlargement, no tenderness.  LUNGS: Normal breath sounds bilaterally, no wheezing, rales,rhonchi or crepitation. No use of accessory muscles of respiration.  CARDIOVASCULAR: S1, S2 normal. No murmurs, rubs, or gallops.  ABDOMEN: Soft, nontender, nondistended. Bowel sounds present. No organomegaly or mass.  EXTREMITIES: No  pedal edema, cyanosis, or clubbing.   NEUROLOGIC: Cranial nerves II through XII are intact. Muscle strength 5/5 in all extremities. Sensation intact. Gait not checked.  PSYCHIATRIC: The patient is alert and oriented x 3.  SKIN: No obvious rash, lesion, or ulcer.   Data Review     CBC w Diff:  Lab Results  Component Value Date   WBC 5.8 09/08/2017   HGB 12.7 (L) 09/08/2017   HGB 13.5 01/17/2014   HCT 39.1 (L) 09/08/2017   HCT 41.7 01/17/2014   PLT 133 (L) 09/08/2017   PLT 133 (L) 01/17/2014   LYMPHOPCT 30 07/15/2017   MONOPCT 16 07/15/2017   EOSPCT 5 07/15/2017   BASOPCT 1 07/15/2017   CMP:  Lab Results  Component Value Date   NA 141 09/08/2017   NA 143 08/12/2017   NA 140 01/17/2014   K 3.9 09/08/2017   K 3.7 01/17/2014   CL 108 09/08/2017   CL 108 (H) 01/17/2014   CO2 24 09/08/2017   CO2 23 01/17/2014   BUN 26 (H) 09/08/2017   BUN 18 08/12/2017   BUN 18 01/17/2014   CREATININE 1.38 (H) 09/08/2017   CREATININE 1.44 (H) 01/17/2014   PROT 7.9 09/07/2017   PROT 7.7 08/12/2017   ALBUMIN 3.3 (L) 09/07/2017   ALBUMIN 3.7 08/12/2017   BILITOT 0.5 09/07/2017   BILITOT 0.3 08/12/2017   ALKPHOS 100 09/07/2017   AST 27 09/07/2017   ALT 53 09/07/2017  .  Micro Results No results found for this or any previous visit (from the past 240 hour(s)).   Code Status History    Date Active Date Inactive Code Status Order ID Comments User Context   09/07/2017 19:56 09/08/2017 16:12 Full Code [id number]  Vaughan Basta, MD Inpatient   09/23/2016 00:48 09/26/2016 22:04 Full Code [id number]  Lance Coon, MD Inpatient   08/04/2015 22:38 08/05/2015 15:28 Full Code [id number]  Lance Coon, MD ED          Follow-up Information    Danelle Berry, NP. Go on 09/15/2017.   Specialty:  Nurse Practitioner Why:  Appointment Time: 11:30am Contact information: [place] [address] [phone]        Allyne Gee, MD. Daphane Shepherd on 09/14/2017.   Specialties:  Internal Medicine, Pulmonary  Disease Why:  small pneumotrax, PLEASE KEEP AND GO TO APPOINTMENT ALREADY SCHEDULE FOR 09/14/17 AT 10:30am Contact information: [place] [address] [phone]           Discharge Medications   Allergies as of 09/08/2017   No Known Allergies     Medication List    STOP taking these medications   azithromycin 250 MG tablet Commonly known as:  ZITHROMAX Z-PAK     TAKE these medications   acetaminophen 325 MG tablet Commonly known as:  TYLENOL Take 2 tablets (650 mg total) by mouth every 6 (six) hours as needed for mild pain (or Fever >/= 101).   amiodarone 200 MG tablet Commonly known as:  PACERONE Take 200 mg by mouth daily.   apixaban 5 MG Tabs tablet Commonly known as:  ELIQUIS Take 1 tablet (5 mg total) by mouth 2 (two) times daily.   docusate sodium 100 MG capsule Commonly known as:  COLACE Take 2 capsules (200 mg total) by mouth 2 (two) times daily.   doxycycline 100 MG tablet Commonly known as:  VIBRA-TABS Take 1 tablet (100 mg total) by mouth every 12 (twelve) hours for 7 days.  furosemide 20 MG tablet Commonly known as:  LASIX Take 20 mg daily by mouth.   metFORMIN 500 MG tablet Commonly known as:  GLUCOPHAGE Take 500 mg by mouth 2 (two) times daily.   metoprolol succinate 50 MG 24 hr tablet Commonly known as:  TOPROL-XL Take 50 mg by mouth daily. Take with or immediately following a meal.   Oxycodone HCl 10 MG Tabs Take 1 tablet (10 mg total) by mouth every 4 (four) hours as needed.   primidone 50 MG tablet Commonly known as:  MYSOLINE Take 1 tablet by mouth daily.   simvastatin 10 MG tablet Commonly known as:  ZOCOR Take 10 mg by mouth every evening.   TRADJENTA 5 MG Tabs tablet Generic drug:  linagliptin Take 5 mg by mouth daily.   trimethoprim-polymyxin b ophthalmic solution Commonly known as:  POLYTRIM Place 1 drop into the left eye every 4 (four) hours.   Vitamin D (Ergocalciferol) 50000 units Caps  capsule Commonly known as:  DRISDOL Take 1 capsule by mouth every Thursday.          Total Time in preparing paper work, data evaluation and todays exam - 55 minutes  Dustin Flock M.D on 09/10/2017 at [place]  [facility]   Office  [phone]

## 2017-09-14 ENCOUNTER — Ambulatory Visit: Payer: Self-pay | Admitting: Internal Medicine

## 2017-09-17 ENCOUNTER — Encounter: Payer: Self-pay | Admitting: Internal Medicine

## 2017-09-17 ENCOUNTER — Telehealth: Payer: Self-pay | Admitting: Internal Medicine

## 2017-09-17 ENCOUNTER — Ambulatory Visit: Payer: Medicare Other | Admitting: Internal Medicine

## 2017-09-17 VITALS — BP 118/70 | HR 93 | Resp 16 | Ht 71.0 in | Wt 206.0 lb

## 2017-09-17 DIAGNOSIS — J189 Pneumonia, unspecified organism: Secondary | ICD-10-CM

## 2017-09-17 DIAGNOSIS — J9311 Primary spontaneous pneumothorax: Secondary | ICD-10-CM

## 2017-09-17 DIAGNOSIS — J449 Chronic obstructive pulmonary disease, unspecified: Secondary | ICD-10-CM

## 2017-09-17 DIAGNOSIS — R0602 Shortness of breath: Secondary | ICD-10-CM | POA: Diagnosis not present

## 2017-09-17 MED ORDER — MOMETASONE FURO-FORMOTEROL FUM 100-5 MCG/ACT IN AERO: 2.0000 | INHALATION_SPRAY | Freq: Two times a day (BID) | RESPIRATORY_TRACT | 4 refills | Status: DC

## 2017-09-17 NOTE — Progress Notes (Signed)
[facility] [place], [place] [address]  Pulmonary Sleep Medicine  Office Visit Note  Patient Name: Chris Wilkinson DOB: 1940-10-24 MRN [id number]  Date of Service: 09/17/2017  Complaints/HPI: He was seen in the ED with SOB. Patient had some pain also. He had a CT scan of the chest done which showed apical PTX. 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 Patient was admitted for observation and sent home. He is feeling better now. Patient also notes that he is not gaining benefit from his breo like he used to get the benefit. Patient has had some cough noted no congestion. He is using combivent and this seems to help him. He has no chest pain now no syncope no fevers or chills noted  ROS  General: (-) fever, (-) chills, (-) night sweats, (-) weakness Skin: (-) rashes, (-) itching,. Eyes: (-) visual changes, (-) redness, (-) itching. Nose and Sinuses: (-) nasal stuffiness or itchiness, (-) postnasal drip, (-) nosebleeds, (-) sinus trouble. Mouth and Throat: (-) sore throat, (-) hoarseness. Neck: (-) swollen glands, (-) enlarged thyroid, (-) neck pain. Respiratory: - cough, (-) bloody sputum, + shortness of breath, + wheezing. Cardiovascular: - ankle swelling, (-) chest pain. Lymphatic: (-) lymph node enlargement. Neurologic: (-) numbness, (-) tingling. Psychiatric: (-) anxiety, (-) depression   Current Medication: Outpatient Encounter Medications as of 09/17/2017  Medication Sig  . acetaminophen (TYLENOL) 325 MG tablet Take 2 tablets (650 mg total) by mouth every 6 (six) hours as needed for mild pain (or Fever >/= 101).  Marland Kitchen amiodarone (PACERONE) 200 MG tablet Take 200 mg by mouth daily.  Marland Kitchen apixaban (ELIQUIS) 5 MG TABS tablet Take 1 tablet (5 mg total) by mouth 2 (two) times daily.  Marland Kitchen docusate sodium (COLACE) 100 MG capsule Take 2 capsules (200 mg total) by mouth 2 (two) times daily.  . furosemide (LASIX) 20 MG tablet Take 20 mg daily by mouth.  . metFORMIN  (GLUCOPHAGE) 500 MG tablet Take 500 mg by mouth 2 (two) times daily.   . metoprolol succinate (TOPROL-XL) 50 MG 24 hr tablet Take 50 mg by mouth daily. Take with or immediately following a meal.  . Oxycodone HCl 10 MG TABS Take 1 tablet (10 mg total) by mouth every 4 (four) hours as needed.  . primidone (MYSOLINE) 50 MG tablet Take 1 tablet by mouth daily.  . simvastatin (ZOCOR) 10 MG tablet Take 10 mg by mouth every evening.   . TRADJENTA 5 MG TABS tablet Take 5 mg by mouth daily.  Marland Kitchen trimethoprim-polymyxin b (POLYTRIM) ophthalmic solution Place 1 drop into the left eye every 4 (four) hours. (Patient not taking: Reported on 09/07/2017)  . Vitamin D, Ergocalciferol, (DRISDOL) 50000 units CAPS capsule Take 1 capsule by mouth every Thursday.  . [DISCONTINUED] prochlorperazine (COMPAZINE) 10 MG tablet Take 1 tablet (10 mg total) by mouth every 6 (six) hours as needed (Nausea or vomiting). (Patient not taking: Reported on 12/04/2016)   Facility-Administered Encounter Medications as of 09/17/2017  Medication  . sodium chloride flush (NS) 0.9 % injection 10 mL    Surgical History: Past Surgical History:  Procedure Laterality Date  . COLONOSCOPY WITH PROPOFOL N/A 01/18/2015   Procedure: COLONOSCOPY WITH PROPOFOL;  Surgeon: Manya Silvas, MD;  Location: [facility] ENDOSCOPY;  Service: Endoscopy;  Laterality: N/A;  . ESOPHAGOGASTRODUODENOSCOPY N/A 01/18/2015   Procedure: ESOPHAGOGASTRODUODENOSCOPY (EGD);  Surgeon: Manya Silvas, MD;  Location[facility] ENDOSCOPY;  Service: Endoscopy;  Laterality: N/A;  . EYE SURGERY    .  FLEXIBLE BRONCHOSCOPY N/A 10/22/2016   Procedure: FLEXIBLE BRONCHOSCOPY;  Surgeon: Allyne Gee, MD;  Location: [facility] ORS;  Service: Pulmonary;  Laterality: N/A;  . HERNIA REPAIR    . IR FLUORO GUIDE PORT INSERTION LEFT  11/07/2016  . REPAIR KNEE LIGAMENT    . TONSILLECTOMY      Medical History: Past Medical History:  Diagnosis Date  . Cancer ([place])    Lung CA  . CHF (congestive heart  failure) ([place])   . COPD (chronic obstructive pulmonary disease) ([place])   . Coronary artery disease   . Diabetes mellitus without complication ([place])   . Fibromyalgia   . GERD (gastroesophageal reflux disease)   . Hypertension   . Pulmonary emboli ([facility])     Family History: Family History  Problem Relation Age of Onset  . Prostate cancer Father   . Prostate cancer Brother   . Diabetes Unknown        all 13 siblings  . Hypertension Unknown        all 13 siblings    Social History: Social History   Socioeconomic History  . Marital status: Married    Spouse name: Not on file  . Number of children: Not on file  . Years of education: Not on file  . Highest education level: Not on file  Social Needs  . Financial resource strain: Not on file  . Food insecurity - worry: Not on file  . Food insecurity - inability: Not on file  . Transportation needs - medical: Not on file  . Transportation needs - non-medical: Not on file  Occupational History  . Occupation: retired  Tobacco Use  . Smoking status: Former Smoker    Last attempt to quit: 12/01/2003    Years since quitting: 13.8  . Smokeless tobacco: Never Used  Substance and Sexual Activity  . Alcohol use: Yes    Alcohol/week: 1.2 oz    Types: 2 Shots of liquor per week    Comment: on the weeknd 2 shots  . Drug use: No  . Sexual activity: Not on file  Other Topics Concern  . Not on file  Social History Narrative  . Not on file    Vital Signs: Blood pressure 118/70, pulse 93, resp. rate 16, height _0  (1.803 m), weight 206 lb (93.4 kg), SpO2 94 %.  Examination: General Appearance: The patient is well-developed, well-nourished, and in no distress. Skin: Gross inspection of skin unremarkable. Head: normocephalic, no gross deformities. Eyes: no gross deformities noted. ENT: ears appear grossly normal no exudates. Neck: Supple. No thyromegaly. No LAD. Respiratory: scattered rhonchi diminished. Cardiovascular: Normal  S1 and S2 without murmur or rub. Extremities: No cyanosis. pulses are equal. Neurologic: Alert and oriented. No involuntary movements.  LABS: Recent Results (from the past 2160 hour(s))  Comprehensive metabolic panel     Status: Abnormal   Collection Time: 07/15/17  9:34 AM  Result Value Ref Range   Sodium 141 135 - 145 mmol/L   Potassium 3.9 3.5 - 5.1 mmol/L   Chloride 106 101 - 111 mmol/L   CO2 25 22 - 32 mmol/L   Glucose, Bld 149 (H) 65 - 99 mg/dL   BUN 15 6 - 20 mg/dL   Creatinine, Ser 1.40 (H) 0.61 - 1.24 mg/dL   Calcium 9.6 8.9 - 10.3 mg/dL   Total Protein 8.0 6.5 - 8.1 g/dL   Albumin 3.3 (L) 3.5 - 5.0 g/dL   AST 25 15 - 41 U/L   ALT  29 17 - 63 U/L   Alkaline Phosphatase 94 38 - 126 U/L   Total Bilirubin 0.5 0.3 - 1.2 mg/dL   GFR calc non Af Amer 48 (L) >60 mL/min   GFR calc Af Amer 55 (L) >60 mL/min    Comment: (NOTE) The eGFR has been calculated using the CKD EPI equation. This calculation has not been validated in all clinical situations. eGFR's persistently <60 mL/min signify possible Chronic Kidney Disease.    Anion gap 10 5 - 15    Comment: Performed at [facility], [address] [place], [place] [address]  CBC with Differential/Platelet     Status: Abnormal   Collection Time: 07/15/17  9:34 AM  Result Value Ref Range   WBC 4.7 3.8 - 10.6 K/uL   RBC 4.32 (L) 4.40 - 5.90 MIL/uL   Hemoglobin 12.6 (L) 13.0 - 18.0 g/dL   HCT 38.1 (L) 40.0 - 52.0 %   MCV 88.2 80.0 - 100.0 fL   MCH 29.1 26.0 - 34.0 pg   MCHC 33.0 32.0 - 36.0 g/dL   RDW 15.9 (H) 11.5 - 14.5 %   Platelets 151 150 - 440 K/uL   Neutrophils Relative % 48 %   Neutro Abs 2.3 1.4 - 6.5 K/uL   Lymphocytes Relative 30 %   Lymphs Abs 1.4 1.0 - 3.6 K/uL   Monocytes Relative 16 %   Monocytes Absolute 0.8 0.2 - 1.0 K/uL   Eosinophils Relative 5 %   Eosinophils Absolute 0.2 0 - 0.7 K/uL   Basophils Relative 1 %   Basophils Absolute 0.0 0 - 0.1 K/uL    Comment: Performed at [facility],  [address] [place], [place] [address]  Magnesium     Status: None   Collection Time: 08/12/17  2:11 PM  Result Value Ref Range   Magnesium 1.8 1.6 - 2.3 mg/dL  Vitamin B12     Status: None   Collection Time: 08/12/17  2:11 PM  Result Value Ref Range   Vitamin B-12 401 232 - 1,245 pg/mL  Comp. Metabolic Panel (12)     Status: Abnormal   Collection Time: 08/12/17  2:11 PM  Result Value Ref Range   Glucose 108 (H) 65 - 99 mg/dL   BUN 18 8 - 27 mg/dL   Creatinine, Ser 1.79 (H) 0.76 - 1.27 mg/dL   GFR calc non Af Amer 36 (L) >59 mL/min/1.73   GFR calc Af Amer 42 (L) >59 mL/min/1.73   BUN/Creatinine Ratio 10 10 - 24   Sodium 143 134 - 144 mmol/L   Potassium 4.7 3.5 - 5.2 mmol/L   Chloride 107 (H) 96 - 106 mmol/L   Calcium 9.9 8.6 - 10.2 mg/dL   Total Protein 7.7 6.0 - 8.5 g/dL   Albumin 3.7 3.5 - 4.8 g/dL   Globulin, Total 4.0 1.5 - 4.5 g/dL   Albumin/Globulin Ratio 0.9 (L) 1.2 - 2.2   Bilirubin Total 0.3 0.0 - 1.2 mg/dL   Alkaline Phosphatase 116 39 - 117 IU/L   AST 20 0 - 40 IU/L  Sedimentation rate     Status: Abnormal   Collection Time: 08/12/17  2:11 PM  Result Value Ref Range   Sed Rate 53 (H) 0 - 30 mm/hr  25-Hydroxyvitamin D Lcms D2+D3     Status: Abnormal   Collection Time: 08/12/17  2:11 PM  Result Value Ref Range   25-Hydroxy, Vitamin D 8.0 (L) ng/mL    Comment: Reference Range: All  Ages: Target levels 30 - 100    25-Hydroxy, Vitamin D-2 <1.0 ng/mL   25-Hydroxy, Vitamin D-3 7.9 ng/mL  C-reactive protein     Status: Abnormal   Collection Time: 08/12/17  2:11 PM  Result Value Ref Range   CRP 8.1 (H) 0.0 - 4.9 mg/L  Compliance Drug Analysis, Ur     Status: None   Collection Time: 08/12/17  3:59 PM  Result Value Ref Range   Summary FINAL     Comment: ==================================================================== TOXASSURE COMP DRUG ANALYSIS,UR ==================================================================== Test                             Result        Flag       Units Drug Present and Declared for Prescription Verification   Metoprolol                     PRESENT      EXPECTED Drug Present not Declared for Prescription Verification   Primidone                      PRESENT      UNEXPECTED   Phenobarbital                  PRESENT      UNEXPECTED    Phenobarbital is an expected metabolite of primidone;    Phenobarbital may also be administered as a prescription drug. Drug Absent but Declared for Prescription Verification   Acetaminophen                  Not Detected UNEXPECTED    Acetaminophen, as indicated in the declared medication list, is    not always detected even when used as directed. ==================================================================== Test                      Result    Flag   Units       Ref Range   Creatinine              121              mg/dL      >=20 ==================================================================== Declared Medications:  The flagging and interpretation on this report are based on the  following declared medications.  Unexpected results may arise from  inaccuracies in the declared medications.  **Note: The testing scope of this panel includes these medications:  Metoprolol  **Note: The testing scope of this panel does not include small to  moderate amounts of these reported medications:  Acetaminophen  **Note: The testing scope of this panel does not include following  reported medications:  Amiodarone  Apixaban  Fluticasone  Furosemide  Metformin  Simvastatin  Vilanterol ==================================================================== For clinical consultation, please call [phone]. ====================================================================   Basic metabolic panel     Status: Abnormal   Collection Time: 09/02/17  1:43 AM  Result Value Ref Range   Sodium 141 135 - 145 mmol/L   Potassium 3.9 3.5 - 5.1 mmol/L   Chloride 108 101 - 111 mmol/L   CO2 26  22 - 32 mmol/L   Glucose, Bld 140 (H) 65 - 99 mg/dL   BUN 15 6 - 20 mg/dL   Creatinine, Ser 1.55 (H) 0.61 - 1.24 mg/dL   Calcium 9.6 8.9 - 10.3 mg/dL   GFR calc non Af Amer 42 (L) >60 mL/min   GFR  calc Af Amer 49 (L) >60 mL/min    Comment: (NOTE) The eGFR has been calculated using the CKD EPI equation. This calculation has not been validated in all clinical situations. eGFR's persistently <60 mL/min signify possible Chronic Kidney Disease.    Anion gap 7 5 - 15    Comment: Performed at [facility], [address] [place], [place] [address]  CBC     Status: Abnormal   Collection Time: 09/02/17  1:43 AM  Result Value Ref Range   WBC 5.5 3.8 - 10.6 K/uL   RBC 4.57 4.40 - 5.90 MIL/uL   Hemoglobin 13.4 13.0 - 18.0 g/dL   HCT 41.1 40.0 - 52.0 %   MCV 90.1 80.0 - 100.0 fL   MCH 29.4 26.0 - 34.0 pg   MCHC 32.7 32.0 - 36.0 g/dL   RDW 16.4 (H) 11.5 - 14.5 %   Platelets 151 150 - 440 K/uL    Comment: Performed at [facility], [address] [place], [place] [address]  Troponin I     Status: None   Collection Time: 09/02/17  1:43 AM  Result Value Ref Range   Troponin I <0.03 <0.03 ng/mL    Comment: Performed at [facility], [address] [place], [place] [address]  CBC     Status: Abnormal   Collection Time: 09/07/17  9:49 AM  Result Value Ref Range   WBC 6.7 3.8 - 10.6 K/uL   RBC 4.20 (L) 4.40 - 5.90 MIL/uL   Hemoglobin 12.3 (L) 13.0 - 18.0 g/dL   HCT 37.9 (L) 40.0 - 52.0 %   MCV 90.3 80.0 - 100.0 fL   MCH 29.3 26.0 - 34.0 pg   MCHC 32.4 32.0 - 36.0 g/dL   RDW 16.6 (H) 11.5 - 14.5 %   Platelets 138 (L) 150 - 440 K/uL    Comment: Performed at [facility], [address] [place], [place] [address]  Troponin I     Status: None   Collection Time: 09/07/17  9:49 AM  Result Value Ref Range   Troponin I <0.03 <0.03 ng/mL    Comment: Performed at [facility], [address] [place], [place] [address]  Comprehensive metabolic  panel     Status: Abnormal   Collection Time: 09/07/17  9:49 AM  Result Value Ref Range   Sodium 136 135 - 145 mmol/L   Potassium 3.2 (L) 3.5 - 5.1 mmol/L   Chloride 105 101 - 111 mmol/L   CO2 24 22 - 32 mmol/L   Glucose, Bld 134 (H) 65 - 99 mg/dL   BUN 25 (H) 6 - 20 mg/dL   Creatinine, Ser 1.62 (H) 0.61 - 1.24 mg/dL   Calcium 9.4 8.9 - 10.3 mg/dL   Total Protein 7.9 6.5 - 8.1 g/dL   Albumin 3.3 (L) 3.5 - 5.0 g/dL   AST 27 15 - 41 U/L   ALT 53 17 - 63 U/L   Alkaline Phosphatase 100 38 - 126 U/L   Total Bilirubin 0.5 0.3 - 1.2 mg/dL   GFR calc non Af Amer 40 (L) >60 mL/min   GFR calc Af Amer 46 (L) >60 mL/min    Comment: (NOTE) The eGFR has been calculated using the CKD EPI equation. This calculation has not been validated in all clinical situations. eGFR's persistently <60 mL/min signify possible Chronic Kidney Disease.    Anion gap 7 5 - 15    Comment: Performed at [facility], [address] [place], [place] [address]  Troponin I     Status: None   Collection Time: 09/07/17 12:57 PM  Result Value Ref Range   Troponin I <0.03 <0.03 ng/mL    Comment: Performed at [facility] [address] [place], [place] [address]  Glucose, capillary     Status: None   Collection Time: 09/07/17  8:14 PM  Result Value Ref Range   Glucose-Capillary 90 65 - 99 mg/dL  Basic metabolic panel     Status: Abnormal   Collection Time: 09/08/17  5:14 AM  Result Value Ref Range   Sodium 141 135 - 145 mmol/L   Potassium 3.9 3.5 - 5.1 mmol/L   Chloride 108 101 - 111 mmol/L   CO2 24 22 - 32 mmol/L   Glucose, Bld 127 (H) 65 - 99 mg/dL   BUN 26 (H) 6 - 20 mg/dL   Creatinine, Ser 1.38 (H) 0.61 - 1.24 mg/dL   Calcium 9.3 8.9 - 10.3 mg/dL   GFR calc non Af Amer 48 (L) >60 mL/min   GFR calc Af Amer 56 (L) >60 mL/min    Comment: (NOTE) The eGFR has been calculated using the CKD EPI equation. This calculation has not been validated in all clinical situations. eGFR's  persistently <60 mL/min signify possible Chronic Kidney Disease.    Anion gap 9 5 - 15    Comment: Performed at [facility], [address] [place], [place] [address]  CBC     Status: Abnormal   Collection Time: 09/08/17  5:14 AM  Result Value Ref Range   WBC 5.8 3.8 - 10.6 K/uL   RBC 4.33 (L) 4.40 - 5.90 MIL/uL   Hemoglobin 12.7 (L) 13.0 - 18.0 g/dL   HCT 39.1 (L) 40.0 - 52.0 %   MCV 90.2 80.0 - 100.0 fL   MCH 29.3 26.0 - 34.0 pg   MCHC 32.5 32.0 - 36.0 g/dL   RDW 16.7 (H) 11.5 - 14.5 %   Platelets 133 (L) 150 - 440 K/uL    Comment: Performed at [facility], [address] [place], [place] [address]  Glucose, capillary     Status: Abnormal   Collection Time: 09/08/17  7:45 AM  Result Value Ref Range   Glucose-Capillary 151 (H) 65 - 99 mg/dL  Glucose, capillary     Status: Abnormal   Collection Time: 09/08/17 12:33 PM  Result Value Ref Range   Glucose-Capillary 168 (H) 65 - 99 mg/dL    Radiology: Dg Chest 2 View  Result Date: 09/08/2017 CLINICAL DATA:  History of pneumothorax, history of lung carcinoma post radiation and chemotherapy EXAM: CHEST  2 VIEW COMPARISON:  CT chest of 09/07/2017 and chest x-ray of the same day FINDINGS: Severe emphysematous change is as noted previously particularly throughout the left lung with a large bullous formation is most noticeable. The nodule previously described in the medial right upper lobe is identified on the frontal projection and is unchanged. There are small air-fluid levels at the right lung base which could represent infected bulla or possibly loculated hydropneumothoraces. Has any interventional study been performed on the right in the interval? Radiation fibrosis along the mediastinum is stable as is heart size. No bony abnormality is seen. Left-sided Port-A-Cath remains with the tip seen to just above the SVC-RA junction. IMPRESSION: 1. Severe emphysematous change particularly throughout the left lung where large  bullous formation is present. 2. Stable right upper lobe lung nodule medially. 3. Apparent air-fluid  levels at the right lung base may represent infected bulla or fluid within below versus loculated hydropneumothoraces. There does appear to be a small right basilar pneumothorax on recent CT of the chest, and these areas may represent small hydropneumothoraces. Electronically Signed   By: Ivar Drape M.D.   On: 09/08/2017 08:56   Dg Chest 2 View  Result Date: 09/07/2017 CLINICAL DATA:  77 year old male with chest pain onset this morning. History of lung cancer. EXAM: CHEST  2 VIEW COMPARISON:  Chest CTA 09/02/2017 and earlier. FINDINGS: Stable left chest Port-A-Cath. 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 A small right pleural effusion persists and is stable. The right suprahilar lung nodule described on 09/02/2017 persists (arrow). No new pulmonary opacity. Stable cardiac size and mediastinal contours. No acute osseous abnormality identified. Negative visible bowel gas pattern. IMPRESSION: Severe chronic lung disease. Stable chest since the radiographs and CTA on 09/02/2017, including small right pleural effusion and right upper lung nodule - please also see that CTA report. Electronically Signed   By: Genevie Ann M.D.   On: 09/07/2017 10:23   Ct Angio Chest Pe W And/or Wo Contrast  Result Date: 09/07/2017 CLINICAL DATA:  Chest pain EXAM: CT ANGIOGRAPHY CHEST WITH CONTRAST TECHNIQUE: Multidetector CT imaging of the chest was performed using the standard protocol during bolus administration of intravenous contrast. Multiplanar CT image reconstructions and MIPs were obtained to evaluate the vascular anatomy. CONTRAST:  64m ISOVUE-370 IOPAMIDOL (ISOVUE-370) INJECTION 76% COMPARISON:  CT angiogram chest September 02, 2017; chest radiograph September 07, 2017 the FINDINGS: Cardiovascular: There is no demonstrable pulmonary  embolus. Ascending thoracic aortic diameter measures 4.0 x 4.0 cm. There is no evident thoracic aortic dissection. There is mild calcification in the proximal left subclavian artery. There are foci of calcification in the thoracic aorta. There are multiple foci of coronary artery calcification. There is a minimal pericardial effusion. The pericardium does not appear appreciably thickened. Port-A-Cath tip is in the superior vena cava. The main pulmonary outflow tract measures 3.0 cm in diameter, a finding concerning for a degree of pulmonary arterial hypertension. Mediastinum/Nodes: Thyroid appears unremarkable. No adenopathy is evident. No esophageal lesions are appreciable. 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 Fibrosis in the right perihilar region persists. There is a moderate pleural effusion on the right, stable. There is no edema or consolidation. The previously noted nodular opacity in the posterior segment of the right upper lobe measures images 1.3 x 1.1 cm, stable. There is a new right apical and apicolateral pneumothorax without tension component. An area of pneumothorax is also noted in the medial right base. Upper Abdomen: In the visualized upper abdomen, there is atherosclerotic calcification in the aorta. There is a cyst arising from the left kidney posteriorly measuring 7.2 x 6.8 cm. Visualized upper abdominal structures otherwise appear unremarkable. Musculoskeletal: There is degenerative change throughout the thoracic spine. There are no blastic or lytic bone lesions. Review of the MIP images confirms the above findings. IMPRESSION: 1. Apicolateral and basilar pneumothorax on the right, new. No tension component on the right. 2.  No evident pulmonary embolus. 3. Extensive underlying bullous emphysematous change with large bulla in the left upper lobe region causing traction. 4. Post radiation therapy change  right perihilar region. The previously noted nodular lesion in the posterior segment right upper lobe measuring 1.3 x 1.1 cm is again  noted. As noted on recent study, this lesion is concerning for focal neoplasm and may well warrant PET-CT to further evaluate. 5.  No evident adenopathy. 6. Ascending thoracic aortic diameter measures 4.0 x 4.0 cm. There is aortic atherosclerosis and coronary artery calcification. Recommend annual imaging followup by CTA or MRA. This recommendation follows 2010 ACCF/AHA/AATS/ACR/ASA/SCA/SCAI/SIR/STS/SVM Guidelines for the Diagnosis and Management of Patients with Thoracic Aortic Disease. Circulation. 2010; 121: J696-V893. 7.  Small pericardial effusion. 8. Mild prominence of the main pulmonary outflow tract, concerning for pulmonary arterial hypertension. Aortic Atherosclerosis (ICD10-I70.0) and Emphysema (ICD10-J43.9). Critical Value/emergent results were called by telephone at the time of interpretation on 09/07/2017 at 1:39 pm to Dr. Eula Listen , who verbally acknowledged these results. Electronically Signed   By: Lowella Grip III M.D.   On: 09/07/2017 13:40    No results found.  Dg Chest 2 View  Result Date: 09/08/2017 CLINICAL DATA:  History of pneumothorax, history of lung carcinoma post radiation and chemotherapy EXAM: CHEST  2 VIEW COMPARISON:  CT chest of 09/07/2017 and chest x-ray of the same day FINDINGS: Severe emphysematous change is as noted previously particularly throughout the left lung with a large bullous formation is most noticeable. The nodule previously described in the medial right upper lobe is identified on the frontal projection and is unchanged. There are small air-fluid levels at the right lung base which could represent infected bulla or possibly loculated hydropneumothoraces. Has any interventional study been performed on the right in the interval? Radiation fibrosis along the mediastinum is stable as is heart size. No bony  abnormality is seen. Left-sided Port-A-Cath remains with the tip seen to just above the SVC-RA junction. IMPRESSION: 1. Severe emphysematous change particularly throughout the left lung where large bullous formation is present. 2. Stable right upper lobe lung nodule medially. 3. Apparent air-fluid levels at the right lung base may represent infected bulla or fluid within below versus loculated hydropneumothoraces. There does appear to be a small right basilar pneumothorax on recent CT of the chest, and these areas may represent small hydropneumothoraces. Electronically Signed   By: Ivar Drape M.D.   On: 09/08/2017 08:56   Dg Chest 2 View  Result Date: 09/07/2017 CLINICAL DATA:  77 year old male with chest pain onset this morning. History of lung cancer. EXAM: CHEST  2 VIEW COMPARISON:  Chest CTA 09/02/2017 and earlier. FINDINGS: Stable left chest Port-A-Cath. 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 A small right pleural effusion persists and is stable. The right suprahilar lung nodule described on 09/02/2017 persists (arrow). No new pulmonary opacity. Stable cardiac size and mediastinal contours. No acute osseous abnormality identified. Negative visible bowel gas pattern. IMPRESSION: Severe chronic lung disease. Stable chest since the radiographs and CTA on 09/02/2017, including small right pleural effusion and right upper lung nodule - please also see that CTA report. Electronically Signed   By: Genevie Ann M.D.   On: 09/07/2017 10:23   Dg Chest 2 View  Result Date: 09/02/2017 CLINICAL DATA:  Shortness of breath EXAM: CHEST  2 VIEW COMPARISON:  CT chest 07/15/2017, radiograph 04/21/2017 FINDINGS: Left-sided central venous port tip overlies the distal SVC. Hyperinflation with severe emphysematous disease and large lucent bulla in the left chest as before. Right parahilar distortion and interstitial disease is similar  compared to prior. Scarring at the left lung base medially. There are small pleural effusions. 16 mm right suprahilar lung nodule IMPRESSION: 1. Post  treatment changes on the right with severe emphysematous disease and areas of scarring, unchanged 2. Small pleural effusions 3. 16 mm right upper lung pulmonary nodule, could represent enlargement of a nodule noted on the most recent CT from January 2019 for which close imaging follow-up was recommended. Electronically Signed   By: Donavan Foil M.D.   On: 09/02/2017 02:19   Ct Angio Chest Pe W And/or Wo Contrast  Result Date: 09/07/2017 CLINICAL DATA:  Chest pain EXAM: CT ANGIOGRAPHY CHEST WITH CONTRAST TECHNIQUE: Multidetector CT imaging of the chest was performed using the standard protocol during bolus administration of intravenous contrast. Multiplanar CT image reconstructions and MIPs were obtained to evaluate the vascular anatomy. CONTRAST:  41m ISOVUE-370 IOPAMIDOL (ISOVUE-370) INJECTION 76% COMPARISON:  CT angiogram chest September 02, 2017; chest radiograph September 07, 2017 the FINDINGS: Cardiovascular: There is no demonstrable pulmonary embolus. Ascending thoracic aortic diameter measures 4.0 x 4.0 cm. There is no evident thoracic aortic dissection. There is mild calcification in the proximal left subclavian artery. There are foci of calcification in the thoracic aorta. There are multiple foci of coronary artery calcification. There is a minimal pericardial effusion. The pericardium does not appear appreciably thickened. Port-A-Cath tip is in the superior vena cava. The main pulmonary outflow tract measures 3.0 cm in diameter, a finding concerning for a degree of pulmonary arterial hypertension. Mediastinum/Nodes: Thyroid appears unremarkable. No adenopathy is evident. No esophageal lesions are appreciable. 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 Fibrosis in the  right perihilar region persists. There is a moderate pleural effusion on the right, stable. There is no edema or consolidation. The previously noted nodular opacity in the posterior segment of the right upper lobe measures images 1.3 x 1.1 cm, stable. There is a new right apical and apicolateral pneumothorax without tension component. An area of pneumothorax is also noted in the medial right base. Upper Abdomen: In the visualized upper abdomen, there is atherosclerotic calcification in the aorta. There is a cyst arising from the left kidney posteriorly measuring 7.2 x 6.8 cm. Visualized upper abdominal structures otherwise appear unremarkable. Musculoskeletal: There is degenerative change throughout the thoracic spine. There are no blastic or lytic bone lesions. Review of the MIP images confirms the above findings. IMPRESSION: 1. Apicolateral and basilar pneumothorax on the right, new. No tension component on the right. 2.  No evident pulmonary embolus. 3. Extensive underlying bullous emphysematous change with large bulla in the left upper lobe region causing traction. 4. Post radiation therapy change right perihilar region. The previously noted nodular lesion in the posterior segment right upper lobe measuring 1.3 x 1.1 cm is again noted. As noted on recent study, this lesion is concerning for focal neoplasm and may well warrant PET-CT to further evaluate. 5.  No evident adenopathy. 6. Ascending thoracic aortic diameter measures 4.0 x 4.0 cm. There is aortic atherosclerosis and coronary artery calcification. Recommend annual imaging followup by CTA or MRA. This recommendation follows 2010 ACCF/AHA/AATS/ACR/ASA/SCA/SCAI/SIR/STS/SVM Guidelines for the Diagnosis and Management of Patients with Thoracic Aortic Disease. Circulation. 2010; 121: [id number] 7.  Small pericardial effusion. 8. Mild prominence of the main pulmonary outflow tract, concerning for pulmonary arterial hypertension. Aortic Atherosclerosis  (ICD10-I70.0) and Emphysema (ICD10-J43.9). Critical Value/emergent results were called by telephone at the time of interpretation on 09/07/2017 at 1:39 pm to Dr. AEula Listen, who verbally acknowledged these results. Electronically Signed   By: WLowella GripIII M.D.  On: 09/07/2017 13:40   Ct Angio Chest Pe W/cm &/or Wo Cm  Result Date: 09/02/2017 CLINICAL DATA:  Short of breath, history of lung cancer EXAM: CT ANGIOGRAPHY CHEST WITH CONTRAST TECHNIQUE: Multidetector CT imaging of the chest was performed using the standard protocol during bolus administration of intravenous contrast. Multiplanar CT image reconstructions and MIPs were obtained to evaluate the vascular anatomy. CONTRAST:  37m ISOVUE-370 IOPAMIDOL (ISOVUE-370) INJECTION 76% COMPARISON:  Radiograph 09/02/2017, CT 07/15/2017, 04/21/2017, 03/10/2017, 12/31/2016, PET-CT 10/10/2016 FINDINGS: Cardiovascular: Satisfactory opacification of the pulmonary arteries to the segmental level. No evidence of pulmonary embolism. Ectasia of the ascending aorta, measuring up to 4 cm. No dissection. Moderate aortic atherosclerosis. Coronary artery calcification. Borderline heart size. No significant pericardial effusion Mediastinum/Nodes: Midline trachea. No thyroid mass. No enlarging mediastinal lymph nodes. Esophagus within normal limits. Lungs/Pleura: Small moderate right pleural effusion which is loculated superiorly. Severe emphysema with large bulla in the left lung. Scarring in the left parahilar region. Architectural distortion, fibrosis and density in the right perihilar region consistent with post treatment changes. Residual soft tissue density in the region of the prior mass is stable and measures 3 by 2.6 cm. Interval increase in size of a right upper lobe lung nodule now measuring 13 mm. Upper Abdomen: Large cyst arising from the upper pole of the left kidney. Calcified granuloma in the liver. Musculoskeletal: Degenerative changes of the  spine. Review of the MIP images confirms the above findings. IMPRESSION: 1. Negative for acute pulmonary embolus. 2. Small to moderate right pleural effusion with loculation superiorly. 3. Severe emphysema with fibrosis, distortion and post treatment changes in the right lung. Pulmonary nodule in the right upper lobe appears slightly increased in size, now measuring 13 mm, compared with 11 mm previously. Further evaluation with PET-CT is suggested. 4. 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 Electronically Signed   By: KDonavan FoilM.D.   On: 09/02/2017 03:06      Assessment and Plan: Patient Active Problem List   Diagnosis Date Noted  . Pneumothorax 09/07/2017  . Chronic throat pain (Primary Area of Pain) 08/12/2017  . Chronic secondary facial pain (Secondary Area of Pain) (R) 08/12/2017  . Chronic neck pain (Tertiary Area of Pain) 08/12/2017  . Chronic pain syndrome 08/12/2017  . Other long term (current) drug therapy 08/12/2017  . Disorder of bone, unspecified 08/12/2017  . Other specified health status 08/12/2017  . Long term current use of opiate analgesic 08/12/2017  . Encounter for antineoplastic immunotherapy 02/27/2017  . Carpal tunnel syndrome 12/04/2016  . Sprain of wrist 12/04/2016  . Goals of care, counseling/discussion 11/20/2016  . Lung cancer ([place] 11/04/2016  . HCAP (healthcare-associated pneumonia) 09/22/2016  . [place] 08/04/2015  . CAP (community acquired pneumonia) 08/04/2015  . COPD (chronic obstructive pulmonary disease) ([place] 08/04/2015  . Type 2 diabetes mellitus [place] 08/04/2015  . CAD (coronary artery disease) 08/04/2015  . HTN (hypertension) 08/04/2015  . GERD (gastroesophageal reflux disease) 08/04/2015  . Bloating 12/04/2014  . Gas 12/04/2014  . Hx of adenomatous polyp of colon 12/04/2014    1. Pneumothorax Will get follow up CT chest. Not able to  adequately visualized on regular CXR Hopefully resolved seems better clinically  2. COPD Will stop breo and will also place on dulera Continue combivent  3. GERD contineu with present therapy  4. CAP resolved   General Counseling: I have discussed the findings of the evaluation and examination with  Mareon.  I have also discussed any further diagnostic evaluation thatmay be needed or ordered today. Khalon verbalizes understanding of the findings of todays visit. We also reviewed his medications today and discussed drug interactions and side effects including but not limited excessive drowsiness and altered mental states. We also discussed that there is always a risk not just to him but also people around him. he has been encouraged to call the office with any questions or concerns that should arise related to todays visit.    Time spent: 46mn  I have personally obtained a history, examined the patient, evaluated laboratory and imaging results, formulated the assessment and plan and placed orders.    SAllyne Gee MD [facility]

## 2017-09-17 NOTE — Telephone Encounter (Signed)
COPY OF OV NOTE FROM 09/17/17 PUT INTO [facility] BOX TO BE PICKED UP.JW

## 2017-09-17 NOTE — Patient Instructions (Signed)
Nuclear Medicine Exam A nuclear medicine exam is a safe and painless imaging test. It helps your health care provider detect and diagnose disease in the body. It also provides information about the way your organs work and how they are structured. For a nuclear medicine exam, you will be given a radioactive tracer. This substance is absorbed by your body's organs. A large scanning machine detects the tracer and creates pictures of the areas that your health care provider wants to look at. There are several kinds of nuclear medicine exams. They include:  CT scan.  MRI.  PET scan.  Tell a health care provider about:  Any allergies you have.  All medicines you are taking, including vitamins, herbs, eye drops, creams, and over-the-counter medicines.  Any problems you or family members have had with anesthetic medicines.  Any blood disorders you have.  Any surgeries you have had.  Any medical conditions you have.  Whether you are pregnant or may be pregnant.  Whether you are nursing. What happens before the procedure?  Ask your health care provider about changing or stopping your regular medicines.  Follow instructions from your health care provider about eating or drinking restrictions.  Do not wear jewelry.  Wear loose, comfortable clothing. You may be asked to wear a hospital gown for the procedure.  Bring previous imaging studies, such as X-rays, with you to the exam if they are available. What happens during the procedure?  An IV tube may be inserted into one of your veins.  You will be asked to lie on a table or sit in a chair.  You will be given the radioactive tracer. You may get: ? A pill or liquid to swallow. ? An injection. ? Medicine through your IV tube. ? A gas to inhale.  A large scanning machine will be used to create images of your body. After the pictures are taken, you may have to wait so your health care provider can make sure that enough good images  were taken. The procedure may vary among health care providers and hospitals. What happens after the procedure?  You may go right home after the procedure and return to your usual activities, unless told otherwise by your health care provider.  Drink enough water to keep urine clear or pale yellow. This helps to flush the radioactive tracer out of your body.  It is your responsibility to get your test results. Ask your health care provider or the department performing the test when your results will be ready.  Seek immediate medical care if you have shortness of breath. This information is not intended to replace advice given to you by your health care provider. Make sure you discuss any questions you have with your health care provider. Document Released: 08/07/2004 Document Revised: 02/28/2016 Document Reviewed: 01/17/2015 Elsevier Interactive Patient Education  2018 [employer]  

## 2017-09-21 ENCOUNTER — Telehealth: Payer: Self-pay

## 2017-09-21 NOTE — Telephone Encounter (Signed)
Patient has been advised that he is scheduled for CT Chest on 10/14/17 @ 10:30 KP/[place]

## 2017-09-30 ENCOUNTER — Ambulatory Visit: Payer: Medicare Other | Attending: Pain Medicine | Admitting: Pain Medicine

## 2017-09-30 ENCOUNTER — Encounter: Payer: Self-pay | Admitting: Pain Medicine

## 2017-09-30 ENCOUNTER — Other Ambulatory Visit: Payer: Self-pay

## 2017-09-30 ENCOUNTER — Telehealth: Payer: Self-pay | Admitting: Pain Medicine

## 2017-09-30 VITALS — BP 127/80 | HR 92 | Temp 98.1°F | Ht 71.0 in | Wt 195.0 lb

## 2017-09-30 DIAGNOSIS — M797 Fibromyalgia: Secondary | ICD-10-CM | POA: Insufficient documentation

## 2017-09-30 DIAGNOSIS — R936 Abnormal findings on diagnostic imaging of limbs: Secondary | ICD-10-CM

## 2017-09-30 DIAGNOSIS — R51 Headache: Secondary | ICD-10-CM | POA: Diagnosis not present

## 2017-09-30 DIAGNOSIS — M542 Cervicalgia: Secondary | ICD-10-CM | POA: Insufficient documentation

## 2017-09-30 DIAGNOSIS — Z79891 Long term (current) use of opiate analgesic: Secondary | ICD-10-CM | POA: Diagnosis not present

## 2017-09-30 DIAGNOSIS — M5030 Other cervical disc degeneration, unspecified cervical region: Secondary | ICD-10-CM

## 2017-09-30 DIAGNOSIS — G8929 Other chronic pain: Secondary | ICD-10-CM

## 2017-09-30 DIAGNOSIS — Y842 Radiological procedure and radiotherapy as the cause of abnormal reaction of the patient, or of later complication, without mention of misadventure at the time of the procedure: Secondary | ICD-10-CM

## 2017-09-30 DIAGNOSIS — M899 Disorder of bone, unspecified: Secondary | ICD-10-CM | POA: Diagnosis not present

## 2017-09-30 DIAGNOSIS — Z79899 Other long term (current) drug therapy: Secondary | ICD-10-CM | POA: Diagnosis not present

## 2017-09-30 DIAGNOSIS — R700 Elevated erythrocyte sedimentation rate: Secondary | ICD-10-CM | POA: Diagnosis not present

## 2017-09-30 DIAGNOSIS — C3490 Malignant neoplasm of unspecified part of unspecified bronchus or lung: Secondary | ICD-10-CM | POA: Diagnosis not present

## 2017-09-30 DIAGNOSIS — G501 Atypical facial pain: Secondary | ICD-10-CM | POA: Diagnosis not present

## 2017-09-30 DIAGNOSIS — G894 Chronic pain syndrome: Secondary | ICD-10-CM | POA: Diagnosis not present

## 2017-09-30 DIAGNOSIS — R7982 Elevated C-reactive protein (CRP): Secondary | ICD-10-CM | POA: Diagnosis not present

## 2017-09-30 DIAGNOSIS — R519 Headache, unspecified: Secondary | ICD-10-CM

## 2017-09-30 DIAGNOSIS — R52 Pain, unspecified: Secondary | ICD-10-CM | POA: Diagnosis not present

## 2017-09-30 DIAGNOSIS — Z789 Other specified health status: Secondary | ICD-10-CM | POA: Diagnosis not present

## 2017-09-30 DIAGNOSIS — G893 Neoplasm related pain (acute) (chronic): Secondary | ICD-10-CM

## 2017-09-30 DIAGNOSIS — Z87891 Personal history of nicotine dependence: Secondary | ICD-10-CM | POA: Insufficient documentation

## 2017-09-30 DIAGNOSIS — M47812 Spondylosis without myelopathy or radiculopathy, cervical region: Secondary | ICD-10-CM

## 2017-09-30 DIAGNOSIS — M7581 Other shoulder lesions, right shoulder: Secondary | ICD-10-CM

## 2017-09-30 DIAGNOSIS — M19011 Primary osteoarthritis, right shoulder: Secondary | ICD-10-CM

## 2017-09-30 DIAGNOSIS — Z9889 Other specified postprocedural states: Secondary | ICD-10-CM | POA: Insufficient documentation

## 2017-09-30 DIAGNOSIS — R070 Pain in throat: Secondary | ICD-10-CM

## 2017-09-30 MED ORDER — OXYCODONE HCL 5 MG PO TABS: 5.0000 mg | ORAL_TABLET | Freq: Three times a day (TID) | ORAL | 0 refills | Status: DC | PRN

## 2017-09-30 MED ORDER — NONFORMULARY OR COMPOUNDED ITEM: 30.0000 mL | Freq: Four times a day (QID) | 0 refills | Status: DC | PRN

## 2017-09-30 NOTE — Patient Instructions (Addendum)
_____You have been given a prescription for oxycodone x 1 today and a script for a compound for throat pain.  You have been instructed to get xrays done in the medical mall and to get labwork done here in the clinic. _______________________________________________________________________________________  Medication Rules  Applies to: All patients receiving prescriptions (written or electronic).  Pharmacy of record: Pharmacy where electronic prescriptions will be sent. If written prescriptions are taken to a different pharmacy, please inform the nursing staff. The pharmacy listed in the electronic medical record should be the one where you would like electronic prescriptions to be sent.  Prescription refills: Only during scheduled appointments. Applies to both, written and electronic prescriptions.  NOTE: The following applies primarily to controlled substances (Opioid* Pain Medications).   Patient's responsibilities: 1. Pain Pills: Bring all pain pills to every appointment (except for procedure appointments). 2. Pill Bottles: Bring pills in original pharmacy bottle. Always bring newest bottle. Bring bottle, even if empty. 3. Medication refills: You are responsible for knowing and keeping track of what medications you need refilled. The day before your appointment, write a list of all prescriptions that need to be refilled. Bring that list to your appointment and give it to the admitting nurse. Prescriptions will be written only during appointments. If you forget a medication, it will not be "Called in", "Faxed", or "electronically sent". You will need to get another appointment to get these prescribed. 4. Prescription Accuracy: You are responsible for carefully inspecting your prescriptions before leaving our office. Have the discharge nurse carefully go over each prescription with you, before taking them home. Make sure that your name is accurately spelled, that your address is correct. Check the  name and dose of your medication to make sure it is accurate. Check the number of pills, and the written instructions to make sure they are clear and accurate. Make sure that you are given enough medication to last until your next medication refill appointment. 5. Taking Medication: Take medication as prescribed. Never take more pills than instructed. Never take medication more frequently than prescribed. Taking less pills or less frequently is permitted and encouraged, when it comes to controlled substances (written prescriptions).  6. Inform other Doctors: Always inform, all of your healthcare providers, of all the medications you take. 7. Pain Medication from other Providers: You are not allowed to accept any additional pain medication from any other Doctor or Healthcare provider. There are two exceptions to this rule. (see below) In the event that you require additional pain medication, you are responsible for notifying us, as stated below. 8. Medication Agreement: You are responsible for carefully reading and following our Medication Agreement. This must be signed before receiving any prescriptions from our practice. Safely store a copy of your signed Agreement. Violations to the Agreement will result in no further prescriptions. (Additional copies of our Medication Agreement are available upon request.) 9. Laws, Rules, & Regulations: All patients are expected to follow all Federal and [employer], [employer], Rules, [employer]. Ignorance of the Laws does not constitute a valid excuse. The use of any illegal substances is prohibited. 10. Adopted CDC guidelines & recommendations: Target dosing levels will be at or below 60 MME/day. Use of benzodiazepines** is not recommended.  Exceptions: There are only two exceptions to the rule of not receiving pain medications from other Healthcare Providers. 1. Exception #1 (Emergencies): In the event of an emergency (i.e.: accident requiring emergency care), you  are allowed to receive additional pain medication. However, you are responsible  for: As soon as you are able, call our office [phone], at any time of the day or night, and leave a message stating your name, the date and nature of the emergency, and the name and dose of the medication prescribed. In the event that your call is answered by a member of our staff, make sure to document and save the date, time, and the name of the person that took your information.  2. Exception #2 (Planned Surgery): In the event that you are scheduled by another doctor or dentist to have any type of surgery or procedure, you are allowed (for a period no longer than 30 days), to receive additional pain medication, for the acute post-op pain. However, in this case, you are responsible for picking up a copy of our "Post-op Pain Management for Surgeons" handout, and giving it to your surgeon or dentist. This document is available at our office, and does not require an appointment to obtain it. Simply go to our office during business hours (Monday-Thursday from 8:00 AM to 4:00 PM) (Friday 8:00 AM to 12:00 Noon) or if you have a scheduled appointment with [place], prior to your surgery, and ask for it by name. In addition, you will need to provide [place] with your name, name of your surgeon, type of surgery, and date of procedure or surgery.  *Opioid medications include: morphine, codeine, oxycodone, oxymorphone, hydrocodone, hydromorphone, meperidine, tramadol, tapentadol, buprenorphine, fentanyl, methadone. **Benzodiazepine medications include: diazepam (Valium), alprazolam (Xanax), clonazepam (Klonopine), lorazepam (Ativan), clorazepate (Tranxene), chlordiazepoxide (Librium), estazolam (Prosom), oxazepam (Serax), temazepam (Restoril), triazolam (Halcion) (Last updated:  09/10/2017) ____________________________________________________________________________________________  ____________________________________________________________________________________________  Medication Recommendations and Reminders  Applies to: All patients receiving prescriptions (written and/or electronic).  Medication Rules & Regulations: These rules and regulations exist for your safety and that of others. They are not flexible and neither are we. Dismissing or ignoring them will be considered "non-compliance" with medication therapy, resulting in complete and irreversible termination of such therapy. (See document titled "Medication Rules" for more details.) In all conscience, because of safety reasons, we cannot continue providing a therapy where the patient does not follow instructions.  Pharmacy of record:   Definition: This is the pharmacy where your electronic prescriptions will be sent.   We do not endorse any particular pharmacy.  You are not restricted in your choice of pharmacy.  The pharmacy listed in the electronic medical record should be the one where you want electronic prescriptions to be sent.  If you choose to change pharmacy, simply notify our nursing staff of your choice of new pharmacy.  Recommendations:  Keep all of your pain medications in a safe place, under lock and key, even if you live alone.   After you fill your prescription, take 1 week's worth of pills and put them away in a safe place. You should keep a separate, properly labeled bottle for this purpose. The remainder should be kept in the original bottle. Use this as your primary supply, until it runs out. Once it's gone, then you know that you have 1 week's worth of medicine, and it is time to come in for a prescription refill. If you do this correctly, it is unlikely that you will ever run out of medicine.  To make sure that the above recommendation works, it is very important that you make  sure your medication refill appointments are scheduled at least 1 week before you run out of medicine. To do this in an effective manner, make sure that you  do not leave the office without scheduling your next medication management appointment. Always ask the nursing staff to show you in your prescription , when your medication will be running out. Then arrange for the receptionist to get you a return appointment, at least 7 days before you run out of medicine. Do not wait until you have 1 or 2 pills left, to come in. This is very poor planning and does not take into consideration that we may need to cancel appointments due to bad weather, sickness, or emergencies affecting our staff.  Prescription refills and/or changes in medication(s):   Prescription refills, and/or changes in dose or medication, will be conducted only during scheduled medication management appointments. (Applies to both, written and electronic prescriptions.)  No refills on procedure days. No medication will be changed or started on procedure days. No changes, adjustments, and/or refills will be conducted on a procedure day. Doing so will interfere with the diagnostic portion of the procedure.  No phone refills. No medications will be "called into the pharmacy".  No Fax refills.  No weekend refills.  No Holliday refills.  No after hours refills.  Remember:  Business hours are:  Monday to Thursday 8:00 AM to 4:00 PM Provider's Schedule: Dionisio David, NP - Appointments are:  Medication management: Monday to Thursday 8:00 AM to 4:00 PM Milinda Pointer, MD - Appointments are:  Medication management: Monday and Wednesday 8:00 AM to 4:00 PM Procedure day: Tuesday and Thursday 7:30 AM to 4:00 PM Gillis Santa, MD - Appointments are:  Medication management: Tuesday and Thursday 8:00 AM to 4:00 PM Procedure day: Monday and Wednesday 7:30 AM to 4:00 PM (Last update:  09/10/2017) ____________________________________________________________________________________________

## 2017-09-30 NOTE — Telephone Encounter (Signed)
Pharmacy called regarding script sent for compound meds, Ins will not pay and patient is unable to pay, very expensive. Please advise is there something else he can prescribe for patient.

## 2017-09-30 NOTE — Progress Notes (Signed)
Patient's Name: Chris Wilkinson  MRN: [id number]  Referring Provider: Danelle Berry, NP  DOB: 06-Nov-1940  PCP: Danelle Berry, NP  DOS: 09/30/2017  Note by: Gaspar Cola, MD  Service setting: Ambulatory outpatient  Specialty: Interventional Pain Management  Location: [facility] (AMB) Pain Management Facility    Patient type: Established   Primary Reason(s) for Visit: Encounter for evaluation before starting new chronic pain management plan of care (Level of risk: moderate) CC: Oral Swelling  HPI  Chris Wilkinson is a 77 y.o. year old, male patient, who comes today for a follow-up evaluation to review the test results and decide on a treatment plan. He has Sepsis ([place]); CAP (community acquired pneumonia); COPD (chronic obstructive pulmonary disease) ([place]); Type 2 diabetes mellitus ([place]); CAD (coronary artery disease); HTN (hypertension); GERD (gastroesophageal reflux disease); HCAP (healthcare-associated pneumonia); Lung cancer ([place]); Pharmacologic therapy; Bloating; Carpal tunnel syndrome; Gas; Hx of adenomatous polyp of colon; Sprain of wrist; Encounter for antineoplastic immunotherapy; Chronic throat pain (Primary Area of Pain); Chronic secondary facial pain (Secondary Area of Pain) (Right); Chronic neck pain (Tertiary Area of Pain); Chronic pain syndrome; Other long term (current) drug therapy; Disorder of skeletal system; Other specified health status; Long term current use of opiate analgesic; Pneumothorax; Cervicalgia; Tendinitis of rotator cuff (Right); DJD of AC (acromioclavicular) joint (Right); Osteoarthritis of shoulder (Right); DDD (degenerative disc disease), cervical; Spondylosis without myelopathy or radiculopathy, cervical region; Atypical facial pain (Right); Problems influencing health status; Abnormal MRI, shoulder (08/20/2011) (Right); Cancer related pain; Pain after radiation therapy; Elevated sed rate; and Elevated C-reactive protein (CRP) on their problem list. His primarily concern  today is the Oral Swelling  Pain Assessment: Location: Left, Right, Mid Other (Comment)(throat) Radiating: Radiates to left jaw Onset: More than a month ago Duration: Chronic pain Quality: Stabbing, Discomfort, Crying Severity: 3 /10 (self-reported pain score)  Note: Reported level is compatible with observation.                         When using our objective Pain Scale, levels between 6 and 10/10 are said to belong in an emergency room, as it progressively worsens from a 6/10, described as severely limiting, requiring emergency care not usually available at an outpatient pain management facility. At a 6/10 level, communication becomes difficult and requires great effort. Assistance to reach the emergency department may be required. Facial flushing and profuse sweating along with potentially dangerous increases in heart rate and blood pressure will be evident. Timing: Intermittent Modifying factors: oxycodone  Chris Wilkinson comes in today for a follow-up visit after his initial evaluation on 08/12/2017. Today we went over the results of his tests. These were explained in "Layman's terms". During today's appointment we went over my diagnostic impression, as well as the proposed treatment plan.  According to the patient's primary area pain is in his throat. He admits that it does radiate up into his jaw greater on the right (R>L). 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 He admits that the pain was worse after the radiation.  He has right sided facial pain the goes up to his right eye. He denies numbness or tingling.   In considering the treatment plan options, Chris Wilkinson was reminded that I no longer take patients for medication management only. I asked him to let me know if he had no intention of taking advantage of the interventional therapies, so that we could make arrangements to provide this  space to someone interested. I also made it clear that undergoing interventional  therapies for the purpose of getting pain medications is very inappropriate on the part of a patient, and it will not be tolerated in this practice. This type of behavior would suggest true addiction and therefore it requires referral to an addiction specialist.   Further details on both, my assessment(s), as well as the proposed treatment plan, please see below.  Controlled Substance Pharmacotherapy Assessment REMS (Risk Evaluation and Mitigation Strategy)  Analgesic: Oxycodone IR 10 mg 1 tablet daily TID (30 mg/day of oxycodone) Highest recorded MME/day: 73m/day MME/day: 45 mg/day Pill Count: None expected due to no prior prescriptions written by our practice. No notes on file Pharmacokinetics: Liberation and absorption (onset of action): WNL Distribution (time to peak effect): WNL Metabolism and excretion (duration of action): WNL         Pharmacodynamics: Desired effects: Analgesia: Mr. FEricssonreports >50% benefit. Functional ability: Patient reports that medication allows him to accomplish basic ADLs Clinically meaningful improvement in function (CMIF): Sustained CMIF goals met Perceived effectiveness: Described as relatively effective, allowing for increase in activities of daily living (ADL) Undesirable effects: Side-effects or Adverse reactions: None reported Monitoring: [name] PMP: Online review of the past 122-montheriod previously conducted. Not applicable at this point since we have not taken over the patient's medication management yet. List of all UDS test(s) done:  Lab Results  Component Value Date   SUMMARY FINAL 08/12/2017   Last UDS on record: Summary  Date Value Ref Range Status  08/12/2017 FINAL  Final    Comment:    ==================================================================== TOXASSURE COMP DRUG ANALYSIS,UR ==================================================================== Test                             Result       Flag       Units Drug Present  and Declared for Prescription Verification   Metoprolol                     PRESENT      EXPECTED Drug Present not Declared for Prescription Verification   Primidone                      PRESENT      UNEXPECTED   Phenobarbital                  PRESENT      UNEXPECTED    Phenobarbital is an expected metabolite of primidone;    Phenobarbital may also be administered as a prescription drug. Drug Absent but Declared for Prescription Verification   Acetaminophen                  Not Detected UNEXPECTED    Acetaminophen, as indicated in the declared medication list, is    not always detected even when used as directed. ==================================================================== Test                      Result    Flag   Units      Ref Range   Creatinine              121              mg/dL      >=20 ==================================================================== Declared Medications:  The flagging and interpretation on this report are based on the  following declared medications.  Unexpected results may arise from  inaccuracies in the declared medications.  **Note: The testing scope of this panel includes these medications:  Metoprolol  **Note: The testing scope of this panel does not include small to  moderate amounts of these reported medications:  Acetaminophen  **Note: The testing scope of this panel does not include following  reported medications:  Amiodarone  Apixaban  Fluticasone  Furosemide  Metformin  Simvastatin  Vilanterol ==================================================================== For clinical consultation, please call [phone]. ====================================================================    UDS interpretation: No unexpected findings.          Medication Assessment Form: Patient introduced to form today Treatment compliance: Treatment may start today if patient agrees with proposed plan. Evaluation of compliance is not applicable at  this point Risk Assessment Profile: Aberrant behavior: See initial evaluations. None observed or detected today Comorbid factors increasing risk of overdose: See initial evaluation. No additional risks detected today Medical Psychology Evaluation: Please see scanned results in medical record. Opioid Risk Tool - 08/12/17 1325      Family History of Substance Abuse   Alcohol  Negative    Illegal Drugs  Negative    Rx Drugs  Negative      Personal History of Substance Abuse   Alcohol  Negative    Illegal Drugs  Negative    Rx Drugs  Negative      Age   Age between 66-45 years   No      History of Preadolescent Sexual Abuse   History of Preadolescent Sexual Abuse  Negative or Male      Psychological Disease   Psychological Disease  Negative    Depression  Negative      Total Score   Opioid Risk Tool Scoring  0    Opioid Risk Interpretation  Low Risk      ORT Scoring interpretation table:  Score <3 = Low Risk for SUD  Score between 4-7 = Moderate Risk for SUD  Score >8 = High Risk for Opioid Abuse   Risk Mitigation Strategies:  Patient opioid safety counseling: Completed today. Counseling provided to patient as per "Patient Counseling Document". Document signed by patient, attesting to counseling and understanding Patient-Prescriber Agreement (PPA): Obtained today.  Controlled substance notification to other providers: Written and sent today.  Pharmacologic Plan: Today we may be taking over the patient's pharmacological regimen. See below.             Laboratory Chemistry  Inflammation Markers (CRP: Acute Phase) (ESR: Chronic Phase) Lab Results  Component Value Date   CRP 8.1 (H) 08/12/2017   ESRSEDRATE 53 (H) 08/12/2017   LATICACIDVEN 1.5 09/22/2016                         Renal Function Markers Lab Results  Component Value Date   BUN 26 (H) 09/08/2017   CREATININE 1.38 (H) 09/08/2017   GFRAA 56 (L) 09/08/2017   GFRNONAA 48 (L) 09/08/2017  BUN/Cr ratio <10:1  would suggests renal damage.   Hepatic Function Markers Lab Results  Component Value Date   AST 27 09/07/2017   ALT 53 09/07/2017   ALBUMIN 3.3 (L) 09/07/2017   ALKPHOS 100 09/07/2017   LIPASE 17 06/08/2016                 Electrolytes Lab Results  Component Value Date   NA 141 09/08/2017   K 3.9 09/08/2017   CL 108 09/08/2017   CALCIUM 9.3 09/08/2017  MG 1.8 08/12/2017                        Neuropathy Markers Lab Results  Component Value Date   VITAMINB12 401 08/12/2017   HGBA1C 6.1 (H) 08/04/2015                 Bone Pathology Markers Lab Results  Component Value Date   25OHVITD1 8.0 (L) 08/12/2017   25OHVITD2 <1.0 08/12/2017   25OHVITD3 7.9 08/12/2017                         Coagulation Parameters Lab Results  Component Value Date   INR 1.26 11/07/2016   LABPROT 15.9 (H) 11/07/2016   APTT 32 11/07/2016   PLT 133 (L) 09/08/2017                 Cardiovascular Markers Lab Results  Component Value Date   BNP 130.0 (H) 04/21/2017   TROPONINI <0.03 09/07/2017   HGB 12.7 (L) 09/08/2017   HCT 39.1 (L) 09/08/2017                 Note: Lab results reviewed.  Recent Diagnostic Imaging Review  Shoulder Imaging: Shoulder-R DG:  Results for orders placed during the hospital encounter of 03/27/15  DG Shoulder Right   Narrative CLINICAL DATA:  Right shoulder pain for 2 weeks without trauma.  EXAM: RIGHT SHOULDER - 2+ VIEW  COMPARISON:  11/13/2011 MRI  FINDINGS: Degenerative irregularity of the acromioclavicular and glenohumeral joints. Calcific tendinitis involving the rotator cuff insertion.  IMPRESSION: No acute osseous abnormality.  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  Osteoarthritis.   Electronically Signed   By: Abigail Miyamoto M.D.   On: 03/27/2015 18:01    Complexity Note: Imaging results reviewed. Results shared with Mr. Mcneal, using Layman's terms.                         Meds   Current Outpatient Medications:   .  acetaminophen (TYLENOL) 325 MG tablet, Take 2 tablets (650 mg total) by mouth every 6 (six) hours as needed for mild pain (or Fever >/= 101)., Disp: , Rfl:  .  apixaban (ELIQUIS) 5 MG TABS tablet, Take 1 tablet (5 mg total) by mouth 2 (two) times daily., Disp: 60 tablet, Rfl: 2 .  metoprolol succinate (TOPROL-XL) 50 MG 24 hr tablet, Take 50 mg by mouth daily. Take with or immediately following a meal., Disp: , Rfl:  .  mometasone-formoterol (DULERA) 100-5 MCG/ACT AERO, Inhale 2 puffs into the lungs 2 times daily at 12 noon and 4 pm., Disp: 1 Inhaler, Rfl: 4 .  primidone (MYSOLINE) 50 MG tablet, Take 1 tablet by mouth daily., Disp: , Rfl:  .  simvastatin (ZOCOR) 10 MG tablet, Take 10 mg by mouth every evening. , Disp: , Rfl:  .  TRADJENTA 5 MG TABS tablet, Take 5 mg by mouth daily., Disp: , Rfl:  .  Vitamin D, Ergocalciferol, (DRISDOL) 50000 units CAPS capsule, Take 1 capsule by mouth every Thursday., Disp: , Rfl:  .  NONFORMULARY OR COMPOUNDED ITEM, Take 30 mLs by mouth every 6 (six) hours as needed for up to 2 doses. Mix Maalox + 2% Viscous Lidocaine + Donnatal in equal amounts. Take 30 ml PO q6hrs. Do not exceed 3 doses per day., Disp: 1 each, Rfl: 0 .  oxyCODONE (OXY IR/ROXICODONE) 5 MG immediate release tablet,  Take 1 tablet (5 mg total) by mouth every 8 (eight) hours as needed for severe pain., Disp: 90 tablet, Rfl: 0 No current facility-administered medications for this visit.   Facility-Administered Medications Ordered in Other Visits:  .  sodium chloride flush (NS) 0.9 % injection 10 mL, 10 mL, Intravenous, PRN, Sindy Guadeloupe, MD, 10 mL at 01/30/17 0958  ROS  Constitutional: Denies any fever or chills Gastrointestinal: No reported hemesis, hematochezia, vomiting, or acute GI distress Musculoskeletal: Denies any acute onset joint swelling, redness, loss of ROM, or weakness Neurological: No reported episodes of acute onset apraxia, aphasia, dysarthria, agnosia, amnesia, paralysis, loss  of coordination, or loss of consciousness  Allergies  Mr. Vanderburg has No Known Allergies.  [place]  Drug: Mr. Fjeld  reports that he does not use drugs. Alcohol:  reports that he drinks about 1.2 oz of alcohol per week. Tobacco:  reports that he quit smoking about 13 years ago. he has never used smokeless tobacco. Medical:  has a past medical history of Cancer ([place]), CHF (congestive heart failure) ([place]), COPD (chronic obstructive pulmonary disease) ([place]), Coronary artery disease, Diabetes mellitus without complication ([place]), Fibromyalgia, GERD (gastroesophageal reflux disease), Hypertension, and Pulmonary emboli ([place]). Surgical: Mr. Verdejo  has a past surgical history that includes Tonsillectomy; Hernia repair; Eye surgery; Repair knee ligament; Colonoscopy with propofol (N/A, 01/18/2015); Esophagogastroduodenoscopy (N/A, 01/18/2015); Flexible bronchoscopy (N/A, 10/22/2016); and IR FLUORO GUIDE PORT INSERTION LEFT (11/07/2016). Family: family history includes Diabetes in his unknown relative; Hypertension in his unknown relative; Prostate cancer in his brother and father.  Constitutional Exam  General appearance: Well nourished, well developed, and well hydrated. In no apparent acute distress Vitals:   09/30/17 1028  BP: 127/80  Pulse: 92  Temp: 98.1 F (36.7 C)  SpO2: 99%  Weight: 195 lb (88.5 kg)  Height: _0  (1.803 m)   BMI Assessment: Estimated body mass index is 27.2 kg/m as calculated from the following:   Height as of this encounter: _1  (1.803 m).   Weight as of this encounter: 195 lb (88.5 kg).  BMI interpretation table: BMI level Category Range association with higher incidence of chronic pain  <18 kg/m2 Underweight   18.5-24.9 kg/m2 Ideal body weight   25-29.9 kg/m2 Overweight Increased incidence by 20%  30-34.9 kg/m2 Obese (Class I) Increased incidence by 68%  35-39.9 kg/m2 Severe obesity (Class II) Increased incidence by 136%  >40 kg/m2 Extreme obesity (Class III)  Increased incidence by 254%   BMI Readings from Last 4 Encounters:  09/30/17 27.20 kg/m  09/17/17 28.73 kg/m  09/07/17 26.97 kg/m  09/02/17 27.20 kg/m   Wt Readings from Last 4 Encounters:  09/30/17 195 lb (88.5 kg)  09/17/17 206 lb (93.4 kg)  09/07/17 193 lb 6.4 oz (87.7 kg)  09/02/17 195 lb (88.5 kg)  Psych/Mental status: Alert, oriented x 3 (person, place, & time)       Eyes: PERLA Respiratory: No evidence of acute respiratory distress  Cervical Spine Area Exam  Skin & Axial Inspection: No masses, redness, edema, swelling, or associated skin lesions Alignment: Symmetrical Functional ROM: Decreased ROM      Stability: No instability detected Muscle Tone/Strength: Functionally intact. No obvious neuro-muscular anomalies detected. Sensory (Neurological): Movement-associated pain Palpation: Complains of area being tender to palpation              Upper Extremity (UE) Exam    Side: Right upper extremity  Side: Left upper extremity  Skin & Extremity Inspection: Skin color, temperature, and  hair growth are WNL. No peripheral edema or cyanosis. No masses, redness, swelling, asymmetry, or associated skin lesions. No contractures.  Skin & Extremity Inspection: Skin color, temperature, and hair growth are WNL. No peripheral edema or cyanosis. No masses, redness, swelling, asymmetry, or associated skin lesions. No contractures.  Functional ROM: Unrestricted ROM          Functional ROM: Unrestricted ROM          Muscle Tone/Strength: Functionally intact. No obvious neuro-muscular anomalies detected.  Muscle Tone/Strength: Functionally intact. No obvious neuro-muscular anomalies detected.  Sensory (Neurological): Unimpaired          Sensory (Neurological): Unimpaired          Palpation: No palpable anomalies              Palpation: No palpable anomalies              Specialized Test(s): Deferred         Specialized Test(s): Deferred          Thoracic Spine Area Exam  Skin & Axial  Inspection: No masses, redness, or swelling Alignment: Symmetrical Functional ROM: Unrestricted ROM Stability: No instability detected Muscle Tone/Strength: Functionally intact. No obvious neuro-muscular anomalies detected. Sensory (Neurological): Unimpaired Muscle strength & Tone: No palpable anomalies  Lumbar Spine Area Exam  Skin & Axial Inspection: No masses, redness, or swelling Alignment: Symmetrical Functional ROM: Unrestricted ROM      Stability: No instability detected Muscle Tone/Strength: Functionally intact. No obvious neuro-muscular anomalies detected. Sensory (Neurological): Unimpaired Palpation: No palpable anomalies       Provocative Tests: Lumbar Hyperextension and rotation test: evaluation deferred today       Lumbar Lateral bending test: evaluation deferred today       Patrick's Maneuver: evaluation deferred today                    Gait & Posture Assessment  Ambulation: Unassisted Gait: Relatively normal for age and body habitus Posture: WNL   Lower Extremity Exam    Side: Right lower extremity  Side: Left lower extremity  Skin & Extremity Inspection: Skin color, temperature, and hair growth are WNL. No peripheral edema or cyanosis. No masses, redness, swelling, asymmetry, or associated skin lesions. No contractures.  Skin & Extremity Inspection: Skin color, temperature, and hair growth are WNL. No peripheral edema or cyanosis. No masses, redness, swelling, asymmetry, or associated skin lesions. No contractures.  Functional ROM: Unrestricted ROM          Functional ROM: Unrestricted ROM          Muscle Tone/Strength: Functionally intact. No obvious neuro-muscular anomalies detected.  Muscle Tone/Strength: Functionally intact. No obvious neuro-muscular anomalies detected.  Sensory (Neurological): Unimpaired  Sensory (Neurological): Unimpaired  Palpation: No palpable anomalies  Palpation: No palpable anomalies   Assessment & Plan  Primary Diagnosis & Pertinent  Problem List: The primary encounter diagnosis was Chronic pain syndrome. Diagnoses of Cancer related pain, Pain after radiation therapy, Chronic throat pain (Primary Area of Pain), Chronic secondary facial pain (Secondary Area of Pain) (Right), Atypical facial pain (Right), Chronic neck pain (Tertiary Area of Pain), Cervicalgia, DDD (degenerative disc disease), cervical, DJD of AC (acromioclavicular) joint (Right), Osteoarthritis of shoulder (Right), Spondylosis without myelopathy or radiculopathy, cervical region, Tendinitis of rotator cuff (Right), Problems influencing health status, Disorder of skeletal system, Pharmacologic therapy, Abnormal MRI scan, arm (08/20/2011), Elevated sed rate, and Elevated C-reactive protein (CRP) were also pertinent to this visit.  Visit Diagnosis: 1.  Chronic pain syndrome   2. Cancer related pain   3. Pain after radiation therapy   4. Chronic throat pain (Primary Area of Pain)   5. Chronic secondary facial pain (Secondary Area of Pain) (Right)   6. Atypical facial pain (Right)   7. Chronic neck pain (Tertiary Area of Pain)   8. Cervicalgia   9. DDD (degenerative disc disease), cervical   10. DJD of AC (acromioclavicular) joint (Right)   11. Osteoarthritis of shoulder (Right)   12. Spondylosis without myelopathy or radiculopathy, cervical region   13. Tendinitis of rotator cuff (Right)   14. Problems influencing health status   15. Disorder of skeletal system   16. Pharmacologic therapy   17. Abnormal MRI scan, arm (08/20/2011)   18. Elevated sed rate   19. Elevated C-reactive protein (CRP)    Problems updated and reviewed during this visit: Problem  Cervicalgia  Tendinitis of rotator cuff (Right)  DJD of AC (acromioclavicular) joint (Right)  Osteoarthritis of shoulder (Right)  Ddd (Degenerative Disc Disease), Cervical  Spondylosis Without Myelopathy Or Radiculopathy, Cervical Region  Atypical facial pain (Right)  Abnormal MRI, shoulder (08/20/2011) (Right)    IMPRESSION: 1.Insertional tear of the supraspinatus tendon.Mild infraspinatus tendinosis.Moderate to severe tendinosis at the superior aspect of subscapularis. 2.Suspect mild tendinosis of the intra-articular portion of the long head of the biceps tendon. 3.Anteroinferior and inferior labral tears as above with inferior paralabral cyst.Suspect posterosuperior labral tear. 4.Moderate osteoarthrosis of the AC joint.  5.Subdeltoid bursitis and long head of biceps tenosynovitis.   Cancer Related Pain  Pain After Radiation Therapy  Chronic throat pain (Primary Area of Pain)  Chronic secondary facial pain (Secondary Area of Pain) (Right)  Chronic neck pain (Tertiary Area of Pain)  Chronic Pain Syndrome  Carpal Tunnel Syndrome  Sprain of Wrist  Lung Cancer ([facility])  Hx of Adenomatous Polyp of Colon  Problems Influencing Health Status  Elevated Sed Rate  Elevated C-Reactive Protein (Crp)  Other Long Term (Current) Drug Therapy  Disorder of Skeletal System  Other Specified Health Status  Long Term Current Use of Opiate Analgesic  Encounter for Antineoplastic Immunotherapy  Pharmacologic Therapy  Pneumothorax  Hcap (Healthcare-Associated Pneumonia)  Sepsis ([facility])  Cap (Community Acquired Pneumonia)  Copd (Chronic Obstructive Pulmonary Disease) ([facility])  Type 2 Diabetes Mellitus ([facility])  Cad (Coronary Artery Disease)  Htn (Hypertension)  Gerd (Gastroesophageal Reflux Disease)  Bloating  Gas   Time Note: Greater than 50% of the 40 minute(s) of face-to-face time spent with Mr. Mittelstaedt, was spent in counseling/coordination of care regarding: the appropriate use of the pain scale, Mr. Sermersheim primary cause of pain, the results of his recent test(s), the significance of each one oth the test(s) anomalies and it's corresponding characteristic pain pattern(s), the treatment plan, treatment alternatives, medication side effects, the opioid analgesic risks and possible complications, the  medication agreement and the need to collect and read the AVS material.  Plan of Care  Pharmacotherapy (Medications Ordered): Meds ordered this encounter  Medications  . NONFORMULARY OR COMPOUNDED ITEM    Sig: Take 30 mLs by mouth every 6 (six) hours as needed for up to 2 doses. Mix Maalox + 2% Viscous Lidocaine + Donnatal in equal amounts. Take 30 ml PO q6hrs. Do not exceed 3 doses per day.    Dispense:  1 each    Refill:  0    Please provide 100 mL of compounded item.  Marland Kitchen oxyCODONE (OXY IR/ROXICODONE) 5 MG immediate release tablet    Sig:  Take 1 tablet (5 mg total) by mouth every 8 (eight) hours as needed for severe pain.    Dispense:  90 tablet    Refill:  0    Do not place this medication, or any other prescription from our practice, on "Automatic Refill". Patient may have prescription filled one day early if pharmacy is closed on scheduled refill date. Do not fill until: 09/30/17 To last until: 10/30/17   Procedure Orders    No procedure(s) ordered today    Lab Orders     Rheumatoid factor     ANA w/Reflex if Positive  Imaging Orders     DG Cervical Spine With Flex & Extend  Referral Orders     Ambulatory referral to ENT  Pharmacological management options:  Opioid Analgesics: We'll take over management today. See above orders Membrane stabilizer: We have discussed the possibility of optimizing this mode of therapy, if tolerated Muscle relaxant: We have discussed the possibility of a trial NSAID: We have discussed the possibility of a trial Other analgesic(s): To be determined at a later time   Interventional management options: Planned, scheduled, and/or pending:    Note: Anticoagulation with Eliquis (stopped for 3 days prior to procedures) None at this time.   Considering:   Diagnostic CESI  Diagnostic cervical facet nerve block  Possible cervical facet RFA    PRN Procedures:   None at this time   Provider-requested follow-up: Return in about 2 weeks (around  10/14/2017) for F/U eval (after test completion).  Future Appointments  Date Time Provider [place]  10/09/2017  2:00 PM [facility] None  10/12/2017 10:00 AM [facility]  10/14/2017 10:30 AM [facility]  10/15/2017  2:30 PM Sindy Guadeloupe, MD [facility] None  10/26/2017 11:15 AM Allyne Gee, MD [facility] None  12/01/2017  1:00 PM [facility] None  12/31/2017 11:00 AM Noreene Filbert, MD [facility] None    Primary Care Physician: Danelle Berry, NP Location: [facility] Facility Note by: Gaspar Cola, MD Date: 09/30/2017; Time: 11:41 AM

## 2017-10-01 ENCOUNTER — Other Ambulatory Visit: Payer: Self-pay | Admitting: Pain Medicine

## 2017-10-01 LAB — RHEUMATOID FACTOR: Rhuematoid fact SerPl-aCnc: 14.7 IU/mL — ABNORMAL HIGH (ref 0.0–13.9)

## 2017-10-01 LAB — ANA W/REFLEX IF POSITIVE: Anti Nuclear Antibody(ANA): NEGATIVE

## 2017-10-01 NOTE — Telephone Encounter (Signed)
Spoke with pharmacist. The Donnatal in the elixir is too expensive. They suggested getting it without the Donnatal. Spoke with Dr. Dossie Arbour, he agreed to do this.

## 2017-10-01 NOTE — Telephone Encounter (Signed)
I do not have any suggestions. Dr Delane Ginger. Prescribed this.  Does pharmacy have any idea what they will cover. I am not sure why this is so expensive. Something similar

## 2017-10-09 ENCOUNTER — Encounter: Payer: Self-pay | Admitting: Nurse Practitioner

## 2017-10-09 ENCOUNTER — Ambulatory Visit: Payer: Medicare Other | Admitting: Nurse Practitioner

## 2017-10-09 ENCOUNTER — Telehealth: Payer: Self-pay | Admitting: *Deleted

## 2017-10-09 ENCOUNTER — Inpatient Hospital Stay: Payer: Medicare Other

## 2017-10-09 VITALS — BP 133/79 | HR 117 | Resp 16 | Ht 71.0 in | Wt 200.0 lb

## 2017-10-09 DIAGNOSIS — R059 Cough, unspecified: Secondary | ICD-10-CM

## 2017-10-09 DIAGNOSIS — R0602 Shortness of breath: Secondary | ICD-10-CM | POA: Diagnosis not present

## 2017-10-09 DIAGNOSIS — J440 Chronic obstructive pulmonary disease with acute lower respiratory infection: Secondary | ICD-10-CM | POA: Diagnosis not present

## 2017-10-09 DIAGNOSIS — R05 Cough: Secondary | ICD-10-CM | POA: Diagnosis not present

## 2017-10-09 DIAGNOSIS — J209 Acute bronchitis, unspecified: Secondary | ICD-10-CM | POA: Diagnosis not present

## 2017-10-09 MED ORDER — METHYLPREDNISOLONE 4 MG PO TBPK: ORAL_TABLET | ORAL | 0 refills | Status: DC

## 2017-10-09 MED ORDER — AMOXICILLIN-POT CLAVULANATE 875-125 MG PO TABS
1.0000 | ORAL_TABLET | Freq: Two times a day (BID) | ORAL | 0 refills | Status: DC
Start: 2017-10-09 — End: 2017-11-16

## 2017-10-09 MED ORDER — IPRATROPIUM-ALBUTEROL 0.5-2.5 (3) MG/3ML IN SOLN: 3.0000 mL | Freq: Four times a day (QID) | RESPIRATORY_TRACT | 3 refills | Status: DC | PRN

## 2017-10-09 NOTE — Telephone Encounter (Signed)
Pt came by today to get port labs done. He said that he has a scan mon and then another one wed.  We combined all scans for 3/1 and I called patient and asked about prep kit. He states every time he has scan he comes 1 hour early and drinks scan at hospital before he has scan.

## 2017-10-09 NOTE — Progress Notes (Signed)
[facility], [place] [address]  Internal MEDICINE  Office Visit Note  Patient Name: Chris Wilkinson  [id number]  [id number]  Date of Service: 11/04/2017   Pt is here for a sick visit.  Chief Complaint  Patient presents with  . Shortness of Breath    shortness of breath. oxygen machine doesnt seem to be working     Shortness of Breath  This is a new problem. The current episode started in the past 7 days. The problem occurs constantly. The problem has been gradually worsening. Associated symptoms include headaches, orthopnea, rhinorrhea, a sore throat, swollen glands and wheezing. Pertinent negatives include no abdominal pain, chest pain, fever, neck pain, rash or vomiting. The symptoms are aggravated by exercise and weather changes. He has tried ipratropium inhalers, cool air and beta agonist inhalers for the symptoms. The treatment provided mild relief. His past medical history is significant for allergies, CAD and chronic lung disease.        Current Medication:  Outpatient Encounter Medications as of 10/09/2017  Medication Sig Note  . acetaminophen (TYLENOL) 325 MG tablet Take 2 tablets (650 mg total) by mouth every 6 (six) hours as needed for mild pain (or Fever >/= 101).   Marland Kitchen apixaban (ELIQUIS) 5 MG TABS tablet Take 1 tablet (5 mg total) by mouth 2 (two) times daily.   Marland Kitchen doxycycline (DORYX) 100 MG EC tablet Take 100 mg by mouth 2 (two) times daily.   . mometasone-formoterol (DULERA) 100-5 MCG/ACT AERO Inhale 2 puffs into the lungs 2 times daily at 12 noon and 4 pm.   . primidone (MYSOLINE) 50 MG tablet Take 1 tablet by mouth daily.   . simvastatin (ZOCOR) 10 MG tablet Take 10 mg by mouth every evening.    . Vitamin D, Ergocalciferol, (DRISDOL) 50000 units CAPS capsule Take 1 capsule by mouth every Thursday.   . [DISCONTINUED] oxyCODONE (OXY IR/ROXICODONE) 5 MG immediate release tablet Take 1 tablet (5 mg total) by mouth every 8 (eight) hours  as needed for severe pain. (Patient taking differently: Take 10 mg by mouth every 8 (eight) hours as needed for severe pain. )   . amoxicillin-clavulanate (AUGMENTIN) 875-125 MG tablet Take 1 tablet by mouth 2 (two) times daily. (Patient not taking: Reported on 10/26/2017) 10/10/2017: Pt has not started  . ipratropium-albuterol (DUONEB) 0.5-2.5 (3) MG/3ML SOLN Take 3 mLs by nebulization every 6 (six) hours as needed.   . methylPREDNISolone (MEDROL) 4 MG TBPK tablet Take by mouth as directed for 6 days (Patient not taking: Reported on 10/26/2017) 10/10/2017: Has not started  . metoprolol succinate (TOPROL-XL) 50 MG 24 hr tablet Take 50 mg by mouth daily. Take with or immediately following a meal.   . NONFORMULARY OR COMPOUNDED ITEM Take 30 mLs by mouth every 6 (six) hours as needed for up to 2 doses. Mix Maalox + 2% Viscous Lidocaine + Donnatal in equal amounts. Take 30 ml PO q6hrs. Do not exceed 3 doses per day.   . TRADJENTA 5 MG TABS tablet Take 5 mg by mouth daily.   . [DISCONTINUED] prochlorperazine (COMPAZINE) 10 MG tablet Take 1 tablet (10 mg total) by mouth every 6 (six) hours as needed (Nausea or vomiting). (Patient not taking: Reported on 12/04/2016)    Facility-Administered Encounter Medications as of 10/09/2017  Medication  . sodium chloride flush (NS) 0.9 % injection 10 mL      Medical History: Past Medical History:  Diagnosis Date  . Cancer ([place])  Lung CA  . CHF (congestive heart failure) ([place])   . COPD (chronic obstructive pulmonary disease) ([place])   . Coronary artery disease   . Diabetes mellitus without complication ([place])   . Fibromyalgia   . GERD (gastroesophageal reflux disease)   . Hypertension   . Pulmonary emboli ([place])      Today's Vitals   10/09/17 1134  BP: 133/79  Pulse: (!) 117  Resp: 16  SpO2: 94%  Weight: 200 lb (90.7 kg)  Height: 5\' 11"  (1.803 m)    Review of Systems  Constitutional: Positive for fatigue. Negative for chills, fever and unexpected  weight change.  HENT: Positive for postnasal drip, rhinorrhea and sore throat. Negative for congestion and sneezing.   Eyes: Negative.  Negative for redness.  Respiratory: Positive for cough, shortness of breath and wheezing. Negative for chest tightness.   Cardiovascular: Positive for orthopnea. Negative for chest pain and palpitations.  Gastrointestinal: Negative for abdominal pain, constipation, diarrhea, nausea and vomiting.  Endocrine: Negative for cold intolerance, heat intolerance, polydipsia, polyphagia and polyuria.  Genitourinary: Negative for dysuria, frequency and urgency.  Musculoskeletal: Positive for myalgias. Negative for arthralgias, back pain, joint swelling and neck pain.  Skin: Negative for rash.  Allergic/Immunologic: Positive for environmental allergies.  Neurological: Positive for headaches. Negative for tremors and numbness.  Hematological: Negative for adenopathy. Does not bruise/bleed easily.  Psychiatric/Behavioral: Negative for behavioral problems (Depression), sleep disturbance and suicidal ideas. The patient is not nervous/anxious.     Physical Exam  Constitutional: He is oriented to person, place, and time. He appears well-developed and well-nourished. No distress.  HENT:  Head: Normocephalic and atraumatic.  Mouth/Throat: Oropharynx is clear and moist. No oropharyngeal exudate.  Eyes: Pupils are equal, round, and reactive to light. EOM are normal.  Neck: Normal range of motion. Neck supple. No JVD present. No tracheal deviation present. No thyromegaly present.  Cardiovascular: Normal rate, regular rhythm and normal heart sounds. Exam reveals no gallop and no friction rub.  No murmur heard. Pulmonary/Chest: Effort normal. No respiratory distress. He has wheezes. He has no rales. He exhibits no tenderness.  Congested, non-productive cough.  Abdominal: Soft. Bowel sounds are normal. There is no tenderness.  Musculoskeletal: Normal range of motion.   Lymphadenopathy:    He has cervical adenopathy.  Neurological: He is alert and oriented to person, place, and time. No cranial nerve deficit.  Skin: Skin is warm and dry. He is not diaphoretic.  Psychiatric: He has a normal mood and affect. His behavior is normal. Judgment and thought content normal.  Nursing note and vitals reviewed.  Assessment/Plan: 1. Acute bronchitis with COPD ([place]) - amoxicillin-clavulanate (AUGMENTIN) 875-125 MG tablet; Take 1 tablet by mouth 2 (two) times daily. (Patient not taking: Reported on 10/26/2017)  Dispense: 20 tablet; Refill: 0 Use inhalers and nebulizer treatments as needed and as prescribed.   2. SOB (shortness of breath) - ipratropium-albuterol (DUONEB) 0.5-2.5 (3) MG/3ML SOLN; Take 3 mLs by nebulization every 6 (six) hours as needed.  Dispense: 120 mL; Refill: 3 - methylPREDNISolone (MEDROL) 4 MG TBPK tablet; Take by mouth as directed for 6 days (Patient not taking: Reported on 10/26/2017)  Dispense: 21 tablet; Refill: 0  3. Cough - ipratropium-albuterol (DUONEB) 0.5-2.5 (3) MG/3ML SOLN; Take 3 mLs by nebulization every 6 (six) hours as needed.  Dispense: 120 mL; Refill: 3   General Counseling: Keelen verbalizes understanding of the findings of todays visit and agrees with plan of treatment. I have discussed any further diagnostic evaluation that  may be needed or ordered today. We also reviewed his medications today. he has been encouraged to call the office with any questions or concerns that should arise related to todays visit.  Rest and increase fluids. Continue using OTC medication to control symptoms.   This patient was seen by Leretha Pol, FNP- C in Collaboration with Dr Lavera Guise as a part of collaborative care agreement  Meds ordered this encounter  Medications  . amoxicillin-clavulanate (AUGMENTIN) 875-125 MG tablet    Sig: Take 1 tablet by mouth 2 (two) times daily.    Dispense:  20 tablet    Refill:  0    Order Specific  Question:   Supervising Provider    Answer:   Lavera Guise [[id number]]  . ipratropium-albuterol (DUONEB) 0.5-2.5 (3) MG/3ML SOLN    Sig: Take 3 mLs by nebulization every 6 (six) hours as needed.    Dispense:  120 mL    Refill:  3    Order Specific Question:   Supervising Provider    Answer:   Lavera Guise [[id number]]  . methylPREDNISolone (MEDROL) 4 MG TBPK tablet    Sig: Take by mouth as directed for 6 days    Dispense:  21 tablet    Refill:  0    Order Specific Question:   Supervising Provider    Answer:   Lavera Guise [[id number]]    Time spent: 20 Minutes

## 2017-10-10 ENCOUNTER — Emergency Department: Payer: Medicare Other

## 2017-10-10 ENCOUNTER — Other Ambulatory Visit: Payer: Self-pay

## 2017-10-10 ENCOUNTER — Inpatient Hospital Stay
Admission: EM | Admit: 2017-10-10 | Discharge: 2017-10-12 | DRG: 190 | Disposition: A | Discharge status: Home / Self Care | Payer: Medicare Other | Attending: Internal Medicine | Admitting: Internal Medicine

## 2017-10-10 DIAGNOSIS — M797 Fibromyalgia: Secondary | ICD-10-CM | POA: Diagnosis present

## 2017-10-10 DIAGNOSIS — Z85118 Personal history of other malignant neoplasm of bronchus and lung: Secondary | ICD-10-CM | POA: Diagnosis not present

## 2017-10-10 DIAGNOSIS — T380X5A Adverse effect of glucocorticoids and synthetic analogues, initial encounter: Secondary | ICD-10-CM | POA: Diagnosis present

## 2017-10-10 DIAGNOSIS — G893 Neoplasm related pain (acute) (chronic): Secondary | ICD-10-CM

## 2017-10-10 DIAGNOSIS — I2510 Atherosclerotic heart disease of native coronary artery without angina pectoris: Secondary | ICD-10-CM | POA: Diagnosis present

## 2017-10-10 DIAGNOSIS — E1122 Type 2 diabetes mellitus with diabetic chronic kidney disease: Secondary | ICD-10-CM | POA: Diagnosis present

## 2017-10-10 DIAGNOSIS — Z7951 Long term (current) use of inhaled steroids: Secondary | ICD-10-CM

## 2017-10-10 DIAGNOSIS — G894 Chronic pain syndrome: Secondary | ICD-10-CM | POA: Diagnosis present

## 2017-10-10 DIAGNOSIS — N183 Chronic kidney disease, stage 3 (moderate): Secondary | ICD-10-CM | POA: Diagnosis present

## 2017-10-10 DIAGNOSIS — Z9221 Personal history of antineoplastic chemotherapy: Secondary | ICD-10-CM | POA: Diagnosis not present

## 2017-10-10 DIAGNOSIS — I509 Heart failure, unspecified: Secondary | ICD-10-CM | POA: Diagnosis present

## 2017-10-10 DIAGNOSIS — Z7984 Long term (current) use of oral hypoglycemic drugs: Secondary | ICD-10-CM

## 2017-10-10 DIAGNOSIS — J439 Emphysema, unspecified: Secondary | ICD-10-CM | POA: Diagnosis present

## 2017-10-10 DIAGNOSIS — Z923 Personal history of irradiation: Secondary | ICD-10-CM | POA: Diagnosis not present

## 2017-10-10 DIAGNOSIS — I130 Hypertensive heart and chronic kidney disease with heart failure and stage 1 through stage 4 chronic kidney disease, or unspecified chronic kidney disease: Secondary | ICD-10-CM | POA: Diagnosis present

## 2017-10-10 DIAGNOSIS — Z66 Do not resuscitate: Secondary | ICD-10-CM | POA: Diagnosis not present

## 2017-10-10 DIAGNOSIS — Z87891 Personal history of nicotine dependence: Secondary | ICD-10-CM

## 2017-10-10 DIAGNOSIS — E1165 Type 2 diabetes mellitus with hyperglycemia: Secondary | ICD-10-CM | POA: Diagnosis present

## 2017-10-10 DIAGNOSIS — J441 Chronic obstructive pulmonary disease with (acute) exacerbation: Secondary | ICD-10-CM | POA: Diagnosis present

## 2017-10-10 DIAGNOSIS — Z7901 Long term (current) use of anticoagulants: Secondary | ICD-10-CM

## 2017-10-10 DIAGNOSIS — R0602 Shortness of breath: Secondary | ICD-10-CM | POA: Diagnosis present

## 2017-10-10 DIAGNOSIS — Z833 Family history of diabetes mellitus: Secondary | ICD-10-CM

## 2017-10-10 DIAGNOSIS — K219 Gastro-esophageal reflux disease without esophagitis: Secondary | ICD-10-CM | POA: Diagnosis present

## 2017-10-10 DIAGNOSIS — Z79891 Long term (current) use of opiate analgesic: Secondary | ICD-10-CM | POA: Diagnosis not present

## 2017-10-10 DIAGNOSIS — Z86711 Personal history of pulmonary embolism: Secondary | ICD-10-CM

## 2017-10-10 DIAGNOSIS — J9621 Acute and chronic respiratory failure with hypoxia: Secondary | ICD-10-CM | POA: Diagnosis present

## 2017-10-10 LAB — CBC WITH DIFFERENTIAL/PLATELET
Basophils Absolute: 0 10*3/uL (ref 0–0.1)
Basophils Relative: 0 %
Eosinophils Absolute: 0.1 10*3/uL (ref 0–0.7)
Eosinophils Relative: 1 %
HEMATOCRIT: 37.9 % — AB (ref 40.0–52.0)
Hemoglobin: 12.4 g/dL — ABNORMAL LOW (ref 13.0–18.0)
LYMPHS ABS: 0.6 10*3/uL — AB (ref 1.0–3.6)
LYMPHS PCT: 8 %
MCH: 29.2 pg (ref 26.0–34.0)
MCHC: 32.8 g/dL (ref 32.0–36.0)
MCV: 89 fL (ref 80.0–100.0)
MONOS PCT: 15 %
Monocytes Absolute: 1.3 10*3/uL — ABNORMAL HIGH (ref 0.2–1.0)
NEUTROS PCT: 76 %
Neutro Abs: 6.3 10*3/uL (ref 1.4–6.5)
Platelets: 159 10*3/uL (ref 150–440)
RBC: 4.26 MIL/uL — AB (ref 4.40–5.90)
RDW: 16.1 % — ABNORMAL HIGH (ref 11.5–14.5)
WBC: 8.3 10*3/uL (ref 3.8–10.6)

## 2017-10-10 LAB — COMPREHENSIVE METABOLIC PANEL
ALT: 19 U/L (ref 17–63)
AST: 22 U/L (ref 15–41)
Albumin: 3.2 g/dL — ABNORMAL LOW (ref 3.5–5.0)
Alkaline Phosphatase: 92 U/L (ref 38–126)
Anion gap: 9 (ref 5–15)
BILIRUBIN TOTAL: 0.5 mg/dL (ref 0.3–1.2)
BUN: 13 mg/dL (ref 6–20)
CALCIUM: 9.7 mg/dL (ref 8.9–10.3)
CO2: 26 mmol/L (ref 22–32)
CREATININE: 1.58 mg/dL — AB (ref 0.61–1.24)
Chloride: 103 mmol/L (ref 101–111)
GFR calc non Af Amer: 41 mL/min — ABNORMAL LOW (ref >60)
GFR, EST AFRICAN AMERICAN: 48 mL/min — AB (ref >60)
Glucose, Bld: 161 mg/dL — ABNORMAL HIGH (ref 65–99)
Potassium: 3.7 mmol/L (ref 3.5–5.1)
Sodium: 138 mmol/L (ref 135–145)
TOTAL PROTEIN: 8.5 g/dL — AB (ref 6.5–8.1)

## 2017-10-10 LAB — BASIC METABOLIC PANEL
Anion gap: 11 (ref 5–15)
BUN: 14 mg/dL (ref 6–20)
CALCIUM: 9.6 mg/dL (ref 8.9–10.3)
CO2: 23 mmol/L (ref 22–32)
Chloride: 105 mmol/L (ref 101–111)
Creatinine, Ser: 1.46 mg/dL — ABNORMAL HIGH (ref 0.61–1.24)
GFR calc Af Amer: 52 mL/min — ABNORMAL LOW (ref >60)
GFR, EST NON AFRICAN AMERICAN: 45 mL/min — AB (ref >60)
GLUCOSE: 180 mg/dL — AB (ref 65–99)
POTASSIUM: 3.9 mmol/L (ref 3.5–5.1)
Sodium: 139 mmol/L (ref 135–145)

## 2017-10-10 LAB — CBC
HEMATOCRIT: 37.3 % — AB (ref 40.0–52.0)
HEMOGLOBIN: 12.3 g/dL — AB (ref 13.0–18.0)
MCH: 29.2 pg (ref 26.0–34.0)
MCHC: 32.9 g/dL (ref 32.0–36.0)
MCV: 88.6 fL (ref 80.0–100.0)
Platelets: 143 10*3/uL — ABNORMAL LOW (ref 150–440)
RBC: 4.21 MIL/uL — ABNORMAL LOW (ref 4.40–5.90)
RDW: 16.1 % — AB (ref 11.5–14.5)
WBC: 7.6 10*3/uL (ref 3.8–10.6)

## 2017-10-10 LAB — GLUCOSE, CAPILLARY
GLUCOSE-CAPILLARY: 204 mg/dL — AB (ref 65–99)
Glucose-Capillary: 193 mg/dL — ABNORMAL HIGH (ref 65–99)
Glucose-Capillary: 181 mg/dL — ABNORMAL HIGH (ref 65–99)
Glucose-Capillary: 174 mg/dL — ABNORMAL HIGH (ref 65–99)

## 2017-10-10 LAB — HEMOGLOBIN A1C
Hgb A1c MFr Bld: 6.2 % — ABNORMAL HIGH (ref 4.8–5.6)
MEAN PLASMA GLUCOSE: 131.24 mg/dL

## 2017-10-10 LAB — LACTIC ACID, PLASMA: LACTIC ACID, VENOUS: 1.8 mmol/L (ref 0.5–1.9)

## 2017-10-10 LAB — BRAIN NATRIURETIC PEPTIDE: B Natriuretic Peptide: 71 pg/mL (ref 0.0–100.0)

## 2017-10-10 LAB — TROPONIN I

## 2017-10-10 MED ORDER — ACETAMINOPHEN 650 MG RE SUPP: 650.0000 mg | Freq: Four times a day (QID) | RECTAL | Status: DC | PRN

## 2017-10-10 MED ORDER — INSULIN ASPART 100 UNIT/ML ~~LOC~~ SOLN
0.0000 [IU] | Freq: Every day | SUBCUTANEOUS | Status: DC
  Administered 2017-10-11: 22:00:00 2 [IU] via SUBCUTANEOUS
  Filled 2017-10-10: qty 1

## 2017-10-10 MED ORDER — PRIMIDONE 50 MG PO TABS
50.0000 mg | ORAL_TABLET | Freq: Every day | ORAL | Status: DC
  Administered 2017-10-10 – 2017-10-12 (×3): 50 mg via ORAL
  Filled 2017-10-10 (×3): qty 1

## 2017-10-10 MED ORDER — ALBUTEROL SULFATE (2.5 MG/3ML) 0.083% IN NEBU: 2.5000 mg | INHALATION_SOLUTION | RESPIRATORY_TRACT | Status: DC | PRN

## 2017-10-10 MED ORDER — METHYLPREDNISOLONE SODIUM SUCC 125 MG IJ SOLR
125.0000 mg | Freq: Once | INTRAMUSCULAR | Status: AC
  Administered 2017-10-10: 125 mg via INTRAVENOUS
  Filled 2017-10-10: qty 2

## 2017-10-10 MED ORDER — METHYLPREDNISOLONE SODIUM SUCC 125 MG IJ SOLR
60.0000 mg | Freq: Two times a day (BID) | INTRAMUSCULAR | Status: DC
  Administered 2017-10-10 – 2017-10-12 (×5): 60 mg via INTRAVENOUS
  Filled 2017-10-10 (×5): qty 2

## 2017-10-10 MED ORDER — MOMETASONE FURO-FORMOTEROL FUM 100-5 MCG/ACT IN AERO
2.0000 | INHALATION_SPRAY | Freq: Two times a day (BID) | RESPIRATORY_TRACT | Status: DC
  Administered 2017-10-10 – 2017-10-11 (×4): 2 via RESPIRATORY_TRACT
  Filled 2017-10-10: qty 8.8

## 2017-10-10 MED ORDER — SIMVASTATIN 20 MG PO TABS
10.0000 mg | ORAL_TABLET | Freq: Every evening | ORAL | Status: DC
  Administered 2017-10-10 – 2017-10-11 (×2): 10 mg via ORAL
  Filled 2017-10-10 (×2): qty 1

## 2017-10-10 MED ORDER — ONDANSETRON HCL 4 MG/2ML IJ SOLN: 4.0000 mg | Freq: Four times a day (QID) | INTRAMUSCULAR | Status: DC | PRN

## 2017-10-10 MED ORDER — ALBUTEROL SULFATE (2.5 MG/3ML) 0.083% IN NEBU
5.0000 mg | INHALATION_SOLUTION | Freq: Once | RESPIRATORY_TRACT | Status: AC
  Administered 2017-10-10: 5 mg via RESPIRATORY_TRACT
  Filled 2017-10-10: qty 6

## 2017-10-10 MED ORDER — ALBUTEROL SULFATE (2.5 MG/3ML) 0.083% IN NEBU
2.5000 mg | INHALATION_SOLUTION | Freq: Once | RESPIRATORY_TRACT | Status: AC
  Administered 2017-10-10: 2.5 mg via RESPIRATORY_TRACT
  Filled 2017-10-10: qty 3

## 2017-10-10 MED ORDER — APIXABAN 5 MG PO TABS
5.0000 mg | ORAL_TABLET | Freq: Two times a day (BID) | ORAL | Status: DC
  Administered 2017-10-10 – 2017-10-12 (×5): 5 mg via ORAL
  Filled 2017-10-10 (×5): qty 1

## 2017-10-10 MED ORDER — ACETAMINOPHEN 325 MG PO TABS: 650.0000 mg | ORAL_TABLET | Freq: Four times a day (QID) | ORAL | Status: DC | PRN

## 2017-10-10 MED ORDER — OXYCODONE HCL 5 MG PO TABS: 5.0000 mg | ORAL_TABLET | Freq: Three times a day (TID) | ORAL | Status: DC | PRN

## 2017-10-10 MED ORDER — IPRATROPIUM-ALBUTEROL 0.5-2.5 (3) MG/3ML IN SOLN
3.0000 mL | Freq: Four times a day (QID) | RESPIRATORY_TRACT | Status: DC
  Administered 2017-10-10 (×2): 3 mL via RESPIRATORY_TRACT
  Filled 2017-10-10 (×2): qty 3

## 2017-10-10 MED ORDER — ONDANSETRON HCL 4 MG PO TABS: 4.0000 mg | ORAL_TABLET | Freq: Four times a day (QID) | ORAL | Status: DC | PRN

## 2017-10-10 MED ORDER — INSULIN ASPART 100 UNIT/ML ~~LOC~~ SOLN
0.0000 [IU] | Freq: Three times a day (TID) | SUBCUTANEOUS | Status: DC
  Administered 2017-10-10: 5 [IU] via SUBCUTANEOUS
  Administered 2017-10-10 – 2017-10-11 (×3): 3 [IU] via SUBCUTANEOUS
  Administered 2017-10-11: 2 [IU] via SUBCUTANEOUS
  Administered 2017-10-11 – 2017-10-12 (×2): 3 [IU] via SUBCUTANEOUS
  Filled 2017-10-10 (×7): qty 1

## 2017-10-10 MED ORDER — POLYETHYLENE GLYCOL 3350 17 G PO PACK: 17.0000 g | PACK | Freq: Every day | ORAL | Status: DC | PRN

## 2017-10-10 MED ORDER — ENOXAPARIN SODIUM 40 MG/0.4ML ~~LOC~~ SOLN: 40.0000 mg | SUBCUTANEOUS | Status: DC

## 2017-10-10 MED ORDER — DOXYCYCLINE HYCLATE 100 MG PO TABS
100.0000 mg | ORAL_TABLET | Freq: Two times a day (BID) | ORAL | Status: DC
  Filled 2017-10-10: qty 1

## 2017-10-10 MED ORDER — METOPROLOL SUCCINATE ER 50 MG PO TB24
50.0000 mg | ORAL_TABLET | Freq: Every day | ORAL | Status: DC
  Administered 2017-10-10 – 2017-10-12 (×3): 50 mg via ORAL
  Filled 2017-10-10 (×3): qty 1

## 2017-10-10 NOTE — ED Triage Notes (Signed)
Patient saw PCO today and prescribed Augmentin and steroid to help him breath. Patient is a lung cancer survivor, and wears oxygen 2L [place] prn. Patient c/o SOB.

## 2017-10-10 NOTE — Progress Notes (Signed)
Admitted morning for COPD exacerbation and chest x-ray did not show pneumonia, normal blood work.  Medications, lab data reviewed. Physical exam shows patient has mild expiratory wheeze bilaterally. 1.  Acute on chronic respiratory failure secondary to COPD exacerbation continue bronchodilators COPD exacerbation with acute on chronic hypoxic respiratory failure secondary to COPD exacerbation: Continue nebulizers, steroids, wean off oxygen as tolerated, patient told me that he has oxygen at home but he takes as needed. 2.  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 Darvin Neighbours and now is DNR. History of PE: Patient is on full anticoagulation with Eliquis. Time spent 20 minutes Discussed with patient's nurse Robin.

## 2017-10-10 NOTE — Progress Notes (Signed)
Advance care planning  Met with patient and his healthcare power of attorney his wife at bedside. Patient is being admitted to the hospital for COPD exacerbation with acute on chronic hypoxic respiratory failure.  Patient was in the hospital recently for spontaneous pneumothorax and during that admission patient's CODE STATUS was full code.  We discussed regarding patient's lung cancer which he tells me he is in remission at this time.  Also discussed regarding COPD and its long-term course and prognosis.  I explained that COPD would worsen over time and he might have more frequent exacerbations in spite of being on appropriate treatment.  He is hopeful he will do better with his breathing now that his lung cancer is in remission.  We discussed regarding his CODE STATUS if he ends up needing ventilatory support or CPR or shocking.  Patient does not want to be on a ventilator or have CPR.  Explained DO NOT RESUSCITATE and DO NOT INTUBATE.  Patient wishes to change his CODE STATUS.  Orders entered for DNR and DNI.  Time spent 20 minutes

## 2017-10-10 NOTE — H&P (Signed)
Chris Wilkinson NAME: Chris Wilkinson    MR#:  [id number]  DATE OF BIRTH:  September 22, 1940  DATE OF ADMISSION:  10/10/2017  PRIMARY CARE PHYSICIAN: Danelle Berry, NP   REQUESTING/REFERRING PHYSICIAN: Dr. Clearnce Hasten  CHIEF COMPLAINT:   Chief Complaint  Patient presents with  . Shortness of Breath    HISTORY OF PRESENT ILLNESS:  Chris Wilkinson  is a 77 y.o. male with a known history of COPD, chronic respiratory failure, stage IIIb lung cancer, history of PE on anticoagulation presents to the emergency room with worsening shortness of breath over the last 2-3 days.  He went to see his pulmonologist Dr. Yancey Flemings and was given Augmentin and methyl prednisolone earlier today.  His breathing continued to worsen even prior to taking any doses and he came to the emergency room. Here patient has been given 3 rounds of nebulizers and IV steroids and continues to have significant wheezing, tachycardia and increased work of breathing.  Patient is being admitted to the hospitalist service.  PAST MEDICAL HISTORY:   Past Medical History:  Diagnosis Date  . Cancer ([place])    Lung CA  . CHF (congestive heart failure) ([place])   . COPD (chronic obstructive pulmonary disease) ([place])   . Coronary artery disease   . Diabetes mellitus without complication ([place])   . Fibromyalgia   . GERD (gastroesophageal reflux disease)   . Hypertension   . Pulmonary emboli ([place])     PAST SURGICAL HISTORY:   Past Surgical History:  Procedure Laterality Date  . COLONOSCOPY WITH PROPOFOL N/A 01/18/2015   Procedure: COLONOSCOPY WITH PROPOFOL;  Surgeon: Manya Silvas, MD;  Location: [facility] ENDOSCOPY;  Service: Endoscopy;  Laterality: N/A;  . ESOPHAGOGASTRODUODENOSCOPY N/A 01/18/2015   Procedure: ESOPHAGOGASTRODUODENOSCOPY (EGD);  Surgeon: Manya Silvas, MD;  Location: [facility] ENDOSCOPY;  Service: Endoscopy;  Laterality: N/A;  . EYE SURGERY    . FLEXIBLE BRONCHOSCOPY N/A 10/22/2016    Procedure: FLEXIBLE BRONCHOSCOPY;  Surgeon: Allyne Gee, MD;  Location: [facility] ORS;  Service: Pulmonary;  Laterality: N/A;  . HERNIA REPAIR    . IR FLUORO GUIDE PORT INSERTION LEFT  11/07/2016  . REPAIR KNEE LIGAMENT    . TONSILLECTOMY      SOCIAL HISTORY:   Social History   Tobacco Use  . Smoking status: Former Smoker    Last attempt to quit: 12/01/2003    Years since quitting: 13.8  . Smokeless tobacco: Never Used  Substance Use Topics  . Alcohol use: Not Currently    Alcohol/week: 1.2 oz    Types: 2 Shots of liquor per week    Frequency: Never    Comment: on the weeknd 2 shots    FAMILY HISTORY:   Family History  Problem Relation Age of Onset  . Prostate cancer Father   . Prostate cancer Brother   . Diabetes Unknown        all 13 siblings  . Hypertension Unknown        all 13 siblings    DRUG ALLERGIES:  No Known Allergies  REVIEW OF SYSTEMS:   Review of Systems  Constitutional: Positive for chills, diaphoresis and malaise/fatigue. Negative for fever.  HENT: Negative for sore throat.   Eyes: Negative for blurred vision, double vision and pain.  Respiratory: Positive for cough, sputum production, shortness of breath and wheezing. Negative for hemoptysis.   Cardiovascular: Negative for chest pain, palpitations, orthopnea and leg swelling.  Gastrointestinal: Negative for abdominal pain,  constipation, diarrhea, heartburn, nausea and vomiting.  Genitourinary: Negative for dysuria and hematuria.  Musculoskeletal: Negative for back pain and joint pain.  Skin: Negative for rash.  Neurological: Negative for sensory change, speech change, focal weakness and headaches.  Endo/Heme/Allergies: Does not bruise/bleed easily.  Psychiatric/Behavioral: Negative for depression. The patient is not nervous/anxious.     MEDICATIONS AT HOME:   Prior to Admission medications   Medication Sig Start Date End Date Taking? Authorizing Provider  acetaminophen (TYLENOL) 325 MG tablet  Take 2 tablets (650 mg total) by mouth every 6 (six) hours as needed for mild pain (or Fever >/= 101). 09/26/16   [name], MD  amoxicillin-clavulanate (AUGMENTIN) 875-125 MG tablet Take 1 tablet by mouth 2 (two) times daily. 10/09/17   Ronnell Freshwater, NP  apixaban (ELIQUIS) 5 MG TABS tablet Take 1 tablet (5 mg total) by mouth 2 (two) times daily. 04/10/17   Verlon Au, NP  doxycycline (DORYX) 100 MG EC tablet Take 100 mg by mouth 2 (two) times daily.    [provider]  ipratropium-albuterol (DUONEB) 0.5-2.5 (3) MG/3ML SOLN Take 3 mLs by nebulization every 6 (six) hours as needed. 10/09/17   Ronnell Freshwater, NP  methylPREDNISolone (MEDROL) 4 MG TBPK tablet Take by mouth as directed for 6 days 10/09/17   Ronnell Freshwater, NP  metoprolol succinate (TOPROL-XL) 50 MG 24 hr tablet Take 50 mg by mouth daily. Take with or immediately following a meal.    [provider]  mometasone-formoterol (DULERA) 100-5 MCG/ACT AERO Inhale 2 puffs into the lungs 2 times daily at 12 noon and 4 pm. 09/17/17   Allyne Gee, MD  NONFORMULARY OR COMPOUNDED ITEM Take 30 mLs by mouth every 6 (six) hours as needed for up to 2 doses. Mix Maalox + 2% Viscous Lidocaine + Donnatal in equal amounts. Take 30 ml PO q6hrs. Do not exceed 3 doses per day. Patient not taking: Reported on 10/09/2017 09/30/17   Milinda Pointer, MD  oxyCODONE (OXY IR/ROXICODONE) 5 MG immediate release tablet Take 1 tablet (5 mg total) by mouth every 8 (eight) hours as needed for severe pain. 09/30/17 10/30/17  Milinda Pointer, MD  primidone (MYSOLINE) 50 MG tablet Take 1 tablet by mouth daily. 08/18/17   [provider]  simvastatin (ZOCOR) 10 MG tablet Take 10 mg by mouth every evening.     [provider]  TRADJENTA 5 MG TABS tablet Take 5 mg by mouth daily. 08/25/17   [provider]  Vitamin D, Ergocalciferol, (DRISDOL) 50000 units CAPS capsule Take 1 capsule by mouth every Thursday. 08/25/17    [provider]  prochlorperazine (COMPAZINE) 10 MG tablet Take 1 tablet (10 mg total) by mouth every 6 (six) hours as needed (Nausea or vomiting). Patient not taking: Reported on 12/04/2016 11/04/16 02/03/17  Sindy Guadeloupe, MD     VITAL SIGNS:  Blood pressure 112/76, pulse (!) 113, temperature 99.8 F (37.7 C), temperature source Oral, resp. rate 16, weight 90.7 kg (200 lb), SpO2 100 %.  PHYSICAL EXAMINATION:  Physical Exam  GENERAL:  77 y.o.-year-old patient lying in the bed with conversational dyspnea.  Audible wheezing. EYES: Pupils equal, round, reactive to light and accommodation. No scleral icterus. Extraocular muscles intact.  HEENT: Head atraumatic, normocephalic. Oropharynx and nasopharynx clear. No oropharyngeal erythema, moist oral mucosa  NECK:  Supple, no jugular venous distention. No thyroid enlargement, no tenderness.  LUNGS: Increased work of breathing with bilateral wheezing and decreased air entry CARDIOVASCULAR:  S1, S2 normal. No murmurs, rubs, or gallops.  ABDOMEN: Soft, nontender, nondistended. Bowel sounds present. No organomegaly or mass.  EXTREMITIES: No cyanosis, or clubbing. + 2 pedal & radial pulses b/l.  Bilateral lower examinee edema NEUROLOGIC: Cranial nerves II through XII are intact. No focal Motor or sensory deficits appreciated b/l PSYCHIATRIC: The patient is alert and oriented x 3. Good affect.  SKIN: No obvious rash, lesion, or ulcer.   LABORATORY PANEL:   CBC Recent Labs  Lab 10/10/17 0042  WBC 8.3  HGB 12.4*  HCT 37.9*  PLT 159   ------------------------------------------------------------------------------------------------------------------  Chemistries  Recent Labs  Lab 10/10/17 0042  NA 138  K 3.7  CL 103  CO2 26  GLUCOSE 161*  BUN 13  CREATININE 1.58*  CALCIUM 9.7  AST 22  ALT 19  ALKPHOS 92  BILITOT 0.5    ------------------------------------------------------------------------------------------------------------------  Cardiac Enzymes Recent Labs  Lab 10/10/17 0042  TROPONINI <0.03   ------------------------------------------------------------------------------------------------------------------  RADIOLOGY:  Dg Chest Portable 1 View  Result Date: 10/10/2017 CLINICAL DATA:  77 y/o  M; shortness of breath. EXAM: PORTABLE CHEST 1 VIEW COMPARISON:  09/08/2017 chest radiograph.  09/07/2017 CT chest. FINDINGS: Stable cardiomediastinal silhouette with paramediastinal scarring/atelectasis. 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 Stable pulmonary nodule in the right suprahilar region better characterized on prior CT of chest. Left port catheter tip projects over lower SVC. No focal consolidation, effusion, or pneumothorax identified. Bones are unremarkable. IMPRESSION: 1. No acute pulmonary process identified. 2. 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 3. Right suprahilar pulmonary nodule better characterized on prior CT of chest. Electronically Signed   By: Kristine Garbe M.D.   On: 10/10/2017 01:36     IMPRESSION AND PLAN:   *Acute COPD exacerbation with acute on chronic hypoxic respiratory failure -IV steroids, Antibiotics - Scheduled Nebulizers - Inhalers -Wean O2 as tolerated - Consult pulmonary if no improvement  *Stage IIIb lung cancer.  Status post chemotherapy and radiation.  In remission according to patient.  *Diabetes mellitus.  Sliding scale insulin added.  Due to IV steroids will likely be uncontrolled with hyperglycemia.  *Hypertension.  Continue home medications  *CKD stage III stable  *History of pulmonary emboli on anticoagulation   All the records are reviewed and case discussed with ED provider. Management plans discussed with the patient, family and they are in agreement.  CODE STATUS: DNR  TOTAL TIME  TAKING CARE OF THIS PATIENT: 35 minutes.   Leia Alf Nikie Cid M.D on 10/10/2017 at 2:47 AM  Between 7am to 6pm - Pager - [phone]  After 6pm go to [email or web address] - password [email or web address] [facility]  Office  [phone]  CC: Primary care physician; Danelle Berry, NP  Note: This dictation was prepared with Dragon dictation along with smaller phrase technology. Any transcriptional errors that result from this process are unintentional.

## 2017-10-10 NOTE — Plan of Care (Signed)
  Problem: Pain Managment: Goal: General experience of comfort will improve Outcome: Progressing  -no c/o pain this shift Problem: Safety: Goal: Ability to remain free from injury will improve Outcome: Progressing  -bed alarm in place; spouse at bedside Problem: Activity: Goal: Ability to tolerate increased activity will improve Outcome: Not Progressing  -pt verbalized that he has bad shortness of breath if he tries to get oob; using urinal at bedside

## 2017-10-10 NOTE — ED Provider Notes (Signed)
[facility] Emergency Department Provider Note  ___________________________________________   First MD Initiated Contact with Patient 10/10/17 [phone]     (approximate)  I have reviewed the triage vital signs and the nursing notes.   HISTORY  Chief Complaint Shortness of Breath   HPI Chris Wilkinson is a 77 y.o. male with a history of lung cancer, CHF as well as COPD who is presenting to the emergency department shortness of breath over the past 2 days.  He denies a productive cough or any pain.  Says that he was seen by his primary care doctor today and given a Medrol Dosepak as well as Augmentin.  He has been using multiple inhalers at home without relief.  Says that he also has experienced increased swelling to his bilateral lower extremities.  Patient also initially hypoxic to 80% on 3 L nasal cannula.  Uses nasal cannula oxygen at home as needed.  Past Medical History:  Diagnosis Date  . Cancer ([place])    Lung CA  . CHF (congestive heart failure) ([place])   . COPD (chronic obstructive pulmonary disease) ([place])   . Coronary artery disease   . Diabetes mellitus without complication ([place])   . Fibromyalgia   . GERD (gastroesophageal reflux disease)   . Hypertension   . Pulmonary emboli [facility])     Patient Active Problem List   Diagnosis Date Noted  . Cervicalgia 09/30/2017  . Tendinitis of rotator cuff (Right) 09/30/2017  . DJD of AC (acromioclavicular) joint (Right) 09/30/2017  . 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 DDD (degenerative disc disease), cervical 09/30/2017  . Spondylosis without myelopathy or radiculopathy, cervical region 09/30/2017  . Atypical facial pain (Right) 09/30/2017  . Problems influencing health status 09/30/2017  . Abnormal MRI, shoulder (08/20/2011) (Right) 09/30/2017  . Cancer related pain 09/30/2017  . Pain after radiation therapy 09/30/2017  . Elevated sed rate 09/30/2017  . Elevated C-reactive protein (CRP)  09/30/2017  . Pneumothorax 09/07/2017  . Chronic throat pain (Primary Area of Pain) 08/12/2017  . Chronic secondary facial pain (Secondary Area of Pain) (Right) 08/12/2017  . Chronic neck pain (Tertiary Area of Pain) 08/12/2017  . Chronic pain syndrome 08/12/2017  . Other long term (current) drug therapy 08/12/2017  . Disorder of skeletal system 08/12/2017  . Other specified health status 08/12/2017  . Long term current use of opiate analgesic 08/12/2017  . Encounter for antineoplastic immunotherapy 02/27/2017  . Carpal tunnel syndrome 12/04/2016  . Sprain of wrist 12/04/2016  . Pharmacologic therapy 11/20/2016  . Lung cancer ([place]) 11/04/2016  . HCAP (healthcare-associated pneumonia) 09/22/2016  . Sepsis ([place]) 08/04/2015  . CAP (community acquired pneumonia) 08/04/2015  . COPD (chronic obstructive pulmonary disease) ([place]) 08/04/2015  . Type 2 diabetes mellitus ([place]) 08/04/2015  . CAD (coronary artery disease) 08/04/2015  . HTN (hypertension) 08/04/2015  . GERD (gastroesophageal reflux disease) 08/04/2015  . Bloating 12/04/2014  . Gas 12/04/2014  . Hx of adenomatous polyp of colon 12/04/2014    Past Surgical History:  Procedure Laterality Date  . COLONOSCOPY WITH PROPOFOL N/A 01/18/2015   Procedure: COLONOSCOPY WITH PROPOFOL;  Surgeon: Manya Silvas, MD;  Location: [facility] ENDOSCOPY;  Service: Endoscopy;  Laterality: N/A;  . ESOPHAGOGASTRODUODENOSCOPY N/A 01/18/2015   Procedure: ESOPHAGOGASTRODUODENOSCOPY (EGD);  Surgeon: Manya Silvas, MD;  Location: [facility] ENDOSCOPY;  Service: Endoscopy;  Laterality: N/A;  . EYE SURGERY    . FLEXIBLE BRONCHOSCOPY N/A 10/22/2016   Procedure: FLEXIBLE BRONCHOSCOPY;  Surgeon: Allyne Gee, MD;  Location: [facility] ORS;  Service: Pulmonary;  Laterality: N/A;  . HERNIA REPAIR    . IR FLUORO GUIDE PORT INSERTION LEFT  11/07/2016  . REPAIR KNEE LIGAMENT    . TONSILLECTOMY      Prior to Admission medications   Medication Sig Start Date End Date Taking?  Authorizing Provider  acetaminophen (TYLENOL) 325 MG tablet Take 2 tablets (650 mg total) by mouth every 6 (six) hours as needed for mild pain (or Fever >/= 101). 09/26/16   [name], MD  amoxicillin-clavulanate (AUGMENTIN) 875-125 MG tablet Take 1 tablet by mouth 2 (two) times daily. 10/09/17   Ronnell Freshwater, NP  apixaban (ELIQUIS) 5 MG TABS tablet Take 1 tablet (5 mg total) by mouth 2 (two) times daily. 04/10/17   Verlon Au, NP  doxycycline (DORYX) 100 MG EC tablet Take 100 mg by mouth 2 (two) times daily.    [provider]  ipratropium-albuterol (DUONEB) 0.5-2.5 (3) MG/3ML SOLN Take 3 mLs by nebulization every 6 (six) hours as needed. 10/09/17   Ronnell Freshwater, NP  methylPREDNISolone (MEDROL) 4 MG TBPK tablet Take by mouth as directed for 6 days 10/09/17   Ronnell Freshwater, NP  metoprolol succinate (TOPROL-XL) 50 MG 24 hr tablet Take 50 mg by mouth daily. Take with or immediately following a meal.    [provider]  mometasone-formoterol (DULERA) 100-5 MCG/ACT AERO Inhale 2 puffs into the lungs 2 times daily at 12 noon and 4 pm. 09/17/17   Allyne Gee, MD  NONFORMULARY OR COMPOUNDED ITEM Take 30 mLs by mouth every 6 (six) hours as needed for up to 2 doses. Mix Maalox + 2% Viscous Lidocaine + Donnatal in equal amounts. Take 30 ml PO q6hrs. Do not exceed 3 doses per day. Patient not taking: Reported on 10/09/2017 09/30/17   Milinda Pointer, MD  oxyCODONE (OXY IR/ROXICODONE) 5 MG immediate release tablet Take 1 tablet (5 mg total) by mouth every 8 (eight) hours as needed for severe pain. 09/30/17 10/30/17  Milinda Pointer, MD  primidone (MYSOLINE) 50 MG tablet Take 1 tablet by mouth daily. 08/18/17   [provider]  simvastatin (ZOCOR) 10 MG tablet Take 10 mg by mouth every evening.     [provider]  TRADJENTA 5 MG TABS tablet Take 5 mg by mouth daily. 08/25/17   [provider]  Vitamin D, Ergocalciferol, (DRISDOL) 50000 units CAPS  capsule Take 1 capsule by mouth every Thursday. 08/25/17   [provider]  prochlorperazine (COMPAZINE) 10 MG tablet Take 1 tablet (10 mg total) by mouth every 6 (six) hours as needed (Nausea or vomiting). Patient not taking: Reported on 12/04/2016 11/04/16 02/03/17  Sindy Guadeloupe, MD    Allergies Patient has no known allergies.  Family History  Problem Relation Age of Onset  . Prostate cancer Father   . Prostate cancer Brother   . Diabetes Unknown        all 13 siblings  . Hypertension Unknown        all 13 siblings    Social History Social History   Tobacco Use  . Smoking status: Former Smoker    Last attempt to quit: 12/01/2003    Years since quitting: 13.8  . Smokeless tobacco: Never Used  Substance Use Topics  . Alcohol use: Not Currently    Alcohol/week: 1.2 oz    Types: 2 Shots of liquor per week    Frequency: Never    Comment: on the weeknd 2  shots  . Drug use: No    Review of Systems  Constitutional: No fever/chills Eyes: No visual changes. ENT: No sore throat. Cardiovascular: Denies chest pain. Respiratory: As above Gastrointestinal: No abdominal pain.  No nausea, no vomiting.  No diarrhea.  No constipation. Genitourinary: Negative for dysuria. Musculoskeletal: Negative for back pain. Skin: Negative for rash. Neurological: Negative for headaches, focal weakness or numbness.   ____________________________________________   PHYSICAL EXAM:  VITAL SIGNS: ED Triage Vitals [10/10/17 0027]  Enc Vitals Group     BP 136/82     Pulse Rate (!) 119     Resp (!) 23     Temp 99.8 F (37.7 C)     Temp Source Oral     SpO2 93 %     Weight 200 lb (90.7 kg)     Height      Head Circumference      Peak Flow      Pain Score      Pain Loc      Pain Edu?      Excl. in [place]?     Constitutional: Alert and oriented. Well appearing and in no acute distress. Eyes: Conjunctivae are normal.  Head: Atraumatic. Nose: No congestion/rhinnorhea. Mouth/Throat:  Mucous membranes are moist.  Neck: No stridor.   Cardiovascular: Normal rate, regular rhythm. Grossly normal heart sounds.   Respiratory: Normal respiratory effort.  Speaking in full sentences.  Rhonchi on the right greater than the left.  Prolonged expiratory phase. Gastrointestinal: Soft and nontender. No distention. No CVA tenderness. Musculoskeletal: Moderate bilateral lower extremity edema. Neurologic:  Normal speech and language. No gross focal neurologic deficits are appreciated. Skin:  Skin is warm, dry and intact. No rash noted. Psychiatric: Mood and affect are normal. Speech and behavior are normal.  ____________________________________________   LABS (all labs ordered are listed, but only abnormal results are displayed)  Labs Reviewed  CBC WITH DIFFERENTIAL/PLATELET - Abnormal; Notable for the following components:      Result Value   RBC 4.26 (*)    Hemoglobin 12.4 (*)    HCT 37.9 (*)    RDW 16.1 (*)    Lymphs Abs 0.6 (*)    Monocytes Absolute 1.3 (*)    All other components within normal limits  COMPREHENSIVE METABOLIC PANEL - Abnormal; Notable for the following components:   Glucose, Bld 161 (*)    Creatinine, Ser 1.58 (*)    Total Protein 8.5 (*)    Albumin 3.2 (*)    GFR calc non Af Amer 41 (*)    GFR calc Af Amer 48 (*)    All other components within normal limits  CULTURE, BLOOD (ROUTINE X 2)  CULTURE, BLOOD (ROUTINE X 2)  TROPONIN I  LACTIC ACID, PLASMA  BRAIN NATRIURETIC PEPTIDE   ____________________________________________  EKG  ED ECG REPORT I, Doran Stabler, the attending physician, personally viewed and interpreted this ECG.   Date: 10/10/2017  EKG Time: 0028  Rate: 120  Rhythm: sinus tachycardia  Axis: Normal  Intervals:nonspecific intraventricular conduction delay  ST&T Change: No ST segment elevation or depression.  No abnormal T wave inversion.  ____________________________________________  RADIOLOGY  No acute pulmonary  process on the x-ray ____________________________________________   PROCEDURES  Procedure(s) performed:   .Critical Care Performed by: Orbie Pyo, MD Authorized by: Orbie Pyo, MD   Critical care provider statement:    Critical care time (minutes):  35   Critical care was necessary to treat or prevent imminent  or life-threatening deterioration of the following conditions:  Respiratory failure   Critical care was time spent personally by me on the following activities:  Examination of patient, ordering and performing treatments and interventions, ordering and review of laboratory studies, ordering and review of radiographic studies, pulse oximetry and re-evaluation of patient's condition    Critical Care performed:   ____________________________________________   INITIAL IMPRESSION / ASSESSMENT AND PLAN / ED COURSE  Pertinent labs & imaging results that were available during my care of the patient were reviewed by me and considered in my medical decision making (see chart for details).  Differential includes, but is not limited to, viral syndrome, bronchitis including COPD exacerbation, pneumonia, reactive airway disease including asthma, CHF including exacerbation with or without pulmonary/interstitial edema, pneumothorax, ACS, thoracic trauma, and pulmonary embolism. As part of my medical decision making, I reviewed the following data within the electronic MEDICAL RECORD NUMBER Notes from prior ED visits  ----------------------------------------- 2:43 AM on 10/10/2017 -----------------------------------------  Patient still wheezing despite multiple neb treatments and steroids.  Patient to be admitted to the hospital.  Signed out to Dr. Darvin Neighbours.  Patient and family aware of need for admission and understanding and willing to comply.  ____________________________________________   FINAL CLINICAL IMPRESSION(S) / ED DIAGNOSES  COPD  exacerbation.    NEW MEDICATIONS STARTED DURING THIS VISIT:  New Prescriptions   No medications on file     Note:  This document was prepared using Dragon voice recognition software and may include unintentional dictation errors.     Orbie Pyo, MD 10/10/17 [phone]

## 2017-10-11 LAB — GLUCOSE, CAPILLARY
GLUCOSE-CAPILLARY: 203 mg/dL — AB (ref 65–99)
Glucose-Capillary: 162 mg/dL — ABNORMAL HIGH (ref 65–99)
Glucose-Capillary: 135 mg/dL — ABNORMAL HIGH (ref 65–99)
Glucose-Capillary: 191 mg/dL — ABNORMAL HIGH (ref 65–99)

## 2017-10-11 MED ORDER — ORAL CARE MOUTH RINSE
15.0000 mL | Freq: Two times a day (BID) | OROMUCOSAL | Status: DC
  Administered 2017-10-11 – 2017-10-12 (×2): 15 mL via OROMUCOSAL

## 2017-10-11 MED ORDER — IPRATROPIUM-ALBUTEROL 0.5-2.5 (3) MG/3ML IN SOLN
3.0000 mL | Freq: Three times a day (TID) | RESPIRATORY_TRACT | Status: DC
  Administered 2017-10-11 – 2017-10-12 (×4): 3 mL via RESPIRATORY_TRACT
  Filled 2017-10-11 (×4): qty 3

## 2017-10-11 NOTE — Progress Notes (Signed)
[place] [facility] [place] NAME: Chris Wilkinson    MR#:  [id number]  DATE OF BIRTH:  Dec 03, 1940  SUBJECTIVE: Patient is doing better, does have some cough. but shortness of breath improved.  CHIEF COMPLAINT:   Chief Complaint  Patient presents with  . Shortness of Breath    REVIEW OF SYSTEMS:    Review of Systems  Constitutional: Negative for chills and fever.  HENT: Negative for hearing loss.   Eyes: Negative for blurred vision, double vision and photophobia.  Respiratory: Positive for cough and shortness of breath. Negative for hemoptysis.   Cardiovascular: Negative for palpitations, orthopnea and leg swelling.  Gastrointestinal: Negative for abdominal pain, diarrhea and vomiting.  Genitourinary: Negative for dysuria and urgency.  Musculoskeletal: Negative for myalgias and neck pain.  Skin: Negative for rash.  Neurological: Negative for dizziness, focal weakness, seizures, weakness and headaches.  Psychiatric/Behavioral: Negative for memory loss. The patient does not have insomnia.     Nutrition:  Tolerating Diet: Tolerating PT:      DRUG ALLERGIES:  No Known Allergies  VITALS:  Blood pressure 121/75, pulse 88, temperature 97.8 F (36.6 C), temperature source Oral, resp. rate 20, height 5\' 11"  (1.803 m), weight 87.1 kg (192 lb), SpO2 94 %.  PHYSICAL EXAMINATION:   Physical Exam  GENERAL:  77 y.o.-year-old patient lying in the bed with no acute distress.  EYES: Pupils equal, round, reactive to light and accommodation. No scleral icterus. Extraocular muscles intact.  HEENT: Head atraumatic, normocephalic. Oropharynx and nasopharynx clear.  NECK:  Supple, no jugular venous distention. No thyroid enlargement, no tenderness.  LUNGS: Normal breath sounds bilaterally, not much wheezing today  cARDIOVASCULAR: S1, S2 normal. No murmurs, rubs, or gallops.  ABDOMEN: Soft, nontender, nondistended. Bowel sounds present. No  organomegaly or mass.  EXTREMITIES: No pedal edema, cyanosis, or clubbing.  NEUROLOGIC: Cranial nerves II through XII are intact. Muscle strength 5/5 in all extremities. Sensation intact. Gait not checked.  PSYCHIATRIC: The patient is alert and oriented x 3.  SKIN: No obvious rash, lesion, or ulcer.    LABORATORY PANEL:   CBC Recent Labs  Lab 10/10/17 0524  WBC 7.6  HGB 12.3*  HCT 37.3*  PLT 143*   ------------------------------------------------------------------------------------------------------------------  Chemistries  Recent Labs  Lab 10/10/17 0042 10/10/17 0524  NA 138 139  K 3.7 3.9  CL 103 105  CO2 26 23  GLUCOSE 161* 180*  BUN 13 14  CREATININE 1.58* 1.46*  CALCIUM 9.7 9.6  AST 22  --   ALT 19  --   ALKPHOS 92  --   BILITOT 0.5  --    ------------------------------------------------------------------------------------------------------------------  Cardiac Enzymes Recent Labs  Lab 10/10/17 0042  TROPONINI <0.03   ------------------------------------------------------------------------------------------------------------------  RADIOLOGY:  Dg Chest Portable 1 View  Result Date: 10/10/2017 CLINICAL DATA:  77 y/o  M; shortness of breath. EXAM: PORTABLE CHEST 1 VIEW COMPARISON:  09/08/2017 chest radiograph.  09/07/2017 CT chest. FINDINGS: Stable cardiomediastinal silhouette with paramediastinal scarring/atelectasis. 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 Stable pulmonary nodule in the right suprahilar region better characterized on prior CT of chest. Left port catheter tip projects over lower SVC. No focal consolidation, effusion, or pneumothorax identified. Bones are unremarkable. IMPRESSION: 1. No acute pulmonary process identified. 2. 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 3. Right suprahilar pulmonary nodule better characterized on prior CT of chest. Electronically Signed   By: Edgardo Roys.D.  On: 10/10/2017 01:36     ASSESSMENT AND PLAN:   Active Problems:   COPD exacerbation (HCC)  Acute on chronic respiratory failure secondary to COPD exacerbation: Clinically improving, wean down oxygen as tolerated because patient on oxygen at home as needed, continue bronchodilators, IV steroids for today, likely discharge home tomorrow. 2.  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 3.  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   CODE STATUS DNR     All the records are reviewed and case discussed with Care Management/Social Workerr. Management plans discussed with the patient, family and they are in agreement.  CODE STATUS: DNR  TOTAL TIME TAKING CARE OF THIS PATIENT: 35 minutes.   POSSIBLE D/C IN 1DAYS, DEPENDING ON CLINICAL CONDITION.   Epifanio Lesches M.D on 10/11/2017 at 10:22 AM  Between 7am to 6pm - Pager - [phone]  After 6pm go to [email or web address] - password [email or web address] [facility]  [facility]  Office  [phone]  CC: Primary care physician; Danelle Berry, NP

## 2017-10-12 ENCOUNTER — Telehealth: Payer: Self-pay | Admitting: *Deleted

## 2017-10-12 ENCOUNTER — Ambulatory Visit: Admission: RE | Admit: 2017-10-12 | Payer: Medicare Other | Source: Ambulatory Visit

## 2017-10-12 ENCOUNTER — Inpatient Hospital Stay: Payer: Medicare Other

## 2017-10-12 LAB — GLUCOSE, CAPILLARY: GLUCOSE-CAPILLARY: 168 mg/dL — AB (ref 65–99)

## 2017-10-12 MED ORDER — OXYCODONE HCL 5 MG PO TABS: 10.0000 mg | ORAL_TABLET | Freq: Three times a day (TID) | ORAL | 0 refills | Status: DC | PRN

## 2017-10-12 NOTE — Telephone Encounter (Signed)
Pt was admitted to [facility]. And he was scheduled for a ct scan chest , abd, and pelvis for today and wanted to know if pt still needs it.  Dr. Janese Banks reviewed his last ct scan which was angio in late feb and said to hold off on the scan for now. We will see him later this week and go from there. Patient will be d/c from hospital today per MD notes.

## 2017-10-12 NOTE — Progress Notes (Signed)
Pt is being discharged today, discharge instructions reviewed with the patient and his wife, both verified understanding.1 paper prescription was given to him. IV x1 removed, all belongings packed and returned. He was rolled out in a wheelchair by staff.

## 2017-10-12 NOTE — Discharge Summary (Signed)
Chris Wilkinson, is a 77 y.o. male  DOB 02-02-1941  MRN [id number].  Admission date:  10/10/2017  Admitting Physician  Hillary Bow, MD  Discharge Date:  10/12/2017   Primary MD  Danelle Berry, NP  Recommendations for primary care physician for things to follow:   Follow-up with PCP in 1 week Follow-up with Dr. Astrid Divine Raoin 2-3 days   Admission Diagnosis  COPD exacerbation ([place]) [J44.1]   Discharge Diagnosis  COPD exacerbation ([place]) [J44.1]    Active Problems:   COPD exacerbation ([place])      Past Medical History:  Diagnosis Date  . Cancer ([place])    Lung CA  . CHF (congestive heart failure) ([place])   . COPD (chronic obstructive pulmonary disease) ([place])   . Coronary artery disease   . Diabetes mellitus without complication ([place])   . Fibromyalgia   . GERD (gastroesophageal reflux disease)   . Hypertension   . Pulmonary emboli [facility])     Past Surgical History:  Procedure Laterality Date  . COLONOSCOPY WITH PROPOFOL N/A 01/18/2015   Procedure: COLONOSCOPY WITH PROPOFOL;  Surgeon: Manya Silvas, MD;  Location: [facility] ENDOSCOPY;  Service: Endoscopy;  Laterality: N/A;  . ESOPHAGOGASTRODUODENOSCOPY N/A 01/18/2015   Procedure: ESOPHAGOGASTRODUODENOSCOPY (EGD);  Surgeon: Manya Silvas, MD;  Location: [facility] ENDOSCOPY;  Service: Endoscopy;  Laterality: N/A;  . EYE SURGERY    . FLEXIBLE BRONCHOSCOPY N/A 10/22/2016   Procedure: FLEXIBLE BRONCHOSCOPY;  Surgeon: Allyne Gee, MD;  Location: [facility] ORS;  Service: Pulmonary;  Laterality: N/A;  . HERNIA REPAIR    . IR FLUORO GUIDE PORT INSERTION LEFT  11/07/2016  . REPAIR KNEE LIGAMENT    . TONSILLECTOMY         History of present illness and  Hospital Course:     Kindly see H&P for history of present illness and admission details, please review complete Labs, Consult  reports and Test reports for all details in brief  HPI  from the history and physical done on the day of admission 77 year old male patient with history of COPD, chronic respiratory failure, stage IIIb lung cancer, history of PE on anticoagulation came in with worsening shortness of breath for 2-3 days, patient went to PCP and he was given Augmentin, prednisone dose taper but because of his breathing which got worse patient came to hospital.  Patient received a lot of nebulizer in the emergency room but still had a lot of wheezing so admitted to hospitalist service for COPD exacerbation.   Hospital Course  Acute COPD exacerbation: Patient received IV Solu-Medrol along with bronchodilators with DuoNeb's.  Patient wheezing improved and he is eager to go home.  Patient is advised to continue the prednisone Dosepak that he already got from PCP.  Patient also has Dulera, DuoNeb's at home he can take it. 2.  Acute on chronic respiratory failure: Patient has 2 L of oxygen he takes as needed basis.  When he is admitted he required oxygen continuously but no he is lungs are much better, O2 sat 95% on 2 L.  Patient can continue oxygen as needed at home. 3.  Essential hypertension: Controlled, patient is on metoprolol and he can continue Celexa 50 mg daily. 4.  History of chronic pain syndrome, patient is on oxycodone 10 mg every 8 hours and follows up with Dr. Dossie Arbour.  Patient is given limited prescription of oxycodone 12 tablets.  Patient is advised to continue to follow with Dr. Dossie Arbour for his chronic medication supply.  5.  History of stage III lung cancer in remission patient received chemoradiation patient supposed to get a CT of the chest today at 3 PM so we will discharge the patient and then he can keep that appointment, patient has appointment today at 8 AM at [place] , need to reschedule that. 6.  CKD stage III: Stable. 7.  History of PE: Patient is on full anticoagulation. 8.  Diabetes mellitus  type 2 II: Hyperglycemia due to steroids.  Patient is on Tradjenta at home he can continue that he received sliding scale insulin with coverage in the hospital.  #7 .severe emphysema, recent admission for  spontaneous pneumothorax, patient chest x-ray did not show any pneumothorax this time but showed a lot of bullous emphysema.   Discharge Condition: Stable   Follow UP  Follow-up Information    Danelle Berry, NP. Schedule an appointment as soon as possible for a visit in 1 week(s).   Specialty:  Nurse Practitioner Contact information: [place] [address] [phone]        Sindy Guadeloupe, MD. Schedule an appointment as soon as possible for a visit in 3 day(s).   Specialty:  Oncology Contact information: [place] [address] [phone]             Discharge Instructions  and  Discharge Medications      Allergies as of 10/12/2017   No Known Allergies     Medication List    TAKE these medications   acetaminophen 325 MG tablet Commonly known as:  TYLENOL Take 2 tablets (650 mg total) by mouth every 6 (six) hours as needed for mild pain (or Fever >/= 101).   amoxicillin-clavulanate 875-125 MG tablet Commonly known as:  AUGMENTIN Take 1 tablet by mouth 2 (two) times daily.   apixaban 5 MG Tabs tablet Commonly known as:  ELIQUIS Take 1 tablet (5 mg total) by mouth 2 (two) times daily.   benzonatate 100 MG capsule Commonly known as:  TESSALON Take 1 capsule by mouth 3 (three) times daily as needed.   doxycycline 100 MG EC tablet Commonly known as:  DORYX Take 100 mg by mouth 2 (two) times daily.   ipratropium-albuterol 0.5-2.5 (3) MG/3ML Soln Commonly known as:  DUONEB Take 3 mLs by nebulization every 6 (six) hours as needed.   methylPREDNISolone 4 MG Tbpk tablet Commonly known as:  MEDROL Take by mouth as directed for 6 days   metoprolol succinate 50 MG 24 hr tablet Commonly known as:  TOPROL-XL Take 50  mg by mouth daily. Take with or immediately following a meal.   mometasone-formoterol 100-5 MCG/ACT Aero Commonly known as:  DULERA Inhale 2 puffs into the lungs 2 times daily at 12 noon and 4 pm.   NONFORMULARY OR COMPOUNDED ITEM Take 30 mLs by mouth every 6 (six) hours as needed for up to 2 doses. Mix Maalox + 2% Viscous Lidocaine + Donnatal in equal amounts. Take 30 ml PO q6hrs. Do not exceed 3 doses per day.   oxyCODONE 5 MG immediate release tablet Commonly known as:  Oxy IR/ROXICODONE Take 2 tablets (10 mg total) by mouth every 8 (eight) hours as needed for severe pain.   primidone 50 MG tablet Commonly known as:  MYSOLINE Take 1 tablet by mouth daily.   simvastatin 10 MG tablet Commonly known as:  ZOCOR Take 10 mg by mouth every evening.   TRADJENTA 5 MG Tabs tablet Generic drug:  linagliptin Take  5 mg by mouth daily.   Vitamin D (Ergocalciferol) 50000 units Caps capsule Commonly known as:  DRISDOL Take 1 capsule by mouth every Thursday.         Diet and Activity recommendation: See Discharge Instructions above   Consults obtained -none   Major procedures and Radiology Reports - PLEASE review detailed and final reports for all details, in brief -      Dg Chest Portable 1 View  Result Date: 10/10/2017 CLINICAL DATA:  77 y/o  M; shortness of breath. EXAM: PORTABLE CHEST 1 VIEW COMPARISON:  09/08/2017 chest radiograph.  09/07/2017 CT chest. FINDINGS: Stable cardiomediastinal silhouette with paramediastinal scarring/atelectasis. 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 Stable pulmonary nodule in the right suprahilar region better characterized on prior CT of chest. Left port catheter tip projects over lower SVC. No focal consolidation, effusion, or pneumothorax identified. Bones are unremarkable. IMPRESSION: 1. No acute pulmonary process identified. 2. 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 3. Right suprahilar pulmonary  nodule better characterized on prior CT of chest. Electronically Signed   By: Kristine Garbe M.D.   On: 10/10/2017 01:36    Micro Results     Recent Results (from the past 240 hour(s))  Blood culture (routine x 2)     Status: None (Preliminary result)   Collection Time: 10/10/17 12:42 AM  Result Value Ref Range Status   Specimen Description BLOOD LEFT WRIST  Final   Special Requests   Final    BOTTLES DRAWN AEROBIC AND ANAEROBIC Blood Culture adequate volume   Culture   Final    NO GROWTH 2 DAYS Performed at [facility], [address], [place], [place] [address]    Report Status PENDING  Incomplete  Blood culture (routine x 2)     Status: None (Preliminary result)   Collection Time: 10/10/17 12:42 AM  Result Value Ref Range Status   Specimen Description BLOOD LEFT ANTECUBITAL  Final   Special Requests   Final    BOTTLES DRAWN AEROBIC AND ANAEROBIC Blood Culture adequate volume   Culture   Final    NO GROWTH 2 DAYS Performed at [facility], [address] [place], [place] [address]    Report Status PENDING  Incomplete       Today   Subjective:   Chris Wilkinson today has no headache,no chest abdominal pain,no new weakness tingling or numbness, feels much better wants to go home today.   Objective:   Blood pressure 106/66, pulse 84, temperature 97.6 F (36.4 C), temperature source Oral, resp. rate 16, height _0  (1.803 m), weight 87.1 kg (192 lb), SpO2 95 %.   Intake/Output Summary (Last 24 hours) at 10/12/2017 0757 Last data filed at 10/12/2017 0737 Gross per 24 hour  Intake 720 ml  Output 800 ml  Net -80 ml    Exam Awake Alert, Oriented x 3, No new F.N deficits, Normal affect [place].AT,PERRAL Supple Neck,No JVD, No cervical lymphadenopathy appriciated.  Symmetrical Chest wall movement, Good air movement bilaterally, CTAB RRR,No Gallops,Rubs or new Murmurs, No Parasternal Heave +ve B.Sounds, Abd Soft, Non tender, No organomegaly  appriciated, No rebound -guarding or rigidity. No Cyanosis, Clubbing or edema, No new Rash or bruise  Data Review   CBC w Diff:  Lab Results  Component Value Date   WBC 7.6 10/10/2017   HGB 12.3 (L) 10/10/2017   HGB 13.5 01/17/2014   HCT 37.3 (L) 10/10/2017   HCT 41.7  01/17/2014   PLT 143 (L) 10/10/2017   PLT 133 (L) 01/17/2014   LYMPHOPCT 8 10/10/2017   MONOPCT 15 10/10/2017   EOSPCT 1 10/10/2017   BASOPCT 0 10/10/2017    CMP:  Lab Results  Component Value Date   NA 139 10/10/2017   NA 143 08/12/2017   NA 140 01/17/2014   K 3.9 10/10/2017   K 3.7 01/17/2014   CL 105 10/10/2017   CL 108 (H) 01/17/2014   CO2 23 10/10/2017   CO2 23 01/17/2014   BUN 14 10/10/2017   BUN 18 08/12/2017   BUN 18 01/17/2014   CREATININE 1.46 (H) 10/10/2017   CREATININE 1.44 (H) 01/17/2014   PROT 8.5 (H) 10/10/2017   PROT 7.7 08/12/2017   ALBUMIN 3.2 (L) 10/10/2017   ALBUMIN 3.7 08/12/2017   BILITOT 0.5 10/10/2017   BILITOT 0.3 08/12/2017   ALKPHOS 92 10/10/2017   AST 22 10/10/2017   ALT 19 10/10/2017  .   Total Time in preparing paper work, data evaluation and todays exam - 102 minutes  Epifanio Lesches M.D on 10/12/2017 at 7:57 AM    Note: This dictation was prepared with Dragon dictation along with smaller phrase technology. Any transcriptional errors that result from this process are unintentional.

## 2017-10-14 ENCOUNTER — Other Ambulatory Visit: Payer: Medicare Other

## 2017-10-15 ENCOUNTER — Inpatient Hospital Stay: Payer: Medicare Other | Attending: Oncology | Admitting: Oncology

## 2017-10-15 ENCOUNTER — Encounter: Payer: Self-pay | Admitting: Oncology

## 2017-10-15 ENCOUNTER — Inpatient Hospital Stay: Payer: Medicare Other

## 2017-10-15 VITALS — BP 116/78 | Temp 98.0°F | Resp 18 | Ht 71.0 in | Wt 195.0 lb

## 2017-10-15 DIAGNOSIS — J439 Emphysema, unspecified: Secondary | ICD-10-CM | POA: Insufficient documentation

## 2017-10-15 DIAGNOSIS — C3411 Malignant neoplasm of upper lobe, right bronchus or lung: Secondary | ICD-10-CM

## 2017-10-15 DIAGNOSIS — R0902 Hypoxemia: Secondary | ICD-10-CM | POA: Insufficient documentation

## 2017-10-15 DIAGNOSIS — Z87891 Personal history of nicotine dependence: Secondary | ICD-10-CM | POA: Insufficient documentation

## 2017-10-15 DIAGNOSIS — I2510 Atherosclerotic heart disease of native coronary artery without angina pectoris: Secondary | ICD-10-CM | POA: Diagnosis not present

## 2017-10-15 DIAGNOSIS — C3490 Malignant neoplasm of unspecified part of unspecified bronchus or lung: Secondary | ICD-10-CM

## 2017-10-15 DIAGNOSIS — Z86711 Personal history of pulmonary embolism: Secondary | ICD-10-CM | POA: Diagnosis not present

## 2017-10-15 DIAGNOSIS — C770 Secondary and unspecified malignant neoplasm of lymph nodes of head, face and neck: Secondary | ICD-10-CM | POA: Insufficient documentation

## 2017-10-15 DIAGNOSIS — E119 Type 2 diabetes mellitus without complications: Secondary | ICD-10-CM | POA: Insufficient documentation

## 2017-10-15 DIAGNOSIS — Z95828 Presence of other vascular implants and grafts: Secondary | ICD-10-CM

## 2017-10-15 LAB — COMPREHENSIVE METABOLIC PANEL
ALK PHOS: 107 U/L (ref 38–126)
ALT: 23 U/L (ref 17–63)
AST: 18 U/L (ref 15–41)
Albumin: 3.1 g/dL — ABNORMAL LOW (ref 3.5–5.0)
Anion gap: 8 (ref 5–15)
BILIRUBIN TOTAL: 0.4 mg/dL (ref 0.3–1.2)
BUN: 23 mg/dL — AB (ref 6–20)
CALCIUM: 9.7 mg/dL (ref 8.9–10.3)
CO2: 24 mmol/L (ref 22–32)
Chloride: 103 mmol/L (ref 101–111)
Creatinine, Ser: 1.5 mg/dL — ABNORMAL HIGH (ref 0.61–1.24)
GFR, EST AFRICAN AMERICAN: 51 mL/min — AB (ref >60)
GFR, EST NON AFRICAN AMERICAN: 44 mL/min — AB (ref >60)
Glucose, Bld: 213 mg/dL — ABNORMAL HIGH (ref 65–99)
Potassium: 4.2 mmol/L (ref 3.5–5.1)
Sodium: 135 mmol/L (ref 135–145)
Total Protein: 7.6 g/dL (ref 6.5–8.1)

## 2017-10-15 LAB — CBC WITH DIFFERENTIAL/PLATELET
BASOS PCT: 1 %
Basophils Absolute: 0 10*3/uL (ref 0–0.1)
EOS ABS: 0 10*3/uL (ref 0–0.7)
EOS PCT: 1 %
HCT: 36.5 % — ABNORMAL LOW (ref 40.0–52.0)
HEMOGLOBIN: 12.3 g/dL — AB (ref 13.0–18.0)
LYMPHS ABS: 0.5 10*3/uL — AB (ref 1.0–3.6)
Lymphocytes Relative: 6 %
MCH: 29.5 pg (ref 26.0–34.0)
MCHC: 33.6 g/dL (ref 32.0–36.0)
MCV: 87.8 fL (ref 80.0–100.0)
MONOS PCT: 13 %
Monocytes Absolute: 1.1 10*3/uL — ABNORMAL HIGH (ref 0.2–1.0)
NEUTROS PCT: 79 %
Neutro Abs: 6.9 10*3/uL — ABNORMAL HIGH (ref 1.4–6.5)
PLATELETS: 175 10*3/uL (ref 150–440)
RBC: 4.16 MIL/uL — AB (ref 4.40–5.90)
RDW: 15.5 % — ABNORMAL HIGH (ref 11.5–14.5)
WBC: 8.6 10*3/uL (ref 3.8–10.6)

## 2017-10-15 LAB — CULTURE, BLOOD (ROUTINE X 2)
CULTURE: NO GROWTH
Culture: NO GROWTH
SPECIAL REQUESTS: ADEQUATE
SPECIAL REQUESTS: ADEQUATE

## 2017-10-15 MED ORDER — HEPARIN SOD (PORK) LOCK FLUSH 100 UNIT/ML IV SOLN
500.0000 [IU] | INTRAVENOUS | Status: AC | PRN
  Administered 2017-10-15: 500 [IU]

## 2017-10-15 MED ORDER — SODIUM CHLORIDE 0.9% FLUSH
10.0000 mL | INTRAVENOUS | Status: AC | PRN
  Administered 2017-10-15: 10 mL
  Filled 2017-10-15: qty 10

## 2017-10-15 NOTE — Progress Notes (Signed)
No new changes noted today 

## 2017-10-16 ENCOUNTER — Telehealth: Payer: Self-pay

## 2017-10-16 NOTE — Telephone Encounter (Signed)
EMMI Follow-up: Noted on the report patient wasn't sure he received discharge papers and if he had follow-up appointments. Talked with Chris Wilkinson and he said he did have his discharge instructions with follow-up appointments listed and said someone had just called to schedule another follow-up appointment.  He is concerned about being able to afford all these new MD appts. No other needs noted at this time.

## 2017-10-16 NOTE — Progress Notes (Signed)
Hematology/Oncology Consult note [facility]  [phone] [phone]  Patient Care Team: Danelle Berry, NP as PCP - General (Nurse Practitioner)   Name of the patient: Chris Wilkinson  [id number]  February 04, 1941   Date of visit: 10/16/17  Diagnosis-SCC of RUL Stage IIIB T1N3M0   Chief complaint/ Reason for visit-routine f/u of lung cancer  Heme/Onc history:1. Patient is a 77 year old African-American male with a past medical history significant for COPD for which she sees Dr. [place]rey Rolls. He was recently admitted to the hospital in March 2018 with symptoms of pneumonia. CT thorax at that time showed: Perihilar lung mass with associated postobstructive atelectasis and consolidation of the right upper lobe  2. This was followed by a PET CT scan on 10/10/2016 which showed:1. Hypermetabolic RIGHT hilar mass with postobstructive collapse of the RIGHT upper lobe consistent with primary or metastatic bronchogenic carcinoma. 2. Hypermetabolic RIGHT paratracheal, prevascular and RIGHT supraclavicular nodal metastasis. 3. RIGHT supraclavicular node is intensely metabolic but likely too small for biopsy. 4. RIGHT lower lobe peripheral nodular thickening with associated metabolic activity is likely inflammatory. 5. Severe bullous change in the LEFT hemithorax  3. CT abdomen from 10/10/2016 showed no evidence of metastatic disease.  4. 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 Patient has problems with bilateral lower extremity swelling as well as shortness of breath on minimal exertion. He uses nighttime oxygen. He follows up with Dr. Floyce Stakes pulmonary as well as cardiology  6. Concurrent chemo/RT started on 11/13/16. Patient completed 5 weekly cycles of chemo/RT. He then had a gap of 3 weeks and restarted radiation boost for 3  weeks with concurrent chemo/RT. Interim scans after 5 weekly cycles showed partial response to treatment  7. Maintenance durvalumab started in august 2018 and patient has received 4 doses. He then presented with symptoms of SOB. CT showed evidence of pneumonitis- radiation induced versus durvalumab related. Durvalumab was stopped at that time and patient gets surveillance imaging and on observation only   Interval history- Patient was last seen by me in jan 2019 and plan was to repeat CT scan in 3 months. However patient was admitted to the hospital for worsening SOB and underwent 2 CT scans of chest in feb 2019. He was treated for COPD exacerbation and discharged. He is currently on steroids. He also has home O2 which he uses mainly at night and as needed during the day. He does feel sob on minimal exertion. He follows up with Dr. Humphrey Rolls for this. He reports chronic back pain. Appetite is fair and his weight has been stable  ECOG PS- 2 Pain scale- 0   Review of systems- Review of Systems  Constitutional: Positive for malaise/fatigue. Negative for chills, fever and weight loss.  HENT: Negative for congestion, ear discharge and nosebleeds.   Eyes: Negative for blurred vision.  Respiratory: Positive for shortness of breath. Negative for cough, hemoptysis, sputum production and wheezing.   Cardiovascular: Negative for chest pain, palpitations, orthopnea and claudication.  Gastrointestinal: Negative for abdominal pain, blood in stool, constipation, diarrhea, heartburn, melena, nausea and vomiting.  Genitourinary: Negative for dysuria, flank pain, frequency, hematuria and urgency.  Musculoskeletal: Negative for back pain, joint pain and myalgias.  Skin: Negative for rash.  Neurological: Negative for dizziness, tingling, focal weakness, seizures, weakness and headaches.  Endo/Heme/Allergies: Does not bruise/bleed easily.  Psychiatric/Behavioral: Negative for depression and suicidal ideas.  The  patient does not have insomnia.       No Known Allergies   Past Medical History:  Diagnosis Date  . Cancer ([place])    Lung CA  . CHF (congestive heart failure) ([place])   . COPD (chronic obstructive pulmonary disease) ([place])   . Coronary artery disease   . Diabetes mellitus without complication ([place])   . Fibromyalgia   . GERD (gastroesophageal reflux disease)   . Hypertension   . Pulmonary emboli [facility])      Past Surgical History:  Procedure Laterality Date  . COLONOSCOPY WITH PROPOFOL N/A 01/18/2015   Procedure: COLONOSCOPY WITH PROPOFOL;  Surgeon: Manya Silvas, MD;  Location: [facility] ENDOSCOPY;  Service: Endoscopy;  Laterality: N/A;  . ESOPHAGOGASTRODUODENOSCOPY N/A 01/18/2015   Procedure: ESOPHAGOGASTRODUODENOSCOPY (EGD);  Surgeon: Manya Silvas, MD;  Location: [facility] ENDOSCOPY;  Service: Endoscopy;  Laterality: N/A;  . EYE SURGERY    . FLEXIBLE BRONCHOSCOPY N/A 10/22/2016   Procedure: FLEXIBLE BRONCHOSCOPY;  Surgeon: Allyne Gee, MD;  Location: [facility] ORS;  Service: Pulmonary;  Laterality: N/A;  . HERNIA REPAIR    . IR FLUORO GUIDE PORT INSERTION LEFT  11/07/2016  . REPAIR KNEE LIGAMENT    . TONSILLECTOMY      Social History   Socioeconomic History  . Marital status: Married    Spouse name: Not on file  . Number of children: Not on file  . Years of education: Not on file  . Highest education level: Not on file  Occupational History  . Occupation: retired  Scientific laboratory technician  . Financial resource strain: Not on file  . Food insecurity:    Worry: Not on file    Inability: Not on file  . Transportation needs:    Medical: Not on file    Non-medical: Not on file  Tobacco Use  . Smoking status: Former Smoker    Last attempt to quit: 12/01/2003    Years since quitting: 13.8  . Smokeless tobacco: Never Used  Substance and Sexual Activity  . Alcohol use: Not Currently    Alcohol/week: 1.2 oz    Types: 2 Shots of liquor per week    Frequency: Never    Comment: on the weeknd 2  shots  . Drug use: No  . Sexual activity: Not on file  Lifestyle  . Physical activity:    Days per week: Not on file    Minutes per session: Not on file  . Stress: Not on file  Relationships  . Social connections:    Talks on phone: Not on file    Gets together: Not on file    Attends religious service: Not on file    Active member of club or organization: Not on file    Attends meetings of clubs or organizations: Not on file    Relationship status: Not on file  . Intimate partner violence:    Fear of current or ex partner: Not on file    Emotionally abused: Not on file    Physically abused: Not on file    Forced sexual activity: Not on file  Other Topics Concern  . Not on file  Social History Narrative  . Not on file    Family History  Problem Relation Age of Onset  . Prostate cancer Father   . Prostate cancer Brother   . Diabetes Unknown        all 13 siblings  . Hypertension Unknown        all 13  siblings     Current Outpatient Medications:  .  acetaminophen (TYLENOL) 325 MG tablet, Take 2 tablets (650 mg total) by mouth every 6 (six) hours as needed for mild pain (or Fever >/= 101)., Disp: , Rfl:  .  amoxicillin-clavulanate (AUGMENTIN) 875-125 MG tablet, Take 1 tablet by mouth 2 (two) times daily., Disp: 20 tablet, Rfl: 0 .  apixaban (ELIQUIS) 5 MG TABS tablet, Take 1 tablet (5 mg total) by mouth 2 (two) times daily., Disp: 60 tablet, Rfl: 2 .  benzonatate (TESSALON) 100 MG capsule, Take 1 capsule by mouth 3 (three) times daily as needed., Disp: , Rfl:  .  doxycycline (DORYX) 100 MG EC tablet, Take 100 mg by mouth 2 (two) times daily., Disp: , Rfl:  .  ipratropium-albuterol (DUONEB) 0.5-2.5 (3) MG/3ML SOLN, Take 3 mLs by nebulization every 6 (six) hours as needed., Disp: 120 mL, Rfl: 3 .  methylPREDNISolone (MEDROL) 4 MG TBPK tablet, Take by mouth as directed for 6 days, Disp: 21 tablet, Rfl: 0 .  metoprolol succinate (TOPROL-XL) 50 MG 24 hr tablet, Take 50 mg by  mouth daily. Take with or immediately following a meal., Disp: , Rfl:  .  mometasone-formoterol (DULERA) 100-5 MCG/ACT AERO, Inhale 2 puffs into the lungs 2 times daily at 12 noon and 4 pm., Disp: 1 Inhaler, Rfl: 4 .  NONFORMULARY OR COMPOUNDED ITEM, Take 30 mLs by mouth every 6 (six) hours as needed for up to 2 doses. Mix Maalox + 2% Viscous Lidocaine + Donnatal in equal amounts. Take 30 ml PO q6hrs. Do not exceed 3 doses per day., Disp: 1 each, Rfl: 0 .  oxyCODONE (OXY IR/ROXICODONE) 5 MG immediate release tablet, Take 2 tablets (10 mg total) by mouth every 8 (eight) hours as needed for severe pain., Disp: 12 tablet, Rfl: 0 .  primidone (MYSOLINE) 50 MG tablet, Take 1 tablet by mouth daily., Disp: , Rfl:  .  simvastatin (ZOCOR) 10 MG tablet, Take 10 mg by mouth every evening. , Disp: , Rfl:  .  TRADJENTA 5 MG TABS tablet, Take 5 mg by mouth daily., Disp: , Rfl:  .  Vitamin D, Ergocalciferol, (DRISDOL) 50000 units CAPS capsule, Take 1 capsule by mouth every Thursday., Disp: , Rfl:  No current facility-administered medications for this visit.   Facility-Administered Medications Ordered in Other Visits:  .  sodium chloride flush (NS) 0.9 % injection 10 mL, 10 mL, Intravenous, PRN, Sindy Guadeloupe, MD, 10 mL at 01/30/17 0958  Physical exam:  Vitals:   10/15/17 1505 10/15/17 1641  BP: 116/78   Resp: 18   Temp: 98 F (36.7 C)   TempSrc: Tympanic   SpO2: 92%   Weight: 182 lb (82.6 kg) 195 lb (88.5 kg)  Height: _0  (1.803 m)    Physical Exam  Constitutional: He is oriented to person, place, and time and well-developed, well-nourished, and in no distress.  Sitting in a wheelchair. Appears in no acute distress  HENT:  Head: Normocephalic and atraumatic.  Eyes: Pupils are equal, round, and reactive to light. EOM are normal.  Neck: Normal range of motion.  Cardiovascular: Normal rate, regular rhythm and normal heart sounds.  Pulmonary/Chest: Effort normal and breath sounds normal.    Abdominal: Soft. Bowel sounds are normal.  Musculoskeletal: He exhibits edema (trace b/l).  Neurological: He is alert and oriented to person, place, and time.  Skin: Skin is warm and dry.     CMP Latest Ref Rng & Units 10/15/2017  Glucose  65 - 99 mg/dL 213(H)  BUN 6 - 20 mg/dL 23(H)  Creatinine 0.61 - 1.24 mg/dL 1.50(H)  Sodium 135 - 145 mmol/L 135  Potassium 3.5 - 5.1 mmol/L 4.2  Chloride 101 - 111 mmol/L 103  CO2 22 - 32 mmol/L 24  Calcium 8.9 - 10.3 mg/dL 9.7  Total Protein 6.5 - 8.1 g/dL 7.6  Total Bilirubin 0.3 - 1.2 mg/dL 0.4  Alkaline Phos 38 - 126 U/L 107  AST 15 - 41 U/L 18  ALT 17 - 63 U/L 23   CBC Latest Ref Rng & Units 10/15/2017  WBC 3.8 - 10.6 K/uL 8.6  Hemoglobin 13.0 - 18.0 g/dL 12.3(L)  Hematocrit 40.0 - 52.0 % 36.5(L)  Platelets 150 - 440 K/uL 175    No images are attached to the encounter.  Dg Chest Portable 1 View  Result Date: 10/10/2017 CLINICAL DATA:  77 y/o  M; shortness of breath. EXAM: PORTABLE CHEST 1 VIEW COMPARISON:  09/08/2017 chest radiograph.  09/07/2017 CT chest. FINDINGS: Stable cardiomediastinal silhouette with paramediastinal scarring/atelectasis. 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 Stable pulmonary nodule in the right suprahilar region better characterized on prior CT of chest. Left port catheter tip projects over lower SVC. No focal consolidation, effusion, or pneumothorax identified. Bones are unremarkable. IMPRESSION: 1. No acute pulmonary process identified. 2. 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 3. Right suprahilar pulmonary nodule better characterized on prior CT of chest. Electronically Signed   By: Kristine Garbe M.D.   On: 10/10/2017 01:36     Assessment and plan- Patient is a 77 y.o. male Stage IIIB T1N3M0. He has completed concurrent chemo/RT with partial responseand 4 cycles of maintenance durvalumab currently on observation due to concern for pneumonitis    I have  personally reviewed CT thorax images from feb 2019 independently. We also looked at the images at tumor board today. This CT was done in the ER and I was not aware of the results. The RUL nodule that was new and seen on CT in Jan appears larger at 1.3 cm in February concerning for recurrence.  Patient has severe emphysema atleast based on imaging and high risk of pneumothorax with repeat biopsy which I do not think is needed at this point. Dr. Baruch Gouty is willing to see him and consider SBRT for this lesion.  I will see him back in 2 months post RT. Repeat cbc, cmp and CT chest abdomen pelvis with contrast and decide about further management based on scans  He will continue to be on eliquis for his PE.Patients chronic hypoxic failure is more due to underlying chronic lung disease than lung cancer.     Visit Diagnosis 1. Malignant neoplasm of lung, unspecified laterality, unspecified part of lung ([place])      Dr. Randa Evens, MD, MPH [facility] [phone] 10/16/2017 12:14 PM

## 2017-10-21 ENCOUNTER — Ambulatory Visit: Payer: Medicare Other | Attending: Pain Medicine | Admitting: Pain Medicine

## 2017-10-26 ENCOUNTER — Ambulatory Visit
Admission: RE | Admit: 2017-10-26 | Discharge: 2017-10-26 | Disposition: A | Discharge status: Home / Self Care | Payer: Medicare Other | Source: Ambulatory Visit | Attending: Radiation Oncology | Admitting: Radiation Oncology

## 2017-10-26 ENCOUNTER — Other Ambulatory Visit: Payer: Self-pay

## 2017-10-26 ENCOUNTER — Ambulatory Visit: Payer: Self-pay | Admitting: Internal Medicine

## 2017-10-26 ENCOUNTER — Encounter: Payer: Self-pay | Admitting: Radiation Oncology

## 2017-10-26 VITALS — BP 114/70 | HR 120 | Temp 100.2°F | Resp 20 | Wt 195.8 lb

## 2017-10-26 DIAGNOSIS — Z9981 Dependence on supplemental oxygen: Secondary | ICD-10-CM | POA: Insufficient documentation

## 2017-10-26 DIAGNOSIS — Z9221 Personal history of antineoplastic chemotherapy: Secondary | ICD-10-CM | POA: Insufficient documentation

## 2017-10-26 DIAGNOSIS — J439 Emphysema, unspecified: Secondary | ICD-10-CM | POA: Diagnosis not present

## 2017-10-26 DIAGNOSIS — Z993 Dependence on wheelchair: Secondary | ICD-10-CM | POA: Diagnosis not present

## 2017-10-26 DIAGNOSIS — Z87891 Personal history of nicotine dependence: Secondary | ICD-10-CM | POA: Diagnosis not present

## 2017-10-26 DIAGNOSIS — C7801 Secondary malignant neoplasm of right lung: Secondary | ICD-10-CM | POA: Diagnosis not present

## 2017-10-26 DIAGNOSIS — Z923 Personal history of irradiation: Secondary | ICD-10-CM | POA: Insufficient documentation

## 2017-10-26 DIAGNOSIS — C3401 Malignant neoplasm of right main bronchus: Secondary | ICD-10-CM | POA: Insufficient documentation

## 2017-10-26 DIAGNOSIS — C3491 Malignant neoplasm of unspecified part of right bronchus or lung: Secondary | ICD-10-CM

## 2017-10-26 NOTE — Progress Notes (Signed)
Radiation Oncology Follow up Note  Name: Chris Wilkinson   Date:   10/26/2017 MRN:  [id number] DOB: 25-Feb-1941    This 77 y.o. male presents to the clinic todayall patient new area new right upper lobe lesion inpatient previously treated to his right hilum for stage IIIB squamous cell carcinoma  REFERRING PROVIDER: Danelle Berry, NP  HPI: patient is a 77 year old male previous treated approximate 9 months prior with concurrent chemoradiation for a T3 N3 M0 squamous cell carcinoma the right lung hilum. He has extremely poorlung function based on significant emphysematous lung disease. He was noted in February to have a new nodule present in the.ight upper lobe progressing in size consistent with progressive lung cancer. 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 His films were reviewed at our case conference and based on progressive nature of the right upper lobe lesion measuring 1.3 x 1.1 cm. It was decided case conference this is lung cancer although it would be's extremely dangerous to biopsy this patient with such significant emphysematous lung disease. I recommended SB RT to this lesion. He is seen today for radiation oncology opinion. He is on continuous nasal oxygen wheelchair-bound. He has a mild slightly productive cough no hemoptysis.    COMPLICATIONS OF TREATMENT: none  FOLLOW UP COMPLIANCE: keeps appointments   PHYSICAL EXAM:  BP 114/70   Pulse (!) 120   Temp 100.2 F (37.9 C)   Resp 20   Wt 195 lb 12.3 oz (88.8 kg)   BMI 27.30 kg/m  At well-developed male wheelchair-bound with nasal oxygen in NAD. Well-developed well-nourished patient in NAD. HEENT reveals PERLA, EOMI, discs not visualized.  Oral cavity is clear. No oral mucosal lesions are identified. Neck is clear without evidence of cervical or supraclavicular adenopathy. Lungs are clear to A&P. Cardiac examination is  essentially unremarkable with regular rate and rhythm without murmur rub or thrill. Abdomen is benign with no organomegaly or masses noted. Motor sensory and DTR levels are equal and symmetric in the upper and lower extremities. Cranial nerves II through XII are grossly intact. Proprioception is intact. No peripheral adenopathy or edema is identified. No motor or sensory levels are noted. Crude visual fields are within normal range.  RADIOLOGY RESULTS: CT scans are reviewed and compatible with the above-stated findings  PLAN: this time like to ahead with SB RT. Would plan on delivering 6000 cGy in 5 fractions using4D study and motion restriction protocol. Risks and benefits of treatment including increased cough fatigue possible radiation esophagitis all were discussed in detail with the patient and his wife. They both seem to comprehend my treatment plan well. I have personally ordered CT simulation for next week.  I would like to take this opportunity to thank you for allowing me to participate in the care of your patient.Noreene Filbert, MD

## 2017-11-03 ENCOUNTER — Ambulatory Visit
Admission: RE | Admit: 2017-11-03 | Discharge: 2017-11-03 | Disposition: A | Discharge status: Home / Self Care | Payer: Medicare Other | Source: Ambulatory Visit | Attending: Radiation Oncology | Admitting: Radiation Oncology

## 2017-11-03 DIAGNOSIS — C3491 Malignant neoplasm of unspecified part of right bronchus or lung: Secondary | ICD-10-CM | POA: Insufficient documentation

## 2017-11-03 DIAGNOSIS — Z510 Encounter for antineoplastic radiation therapy: Secondary | ICD-10-CM | POA: Diagnosis not present

## 2017-11-04 DIAGNOSIS — R0602 Shortness of breath: Secondary | ICD-10-CM | POA: Insufficient documentation

## 2017-11-04 DIAGNOSIS — R059 Cough, unspecified: Secondary | ICD-10-CM | POA: Insufficient documentation

## 2017-11-04 DIAGNOSIS — R05 Cough: Secondary | ICD-10-CM | POA: Insufficient documentation

## 2017-11-13 ENCOUNTER — Other Ambulatory Visit: Payer: Self-pay

## 2017-11-13 ENCOUNTER — Encounter: Payer: Self-pay | Admitting: Radiology

## 2017-11-13 ENCOUNTER — Inpatient Hospital Stay
Admission: EM | Admit: 2017-11-13 | Discharge: 2017-11-16 | DRG: 193 | Disposition: A | Discharge status: Home / Self Care | Payer: Medicare Other | Attending: Internal Medicine | Admitting: Internal Medicine

## 2017-11-13 ENCOUNTER — Emergency Department: Payer: Medicare Other

## 2017-11-13 DIAGNOSIS — Y95 Nosocomial condition: Secondary | ICD-10-CM | POA: Diagnosis present

## 2017-11-13 DIAGNOSIS — I2510 Atherosclerotic heart disease of native coronary artery without angina pectoris: Secondary | ICD-10-CM | POA: Diagnosis present

## 2017-11-13 DIAGNOSIS — Z8619 Personal history of other infectious and parasitic diseases: Secondary | ICD-10-CM

## 2017-11-13 DIAGNOSIS — R599 Enlarged lymph nodes, unspecified: Secondary | ICD-10-CM | POA: Diagnosis not present

## 2017-11-13 DIAGNOSIS — Z85118 Personal history of other malignant neoplasm of bronchus and lung: Secondary | ICD-10-CM | POA: Diagnosis not present

## 2017-11-13 DIAGNOSIS — C3491 Malignant neoplasm of unspecified part of right bronchus or lung: Secondary | ICD-10-CM | POA: Diagnosis present

## 2017-11-13 DIAGNOSIS — Z9089 Acquired absence of other organs: Secondary | ICD-10-CM

## 2017-11-13 DIAGNOSIS — M797 Fibromyalgia: Secondary | ICD-10-CM | POA: Diagnosis present

## 2017-11-13 DIAGNOSIS — Z66 Do not resuscitate: Secondary | ICD-10-CM | POA: Diagnosis present

## 2017-11-13 DIAGNOSIS — M47812 Spondylosis without myelopathy or radiculopathy, cervical region: Secondary | ICD-10-CM | POA: Diagnosis present

## 2017-11-13 DIAGNOSIS — Z7951 Long term (current) use of inhaled steroids: Secondary | ICD-10-CM

## 2017-11-13 DIAGNOSIS — I10 Essential (primary) hypertension: Secondary | ICD-10-CM | POA: Diagnosis not present

## 2017-11-13 DIAGNOSIS — M19011 Primary osteoarthritis, right shoulder: Secondary | ICD-10-CM | POA: Diagnosis present

## 2017-11-13 DIAGNOSIS — N189 Chronic kidney disease, unspecified: Secondary | ICD-10-CM | POA: Diagnosis present

## 2017-11-13 DIAGNOSIS — R000 Tachycardia, unspecified: Secondary | ICD-10-CM | POA: Diagnosis present

## 2017-11-13 DIAGNOSIS — R0602 Shortness of breath: Secondary | ICD-10-CM | POA: Diagnosis not present

## 2017-11-13 DIAGNOSIS — G894 Chronic pain syndrome: Secondary | ICD-10-CM

## 2017-11-13 DIAGNOSIS — Z8042 Family history of malignant neoplasm of prostate: Secondary | ICD-10-CM

## 2017-11-13 DIAGNOSIS — Z902 Acquired absence of lung [part of]: Secondary | ICD-10-CM

## 2017-11-13 DIAGNOSIS — J189 Pneumonia, unspecified organism: Secondary | ICD-10-CM | POA: Diagnosis present

## 2017-11-13 DIAGNOSIS — Z9981 Dependence on supplemental oxygen: Secondary | ICD-10-CM | POA: Diagnosis not present

## 2017-11-13 DIAGNOSIS — J441 Chronic obstructive pulmonary disease with (acute) exacerbation: Secondary | ICD-10-CM | POA: Diagnosis present

## 2017-11-13 DIAGNOSIS — G893 Neoplasm related pain (acute) (chronic): Secondary | ICD-10-CM

## 2017-11-13 DIAGNOSIS — I5032 Chronic diastolic (congestive) heart failure: Secondary | ICD-10-CM | POA: Diagnosis present

## 2017-11-13 DIAGNOSIS — I248 Other forms of acute ischemic heart disease: Secondary | ICD-10-CM | POA: Diagnosis present

## 2017-11-13 DIAGNOSIS — I214 Non-ST elevation (NSTEMI) myocardial infarction: Secondary | ICD-10-CM

## 2017-11-13 DIAGNOSIS — E1122 Type 2 diabetes mellitus with diabetic chronic kidney disease: Secondary | ICD-10-CM | POA: Diagnosis present

## 2017-11-13 DIAGNOSIS — J9621 Acute and chronic respiratory failure with hypoxia: Secondary | ICD-10-CM | POA: Diagnosis present

## 2017-11-13 DIAGNOSIS — Z9221 Personal history of antineoplastic chemotherapy: Secondary | ICD-10-CM

## 2017-11-13 DIAGNOSIS — R05 Cough: Secondary | ICD-10-CM | POA: Diagnosis not present

## 2017-11-13 DIAGNOSIS — Z8701 Personal history of pneumonia (recurrent): Secondary | ICD-10-CM

## 2017-11-13 DIAGNOSIS — Z833 Family history of diabetes mellitus: Secondary | ICD-10-CM

## 2017-11-13 DIAGNOSIS — Z87891 Personal history of nicotine dependence: Secondary | ICD-10-CM

## 2017-11-13 DIAGNOSIS — M5030 Other cervical disc degeneration, unspecified cervical region: Secondary | ICD-10-CM | POA: Diagnosis present

## 2017-11-13 DIAGNOSIS — Z86711 Personal history of pulmonary embolism: Secondary | ICD-10-CM | POA: Diagnosis not present

## 2017-11-13 DIAGNOSIS — J440 Chronic obstructive pulmonary disease with acute lower respiratory infection: Secondary | ICD-10-CM | POA: Diagnosis present

## 2017-11-13 DIAGNOSIS — I130 Hypertensive heart and chronic kidney disease with heart failure and stage 1 through stage 4 chronic kidney disease, or unspecified chronic kidney disease: Secondary | ICD-10-CM | POA: Diagnosis present

## 2017-11-13 DIAGNOSIS — K219 Gastro-esophageal reflux disease without esophagitis: Secondary | ICD-10-CM | POA: Diagnosis present

## 2017-11-13 DIAGNOSIS — Z86010 Personal history of colonic polyps: Secondary | ICD-10-CM

## 2017-11-13 DIAGNOSIS — J449 Chronic obstructive pulmonary disease, unspecified: Secondary | ICD-10-CM | POA: Diagnosis not present

## 2017-11-13 DIAGNOSIS — Z79891 Long term (current) use of opiate analgesic: Secondary | ICD-10-CM

## 2017-11-13 DIAGNOSIS — J90 Pleural effusion, not elsewhere classified: Secondary | ICD-10-CM | POA: Diagnosis not present

## 2017-11-13 DIAGNOSIS — E119 Type 2 diabetes mellitus without complications: Secondary | ICD-10-CM | POA: Diagnosis not present

## 2017-11-13 DIAGNOSIS — Z7901 Long term (current) use of anticoagulants: Secondary | ICD-10-CM

## 2017-11-13 DIAGNOSIS — Z79899 Other long term (current) drug therapy: Secondary | ICD-10-CM

## 2017-11-13 DIAGNOSIS — Z8249 Family history of ischemic heart disease and other diseases of the circulatory system: Secondary | ICD-10-CM

## 2017-11-13 LAB — COMPREHENSIVE METABOLIC PANEL
ALT: 18 U/L (ref 17–63)
ANION GAP: 7 (ref 5–15)
AST: 26 U/L (ref 15–41)
Albumin: 3.1 g/dL — ABNORMAL LOW (ref 3.5–5.0)
Alkaline Phosphatase: 95 U/L (ref 38–126)
BUN: 15 mg/dL (ref 6–20)
CHLORIDE: 101 mmol/L (ref 101–111)
CO2: 27 mmol/L (ref 22–32)
Calcium: 9.5 mg/dL (ref 8.9–10.3)
Creatinine, Ser: 1.44 mg/dL — ABNORMAL HIGH (ref 0.61–1.24)
GFR calc Af Amer: 53 mL/min — ABNORMAL LOW (ref >60)
GFR, EST NON AFRICAN AMERICAN: 46 mL/min — AB (ref >60)
Glucose, Bld: 148 mg/dL — ABNORMAL HIGH (ref 65–99)
POTASSIUM: 4.9 mmol/L (ref 3.5–5.1)
Sodium: 135 mmol/L (ref 135–145)
TOTAL PROTEIN: 8.9 g/dL — AB (ref 6.5–8.1)
Total Bilirubin: 0.9 mg/dL (ref 0.3–1.2)

## 2017-11-13 LAB — CBC
HEMATOCRIT: 40.2 % (ref 40.0–52.0)
Hemoglobin: 13.2 g/dL (ref 13.0–18.0)
MCH: 28.7 pg (ref 26.0–34.0)
MCHC: 32.9 g/dL (ref 32.0–36.0)
MCV: 87.3 fL (ref 80.0–100.0)
PLATELETS: 200 10*3/uL (ref 150–440)
RBC: 4.61 MIL/uL (ref 4.40–5.90)
RDW: 15.7 % — AB (ref 11.5–14.5)
WBC: 7.1 10*3/uL (ref 3.8–10.6)

## 2017-11-13 LAB — HEMOGLOBIN A1C
Hgb A1c MFr Bld: 7.1 % — ABNORMAL HIGH (ref 4.8–5.6)
Mean Plasma Glucose: 157.07 mg/dL

## 2017-11-13 LAB — TROPONIN I: TROPONIN I: 0.33 ng/mL — AB (ref <0.03)

## 2017-11-13 LAB — GLUCOSE, CAPILLARY
Glucose-Capillary: 168 mg/dL — ABNORMAL HIGH (ref 65–99)
Glucose-Capillary: 195 mg/dL — ABNORMAL HIGH (ref 65–99)

## 2017-11-13 LAB — MRSA PCR SCREENING: MRSA BY PCR: NEGATIVE

## 2017-11-13 LAB — LACTIC ACID, PLASMA: LACTIC ACID, VENOUS: 1.7 mmol/L (ref 0.5–1.9)

## 2017-11-13 MED ORDER — IPRATROPIUM-ALBUTEROL 0.5-2.5 (3) MG/3ML IN SOLN
3.0000 mL | Freq: Once | RESPIRATORY_TRACT | Status: AC
  Administered 2017-11-13: 3 mL via RESPIRATORY_TRACT
  Filled 2017-11-13: qty 3

## 2017-11-13 MED ORDER — APIXABAN 5 MG PO TABS
5.0000 mg | ORAL_TABLET | Freq: Two times a day (BID) | ORAL | Status: DC
  Administered 2017-11-13 – 2017-11-16 (×6): 5 mg via ORAL
  Filled 2017-11-13 (×6): qty 1

## 2017-11-13 MED ORDER — VANCOMYCIN HCL 10 G IV SOLR
1500.0000 mg | INTRAVENOUS | Status: DC
  Administered 2017-11-13 – 2017-11-15 (×3): 1500 mg via INTRAVENOUS
  Filled 2017-11-13 (×5): qty 1500

## 2017-11-13 MED ORDER — ONDANSETRON HCL 4 MG/2ML IJ SOLN: 4.0000 mg | Freq: Four times a day (QID) | INTRAMUSCULAR | Status: DC | PRN

## 2017-11-13 MED ORDER — IOPAMIDOL (ISOVUE-370) INJECTION 76%
75.0000 mL | Freq: Once | INTRAVENOUS | Status: AC | PRN
  Administered 2017-11-13: 75 mL via INTRAVENOUS

## 2017-11-13 MED ORDER — METHYLPREDNISOLONE SODIUM SUCC 125 MG IJ SOLR
60.0000 mg | Freq: Two times a day (BID) | INTRAMUSCULAR | Status: DC
Start: 2017-11-13 — End: 2017-11-14
  Administered 2017-11-13 – 2017-11-14 (×2): 60 mg via INTRAVENOUS
  Filled 2017-11-13 (×2): qty 2

## 2017-11-13 MED ORDER — VANCOMYCIN HCL IN DEXTROSE 1-5 GM/200ML-% IV SOLN
1000.0000 mg | Freq: Once | INTRAVENOUS | Status: AC
  Administered 2017-11-13: 1000 mg via INTRAVENOUS
  Filled 2017-11-13: qty 200

## 2017-11-13 MED ORDER — LINAGLIPTIN 5 MG PO TABS
5.0000 mg | ORAL_TABLET | Freq: Every day | ORAL | Status: DC
  Administered 2017-11-13 – 2017-11-16 (×4): 5 mg via ORAL
  Filled 2017-11-13 (×4): qty 1

## 2017-11-13 MED ORDER — SIMVASTATIN 20 MG PO TABS
10.0000 mg | ORAL_TABLET | Freq: Every evening | ORAL | Status: DC
  Administered 2017-11-13 – 2017-11-15 (×3): 10 mg via ORAL
  Filled 2017-11-13 (×3): qty 1

## 2017-11-13 MED ORDER — METOPROLOL SUCCINATE ER 50 MG PO TB24
50.0000 mg | ORAL_TABLET | Freq: Every day | ORAL | Status: DC
  Administered 2017-11-13 – 2017-11-16 (×4): 50 mg via ORAL
  Filled 2017-11-13 (×4): qty 1

## 2017-11-13 MED ORDER — SODIUM CHLORIDE 0.9 % IV BOLUS
1000.0000 mL | Freq: Once | INTRAVENOUS | Status: AC
  Administered 2017-11-13: 1000 mL via INTRAVENOUS

## 2017-11-13 MED ORDER — OXYCODONE HCL 5 MG PO TABS: 10.0000 mg | ORAL_TABLET | Freq: Three times a day (TID) | ORAL | Status: DC | PRN

## 2017-11-13 MED ORDER — BENZONATATE 100 MG PO CAPS
100.0000 mg | ORAL_CAPSULE | Freq: Three times a day (TID) | ORAL | Status: DC | PRN
Start: 2017-11-13 — End: 2017-11-16
  Administered 2017-11-15: 100 mg via ORAL
  Filled 2017-11-13: qty 1

## 2017-11-13 MED ORDER — AMIODARONE HCL 200 MG PO TABS
200.0000 mg | ORAL_TABLET | Freq: Every day | ORAL | Status: DC
  Administered 2017-11-13 – 2017-11-16 (×4): 200 mg via ORAL
  Filled 2017-11-13 (×4): qty 1

## 2017-11-13 MED ORDER — ALBUTEROL SULFATE (2.5 MG/3ML) 0.083% IN NEBU: 2.5000 mg | INHALATION_SOLUTION | RESPIRATORY_TRACT | Status: DC | PRN

## 2017-11-13 MED ORDER — IPRATROPIUM-ALBUTEROL 0.5-2.5 (3) MG/3ML IN SOLN
3.0000 mL | Freq: Four times a day (QID) | RESPIRATORY_TRACT | Status: DC
  Administered 2017-11-13 – 2017-11-14 (×5): 3 mL via RESPIRATORY_TRACT
  Filled 2017-11-13 (×5): qty 3

## 2017-11-13 MED ORDER — ACETAMINOPHEN 650 MG RE SUPP: 650.0000 mg | Freq: Four times a day (QID) | RECTAL | Status: DC | PRN

## 2017-11-13 MED ORDER — CEFEPIME HCL 1 G IJ SOLR
1.0000 g | Freq: Once | INTRAMUSCULAR | Status: AC
  Administered 2017-11-13: 1 g via INTRAVENOUS
  Filled 2017-11-13: qty 1

## 2017-11-13 MED ORDER — POLYETHYLENE GLYCOL 3350 17 G PO PACK: 17.0000 g | PACK | Freq: Every day | ORAL | Status: DC | PRN

## 2017-11-13 MED ORDER — ACETAMINOPHEN 325 MG PO TABS
650.0000 mg | ORAL_TABLET | Freq: Four times a day (QID) | ORAL | Status: DC | PRN
Start: 2017-11-13 — End: 2017-11-16

## 2017-11-13 MED ORDER — SODIUM CHLORIDE 0.9 % IV SOLN
2.0000 g | Freq: Two times a day (BID) | INTRAVENOUS | Status: DC
  Administered 2017-11-13 – 2017-11-16 (×5): 2 g via INTRAVENOUS
  Filled 2017-11-13 (×7): qty 2

## 2017-11-13 MED ORDER — METHYLPREDNISOLONE SODIUM SUCC 125 MG IJ SOLR
125.0000 mg | Freq: Once | INTRAMUSCULAR | Status: AC
  Administered 2017-11-13: 125 mg via INTRAVENOUS
  Filled 2017-11-13: qty 2

## 2017-11-13 MED ORDER — INSULIN ASPART 100 UNIT/ML ~~LOC~~ SOLN: 0.0000 [IU] | Freq: Every day | SUBCUTANEOUS | Status: DC

## 2017-11-13 MED ORDER — MOMETASONE FURO-FORMOTEROL FUM 100-5 MCG/ACT IN AERO
2.0000 | INHALATION_SPRAY | Freq: Two times a day (BID) | RESPIRATORY_TRACT | Status: DC
  Administered 2017-11-13 – 2017-11-15 (×5): 2 via RESPIRATORY_TRACT
  Filled 2017-11-13: qty 8.8

## 2017-11-13 MED ORDER — ONDANSETRON HCL 4 MG PO TABS: 4.0000 mg | ORAL_TABLET | Freq: Four times a day (QID) | ORAL | Status: DC | PRN

## 2017-11-13 MED ORDER — PRIMIDONE 50 MG PO TABS
50.0000 mg | ORAL_TABLET | Freq: Every day | ORAL | Status: DC
  Administered 2017-11-13 – 2017-11-16 (×4): 50 mg via ORAL
  Filled 2017-11-13 (×4): qty 1

## 2017-11-13 MED ORDER — INSULIN ASPART 100 UNIT/ML ~~LOC~~ SOLN
0.0000 [IU] | Freq: Three times a day (TID) | SUBCUTANEOUS | Status: DC
  Administered 2017-11-13: 3 [IU] via SUBCUTANEOUS
  Administered 2017-11-14: 2 [IU] via SUBCUTANEOUS
  Administered 2017-11-14: 3 [IU] via SUBCUTANEOUS
  Administered 2017-11-14 – 2017-11-15 (×3): 2 [IU] via SUBCUTANEOUS
  Administered 2017-11-15: 3 [IU] via SUBCUTANEOUS
  Administered 2017-11-16: 2 [IU] via SUBCUTANEOUS
  Filled 2017-11-13 (×8): qty 1

## 2017-11-13 MED ORDER — ASPIRIN 81 MG PO CHEW
324.0000 mg | CHEWABLE_TABLET | Freq: Once | ORAL | Status: AC
  Administered 2017-11-13: 324 mg via ORAL
  Filled 2017-11-13: qty 4

## 2017-11-13 NOTE — ED Notes (Signed)
Spoke with Dr. Darvin Neighbours, per Dr. Darvin Neighbours no off unit telemetry needed for pt

## 2017-11-13 NOTE — ED Notes (Signed)
Eliot Ford, EDT to take pt to the floor.

## 2017-11-13 NOTE — Progress Notes (Signed)
Pharmacy Antibiotic Note  Chris Wilkinson is a 77 y.o. male admitted on 11/13/2017 with pneumonia/HCAP.  Pharmacy has been consulted for vancomycin and cefepime dosing. He has several recent admissions, most recently for a COPD exacerbation. There is no history of sustained vancomycin therapy to guide dosing beyond calculations.  Plan: Vancomycin 1000mg  was given in the ED. Stacked dose interval of 6 hours. Will begin at 1500mg  vancomycin IV every 24 hours for predicted steady-state concentrations of 45/17 mcg/ml. Goal trough 15-20 mcg/mL. Vt will be ordered prior to the 4th scheduled dose.  Ke: 0.043 T1/2: 16h Vd: 62L  Cefepime dose renally adjusted for HCAP at 2gram q12h  Height: 5\' 11"  (180.3 cm) Weight: 195 lb (88.5 kg) IBW/kg (Calculated) : 75.3  Temp (24hrs), Avg:99.7 F (37.6 C), Min:99.7 F (37.6 C), Max:99.7 F (37.6 C)  Recent Labs  Lab 11/13/17 0959 11/13/17 1058  WBC 7.1  --   CREATININE 1.44*  --   LATICACIDVEN  --  1.7    Estimated Creatinine Clearance: 47.2 mL/min (A) (by C-G formula based on SCr of 1.44 mg/dL (H)).    No Known Allergies  Antimicrobials this admission: Vancomycin 5/3 >>  Cefepime 5/3 >>  Microbiology results: 5/2 BCx:  5/3 Sputum:  5/3 MRSA PCR:   Thank you for allowing pharmacy to be a part of this patient's care.  Dallie Piles 11/13/2017 2:28 PM

## 2017-11-13 NOTE — ED Notes (Signed)
Dr. Darvin Neighbours paged to see if he wants patient on off unit telemetry since he is still tachycardic.

## 2017-11-13 NOTE — ED Triage Notes (Signed)
Pt arrives via pov from home, pt started having sob last night and had to increase his O2 to 5L. Pt has copd and is a cancer survivor.

## 2017-11-13 NOTE — ED Provider Notes (Signed)
[facility] Emergency Department Provider Note  ____________________________________________  Time seen: Approximately 10:43 AM  I have reviewed the triage vital signs and the nursing notes.   HISTORY  Chief Complaint Shortness of Breath   HPI Chris Wilkinson is a 77 y.o. male with a history of lung cancer not currently on chemoradiation, CHF with a EF of 55 to 60%, CAD, COPD on 3 L [place], diabetes, hypertension, PE on eliquis who presents for evaluation of shortness of breath.  Patient reports chronic shortness of breath which started to get worse yesterday evening.  He has had a cough that is productive of clear sputum.  He denies fever or chills, he denies chest pain, he denies nausea, vomiting, or diarrhea.  He endorses compliance with his Eliquis.  He denies leg pain or swelling.  Patient reports that his shortness of breath became constant and severe this morning.  Patient had to increase his oxygen from 3 to 5 L. he reports that his breathing treatments were helping just a little bit.  He is supposed to start radiation therapy next week.  Past Medical History:  Diagnosis Date  . Cancer ([place])    Lung CA  . CHF (congestive heart failure) ([place])   . COPD (chronic obstructive pulmonary disease) ([place])   . Coronary artery disease   . Diabetes mellitus without complication ([place])   . Fibromyalgia   . GERD (gastroesophageal reflux disease)   . Hypertension   . Pulmonary emboli [facility])     Patient Active Problem List   Diagnosis Date Noted  . SOB (shortness of breath) 11/04/2017  . Cough 11/04/2017  . COPD exacerbation ([place]) 10/10/2017  . Cervicalgia 09/30/2017  . Tendinitis of rotator cuff (Right) 09/30/2017  . DJD of AC (acromioclavicular) joint (Right) 09/30/2017  . 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 DDD (degenerative disc disease), cervical 09/30/2017  . Spondylosis without myelopathy or radiculopathy, cervical region 09/30/2017  .  Atypical facial pain (Right) 09/30/2017  . Problems influencing health status 09/30/2017  . Abnormal MRI, shoulder (08/20/2011) (Right) 09/30/2017  . Cancer related pain 09/30/2017  . Pain after radiation therapy 09/30/2017  . Elevated sed rate 09/30/2017  . Elevated C-reactive protein (CRP) 09/30/2017  . Pneumothorax 09/07/2017  . Chronic throat pain (Primary Area of Pain) 08/12/2017  . Chronic secondary facial pain (Secondary Area of Pain) (Right) 08/12/2017  . Chronic neck pain (Tertiary Area of Pain) 08/12/2017  . Chronic pain syndrome 08/12/2017  . Other long term (current) drug therapy 08/12/2017  . Disorder of skeletal system 08/12/2017  . Other specified health status 08/12/2017  . Long term current use of opiate analgesic 08/12/2017  . Encounter for antineoplastic immunotherapy 02/27/2017  . Carpal tunnel syndrome 12/04/2016  . Sprain of wrist 12/04/2016  . Pharmacologic therapy 11/20/2016  . Lung cancer ([place]) 11/04/2016  . HCAP (healthcare-associated pneumonia) 09/22/2016  . Sepsis ([place]) 08/04/2015  . CAP (community acquired pneumonia) 08/04/2015  . Acute bronchitis with COPD ([place]) 08/04/2015  . Type 2 diabetes mellitus ([place]) 08/04/2015  . CAD (coronary artery disease) 08/04/2015  . HTN (hypertension) 08/04/2015  . GERD (gastroesophageal reflux disease) 08/04/2015  . Bloating 12/04/2014  . Gas 12/04/2014  . Hx of adenomatous polyp of colon 12/04/2014    Past Surgical History:  Procedure Laterality Date  . COLONOSCOPY WITH PROPOFOL N/A 01/18/2015   Procedure: COLONOSCOPY WITH PROPOFOL;  Surgeon: Manya Silvas, MD;  Location: [facility] ENDOSCOPY;  Service: Endoscopy;  Laterality: N/A;  . ESOPHAGOGASTRODUODENOSCOPY N/A  01/18/2015   Procedure: ESOPHAGOGASTRODUODENOSCOPY (EGD);  Surgeon: Manya Silvas, MD;  Location: [facility] ENDOSCOPY;  Service: Endoscopy;  Laterality: N/A;  . EYE SURGERY    . FLEXIBLE BRONCHOSCOPY N/A 10/22/2016   Procedure: FLEXIBLE BRONCHOSCOPY;  Surgeon:  Allyne Gee, MD;  Location: [facility] ORS;  Service: Pulmonary;  Laterality: N/A;  . HERNIA REPAIR    . IR FLUORO GUIDE PORT INSERTION LEFT  11/07/2016  . REPAIR KNEE LIGAMENT    . TONSILLECTOMY      Prior to Admission medications   Medication Sig Start Date End Date Taking? Authorizing Provider  amiodarone (PACERONE) 200 MG tablet Take 1 tablet by mouth daily. 11/10/17  Yes [provider]  apixaban (ELIQUIS) 5 MG TABS tablet Take 1 tablet (5 mg total) by mouth 2 (two) times daily. 04/10/17  Yes Verlon Au, NP  ipratropium-albuterol (DUONEB) 0.5-2.5 (3) MG/3ML SOLN Take 3 mLs by nebulization every 6 (six) hours as needed. 10/09/17  Yes Boscia, Greer Ee, NP  metoprolol succinate (TOPROL-XL) 50 MG 24 hr tablet Take 50 mg by mouth daily. Take with or immediately following a meal.   Yes [provider]  mometasone-formoterol (DULERA) 100-5 MCG/ACT AERO Inhale 2 puffs into the lungs 2 times daily at 12 noon and 4 pm. 09/17/17  Yes Allyne Gee, MD  primidone (MYSOLINE) 50 MG tablet Take 1 tablet by mouth daily. 08/18/17  Yes [provider]  simvastatin (ZOCOR) 10 MG tablet Take 10 mg by mouth every evening.    Yes [provider]  TRADJENTA 5 MG TABS tablet Take 5 mg by mouth daily. 08/25/17  Yes [provider]  Vitamin D, Ergocalciferol, (DRISDOL) 50000 units CAPS capsule Take 1 capsule by mouth every Thursday. 08/25/17  Yes [provider]  acetaminophen (TYLENOL) 325 MG tablet Take 2 tablets (650 mg total) by mouth every 6 (six) hours as needed for mild pain (or Fever >/= 101). 09/26/16   [name], MD  amoxicillin-clavulanate (AUGMENTIN) 875-125 MG tablet Take 1 tablet by mouth 2 (two) times daily. Patient not taking: Reported on 10/26/2017 10/09/17   Ronnell Freshwater, NP  benzonatate (TESSALON) 100 MG capsule Take 1 capsule by mouth 3 (three) times daily as needed. 09/30/17   [provider]  methylPREDNISolone (MEDROL) 4 MG TBPK  tablet Take by mouth as directed for 6 days Patient not taking: Reported on 10/26/2017 10/09/17   Ronnell Freshwater, NP  NONFORMULARY OR COMPOUNDED ITEM Take 30 mLs by mouth every 6 (six) hours as needed for up to 2 doses. Mix Maalox + 2% Viscous Lidocaine + Donnatal in equal amounts. Take 30 ml PO q6hrs. Do not exceed 3 doses per day. 09/30/17   Milinda Pointer, MD  oxyCODONE (OXY IR/ROXICODONE) 5 MG immediate release tablet Take 2 tablets (10 mg total) by mouth every 8 (eight) hours as needed for severe pain. 10/12/17 11/11/17  Epifanio Lesches, MD  prochlorperazine (COMPAZINE) 10 MG tablet Take 1 tablet (10 mg total) by mouth every 6 (six) hours as needed (Nausea or vomiting). Patient not taking: Reported on 12/04/2016 11/04/16 02/03/17  Sindy Guadeloupe, MD    Allergies Patient has no known allergies.  Family History  Problem Relation Age of Onset  . Prostate cancer Father   . Prostate cancer Brother   . Diabetes Unknown        all 13 siblings  . Hypertension Unknown        all 13 siblings    Social History Social  History   Tobacco Use  . Smoking status: Former Smoker    Last attempt to quit: 12/01/2003    Years since quitting: 13.9  . Smokeless tobacco: Never Used  Substance Use Topics  . Alcohol use: Not Currently    Alcohol/week: 1.2 oz    Types: 2 Shots of liquor per week    Frequency: Never    Comment: on the weeknd 2 shots  . Drug use: No    Review of Systems Constitutional: Negative for fever. Eyes: Negative for visual changes. ENT: Negative for sore throat. Neck: No neck pain  Cardiovascular: Negative for chest pain. Respiratory: + shortness of breath, cough Gastrointestinal: Negative for abdominal pain, vomiting or diarrhea. Genitourinary: Negative for dysuria. Musculoskeletal: Negative for back pain. Skin: Negative for rash. Neurological: Negative for headaches, weakness or numbness. Psych: No SI or  HI  ____________________________________________   PHYSICAL EXAM:  VITAL SIGNS: ED Triage Vitals  Enc Vitals Group     BP 11/13/17 0946 97/67     Pulse Rate 11/13/17 0946 (!) 124     Resp 11/13/17 0946 18     Temp 11/13/17 0946 99.7 F (37.6 C)     Temp Source 11/13/17 0946 Oral     SpO2 11/13/17 0946 (!) 89 %     Weight 11/13/17 0951 195 lb (88.5 kg)     Height 11/13/17 0951 _0  (1.803 m)     Head Circumference --      Peak Flow --      Pain Score 11/13/17 0950 0     Pain Loc --      Pain Edu? --      Excl. in [place]? --     Constitutional: Alert and oriented.  Mild to moderate respiratory distress  HEENT:      Head: Normocephalic and atraumatic.         Eyes: Conjunctivae are normal. Sclera is non-icteric.       Mouth/Throat: Mucous membranes are moist.       Neck: Supple with no signs of meningismus. Cardiovascular: Tachycardic with regular rhythm. No murmurs, gallops, or rubs. 2+ symmetrical distal pulses are present in all extremities. No JVD. Respiratory: Increased work of breathing, hypoxic on 3 L to 89%, which improved on 5 L nasal cannula to 96%, decreased air movement with wheezing bilaterally  Gastrointestinal: Soft, non tender, and non distended with positive bowel sounds. No rebound or guarding. Musculoskeletal: Nontender with normal range of motion in all extremities. No edema, cyanosis, or erythema of extremities. Neurologic: Normal speech and language. Face is symmetric. Moving all extremities. No gross focal neurologic deficits are appreciated. Skin: Skin is warm, dry and intact. No rash noted. Psychiatric: Mood and affect are normal. Speech and behavior are normal.  ____________________________________________   LABS (all labs ordered are listed, but only abnormal results are displayed)  Labs Reviewed  CBC - Abnormal; Notable for the following components:      Result Value   RDW 15.7 (*)    All other components within normal limits  COMPREHENSIVE  METABOLIC PANEL - Abnormal; Notable for the following components:   Glucose, Bld 148 (*)    Creatinine, Ser 1.44 (*)    Total Protein 8.9 (*)    Albumin 3.1 (*)    GFR calc non Af Amer 46 (*)    GFR calc Af Amer 53 (*)    All other components within normal limits  TROPONIN I - Abnormal; Notable for the following components:  Troponin I 0.33 (*)    All other components within normal limits  CULTURE, BLOOD (ROUTINE X 2)  CULTURE, BLOOD (ROUTINE X 2)  LACTIC ACID, PLASMA   ____________________________________________  EKG  ED ECG REPORT I, Rudene Re, the attending physician, personally viewed and interpreted this ECG.  Sinus tachycardia, rate of 120, intraventricular conduction delay, normal QTC, normal axis, T wave inversions in lateral leads, no ST elevation.  T wave inversions are new when compared to prior. ____________________________________________  RADIOLOGY  I have personally reviewed the images performed during this visit and I agree with the Radiologist's read.   Interpretation by Radiologist:  Dg Chest 2 View  Result Date: 11/13/2017 CLINICAL DATA:  Shortness of breath. Known squamous cell lung carcinoma EXAM: CHEST - 2 VIEW COMPARISON:  Chest radiograph October 10, 2017 and chest CT September 07, 2017 FINDINGS: The pulmonary nodular lesion on the right has increased in size, currently measuring 2.6 x 2.3 cm. There is a right pleural effusion with consolidation in portions of the right middle and lower lobes. There is scarring in the right mid lung region. On the left, there is extensive bullous disease with areas of scarring. No consolidation noted on the left. Heart size normal. There is distortion of pulmonary vascularity bilaterally due to the underlying COPD. No adenopathy is likely aided on radiography. There is degenerative change in the thoracic spine. Port-A-Cath tip is in the superior vena cava. There is aortic atherosclerosis. IMPRESSION: 1. Pulmonary nodular  lesion right upper lobe has increased in size compared to status. 2. There is no pleural effusion on the right with consolidation in portions of the right middle and lower lobes. 3. 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 Areas of scarring in the right mid lung and left base regions. 4.  Stable cardiac silhouette.  There is aortic atherosclerosis. 5.  Port-A-Cath tip in superior vena cava. Aortic Atherosclerosis (ICD10-I70.0) and Emphysema (ICD10-J43.9). Electronically Signed   By: Lowella Grip III M.D.   On: 11/13/2017 10:17   Ct Angio Chest Pe W And/or Wo Contrast  Result Date: 11/13/2017 CLINICAL DATA:  Shortness of breath.  History of lung carcinoma EXAM: CT ANGIOGRAPHY CHEST WITH CONTRAST TECHNIQUE: Multidetector CT imaging of the chest was performed using the standard protocol during bolus administration of intravenous contrast. Multiplanar CT image reconstructions and MIPs were obtained to evaluate the vascular anatomy. CONTRAST:  39m ISOVUE-370 IOPAMIDOL (ISOVUE-370) INJECTION 76% COMPARISON:  Chest CT September 07, 2017; chest radiograph Nov 13, 2017 FINDINGS: Cardiovascular: There is no appreciable pulmonary embolus. There is no appreciable thoracic aortic dissection. Ascending thoracic aorta measures 4.0 x 4.0 cm, stable. Visualized great vessels appear normal except for minimal calcification in the proximal left subclavian artery. There are foci of aortic atherosclerosis. There are foci of coronary artery calcification. There is no appreciable pericardial effusion or pericardial thickening. Port-A-Cath tip is in the superior vena cava near the cavoatrial junction. Main pulmonary outflow tract measures 3.1 cm. Mediastinum/Nodes: Thyroid appears unremarkable. There is a prominent left axillary lymph node measuring 1.5 x 1.5 cm. No other lymph node prominence is noted in the axillary regions. There is no lymph node enlargement appreciable elsewhere in the thoracic region. No esophageal lesions are  evident. Lungs/Pleura: There is a large bulla on the left occupying much of the left lung. There are multiple smaller bullae throughout the left lung. There also bullae in the right upper lobe. There is extensive cicatrization bilaterally, particularly in the remaining left upper lobe. There is a  sizable right pleural effusion with consolidation in much of the right lower lobe. There is also consolidation in a portion of the anterior segment right upper lobe more medially. There is a mass in the posterior segment of the right upper lobe measuring 2.4 x 2.1 x 2.4 cm. Upper Abdomen: There is a mass lesion in the posterior segment of the right lobe of the liver measuring 3.1 x 2.8 cm. There is incomplete visualization of a second mass in the liver, anterior segment right lobe region, measuring 2.2 x 2.1 cm. There may be additional lesions incompletely visualized in the right lobe slightly more inferiorly. There is incomplete visualization of a cyst arising from the upper pole the right kidney measuring 6.4 x 6.3 cm. No adrenal lesions are appreciable. There is aortic atherosclerosis. Musculoskeletal: There is extensive degenerative change in the thoracic spine with diffuse idiopathic skeletal hyperostosis. No blastic or lytic bone lesions are evident. Review of the MIP images confirms the above findings. IMPRESSION: 1.  No pulmonary embolus. 2. Mass in the posterior segment of the right upper lobe has increased in size. 3. Sizable right pleural effusion with consolidation in much of the right upper lobe. There is also consolidation in the medial aspect of the anterior segment right upper lobe. 4. 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 Areas of cicatrization noted bilaterally. 5. Enlarged left axillary lymph node, suspicious for neoplastic etiology. 6. Liver lesions, incompletely visualized, felt to represent metastatic foci. 7. Ascending thoracic aorta measures 4.0  x 4.0 cm, stable. No dissection evident. Recommend annual imaging followup by CTA or MRA. This recommendation follows 2010 ACCF/AHA/AATS/ACR/ASA/SCA/SCAI/SIR/STS/SVM Guidelines for the Diagnosis and Management of Patients with Thoracic Aortic Disease. Circulation. 2010; 121: D357-S177. 8. Prominence of the main pulmonary outflow tract, indicative of a degree of pulmonary arterial hypertension. Aortic Atherosclerosis (ICD10-I70.0) and Emphysema (ICD10-J43.9). Electronically Signed   By: Lowella Grip III M.D.   On: 11/13/2017 12:17    ____________________________________________   PROCEDURES  Procedure(s) performed: None Procedures Critical Care performed: yes  CRITICAL CARE Performed by: Rudene Re  ?  Total critical care time: 40 min  Critical care time was exclusive of separately billable procedures and treating other patients.  Critical care was necessary to treat or prevent imminent or life-threatening deterioration.  Critical care was time spent personally by me on the following activities: development of treatment plan with patient and/or surrogate as well as nursing, discussions with consultants, evaluation of patient's response to treatment, examination of patient, obtaining history from patient or surrogate, ordering and performing treatments and interventions, ordering and review of laboratory studies, ordering and review of radiographic studies, pulse oximetry and re-evaluation of patient's condition.  ____________________________________________   INITIAL IMPRESSION / ASSESSMENT AND PLAN / ED COURSE  77 y.o. male with a history of lung cancer not currently on chemoradiation, CHF with a EF of 55 to 60%, CAD, COPD on 3 L , diabetes, hypertension, PE on eliquis who presents for evaluation of shortness of breath.  Patient with acute on chronic hypoxic respiratory failure.  Chest x-ray concerning for infiltrates.  Patient's troponin is elevated at 0.33.  EKG showing  new T wave inversions in lateral leads.  Elevated troponin and EKG findings could be due to demand ischemia however since patient has active cancer and has had a PE in the past I will send him for a CT angiogram to rule out a blood clot.  Patient has significant wheezing and  increased oxygen requirement.  Will start patient on DuoNeb's, Solu-Medrol, and broad-spectrum antibiotics for H CAP including cefepime and vancomycin.  We will give IV fluids for tachycardia.  Will admit to the hospitalist service.    _________________________ 12:27 PM on 11/13/2017 -----------------------------------------  CT showing worsening right-sided lung mass with no evidence of pulmonary embolism.  Patient's respiratory status improved slightly after DuoNeb's however still requiring high oxygen content.  Patient was given antibiotic for possible superimpose PNA.  Patient will be admitted for acute on chronic respiratory failure, elevated troponin concerning for NSTEMI.   As part of my medical decision making, I reviewed the following data within the [place] History obtained from family, Nursing notes reviewed and incorporated, Labs reviewed , EKG interpreted , Old EKG reviewed, Old chart reviewed, Radiograph reviewed , Discussed with admitting physician , Notes from prior ED visits and [place] Controlled Substance Database    Pertinent labs & imaging results that were available during my care of the patient were reviewed by me and considered in my medical decision making (see chart for details).    ____________________________________________   FINAL CLINICAL IMPRESSION(S) / ED DIAGNOSES  Final diagnoses:  HCAP (healthcare-associated pneumonia)  NSTEMI (non-ST elevated myocardial infarction) ([place])  Acute on chronic respiratory failure with hypoxia ([place])      NEW MEDICATIONS STARTED DURING THIS VISIT:  ED Discharge Orders    None       Note:  This document was prepared using Dragon  voice recognition software and may include unintentional dictation errors.    Alfred Levins, [place], MD 11/13/17 1230

## 2017-11-13 NOTE — H&P (Signed)
[facility] [place] NAME: Chris Wilkinson    MR#:  [id number]  DATE OF BIRTH:  08/29/1940  DATE OF ADMISSION:  11/13/2017  PRIMARY CARE PHYSICIAN: Danelle Berry, NP   REQUESTING/REFERRING PHYSICIAN: Dr. Alfred Levins  CHIEF COMPLAINT:   Chief Complaint  Patient presents with  . Shortness of Breath    HISTORY OF PRESENT ILLNESS:  Chris Wilkinson  is a 77 y.o. male with a known history of COPD, chronic respiratory failure, chronic diastolic CHF, PE on anticoagulation, recurrence of his lung cancer on the right side waiting for radiation presents to the emergency room complaining of shortness of breath.  He had a temperature of 100.2 on arrival.  Subjective fevers at home.  Elevated white count.  Chest x-ray/CTA chest showing right-sided pneumonia.  Patient had to be placed on 5 L oxygen.  Baseline is 2 to 3 L at home.  PAST MEDICAL HISTORY:   Past Medical History:  Diagnosis Date  . Cancer ([place])    Lung CA  . CHF (congestive heart failure) ([place])   . COPD (chronic obstructive pulmonary disease) ([place])   . Coronary artery disease   . Diabetes mellitus without complication ([place])   . Fibromyalgia   . GERD (gastroesophageal reflux disease)   . Hypertension   . Pulmonary emboli ([place])     PAST SURGICAL HISTORY:   Past Surgical History:  Procedure Laterality Date  . COLONOSCOPY WITH PROPOFOL N/A 01/18/2015   Procedure: COLONOSCOPY WITH PROPOFOL;  Surgeon: Manya Silvas, MD;  Location: [facility] ENDOSCOPY;  Service: Endoscopy;  Laterality: N/A;  . ESOPHAGOGASTRODUODENOSCOPY N/A 01/18/2015   Procedure: ESOPHAGOGASTRODUODENOSCOPY (EGD);  Surgeon: Manya Silvas, MD;  Location: [facility] ENDOSCOPY;  Service: Endoscopy;  Laterality: N/A;  . EYE SURGERY    . FLEXIBLE BRONCHOSCOPY N/A 10/22/2016   Procedure: FLEXIBLE BRONCHOSCOPY;  Surgeon: Allyne Gee, MD;  Location: [facility] ORS;  Service: Pulmonary;  Laterality: N/A;  . HERNIA REPAIR    . IR FLUORO GUIDE  PORT INSERTION LEFT  11/07/2016  . REPAIR KNEE LIGAMENT    . TONSILLECTOMY      SOCIAL HISTORY:   Social History   Tobacco Use  . Smoking status: Former Smoker    Last attempt to quit: 12/01/2003    Years since quitting: 13.9  . Smokeless tobacco: Never Used  Substance Use Topics  . Alcohol use: Not Currently    Alcohol/week: 1.2 oz    Types: 2 Shots of liquor per week    Frequency: Never    Comment: on the weeknd 2 shots    FAMILY HISTORY:   Family History  Problem Relation Age of Onset  . Prostate cancer Father   . Prostate cancer Brother   . Diabetes Unknown        all 13 siblings  . Hypertension Unknown        all 13 siblings    DRUG ALLERGIES:  No Known Allergies  REVIEW OF SYSTEMS:   Review of Systems  Constitutional: Positive for malaise/fatigue. Negative for chills and fever.  HENT: Negative for sore throat.   Eyes: Negative for blurred vision, double vision and pain.  Respiratory: Positive for cough, shortness of breath and wheezing. Negative for hemoptysis.   Cardiovascular: Negative for chest pain, palpitations, orthopnea and leg swelling.  Gastrointestinal: Negative for abdominal pain, constipation, diarrhea, heartburn, nausea and vomiting.  Genitourinary: Negative for dysuria and hematuria.  Musculoskeletal: Negative for back pain and joint pain.  Skin: Negative  for rash.  Neurological: Negative for sensory change, speech change, focal weakness and headaches.  Endo/Heme/Allergies: Does not bruise/bleed easily.  Psychiatric/Behavioral: Negative for depression. The patient is not nervous/anxious.     MEDICATIONS AT HOME:   Prior to Admission medications   Medication Sig Start Date End Date Taking? Authorizing Provider  amiodarone (PACERONE) 200 MG tablet Take 1 tablet by mouth daily. 11/10/17  Yes [provider]  apixaban (ELIQUIS) 5 MG TABS tablet Take 1 tablet (5 mg total) by mouth 2 (two) times daily. 04/10/17  Yes Verlon Au, NP   ipratropium-albuterol (DUONEB) 0.5-2.5 (3) MG/3ML SOLN Take 3 mLs by nebulization every 6 (six) hours as needed. 10/09/17  Yes Boscia, Greer Ee, NP  metoprolol succinate (TOPROL-XL) 50 MG 24 hr tablet Take 50 mg by mouth daily. Take with or immediately following a meal.   Yes [provider]  mometasone-formoterol (DULERA) 100-5 MCG/ACT AERO Inhale 2 puffs into the lungs 2 times daily at 12 noon and 4 pm. 09/17/17  Yes Allyne Gee, MD  primidone (MYSOLINE) 50 MG tablet Take 1 tablet by mouth daily. 08/18/17  Yes [provider]  simvastatin (ZOCOR) 10 MG tablet Take 10 mg by mouth every evening.    Yes [provider]  TRADJENTA 5 MG TABS tablet Take 5 mg by mouth daily. 08/25/17  Yes [provider]  Vitamin D, Ergocalciferol, (DRISDOL) 50000 units CAPS capsule Take 1 capsule by mouth every Thursday. 08/25/17  Yes [provider]  acetaminophen (TYLENOL) 325 MG tablet Take 2 tablets (650 mg total) by mouth every 6 (six) hours as needed for mild pain (or Fever >/= 101). 09/26/16   [name], MD  amoxicillin-clavulanate (AUGMENTIN) 875-125 MG tablet Take 1 tablet by mouth 2 (two) times daily. Patient not taking: Reported on 10/26/2017 10/09/17   Ronnell Freshwater, NP  benzonatate (TESSALON) 100 MG capsule Take 1 capsule by mouth 3 (three) times daily as needed. 09/30/17   [provider]  methylPREDNISolone (MEDROL) 4 MG TBPK tablet Take by mouth as directed for 6 days Patient not taking: Reported on 10/26/2017 10/09/17   Ronnell Freshwater, NP  NONFORMULARY OR COMPOUNDED ITEM Take 30 mLs by mouth every 6 (six) hours as needed for up to 2 doses. Mix Maalox + 2% Viscous Lidocaine + Donnatal in equal amounts. Take 30 ml PO q6hrs. Do not exceed 3 doses per day. 09/30/17   Milinda Pointer, MD  oxyCODONE (OXY IR/ROXICODONE) 5 MG immediate release tablet Take 2 tablets (10 mg total) by mouth every 8 (eight) hours as needed for severe pain. 10/12/17 11/11/17   Epifanio Lesches, MD  prochlorperazine (COMPAZINE) 10 MG tablet Take 1 tablet (10 mg total) by mouth every 6 (six) hours as needed (Nausea or vomiting). Patient not taking: Reported on 12/04/2016 11/04/16 02/03/17  Sindy Guadeloupe, MD     VITAL SIGNS:  Blood pressure (!) 135/94, pulse (!) 255, temperature 99.7 F (37.6 C), temperature source Oral, resp. rate (!) 35, height _0  (1.803 m), weight 88.5 kg (195 lb), SpO2 96 %.  PHYSICAL EXAMINATION:  Physical Exam  GENERAL:  77 y.o.-year-old patient lying in the bed with no acute distress.  EYES: Pupils equal, round, reactive to light and accommodation. No scleral icterus. Extraocular muscles intact.  HEENT: Head atraumatic, normocephalic. Oropharynx and nasopharynx clear. No oropharyngeal erythema, moist oral mucosa  NECK:  Supple, no jugular venous distention. No thyroid enlargement, no tenderness.  LUNGS: Normal breath sounds bilaterally, no wheezing,  rales, rhonchi. No use of accessory muscles of respiration.  CARDIOVASCULAR: S1, S2 normal. No murmurs, rubs, or gallops.  ABDOMEN: Soft, nontender, nondistended. Bowel sounds present. No organomegaly or mass.  EXTREMITIES: No pedal edema, cyanosis, or clubbing. + 2 pedal & radial pulses b/l.   NEUROLOGIC: Cranial nerves II through XII are intact. No focal Motor or sensory deficits appreciated b/l PSYCHIATRIC: The patient is alert and oriented x 3. Good affect.  SKIN: No obvious rash, lesion, or ulcer.   LABORATORY PANEL:   CBC Recent Labs  Lab 11/13/17 0959  WBC 7.1  HGB 13.2  HCT 40.2  PLT 200   ------------------------------------------------------------------------------------------------------------------  Chemistries  Recent Labs  Lab 11/13/17 0959  NA 135  K 4.9  CL 101  CO2 27  GLUCOSE 148*  BUN 15  CREATININE 1.44*  CALCIUM 9.5  AST 26  ALT 18  ALKPHOS 95  BILITOT 0.9    ------------------------------------------------------------------------------------------------------------------  Cardiac Enzymes Recent Labs  Lab 11/13/17 0959  TROPONINI 0.33*   ------------------------------------------------------------------------------------------------------------------  RADIOLOGY:  Dg Chest 2 View  Result Date: 11/13/2017 CLINICAL DATA:  Shortness of breath. Known squamous cell lung carcinoma EXAM: CHEST - 2 VIEW COMPARISON:  Chest radiograph October 10, 2017 and chest CT September 07, 2017 FINDINGS: The pulmonary nodular lesion on the right has increased in size, currently measuring 2.6 x 2.3 cm. There is a right pleural effusion with consolidation in portions of the right middle and lower lobes. There is scarring in the right mid lung region. On the left, there is extensive bullous disease with areas of scarring. No consolidation noted on the left. Heart size normal. There is distortion of pulmonary vascularity bilaterally due to the underlying COPD. No adenopathy is likely aided on radiography. There is degenerative change in the thoracic spine. Port-A-Cath tip is in the superior vena cava. There is aortic atherosclerosis. IMPRESSION: 1. Pulmonary nodular lesion right upper lobe has increased in size compared to status. 2. There is no pleural effusion on the right with consolidation in portions of the right middle and lower lobes. 3. 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 Areas of scarring in the right mid lung and left base regions. 4.  Stable cardiac silhouette.  There is aortic atherosclerosis. 5.  Port-A-Cath tip in superior vena cava. Aortic Atherosclerosis (ICD10-I70.0) and Emphysema (ICD10-J43.9). Electronically Signed   By: Lowella Grip III M.D.   On: 11/13/2017 10:17   Ct Angio Chest Pe W And/or Wo Contrast  Result Date: 11/13/2017 CLINICAL DATA:  Shortness of breath.  History of lung carcinoma EXAM: CT ANGIOGRAPHY CHEST WITH CONTRAST TECHNIQUE:  Multidetector CT imaging of the chest was performed using the standard protocol during bolus administration of intravenous contrast. Multiplanar CT image reconstructions and MIPs were obtained to evaluate the vascular anatomy. CONTRAST:  39m ISOVUE-370 IOPAMIDOL (ISOVUE-370) INJECTION 76% COMPARISON:  Chest CT September 07, 2017; chest radiograph Nov 13, 2017 FINDINGS: Cardiovascular: There is no appreciable pulmonary embolus. There is no appreciable thoracic aortic dissection. Ascending thoracic aorta measures 4.0 x 4.0 cm, stable. Visualized great vessels appear normal except for minimal calcification in the proximal left subclavian artery. There are foci of aortic atherosclerosis. There are foci of coronary artery calcification. There is no appreciable pericardial effusion or pericardial thickening. Port-A-Cath tip is in the superior vena cava near the cavoatrial junction. Main pulmonary outflow tract measures 3.1 cm. Mediastinum/Nodes: Thyroid appears unremarkable. There is a prominent left axillary lymph node measuring 1.5 x 1.5 cm. No other lymph node prominence is noted  in the axillary regions. There is no lymph node enlargement appreciable elsewhere in the thoracic region. No esophageal lesions are evident. Lungs/Pleura: There is a large bulla on the left occupying much of the left lung. There are multiple smaller bullae throughout the left lung. There also bullae in the right upper lobe. There is extensive cicatrization bilaterally, particularly in the remaining left upper lobe. There is a sizable right pleural effusion with consolidation in much of the right lower lobe. There is also consolidation in a portion of the anterior segment right upper lobe more medially. There is a mass in the posterior segment of the right upper lobe measuring 2.4 x 2.1 x 2.4 cm. Upper Abdomen: There is a mass lesion in the posterior segment of the right lobe of the liver measuring 3.1 x 2.8 cm. There is incomplete visualization  of a second mass in the liver, anterior segment right lobe region, measuring 2.2 x 2.1 cm. There may be additional lesions incompletely visualized in the right lobe slightly more inferiorly. There is incomplete visualization of a cyst arising from the upper pole the right kidney measuring 6.4 x 6.3 cm. No adrenal lesions are appreciable. There is aortic atherosclerosis. Musculoskeletal: There is extensive degenerative change in the thoracic spine with diffuse idiopathic skeletal hyperostosis. No blastic or lytic bone lesions are evident. Review of the MIP images confirms the above findings. IMPRESSION: 1.  No pulmonary embolus. 2. Mass in the posterior segment of the right upper lobe has increased in size. 3. Sizable right pleural effusion with consolidation in much of the right upper lobe. There is also consolidation in the medial aspect of the anterior segment right upper lobe. 4. 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 Areas of cicatrization noted bilaterally. 5. Enlarged left axillary lymph node, suspicious for neoplastic etiology. 6. Liver lesions, incompletely visualized, felt to represent metastatic foci. 7. Ascending thoracic aorta measures 4.0 x 4.0 cm, stable. No dissection evident. Recommend annual imaging followup by CTA or MRA. This recommendation follows 2010 ACCF/AHA/AATS/ACR/ASA/SCA/SCAI/SIR/STS/SVM Guidelines for the Diagnosis and Management of Patients with Thoracic Aortic Disease. Circulation. 2010; 121: H680-S811. 8. Prominence of the main pulmonary outflow tract, indicative of a degree of pulmonary arterial hypertension. Aortic Atherosclerosis (ICD10-I70.0) and Emphysema (ICD10-J43.9). Electronically Signed   By: Lowella Grip III M.D.   On: 11/13/2017 12:17     IMPRESSION AND PLAN:   * Right healthcare acquired pneumonia with COPD exacerbation and acute on chronic hypoxic respiratory failure Start broad-spectrum antibiotics.  IV  cefepime and vancomycin.  Blood and sputum cultures. Wean oxygen as tolerated.  IV steroids, nebulizers.  Inhalers from home. Patient is immunocompromised.  *Right lung cancer.  Patient had left-sided lung cancer and had resection and was in remission.  Right-sided lung cancer is a new diagnosis.  He is due for radiation treatment on Monday.  Follows with Dr. Janese Banks in the cancer center.  *Diabetes mellitus.  Sliding scale insulin.  Home medication.  *Chronic diastolic congestive heart failure.  Continue home medications.  History of pulmonary embolism.  On anticoagulation.   All the records are reviewed and case discussed with ED provider. Management plans discussed with the patient, family and they are in agreement.  CODE STATUS: DNR  TOTAL TIME TAKING CARE OF THIS PATIENT: 40 minutes.   Leia Alf Kateland Leisinger M.D on 11/13/2017 at 2:14 PM  Between 7am to 6pm - Pager - [phone]  After 6pm go to [email or web address] - password [email or web address]  [facility]  Office  [phone]  CC: Primary care physician; Danelle Berry, NP  Note: This dictation was prepared with Dragon dictation along with smaller phrase technology. Any transcriptional errors that result from this process are unintentional.

## 2017-11-13 NOTE — Progress Notes (Addendum)
Pt admitted to room 150 from the ED. Pt is A&Ox4; on 2L [place], no respiratory distress. Pt placed on continuous pulse ox. HR improved to low 100s after metoprolol given.   [place], Jerry Caras

## 2017-11-13 NOTE — Progress Notes (Signed)
Advance care planning  Purpose of Encounter Pneumonia, CODE STATUS discussion, lung cancer  Parties in Attendance Patient and his healthcare power of attorney wife at bedside  Patients Decisional capacity Patient is alert and oriented.  Able to make own decisions.  Met with patient and wife at bedside.  Discussed with patient regarding hospitalization for acute pneumonia, COPD exacerbation and acute on chronic hypoxic respiratory failure.  We discussed regarding his new diagnosed lung cancer and CODE STATUS.  Patient is hopeful his lung cancer would respond well with radiation.  He is not aware of staging at this time.  He has been in remission for a prolonged period with his left lung cancer.  At this point he is hopeful that he will continue his radiation treatment next Monday. Discussed CODE STATUS and patient is DO NOT RESUSCITATE and DO NOT INTUBATE.  Goals of care determination Recurrence of lung cancer on the right lung.  Also has COPD with chronic respiratory failure.  For the prognosis depends on response to radiation treatment.  DO NOT RESUSCITATE and DO NOT INTUBATE.  Time spent -  18 minutes

## 2017-11-14 DIAGNOSIS — J449 Chronic obstructive pulmonary disease, unspecified: Secondary | ICD-10-CM

## 2017-11-14 DIAGNOSIS — C3491 Malignant neoplasm of unspecified part of right bronchus or lung: Secondary | ICD-10-CM

## 2017-11-14 DIAGNOSIS — R599 Enlarged lymph nodes, unspecified: Secondary | ICD-10-CM

## 2017-11-14 DIAGNOSIS — Z9981 Dependence on supplemental oxygen: Secondary | ICD-10-CM

## 2017-11-14 DIAGNOSIS — I10 Essential (primary) hypertension: Secondary | ICD-10-CM

## 2017-11-14 DIAGNOSIS — R0602 Shortness of breath: Secondary | ICD-10-CM

## 2017-11-14 DIAGNOSIS — J189 Pneumonia, unspecified organism: Principal | ICD-10-CM

## 2017-11-14 DIAGNOSIS — Z86711 Personal history of pulmonary embolism: Secondary | ICD-10-CM

## 2017-11-14 DIAGNOSIS — E119 Type 2 diabetes mellitus without complications: Secondary | ICD-10-CM

## 2017-11-14 DIAGNOSIS — J90 Pleural effusion, not elsewhere classified: Secondary | ICD-10-CM

## 2017-11-14 DIAGNOSIS — R05 Cough: Secondary | ICD-10-CM

## 2017-11-14 LAB — BLOOD CULTURE ID PANEL (REFLEXED)
Acinetobacter baumannii: NOT DETECTED
CANDIDA ALBICANS: NOT DETECTED
CANDIDA GLABRATA: NOT DETECTED
CANDIDA KRUSEI: NOT DETECTED
Candida parapsilosis: NOT DETECTED
Candida tropicalis: NOT DETECTED
ENTEROBACTER CLOACAE COMPLEX: NOT DETECTED
ENTEROCOCCUS SPECIES: NOT DETECTED
Enterobacteriaceae species: NOT DETECTED
Escherichia coli: NOT DETECTED
Haemophilus influenzae: NOT DETECTED
Klebsiella oxytoca: NOT DETECTED
Klebsiella pneumoniae: NOT DETECTED
Listeria monocytogenes: NOT DETECTED
Methicillin resistance: NOT DETECTED
Neisseria meningitidis: NOT DETECTED
PSEUDOMONAS AERUGINOSA: NOT DETECTED
Proteus species: NOT DETECTED
STREPTOCOCCUS PNEUMONIAE: NOT DETECTED
STREPTOCOCCUS PYOGENES: NOT DETECTED
Serratia marcescens: NOT DETECTED
Staphylococcus aureus (BCID): NOT DETECTED
Staphylococcus species: DETECTED — AB
Streptococcus agalactiae: NOT DETECTED
Streptococcus species: NOT DETECTED

## 2017-11-14 LAB — GLUCOSE, CAPILLARY
GLUCOSE-CAPILLARY: 124 mg/dL — AB (ref 65–99)
GLUCOSE-CAPILLARY: 155 mg/dL — AB (ref 65–99)
Glucose-Capillary: 145 mg/dL — ABNORMAL HIGH (ref 65–99)
Glucose-Capillary: 149 mg/dL — ABNORMAL HIGH (ref 65–99)

## 2017-11-14 LAB — BASIC METABOLIC PANEL
Anion gap: 9 (ref 5–15)
BUN: 23 mg/dL — AB (ref 6–20)
CHLORIDE: 102 mmol/L (ref 101–111)
CO2: 25 mmol/L (ref 22–32)
Calcium: 10 mg/dL (ref 8.9–10.3)
Creatinine, Ser: 1.47 mg/dL — ABNORMAL HIGH (ref 0.61–1.24)
GFR calc Af Amer: 52 mL/min — ABNORMAL LOW (ref >60)
GFR calc non Af Amer: 45 mL/min — ABNORMAL LOW (ref >60)
GLUCOSE: 157 mg/dL — AB (ref 65–99)
POTASSIUM: 4.7 mmol/L (ref 3.5–5.1)
Sodium: 136 mmol/L (ref 135–145)

## 2017-11-14 LAB — CBC
HCT: 40.7 % (ref 40.0–52.0)
Hemoglobin: 13.5 g/dL (ref 13.0–18.0)
MCH: 29.1 pg (ref 26.0–34.0)
MCHC: 33.2 g/dL (ref 32.0–36.0)
MCV: 87.7 fL (ref 80.0–100.0)
PLATELETS: 217 10*3/uL (ref 150–440)
RBC: 4.64 MIL/uL (ref 4.40–5.90)
RDW: 15.2 % — AB (ref 11.5–14.5)
WBC: 7.7 10*3/uL (ref 3.8–10.6)

## 2017-11-14 MED ORDER — METHYLPREDNISOLONE SODIUM SUCC 40 MG IJ SOLR
40.0000 mg | Freq: Two times a day (BID) | INTRAMUSCULAR | Status: DC
  Administered 2017-11-14 – 2017-11-16 (×4): 40 mg via INTRAVENOUS
  Filled 2017-11-14 (×4): qty 1

## 2017-11-14 NOTE — Progress Notes (Signed)
Pt states he feels more sob after Duoneb svn.

## 2017-11-14 NOTE — Plan of Care (Signed)
  Problem: Education: Goal: Knowledge of General Education information will improve Outcome: Progressing   Problem: Health Behavior/Discharge Planning: Goal: Ability to manage health-related needs will improve Outcome: Progressing   Problem: Clinical Measurements: Goal: Ability to maintain clinical measurements within normal limits will improve Outcome: Progressing Goal: Will remain free from infection Outcome: Progressing Goal: Diagnostic test results will improve Outcome: Progressing Goal: Respiratory complications will improve Outcome: Progressing Goal: Cardiovascular complication will be avoided Outcome: Progressing   Problem: Activity: Goal: Risk for activity intolerance will decrease Outcome: Progressing   Problem: Nutrition: Goal: Adequate nutrition will be maintained Outcome: Progressing   Problem: Coping: Goal: Level of anxiety will decrease Outcome: Progressing   Problem: Elimination: Goal: Will not experience complications related to bowel motility Outcome: Progressing Goal: Will not experience complications related to urinary retention Outcome: Progressing   Problem: Pain Managment: Goal: General experience of comfort will improve Outcome: Progressing   Problem: Safety: Goal: Ability to remain free from injury will improve Outcome: Progressing   Problem: Skin Integrity: Goal: Risk for impaired skin integrity will decrease Outcome: Progressing   Problem: Education: Goal: Knowledge of disease or condition will improve Outcome: Progressing Goal: Knowledge of the prescribed therapeutic regimen will improve Outcome: Progressing   Problem: Activity: Goal: Ability to tolerate increased activity will improve Outcome: Progressing Goal: Will verbalize the importance of balancing activity with adequate rest periods Outcome: Progressing   Problem: Respiratory: Goal: Ability to maintain a clear airway will improve Outcome: Progressing Goal: Levels of  oxygenation will improve Outcome: Progressing Goal: Ability to maintain adequate ventilation will improve Outcome: Progressing

## 2017-11-14 NOTE — Consult Note (Signed)
[place] CONSULT NOTE  Patient Care Team: Danelle Berry, NP as PCP - General (Nurse Practitioner)  CHIEF COMPLAINTS/PURPOSE OF CONSULTATION:  Lung cancer  HISTORY OF PRESENTING ILLNESS:  Chris Wilkinson 77 y.o.  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  Patient has been diagnosed with pneumonia/currently on antibiotic steroids.   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 Janese Banks ;status post chemoradiation finished summer 2018].  Patient's consolidation immunotherapy was discontinued-given concerns radiation pneumonitis.  However more recently a CT scan in February 2019-noted to have a solitary right lung nodule growing suspicious for metastases.  Patient is supposed to start radiation/SBRT on May 6.  On admission to the hospital at this visit CT scan was done-that showed the right lung 2.5 cm nodule; unfortunately shows right-sided moderate effusion/consolidation; also at least 2 liver lesions incompletely visualized/also left 1.5 cm axillary lymph node.  Oncology has been consulted for further evaluation recommendations  Patient continues to have cough/shortness of breath with exertion; slight improvement since admission.  No nausea no vomiting.  Denies any headaches.  Appetite is fair.  ROS: A complete 10 point review of system is done which is negative except mentioned above in history of present illness  MEDICAL HISTORY:  Past Medical History:  Diagnosis Date  . Cancer ([place])    Lung CA  . CHF (congestive heart failure) ([place])   . COPD (chronic obstructive pulmonary disease) ([place])   . Coronary artery disease   . Diabetes mellitus without complication ([place])   . Fibromyalgia   . GERD (gastroesophageal reflux disease)   . Hypertension    . Pulmonary emboli ([place])     SURGICAL HISTORY: Past Surgical History:  Procedure Laterality Date  . COLONOSCOPY WITH PROPOFOL N/A 01/18/2015   Procedure: COLONOSCOPY WITH PROPOFOL;  Surgeon: Manya Silvas, MD;  Location: [facility] ENDOSCOPY;  Service: Endoscopy;  Laterality: N/A;  . ESOPHAGOGASTRODUODENOSCOPY N/A 01/18/2015   Procedure: ESOPHAGOGASTRODUODENOSCOPY (EGD);  Surgeon: Manya Silvas, MD;  Location: [facility] ENDOSCOPY;  Service: Endoscopy;  Laterality: N/A;  . EYE SURGERY    . FLEXIBLE BRONCHOSCOPY N/A 10/22/2016   Procedure: FLEXIBLE BRONCHOSCOPY;  Surgeon: Allyne Gee, MD;  Location: [facility] ORS;  Service: Pulmonary;  Laterality: N/A;  . HERNIA REPAIR    . IR FLUORO GUIDE PORT INSERTION LEFT  11/07/2016  . REPAIR KNEE LIGAMENT    . TONSILLECTOMY      SOCIAL HISTORY: Social History   Socioeconomic History  . Marital status: Married    Spouse name: Not on file  . Number of children: Not on file  . Years of education: Not on file  . Highest education level: Not on file  Occupational History  . Occupation: retired  Scientific laboratory technician  . Financial resource strain: Not on file  . Food insecurity:    Worry: Not on file    Inability: Not on file  . Transportation needs:    Medical: Not on file    Non-medical: Not on file  Tobacco Use  . Smoking status: Former Smoker    Last attempt to quit: 12/01/2003    Years since quitting: 13.9  . Smokeless tobacco: Never Used  Substance and Sexual Activity  . Alcohol use: Not Currently    Alcohol/week: 1.2 oz    Types: 2  Shots of liquor per week    Frequency: Never    Comment: on the weeknd 2 shots  . Drug use: No  . Sexual activity: Not on file  Lifestyle  . Physical activity:    Days per week: Not on file    Minutes per session: Not on file  . Stress: Not on file  Relationships  . Social connections:    Talks on phone: Not on file    Gets together: Not on file    Attends religious service: Not on file    Active member of club  or organization: Not on file    Attends meetings of clubs or organizations: Not on file    Relationship status: Not on file  . Intimate partner violence:    Fear of current or ex partner: Not on file    Emotionally abused: Not on file    Physically abused: Not on file    Forced sexual activity: Not on file  Other Topics Concern  . Not on file  Social History Narrative  . Not on file    FAMILY HISTORY: Family History  Problem Relation Age of Onset  . Prostate cancer Father   . Prostate cancer Brother   . Diabetes Unknown        all 13 siblings  . Hypertension Unknown        all 13 siblings    ALLERGIES:  has No Known Allergies.  MEDICATIONS:  Current Facility-Administered Medications  Medication Dose Route Frequency Provider Last Rate Last Dose  . acetaminophen (TYLENOL) tablet 650 mg  650 mg Oral Q6H PRN Hillary Bow, MD       Or  . acetaminophen (TYLENOL) suppository 650 mg  650 mg Rectal Q6H PRN Sudini, Srikar, MD      . albuterol (PROVENTIL) (2.5 MG/3ML) 0.083% nebulizer solution 2.5 mg  2.5 mg Nebulization Q2H PRN Sudini, Srikar, MD      . amiodarone (PACERONE) tablet 200 mg  200 mg Oral Daily Sudini, Srikar, MD   200 mg at 11/14/17 0800  . apixaban (ELIQUIS) tablet 5 mg  5 mg Oral BID Hillary Bow, MD   5 mg at 11/14/17 0800  . benzonatate (TESSALON) capsule 100 mg  100 mg Oral TID PRN Hillary Bow, MD      . ceFEPIme (MAXIPIME) 2 g in sodium chloride 0.9 % 100 mL IVPB  2 g Intravenous Q12H Sudini, Srikar, MD 200 mL/hr at 11/14/17 1153 2 g at 11/14/17 1153  . insulin aspart (novoLOG) injection 0-15 Units  0-15 Units Subcutaneous TID WC Hillary Bow, MD   3 Units at 11/14/17 1153  . insulin aspart (novoLOG) injection 0-5 Units  0-5 Units Subcutaneous QHS Sudini, Srikar, MD      . ipratropium-albuterol (DUONEB) 0.5-2.5 (3) MG/3ML nebulizer solution 3 mL  3 mL Nebulization Q6H Sudini, Srikar, MD   3 mL at 11/14/17 0801  . linagliptin (TRADJENTA) tablet 5 mg  5 mg  Oral Daily Hillary Bow, MD   5 mg at 11/14/17 0801  . methylPREDNISolone sodium succinate (SOLU-MEDROL) 125 mg/2 mL injection 60 mg  60 mg Intravenous Q12H Hillary Bow, MD   60 mg at 11/14/17 0607  . metoprolol succinate (TOPROL-XL) 24 hr tablet 50 mg  50 mg Oral Daily Sudini, Alveta Heimlich, MD   50 mg at 11/14/17 0800  . mometasone-formoterol (DULERA) 100-5 MCG/ACT inhaler 2 puff  2 puff Inhalation q12n4p Hillary Bow, MD   2 puff at 11/14/17 1154  . ondansetron (ZOFRAN)  tablet 4 mg  4 mg Oral Q6H PRN Hillary Bow, MD       Or  . ondansetron (ZOFRAN) injection 4 mg  4 mg Intravenous Q6H PRN Sudini, Alveta Heimlich, MD      . oxyCODONE (Oxy IR/ROXICODONE) immediate release tablet 10 mg  10 mg Oral Q8H PRN Sudini, Srikar, MD      . polyethylene glycol (MIRALAX / GLYCOLAX) packet 17 g  17 g Oral Daily PRN Sudini, Srikar, MD      . primidone (MYSOLINE) tablet 50 mg  50 mg Oral Daily Sudini, Alveta Heimlich, MD   50 mg at 11/14/17 0800  . simvastatin (ZOCOR) tablet 10 mg  10 mg Oral QPM Hillary Bow, MD   10 mg at 11/13/17 1805  . vancomycin (VANCOCIN) 1,500 mg in sodium chloride 0.9 % 500 mL IVPB  1,500 mg Intravenous Q24H Hillary Bow, MD   Stopped at 11/13/17 2356   Facility-Administered Medications Ordered in Other Encounters  Medication Dose Route Frequency Provider Last Rate Last Dose  . sodium chloride flush (NS) 0.9 % injection 10 mL  10 mL Intravenous PRN Sindy Guadeloupe, MD   10 mL at 01/30/17 0958      .  PHYSICAL EXAMINATION:  Vitals:   11/14/17 0701 11/14/17 0733  BP:  103/68  Pulse:  92  Resp:  20  Temp:    SpO2: 98% 97%   Filed Weights   11/13/17 0951  Weight: 195 lb (88.5 kg)    GENERAL: Well-nourished well-developed; Alert, no distress and comfortable.   He is resting the chair.  Accompanied by his wife.  On 3 L of oxygen. EYES: no pallor or icterus OROPHARYNX: no thrush or ulceration. NECK: supple, no masses felt LYMPH:  no palpable lymphadenopathy in the cervical, axillary  or inguinal regions LUNGS: decreased breath sounds to auscultation at bases and  No wheeze or crackles HEART/CVS: regular rate & rhythm and no murmurs; No lower extremity edema ABDOMEN: abdomen soft, non-tender and normal bowel sounds Musculoskeletal:no cyanosis of digits and no clubbing  PSYCH: alert & oriented x 3 with fluent speech NEURO: no focal motor/sensory deficits SKIN:  no rashes or significant lesions  LABORATORY DATA:  I have reviewed the data as listed Lab Results  Component Value Date   WBC 7.7 11/14/2017   HGB 13.5 11/14/2017   HCT 40.7 11/14/2017   MCV 87.7 11/14/2017   PLT 217 11/14/2017   Recent Labs    10/10/17 0042  10/15/17 1450 11/13/17 0959 11/14/17 0413  NA 138   < > 135 135 136  K 3.7   < > 4.2 4.9 4.7  CL 103   < > 103 101 102  CO2 26   < > _0 GLUCOSE 161*   < > 213* 148* 157*  BUN 13   < > 23* 15 23*  CREATININE 1.58*   < > 1.50* 1.44* 1.47*  CALCIUM 9.7   < > 9.7 9.5 10.0  GFRNONAA 41*   < > 44* 46* 45*  GFRAA 48*   < > 51* 53* 52*  PROT 8.5*  --  7.6 8.9*  --   ALBUMIN 3.2*  --  3.1* 3.1*  --   AST 22  --  18 26  --   ALT 19  --  23 18  --   ALKPHOS 92  --  107 95  --   BILITOT 0.5  --  0.4 0.9  --    < > =  values in this interval not displayed.    RADIOGRAPHIC STUDIES: I have personally reviewed the radiological images as listed and agreed with the findings in the report. Dg Chest 2 View  Result Date: 11/13/2017 CLINICAL DATA:  Shortness of breath. Known squamous cell lung carcinoma EXAM: CHEST - 2 VIEW COMPARISON:  Chest radiograph October 10, 2017 and chest CT September 07, 2017 FINDINGS: The pulmonary nodular lesion on the right has increased in size, currently measuring 2.6 x 2.3 cm. There is a right pleural effusion with consolidation in portions of the right middle and lower lobes. There is scarring in the right mid lung region. On the left, there is extensive bullous disease with areas of scarring. No consolidation noted on the  left. Heart size normal. There is distortion of pulmonary vascularity bilaterally due to the underlying COPD. No adenopathy is likely aided on radiography. There is degenerative change in the thoracic spine. Port-A-Cath tip is in the superior vena cava. There is aortic atherosclerosis. IMPRESSION: 1. Pulmonary nodular lesion right upper lobe has increased in size compared to status. 2. There is no pleural effusion on the right with consolidation in portions of the right middle and lower lobes. 3. 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 Areas of scarring in the right mid lung and left base regions. 4.  Stable cardiac silhouette.  There is aortic atherosclerosis. 5.  Port-A-Cath tip in superior vena cava. Aortic Atherosclerosis (ICD10-I70.0) and Emphysema (ICD10-J43.9). Electronically Signed   By: Lowella Grip III M.D.   On: 11/13/2017 10:17   Ct Angio Chest Pe W And/or Wo Contrast  Result Date: 11/13/2017 CLINICAL DATA:  Shortness of breath.  History of lung carcinoma EXAM: CT ANGIOGRAPHY CHEST WITH CONTRAST TECHNIQUE: Multidetector CT imaging of the chest was performed using the standard protocol during bolus administration of intravenous contrast. Multiplanar CT image reconstructions and MIPs were obtained to evaluate the vascular anatomy. CONTRAST:  48m ISOVUE-370 IOPAMIDOL (ISOVUE-370) INJECTION 76% COMPARISON:  Chest CT September 07, 2017; chest radiograph Nov 13, 2017 FINDINGS: Cardiovascular: There is no appreciable pulmonary embolus. There is no appreciable thoracic aortic dissection. Ascending thoracic aorta measures 4.0 x 4.0 cm, stable. Visualized great vessels appear normal except for minimal calcification in the proximal left subclavian artery. There are foci of aortic atherosclerosis. There are foci of coronary artery calcification. There is no appreciable pericardial effusion or pericardial thickening. Port-A-Cath tip is in the superior vena cava near the cavoatrial junction. Main pulmonary  outflow tract measures 3.1 cm. Mediastinum/Nodes: Thyroid appears unremarkable. There is a prominent left axillary lymph node measuring 1.5 x 1.5 cm. No other lymph node prominence is noted in the axillary regions. There is no lymph node enlargement appreciable elsewhere in the thoracic region. No esophageal lesions are evident. Lungs/Pleura: There is a large bulla on the left occupying much of the left lung. There are multiple smaller bullae throughout the left lung. There also bullae in the right upper lobe. There is extensive cicatrization bilaterally, particularly in the remaining left upper lobe. There is a sizable right pleural effusion with consolidation in much of the right lower lobe. There is also consolidation in a portion of the anterior segment right upper lobe more medially. There is a mass in the posterior segment of the right upper lobe measuring 2.4 x 2.1 x 2.4 cm. Upper Abdomen: There is a mass lesion in the posterior segment of the right lobe of the liver measuring 3.1 x 2.8 cm. There is incomplete visualization of a second mass in  the liver, anterior segment right lobe region, measuring 2.2 x 2.1 cm. There may be additional lesions incompletely visualized in the right lobe slightly more inferiorly. There is incomplete visualization of a cyst arising from the upper pole the right kidney measuring 6.4 x 6.3 cm. No adrenal lesions are appreciable. There is aortic atherosclerosis. Musculoskeletal: There is extensive degenerative change in the thoracic spine with diffuse idiopathic skeletal hyperostosis. No blastic or lytic bone lesions are evident. Review of the MIP images confirms the above findings. IMPRESSION: 1.  No pulmonary embolus. 2. Mass in the posterior segment of the right upper lobe has increased in size. 3. Sizable right pleural effusion with consolidation in much of the right upper lobe. There is also consolidation in the medial aspect of the anterior segment right upper lobe. 4.  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 Areas of cicatrization noted bilaterally. 5. Enlarged left axillary lymph node, suspicious for neoplastic etiology. 6. Liver lesions, incompletely visualized, felt to represent metastatic foci. 7. Ascending thoracic aorta measures 4.0 x 4.0 cm, stable. No dissection evident. Recommend annual imaging followup by CTA or MRA. This recommendation follows 2010 ACCF/AHA/AATS/ACR/ASA/SCA/SCAI/SIR/STS/SVM Guidelines for the Diagnosis and Management of Patients with Thoracic Aortic Disease. Circulation. 2010; 121: G956-O130. 8. Prominence of the main pulmonary outflow tract, indicative of a degree of pulmonary arterial hypertension. Aortic Atherosclerosis (ICD10-I70.0) and Emphysema (ICD10-J43.9). Electronically Signed   By: Lowella Grip III M.D.   On: 11/13/2017 12:17    ASSESSMENT & PLAN:   #77 year old male patient with a history of stage III lung cancer; with contralateral right lung nodule-he is currently admitted the hospital for worsening shortness of breath/cough.  #Stage III lung cancer-squamous cell carcinoma status post chemoradiation; contralateral right lung nodule approximately 2.5 cm in size-awaiting SBRT [on 5/6].  However CT scan done in the emergency room-concerning for liver lesions; left axillary adenopathy; moderate-sized pleural effusion-which is concerning for widespread disease Runell Gess was not the case on a prior CT scan in February 2019].  Will reach out to radiation oncology in the morning/Dr. Rao-regarding the next plan of care in light of above findings.  Patient will likely need systemic therapy.  Also likely need a PET scan for further confirmation.  #Right-sided pleural effusion-moderate; if patient's respiratory status does not improve-if patient could be tapped; also to rule out malignant effusion.   #COPD/pneumonia-currently on antibiotics/improving.  Thank you Dr.Gouru for allowing  me to participate in the care of your pleasant patient. Please do not hesitate to contact me with questions or concerns in the interim. Discussed with Dr.Goruru.  Also discussed at length/with the patient/patient's wife in detail.  All questions were answered. The patient knows to call the clinic with any problems, questions or concerns.    Cammie Sickle, MD 11/14/2017 12:06 PM

## 2017-11-14 NOTE — Progress Notes (Signed)
Pt code status changed to Full Code per pt request.  Jacqulyn Bath [facility] 11/14/2017, 9:13 PM

## 2017-11-14 NOTE — Progress Notes (Signed)
Per patient and wife request, MD notified to change code status from DNR to full code. Orders changed per MD.

## 2017-11-14 NOTE — Progress Notes (Signed)
[facility] [facility] [place] NAME: Chris Wilkinson    MR#:  [id number]  DATE OF BIRTH:  [birth year]  SUBJECTIVE:  CHIEF COMPLAINT: Patient reports shortness of breath is better than yesterday.  Intermittent episodes of cough.  Scheduled for radiation therapy on Monday regarding his lung cancer  REVIEW OF SYSTEMS:  CONSTITUTIONAL: No fever, fatigue or weakness.  EYES: No blurred or double vision.  EARS, NOSE, AND THROAT: No tinnitus or ear pain.  RESPIRATORY: Reports intermittent episodes of cough, improving shortness of breath, denies wheezing or hemoptysis.  CARDIOVASCULAR: No chest pain, orthopnea, edema.  GASTROINTESTINAL: No nausea, vomiting, diarrhea or abdominal pain.  GENITOURINARY: No dysuria, hematuria.  ENDOCRINE: No polyuria, nocturia,  HEMATOLOGY: No anemia, easy bruising or bleeding SKIN: No rash or lesion. MUSCULOSKELETAL: No joint pain or arthritis.   NEUROLOGIC: No tingling, numbness, weakness.  PSYCHIATRY: No anxiety or depression.   DRUG ALLERGIES:  No Known Allergies  VITALS:  Blood pressure 103/68, pulse 92, temperature 98 F (36.7 C), temperature source Oral, resp. rate 20, height 5\' 11"  (1.803 m), weight 88.5 kg (195 lb), SpO2 97 %.  PHYSICAL EXAMINATION:  GENERAL:  77 y.o.-year-old patient lying in the bed with no acute distress.  EYES: Pupils equal, round, reactive to light and accommodation. No scleral icterus. Extraocular muscles intact.  HEENT: Head atraumatic, normocephalic. Oropharynx and nasopharynx clear.  NECK:  Supple, no jugular venous distention. No thyroid enlargement, no tenderness.  LUNGS:  diminished breath sounds bilaterally, no wheezing, rales,rhonchi .  Has  crepitation. No use of accessory muscles of respiration.  CARDIOVASCULAR: S1, S2 normal. No murmurs, rubs, or gallops.  ABDOMEN: Soft, nontender, nondistended. Bowel sounds present. No organomegaly or mass.  EXTREMITIES: No pedal edema,  cyanosis, or clubbing.  NEUROLOGIC: Cranial nerves II through XII are intact. Muscle strength 5/5 in all extremities. Sensation intact. Gait not checked.  PSYCHIATRIC: The patient is alert and oriented x 3.  SKIN: No obvious rash, lesion, or ulcer.    LABORATORY PANEL:   CBC Recent Labs  Lab 11/14/17 0413  WBC 7.7  HGB 13.5  HCT 40.7  PLT 217   ------------------------------------------------------------------------------------------------------------------  Chemistries  Recent Labs  Lab 11/13/17 0959 11/14/17 0413  NA 135 136  K 4.9 4.7  CL 101 102  CO2 27 25  GLUCOSE 148* 157*  BUN 15 23*  CREATININE 1.44* 1.47*  CALCIUM 9.5 10.0  AST 26  --   ALT 18  --   ALKPHOS 95  --   BILITOT 0.9  --    ------------------------------------------------------------------------------------------------------------------  Cardiac Enzymes Recent Labs  Lab 11/13/17 0959  TROPONINI 0.33*   ------------------------------------------------------------------------------------------------------------------  RADIOLOGY:  Dg Chest 2 View  Result Date: 11/13/2017 CLINICAL DATA:  Shortness of breath. Known squamous cell lung carcinoma EXAM: CHEST - 2 VIEW COMPARISON:  Chest radiograph October 10, 2017 and chest CT September 07, 2017 FINDINGS: The pulmonary nodular lesion on the right has increased in size, currently measuring 2.6 x 2.3 cm. There is a right pleural effusion with consolidation in portions of the right middle and lower lobes. There is scarring in the right mid lung region. On the left, there is extensive bullous disease with areas of scarring. No consolidation noted on the left. Heart size normal. There is distortion of pulmonary vascularity bilaterally due to the underlying COPD. No adenopathy is likely aided on radiography. There is degenerative change in the thoracic spine. Port-A-Cath tip is in the superior vena cava. There is  aortic atherosclerosis. IMPRESSION: 1. Pulmonary  nodular lesion right upper lobe has increased in size compared to status. 2. There is no pleural effusion on the right with consolidation in portions of the right middle and lower lobes. 3. 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 Areas of scarring in the right mid lung and left base regions. 4.  Stable cardiac silhouette.  There is aortic atherosclerosis. 5.  Port-A-Cath tip in superior vena cava. Aortic Atherosclerosis (ICD10-I70.0) and Emphysema (ICD10-J43.9). Electronically Signed   By: Lowella Grip III M.D.   On: 11/13/2017 10:17   Ct Angio Chest Pe W And/or Wo Contrast  Result Date: 11/13/2017 CLINICAL DATA:  Shortness of breath.  History of lung carcinoma EXAM: CT ANGIOGRAPHY CHEST WITH CONTRAST TECHNIQUE: Multidetector CT imaging of the chest was performed using the standard protocol during bolus administration of intravenous contrast. Multiplanar CT image reconstructions and MIPs were obtained to evaluate the vascular anatomy. CONTRAST:  1mL ISOVUE-370 IOPAMIDOL (ISOVUE-370) INJECTION 76% COMPARISON:  Chest CT September 07, 2017; chest radiograph Nov 13, 2017 FINDINGS: Cardiovascular: There is no appreciable pulmonary embolus. There is no appreciable thoracic aortic dissection. Ascending thoracic aorta measures 4.0 x 4.0 cm, stable. Visualized great vessels appear normal except for minimal calcification in the proximal left subclavian artery. There are foci of aortic atherosclerosis. There are foci of coronary artery calcification. There is no appreciable pericardial effusion or pericardial thickening. Port-A-Cath tip is in the superior vena cava near the cavoatrial junction. Main pulmonary outflow tract measures 3.1 cm. Mediastinum/Nodes: Thyroid appears unremarkable. There is a prominent left axillary lymph node measuring 1.5 x 1.5 cm. No other lymph node prominence is noted in the axillary regions. There is no lymph node enlargement appreciable elsewhere in the thoracic region. No esophageal lesions  are evident. Lungs/Pleura: There is a large bulla on the left occupying much of the left lung. There are multiple smaller bullae throughout the left lung. There also bullae in the right upper lobe. There is extensive cicatrization bilaterally, particularly in the remaining left upper lobe. There is a sizable right pleural effusion with consolidation in much of the right lower lobe. There is also consolidation in a portion of the anterior segment right upper lobe more medially. There is a mass in the posterior segment of the right upper lobe measuring 2.4 x 2.1 x 2.4 cm. Upper Abdomen: There is a mass lesion in the posterior segment of the right lobe of the liver measuring 3.1 x 2.8 cm. There is incomplete visualization of a second mass in the liver, anterior segment right lobe region, measuring 2.2 x 2.1 cm. There may be additional lesions incompletely visualized in the right lobe slightly more inferiorly. There is incomplete visualization of a cyst arising from the upper pole the right kidney measuring 6.4 x 6.3 cm. No adrenal lesions are appreciable. There is aortic atherosclerosis. Musculoskeletal: There is extensive degenerative change in the thoracic spine with diffuse idiopathic skeletal hyperostosis. No blastic or lytic bone lesions are evident. Review of the MIP images confirms the above findings. IMPRESSION: 1.  No pulmonary embolus. 2. Mass in the posterior segment of the right upper lobe has increased in size. 3. Sizable right pleural effusion with consolidation in much of the right upper lobe. There is also consolidation in the medial aspect of the anterior segment right upper lobe. 4. 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 Areas of cicatrization noted bilaterally. 5. Enlarged left axillary lymph node, suspicious for  neoplastic etiology. 6. Liver lesions, incompletely visualized, felt to represent metastatic foci. 7. Ascending thoracic aorta measures  4.0 x 4.0 cm, stable. No dissection evident. Recommend annual imaging followup by CTA or MRA. This recommendation follows 2010 ACCF/AHA/AATS/ACR/ASA/SCA/SCAI/SIR/STS/SVM Guidelines for the Diagnosis and Management of Patients with Thoracic Aortic Disease. Circulation. 2010; 121: Z660-Y301. 8. Prominence of the main pulmonary outflow tract, indicative of a degree of pulmonary arterial hypertension. Aortic Atherosclerosis (ICD10-I70.0) and Emphysema (ICD10-J43.9). Electronically Signed   By: Lowella Grip III M.D.   On: 11/13/2017 12:17    EKG:   Orders placed or performed during the hospital encounter of 11/13/17  . ED EKG  . ED EKG  . EKG 12-Lead  . EKG 12-Lead  . EKG 12-Lead  . EKG 12-Lead  . EKG 12-Lead  . EKG 12-Lead  . EKG    ASSESSMENT AND PLAN:   * Acute right-sided hospital acquired pneumonia with COPD exacerbation and acute on chronic hypoxic respiratory failure  clinically somewhat better.   Continue IV cefepime and vancomycin.   Follow-up on blood and sputum cultures. Wean oxygen as tolerated.  Continue supportive treatment IV steroids, nebulizers.  Inhalers from home. Patient is immunocompromised.  *Right lung cancer stage III.    Squamous cell carcinoma status post chemotherapy  patient had left-sided lung cancer and had resection and was in remission.  Right-sided lung cancer is a new diagnosis.  He is due for radiation treatment on Monday.  Follows with Dr. Janese Banks in the cancer center. Seen by oncology Dr. Yevette Edwards, appreciate his recommendations, he will reach out radiation oncology/Dr. Janese Banks regarding the next plan of care including radiation therapy on Monday  *Diabetes mellitus.  Sliding scale insulin.  Home medication.  *Chronic diastolic congestive heart failure.  Continue home medications.  History of pulmonary embolism.  On anticoagulation Eliquis        All the records are reviewed and case discussed with Care Management/Social  Workerr. Management plans discussed with the patient, wife at bedside and they are in agreement.  CODE STATUS: DNR  TOTAL TIME TAKING CARE OF THIS PATIENT: 36 minutes.   POSSIBLE D/C IN 2DAYS, DEPENDING ON CLINICAL CONDITION.  Note: This dictation was prepared with Dragon dictation along with smaller phrase technology. Any transcriptional errors that result from this process are unintentional.   [name] M.D on 11/14/2017 at 4:08 PM  Between 7am to 6pm - Pager - [phone] After 6pm go to [email or web address] - password [email or web address]  [facility]  Office  [phone]  CC: Primary care physician; Danelle Berry, NP

## 2017-11-14 NOTE — Progress Notes (Signed)
PHARMACY - PHYSICIAN COMMUNICATION CRITICAL VALUE ALERT - BLOOD CULTURE IDENTIFICATION (BCID)  Chris Wilkinson is an 77 y.o. male who presented to [facility] on 11/13/2017 with a chief complaint of   Assessment:  1/4 Staph spp mec A(-) (include suspected source if known)  Name of physician (or Provider) Contacted: Marcille Blanco  Current antibiotics: vanc/cefepime  Changes to prescribed antibiotics recommended:  n/a  Results for orders placed or performed during the hospital encounter of 11/13/17  Blood Culture ID Panel (Reflexed) (Collected: 11/13/2017 10:58 AM)  Result Value Ref Range   Enterococcus species NOT DETECTED NOT DETECTED   Listeria monocytogenes NOT DETECTED NOT DETECTED   Staphylococcus species DETECTED (A) NOT DETECTED   Staphylococcus aureus NOT DETECTED NOT DETECTED   Methicillin resistance NOT DETECTED NOT DETECTED   Streptococcus species NOT DETECTED NOT DETECTED   Streptococcus agalactiae NOT DETECTED NOT DETECTED   Streptococcus pneumoniae NOT DETECTED NOT DETECTED   Streptococcus pyogenes NOT DETECTED NOT DETECTED   Acinetobacter baumannii NOT DETECTED NOT DETECTED   Enterobacteriaceae species NOT DETECTED NOT DETECTED   Enterobacter cloacae complex NOT DETECTED NOT DETECTED   Escherichia coli NOT DETECTED NOT DETECTED   Klebsiella oxytoca NOT DETECTED NOT DETECTED   Klebsiella pneumoniae NOT DETECTED NOT DETECTED   Proteus species NOT DETECTED NOT DETECTED   Serratia marcescens NOT DETECTED NOT DETECTED   Haemophilus influenzae NOT DETECTED NOT DETECTED   Neisseria meningitidis NOT DETECTED NOT DETECTED   Pseudomonas aeruginosa NOT DETECTED NOT DETECTED   Candida albicans NOT DETECTED NOT DETECTED   Candida glabrata NOT DETECTED NOT DETECTED   Candida krusei NOT DETECTED NOT DETECTED   Candida parapsilosis NOT DETECTED NOT DETECTED   Candida tropicalis NOT DETECTED NOT DETECTED    Barbie Croston S 11/14/2017  4:37 AM

## 2017-11-15 LAB — GLUCOSE, CAPILLARY
GLUCOSE-CAPILLARY: 132 mg/dL — AB (ref 65–99)
GLUCOSE-CAPILLARY: 158 mg/dL — AB (ref 65–99)
Glucose-Capillary: 134 mg/dL — ABNORMAL HIGH (ref 65–99)
Glucose-Capillary: 133 mg/dL — ABNORMAL HIGH (ref 65–99)

## 2017-11-15 LAB — VANCOMYCIN, TROUGH: Vancomycin Tr: 18 ug/mL (ref 15–20)

## 2017-11-15 MED ORDER — IPRATROPIUM-ALBUTEROL 0.5-2.5 (3) MG/3ML IN SOLN
3.0000 mL | Freq: Three times a day (TID) | RESPIRATORY_TRACT | Status: DC
  Administered 2017-11-15: 3 mL via RESPIRATORY_TRACT
  Filled 2017-11-15: qty 3

## 2017-11-15 MED ORDER — IPRATROPIUM-ALBUTEROL 0.5-2.5 (3) MG/3ML IN SOLN: 3.0000 mL | Freq: Four times a day (QID) | RESPIRATORY_TRACT | Status: DC | PRN

## 2017-11-15 MED ORDER — IPRATROPIUM-ALBUTEROL 0.5-2.5 (3) MG/3ML IN SOLN
3.0000 mL | Freq: Two times a day (BID) | RESPIRATORY_TRACT | Status: DC
  Administered 2017-11-15 – 2017-11-16 (×2): 3 mL via RESPIRATORY_TRACT
  Filled 2017-11-15: qty 3

## 2017-11-15 NOTE — Progress Notes (Signed)
Pharmacy Antibiotic Note  Chris Wilkinson is a 77 y.o. male admitted on 11/13/2017 with pneumonia/HCAP.  Pharmacy has been consulted for vancomycin and cefepime dosing. He has several recent admissions, most recently for a COPD exacerbation. There is no history of sustained vancomycin therapy to guide dosing beyond calculations.  Plan: 5/5 1938 VT 18. Continue current dosing of Vancomycin 1500mg  IV every 24 hours. Will recheck in 2-3 days.   Continue Cefepime dose renally adjusted for HCAP at 2gram q12h  Height: 5\' 11"  (180.3 cm) Weight: 195 lb (88.5 kg) IBW/kg (Calculated) : 75.3  Temp (24hrs), Avg:97.8 F (36.6 C), Min:97.6 F (36.4 C), Max:98 F (36.7 C)  Recent Labs  Lab 11/13/17 0959 11/13/17 1058 11/14/17 0413 11/15/17 1938  WBC 7.1  --  7.7  --   CREATININE 1.44*  --  1.47*  --   LATICACIDVEN  --  1.7  --   --   VANCOTROUGH  --   --   --  18    Estimated Creatinine Clearance: 46.2 mL/min (A) (by C-G formula based on SCr of 1.47 mg/dL (H)).    No Known Allergies  Antimicrobials this admission: Vancomycin 5/3 >>  Cefepime 5/3 >>  Microbiology results: 5/2 BCx: Staph Species  5/3 MRSA PCR: Neg   Thank you for allowing pharmacy to be a part of this patient's care.  Pernell Dupre, PharmD, BCPS Clinical Pharmacist 11/15/2017 8:36 PM

## 2017-11-15 NOTE — Progress Notes (Signed)
Chris Wilkinson   DOB:12/21/1940   VH#:[id number]    Subjective: Patient feels his breathing is improved.  He denies any worsening cough.  Mild chronic cough.  No hemoptysis.  He has been walking to the bathroom.  He is eager to go home.  Denies any swelling in the legs.  Denies any nausea vomiting.  He is accompanied by his wife.  ROS: No diarrhea constipation.  Objective:  Vitals:   11/15/17 0015 11/15/17 0811  BP: 106/67 110/69  Pulse: 85 86  Resp: 20   Temp: 97.6 F (36.4 C) 97.8 F (36.6 C)  SpO2: 99% 93%     Intake/Output Summary (Last 24 hours) at 11/15/2017 1317 Last data filed at 11/15/2017 0900 Gross per 24 hour  Intake 1420 ml  Output 500 ml  Net 920 ml    GENERAL: Well-nourished well-developed; Alert, no distress and comfortable.   He is resting the chair.  Accompanied by his wife.  On 3 L of oxygen. EYES: no pallor or icterus OROPHARYNX: no thrush or ulceration. NECK: supple, no masses felt LYMPH:  no palpable lymphadenopathy in the cervical, axillary or inguinal regions LUNGS: decreased breath sounds to auscultation at bases and  No wheeze or crackles HEART/CVS: regular rate & rhythm and no murmurs; No lower extremity edema ABDOMEN: abdomen soft, non-tender and normal bowel sounds Musculoskeletal:no cyanosis of digits and no clubbing  PSYCH: alert & oriented x 3 with fluent speech NEURO: no focal motor/sensory deficits SKIN:  no rashes or significant lesions   Labs:  Lab Results  Component Value Date   WBC 7.7 11/14/2017   HGB 13.5 11/14/2017   HCT 40.7 11/14/2017   MCV 87.7 11/14/2017   PLT 217 11/14/2017   NEUTROABS 6.9 (H) 10/15/2017    Lab Results  Component Value Date   NA 136 11/14/2017   K 4.7 11/14/2017   CL 102 11/14/2017   CO2 25 11/14/2017    Studies:  No results found.  Assessment & Plan:   #77 year old male patient with a history of stage III lung cancer; with contralateral right lung nodule-he is currently admitted the hospital for  worsening shortness of breath/cough.  # Stage III lung cancer-squamous cell carcinoma status post chemoradiation; contralateral right lung nodule approximately 2.5 cm in size-awaiting SBRT [on 5/6].  However CT scan done in the emergency room-concerning for liver lesions; left axillary adenopathy; moderate-sized pleural effusion-which is concerning for widespread disease.  If patient does truly have systemic disease [might need a PET scan for further evaluation]-patient might benefit from systemic therapy.  However I would defer final recommendations -to patient's primary oncologist Dr. Rao/radiation oncology Dr. Donella Stade.  Patient recommended to keep the appointment with Dr. Donella Stade tomorrow if discharged today.  # COPD exacerbation/pneumonia- chronic respiratory failure-24 x 7 home O2-status post steroids antibiotics-improved.  Patient feels he is at baseline respiratory status.  Antibiotics could be switched to Levaquin for total of 7 days; also steroids taper.  #The above plan of care was discussed with the patient/wife in detail.  Also informed Dr. Margaretmary Eddy of my recommendations.  Cammie Sickle, MD 11/15/2017  1:17 PM

## 2017-11-15 NOTE — Progress Notes (Signed)
[place] [facility] [place] NAME: Chris Wilkinson    MR#:  [id number]  DATE OF BIRTH:  08-16-1940  SUBJECTIVE:  CHIEF COMPLAINT: Patient reports shortness of breath with exertion   Scheduled for radiation therapy on Monday regarding his lung cancer  REVIEW OF SYSTEMS:  CONSTITUTIONAL: No fever, fatigue or weakness.  EYES: No blurred or double vision.  EARS, NOSE, AND THROAT: No tinnitus or ear pain.  RESPIRATORY: Reports intermittent episodes of cough, improving shortness of breath, denies wheezing or hemoptysis.  CARDIOVASCULAR: No chest pain, orthopnea, edema.  GASTROINTESTINAL: No nausea, vomiting, diarrhea or abdominal pain.  GENITOURINARY: No dysuria, hematuria.  ENDOCRINE: No polyuria, nocturia,  HEMATOLOGY: No anemia, easy bruising or bleeding SKIN: No rash or lesion. MUSCULOSKELETAL: No joint pain or arthritis.   NEUROLOGIC: No tingling, numbness, weakness.  PSYCHIATRY: No anxiety or depression.   DRUG ALLERGIES:  No Known Allergies  VITALS:  Blood pressure 90/61, pulse 89, temperature 98 F (36.7 C), temperature source Oral, resp. rate 20, height 5\' 11"  (1.803 m), weight 88.5 kg (195 lb), SpO2 98 %.  PHYSICAL EXAMINATION:  GENERAL:  77 y.o.-year-old patient lying in the bed with no acute distress.  EYES: Pupils equal, round, reactive to light and accommodation. No scleral icterus. Extraocular muscles intact.  HEENT: Head atraumatic, normocephalic. Oropharynx and nasopharynx clear.  NECK:  Supple, no jugular venous distention. No thyroid enlargement, no tenderness.  LUNGS:  diminished breath sounds bilaterally, no wheezing, rales,rhonchi .  Has  crepitation. No use of accessory muscles of respiration.  CARDIOVASCULAR: S1, S2 normal. No murmurs, rubs, or gallops.  ABDOMEN: Soft, nontender, nondistended. Bowel sounds present. No organomegaly or mass.  EXTREMITIES: No pedal edema, cyanosis, or clubbing.  NEUROLOGIC: Cranial nerves  II through XII are intact. Muscle strength 5/5 in all extremities. Sensation intact. Gait not checked.  PSYCHIATRIC: The patient is alert and oriented x 3.  SKIN: No obvious rash, lesion, or ulcer.    LABORATORY PANEL:   CBC Recent Labs  Lab 11/14/17 0413  WBC 7.7  HGB 13.5  HCT 40.7  PLT 217   ------------------------------------------------------------------------------------------------------------------  Chemistries  Recent Labs  Lab 11/13/17 0959 11/14/17 0413  NA 135 136  K 4.9 4.7  CL 101 102  CO2 27 25  GLUCOSE 148* 157*  BUN 15 23*  CREATININE 1.44* 1.47*  CALCIUM 9.5 10.0  AST 26  --   ALT 18  --   ALKPHOS 95  --   BILITOT 0.9  --    ------------------------------------------------------------------------------------------------------------------  Cardiac Enzymes Recent Labs  Lab 11/13/17 0959  TROPONINI 0.33*   ------------------------------------------------------------------------------------------------------------------  RADIOLOGY:  No results found.  EKG:   Orders placed or performed during the hospital encounter of 11/13/17  . ED EKG  . ED EKG  . EKG 12-Lead  . EKG 12-Lead  . EKG 12-Lead  . EKG 12-Lead  . EKG 12-Lead  . EKG 12-Lead  . EKG    ASSESSMENT AND PLAN:   * Acute right-sided hospital acquired pneumonia with COPD exacerbation and acute on chronic hypoxic respiratory failure  clinically somewhat better.   Continue IV cefepime and vancomycin, will change to po levoflox for 7 days at d/c  Follow-up on blood and sputum cultures. Wean oxygen as tolerated.  Continue supportive treatment IV steroids, nebulizers.  Inhalers from home. Patient is immunocompromised.  *Right lung cancer stage III.    Squamous cell carcinoma status post chemotherapy  patient had left-sided lung cancer and had  resection and was in remission.  Right-sided lung cancer is a new diagnosis.  He is due for radiation treatment on Monday.  Follows with Dr.  Janese Banks in the cancer center. Seen by oncology Dr. Yevette Edwards, appreciate his recommendations, will  reach out radiation oncology/Dr. Janese Banks regarding the next plan of care including radiation therapy on Monday  *Diabetes mellitus.  Sliding scale insulin.  Home medication.  *Chronic diastolic congestive heart failure.  Continue home medications.  History of pulmonary embolism.  On anticoagulation Eliquis        All the records are reviewed and case discussed with Care Management/Social Workerr. Management plans discussed with the patient, wife at bedside and they are in agreement.  CODE STATUS: full code  TOTAL TIME TAKING CARE OF THIS PATIENT: 36 minutes.   POSSIBLE D/C IN 1-2 DAYS, DEPENDING ON CLINICAL CONDITION.  Note: This dictation was prepared with Dragon dictation along with smaller phrase technology. Any transcriptional errors that result from this process are unintentional.   [name] M.D on 11/15/2017 at 5:21 PM  Between 7am to 6pm - Pager - [phone] After 6pm go to [email or web address] - password [email or web address] [facility]  [facility]  Office  [phone]  CC: Primary care physician; Danelle Berry, NP

## 2017-11-16 ENCOUNTER — Ambulatory Visit: Payer: Medicare Other

## 2017-11-16 ENCOUNTER — Other Ambulatory Visit: Payer: Self-pay | Admitting: *Deleted

## 2017-11-16 DIAGNOSIS — K769 Liver disease, unspecified: Secondary | ICD-10-CM

## 2017-11-16 DIAGNOSIS — C3490 Malignant neoplasm of unspecified part of unspecified bronchus or lung: Secondary | ICD-10-CM

## 2017-11-16 LAB — CULTURE, BLOOD (ROUTINE X 2)

## 2017-11-16 LAB — GLUCOSE, CAPILLARY: Glucose-Capillary: 122 mg/dL — ABNORMAL HIGH (ref 65–99)

## 2017-11-16 MED ORDER — POLYETHYLENE GLYCOL 3350 17 G PO PACK: 17.0000 g | PACK | Freq: Every day | ORAL | 0 refills | Status: AC | PRN

## 2017-11-16 MED ORDER — PREDNISONE 10 MG (21) PO TBPK: 10.0000 mg | ORAL_TABLET | Freq: Every day | ORAL | 0 refills | Status: DC

## 2017-11-16 MED ORDER — OXYCODONE HCL 10 MG PO TABS: 10.0000 mg | ORAL_TABLET | Freq: Three times a day (TID) | ORAL | 0 refills | Status: DC | PRN

## 2017-11-16 MED ORDER — LEVOFLOXACIN 500 MG PO TABS: 500.0000 mg | ORAL_TABLET | Freq: Every day | ORAL | 0 refills | Status: AC

## 2017-11-16 NOTE — Discharge Summary (Signed)
[name] [facility] [place] NAME: Chris Wilkinson    MR#:  [id number]  DATE OF BIRTH:  10/06/1940  DATE OF ADMISSION:  11/13/2017 ADMITTING PHYSICIAN: Hillary Bow, MD  DATE OF DISCHARGE:  11/16/17  PRIMARY CARE PHYSICIAN: Danelle Berry, NP    ADMISSION DIAGNOSIS:  NSTEMI (non-ST elevated myocardial infarction) ([place]) [I21.4] HCAP (healthcare-associated pneumonia) [J18.9] Acute on chronic respiratory failure with hypoxia ([place]) [J96.21]  DISCHARGE DIAGNOSIS:  Active Problems:   Pneumonia Lung cancer  SECONDARY DIAGNOSIS:   Past Medical History:  Diagnosis Date  . Cancer ([place])    Lung CA  . CHF (congestive heart failure) ([place])   . COPD (chronic obstructive pulmonary disease) ([place])   . Coronary artery disease   . Diabetes mellitus without complication ([place])   . Fibromyalgia   . GERD (gastroesophageal reflux disease)   . Hypertension   . Pulmonary emboli [facility])     HOSPITAL COURSE:  HPI Chris Wilkinson  is a 77 y.o. male with a known history of COPD, chronic respiratory failure, chronic diastolic CHF, PE on anticoagulation, recurrence of his lung cancer on the right side waiting for radiation presents to the emergency room complaining of shortness of breath.  He had a temperature of 100.2 on arrival.  Subjective fevers at home.  Elevated white count.  Chest x-ray/CTA chest showing right-sided pneumonia.  Patient had to be placed on 5 L oxygen.  Baseline is 2 to 3 L at home.    *Acute right-sided hospital acquired pneumonia with COPD exacerbation and acute on chronic hypoxic respiratory failure  clinically better.    Will discharge patient.  Oncology is recommending outpatient PET scan today Continue IV cefepime and vancomycin, will change to po levoflox for 7 days at d/c  Follow-up on blood cultures has revealed coag negative Staphylococcus species which is probably contaminant, MRSA PCR is negative and sputum cultures does not seem  to be collected. Patient chronically lives on 2 to 3 L of oxygen at home continue the same Patient was given treatment with IV steroids will taper to p.o. prednisone and continue inhalers at home  Patient is immunocompromised.  *Elevated troponin from demand ischemia  patient is a symptomatic   *Right lung cancer stage III.   Squamous cell carcinoma status post chemotherapy  patient had left-sided lung cancer and had resection and was in remission. Right-sided lung cancer is a new diagnosis. He is due for radiation treatment on Monday. Follows with Dr. Janese Banks in the cancer center.  Discussed with Dr. Janese Banks recommending outpatient PET scan today after discharge.  Radiation treatment is rescheduled  * ckd-avoid nephrotoxins and outpatient follow-up with nephrologist recommended  *Diabetes mellitus. Sliding scale insulin. Home medication.  *Chronic diastolic congestive heart failure. Continue home medications.  History of pulmonary embolism. On anticoagulation Eliquis   DISCHARGE CONDITIONS:   fair  CONSULTS OBTAINED:  Treatment Team:  Cammie Sickle, MD   PROCEDURES none  DRUG ALLERGIES:  No Known Allergies  DISCHARGE MEDICATIONS:   Allergies as of 11/16/2017   No Known Allergies     Medication List    STOP taking these medications   amoxicillin-clavulanate 875-125 MG tablet Commonly known as:  AUGMENTIN   methylPREDNISolone 4 MG Tbpk tablet Commonly known as:  MEDROL     TAKE these medications   acetaminophen 325 MG tablet Commonly known as:  TYLENOL Take 2 tablets (650 mg total) by mouth every 6 (six) hours as needed for mild pain (  or Fever >/= 101).   amiodarone 200 MG tablet Commonly known as:  PACERONE Take 1 tablet by mouth daily.   apixaban 5 MG Tabs tablet Commonly known as:  ELIQUIS Take 1 tablet (5 mg total) by mouth 2 (two) times daily.   benzonatate 100 MG capsule Commonly known as:  TESSALON Take 1 capsule by mouth 3 (three)  times daily as needed.   ipratropium-albuterol 0.5-2.5 (3) MG/3ML Soln Commonly known as:  DUONEB Take 3 mLs by nebulization every 6 (six) hours as needed.   levofloxacin 500 MG tablet Commonly known as:  LEVAQUIN Take 1 tablet (500 mg total) by mouth daily for 7 days.   metoprolol succinate 50 MG 24 hr tablet Commonly known as:  TOPROL-XL Take 50 mg by mouth daily. Take with or immediately following a meal.   mometasone-formoterol 100-5 MCG/ACT Aero Commonly known as:  DULERA Inhale 2 puffs into the lungs 2 times daily at 12 noon and 4 pm.   NONFORMULARY OR COMPOUNDED ITEM Take 30 mLs by mouth every 6 (six) hours as needed for up to 2 doses. Mix Maalox + 2% Viscous Lidocaine + Donnatal in equal amounts. Take 30 ml PO q6hrs. Do not exceed 3 doses per day.   Oxycodone HCl 10 MG Tabs Take 1 tablet (10 mg total) by mouth every 8 (eight) hours as needed for severe pain. What changed:  medication strength   polyethylene glycol packet Commonly known as:  MIRALAX / GLYCOLAX Take 17 g by mouth daily as needed for mild constipation.   predniSONE 10 MG (21) Tbpk tablet Commonly known as:  STERAPRED UNI-PAK 21 TAB Take 1 tablet (10 mg total) by mouth daily. Take 6 tablets by mouth for 1 day followed by  5 tablets by mouth for 1 day followed by  4 tablets by mouth for 1 day followed by  3 tablets by mouth for 1 day followed by  2 tablets by mouth for 1 day followed by  1 tablet by mouth for a day and stop   primidone 50 MG tablet Commonly known as:  MYSOLINE Take 1 tablet by mouth daily.   simvastatin 10 MG tablet Commonly known as:  ZOCOR Take 10 mg by mouth every evening.   TRADJENTA 5 MG Tabs tablet Generic drug:  linagliptin Take 5 mg by mouth daily.   Vitamin D (Ergocalciferol) 50000 units Caps capsule Commonly known as:  DRISDOL Take 1 capsule by mouth every Thursday.        DISCHARGE INSTRUCTIONS:   Follow-up with primary care physician in 5 to 7 days Follow-up  with Dr. Janese Banks oncology in 3 to 5 days and get PET scan as recommended by her today Outpatient follow-up with nephrology in 3-4 weeks  DIET:  Cardiac diet and Diabetic diet  DISCHARGE CONDITION:  Fair  ACTIVITY:  Activity as tolerated  OXYGEN:  Home Oxygen: Yes.     Oxygen Delivery: 2-3 liters/min via Patient connected to nasal cannula oxygen  DISCHARGE LOCATION:  home   If you experience worsening of your admission symptoms, develop shortness of breath, life threatening emergency, suicidal or homicidal thoughts you must seek medical attention immediately by calling 911 or calling your MD immediately  if symptoms less severe.  You Must read complete instructions/literature along with all the possible adverse reactions/side effects for all the Medicines you take and that have been prescribed to you. Take any new Medicines after you have completely understood and accpet all the possible adverse reactions/side effects.  Please note  You were cared for by a hospitalist during your hospital stay. If you have any questions about your discharge medications or the care you received while you were in the hospital after you are discharged, you can call the unit and asked to speak with the hospitalist on call if the hospitalist that took care of you is not available. Once you are discharged, your primary care physician will handle any further medical issues. Please note that NO REFILLS for any discharge medications will be authorized once you are discharged, as it is imperative that you return to your primary care physician (or establish a relationship with a primary care physician if you do not have one) for your aftercare needs so that they can reassess your need for medications and monitor your lab values.     Today  Chief Complaint  Patient presents with  . Shortness of Breath   Patient is feeling much better today.  He denies any chest pain.  Denies any shortness of breath and wants to  go home chronically lives on 2 to 3 L of oxygen  ROS:  CONSTITUTIONAL: Denies fevers, chills. Denies any fatigue, weakness.  EYES: Denies blurry vision, double vision, eye pain. EARS, NOSE, THROAT: Denies tinnitus, ear pain, hearing loss. RESPIRATORY: Denies cough, wheeze, improved shortness of breath to his baseline CARDIOVASCULAR: Denies chest pain, palpitations, edema.  GASTROINTESTINAL: Denies nausea, vomiting, diarrhea, abdominal pain. Denies bright red blood per rectum. GENITOURINARY: Denies dysuria, hematuria. ENDOCRINE: Denies nocturia or thyroid problems. HEMATOLOGIC AND LYMPHATIC: Denies easy bruising or bleeding. SKIN: Denies rash or lesion. MUSCULOSKELETAL: Denies pain in neck, back, shoulder, knees, hips or arthritic symptoms.  NEUROLOGIC: Denies paralysis, paresthesias.  PSYCHIATRIC: Denies anxiety or depressive symptoms.   VITAL SIGNS:  Blood pressure 112/74, pulse 80, temperature 97.6 F (36.4 C), temperature source Oral, resp. rate 18, height 5\' 11"  (1.803 m), weight 88.5 kg (195 lb), SpO2 97 %.  I/O:    Intake/Output Summary (Last 24 hours) at 11/16/2017 0956 Last data filed at 11/16/2017 0841 Gross per 24 hour  Intake 1080 ml  Output 250 ml  Net 830 ml    PHYSICAL EXAMINATION:  GENERAL:  77 y.o.-year-old patient lying in the bed with no acute distress.  EYES: Pupils equal, round, reactive to light and accommodation. No scleral icterus. Extraocular muscles intact.  HEENT: Head atraumatic, normocephalic. Oropharynx and nasopharynx clear.  NECK:  Supple, no jugular venous distention. No thyroid enlargement, no tenderness.  LUNGS: Coarse breath sounds bilaterally, no wheezing, rales,rhonchi.  Has some crepitation. No use of accessory muscles of respiration.  CARDIOVASCULAR: S1, S2 normal. No murmurs, rubs, or gallops.  ABDOMEN: Soft, non-tender, non-distended. Bowel sounds present. No organomegaly or mass.  EXTREMITIES: No pedal edema, cyanosis, or clubbing.   NEUROLOGIC: Cranial nerves II through XII are intact. Muscle strength 5/5 in all extremities. Sensation intact. Gait not checked.  PSYCHIATRIC: The patient is alert and oriented x 3.  SKIN: No obvious rash, lesion, or ulcer.   DATA REVIEW:   CBC Recent Labs  Lab 11/14/17 0413  WBC 7.7  HGB 13.5  HCT 40.7  PLT 217    Chemistries  Recent Labs  Lab 11/13/17 0959 11/14/17 0413  NA 135 136  K 4.9 4.7  CL 101 102  CO2 27 25  GLUCOSE 148* 157*  BUN 15 23*  CREATININE 1.44* 1.47*  CALCIUM 9.5 10.0  AST 26  --   ALT 18  --   ALKPHOS 95  --  BILITOT 0.9  --     Cardiac Enzymes Recent Labs  Lab 11/13/17 0959  TROPONINI 0.33*    Microbiology Results  Results for orders placed or performed during the hospital encounter of 11/13/17  Blood culture (routine x 2)     Status: Abnormal   Collection Time: 11/13/17 10:58 AM  Result Value Ref Range Status   Specimen Description   Final    BLOOD BLOOD RIGHT HAND Performed at [facility], [address], [place], [place] [address]    Special Requests   Final    BOTTLES DRAWN AEROBIC AND ANAEROBIC Blood Culture results may not be optimal due to an excessive volume of blood received in culture bottles Performed at [facility], [address] [place], [place] [address]    Culture  Setup Time   Final    [place] CRITICAL RESULT CALLED TO, READ BACK BY AND VERIFIED WITH: MATT MCBANE AT 0430 ON 11/14/17 RWW    Culture (A)  Final    STAPHYLOCOCCUS SPECIES (COAGULASE NEGATIVE) THE SIGNIFICANCE OF ISOLATING THIS ORGANISM FROM A SINGLE SET OF BLOOD CULTURES WHEN MULTIPLE SETS ARE DRAWN IS UNCERTAIN. PLEASE NOTIFY THE MICROBIOLOGY DEPARTMENT WITHIN ONE WEEK IF SPECIATION AND SENSITIVITIES ARE REQUIRED. Performed at [facility], [place] [address] [place], [place] [address]    Report Status 11/16/2017 FINAL  Final  Blood Culture ID Panel (Reflexed)     Status: Abnormal    Collection Time: 11/13/17 10:58 AM  Result Value Ref Range Status   Enterococcus species NOT DETECTED NOT DETECTED Final   Listeria monocytogenes NOT DETECTED NOT DETECTED Final   Staphylococcus species DETECTED (A) NOT DETECTED Final    Comment: Methicillin (oxacillin) susceptible coagulase negative staphylococcus. Possible blood culture contaminant (unless isolated from more than one blood culture draw or clinical case suggests pathogenicity). No antibiotic treatment is indicated for blood  culture contaminants. CRITICAL RESULT CALLED TO, READ BACK BY AND VERIFIED WITH: MATT MCBANE AT 0430 ON 11/14/17 RWW    Staphylococcus aureus NOT DETECTED NOT DETECTED Final   Methicillin resistance NOT DETECTED NOT DETECTED Final   Streptococcus species NOT DETECTED NOT DETECTED Final   Streptococcus agalactiae NOT DETECTED NOT DETECTED Final   Streptococcus pneumoniae NOT DETECTED NOT DETECTED Final   Streptococcus pyogenes NOT DETECTED NOT DETECTED Final   Acinetobacter baumannii NOT DETECTED NOT DETECTED Final   Enterobacteriaceae species NOT DETECTED NOT DETECTED Final   Enterobacter cloacae complex NOT DETECTED NOT DETECTED Final   Escherichia coli NOT DETECTED NOT DETECTED Final   Klebsiella oxytoca NOT DETECTED NOT DETECTED Final   Klebsiella pneumoniae NOT DETECTED NOT DETECTED Final   Proteus species NOT DETECTED NOT DETECTED Final   Serratia marcescens NOT DETECTED NOT DETECTED Final   Haemophilus influenzae NOT DETECTED NOT DETECTED Final   Neisseria meningitidis NOT DETECTED NOT DETECTED Final   Pseudomonas aeruginosa NOT DETECTED NOT DETECTED Final   Candida albicans NOT DETECTED NOT DETECTED Final   Candida glabrata NOT DETECTED NOT DETECTED Final   Candida krusei NOT DETECTED NOT DETECTED Final   Candida parapsilosis NOT DETECTED NOT DETECTED Final   Candida tropicalis NOT DETECTED NOT DETECTED Final    Comment: Performed at [facility], [address] [place],  [place] [address]  Blood culture (routine x 2)     Status: None (Preliminary result)   Collection Time: 11/13/17 10:59 AM  Result Value Ref Range Status   Specimen Description BLOOD BLOOD RIGHT FOREARM  Final   Special Requests   Final    BOTTLES DRAWN AEROBIC AND ANAEROBIC Blood Culture results may not be optimal due to an excessive volume of blood received in culture bottles   Culture   Final    NO GROWTH 3 DAYS Performed at [facility], [address], [place], [place] [address]    Report Status PENDING  Incomplete  MRSA PCR Screening     Status: None   Collection Time: 11/13/17  4:06 PM  Result Value Ref Range Status   MRSA by PCR NEGATIVE NEGATIVE Final    Comment:        The GeneXpert MRSA Assay (FDA approved for NASAL specimens only), is one component of a comprehensive MRSA colonization surveillance program. It is not intended to diagnose MRSA infection nor to guide or monitor treatment for MRSA infections. Performed at [facility], [address], [place], [place] [address]     RADIOLOGY:  Dg Chest 2 View  Result Date: 11/13/2017 CLINICAL DATA:  Shortness of breath. Known squamous cell lung carcinoma EXAM: CHEST - 2 VIEW COMPARISON:  Chest radiograph October 10, 2017 and chest CT September 07, 2017 FINDINGS: The pulmonary nodular lesion on the right has increased in size, currently measuring 2.6 x 2.3 cm. There is a right pleural effusion with consolidation in portions of the right middle and lower lobes. There is scarring in the right mid lung region. On the left, there is extensive bullous disease with areas of scarring. No consolidation noted on the left. Heart size normal. There is distortion of pulmonary vascularity bilaterally due to the underlying COPD. No adenopathy is likely aided on radiography. There is degenerative change in the thoracic spine. Port-A-Cath tip is in the superior vena cava. There is aortic atherosclerosis. IMPRESSION: 1. Pulmonary nodular  lesion right upper lobe has increased in size compared to status. 2. There is no pleural effusion on the right with consolidation in portions of the right middle and lower lobes. 3. 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 Areas of scarring in the right mid lung and left base regions. 4.  Stable cardiac silhouette.  There is aortic atherosclerosis. 5.  Port-A-Cath tip in superior vena cava. Aortic Atherosclerosis (ICD10-I70.0) and Emphysema (ICD10-J43.9). Electronically Signed   By: Lowella Grip III M.D.   On: 11/13/2017 10:17   Ct Angio Chest Pe W And/or Wo Contrast  Result Date: 11/13/2017 CLINICAL DATA:  Shortness of breath.  History of lung carcinoma EXAM: CT ANGIOGRAPHY CHEST WITH CONTRAST TECHNIQUE: Multidetector CT imaging of the chest was performed using the standard protocol during bolus administration of intravenous contrast. Multiplanar CT image reconstructions and MIPs were obtained to evaluate the vascular anatomy. CONTRAST:  77mL ISOVUE-370 IOPAMIDOL (ISOVUE-370) INJECTION 76% COMPARISON:  Chest CT September 07, 2017; chest radiograph Nov 13, 2017 FINDINGS: Cardiovascular: There is no appreciable pulmonary embolus. There is no appreciable thoracic aortic dissection. Ascending thoracic aorta measures 4.0 x 4.0 cm, stable. Visualized great vessels appear normal except for minimal calcification in the proximal left subclavian artery. There are foci of aortic atherosclerosis. There are foci of coronary artery calcification. There is no appreciable pericardial effusion or pericardial thickening. Port-A-Cath tip is in the superior vena cava near the cavoatrial junction. Main pulmonary outflow tract measures 3.1 cm. Mediastinum/Nodes: Thyroid appears unremarkable. There is a prominent left axillary lymph node measuring 1.5 x 1.5 cm. No other lymph node prominence is noted in the axillary regions. There is no lymph node enlargement appreciable elsewhere in  the thoracic region. No esophageal lesions are  evident. Lungs/Pleura: There is a large bulla on the left occupying much of the left lung. There are multiple smaller bullae throughout the left lung. There also bullae in the right upper lobe. There is extensive cicatrization bilaterally, particularly in the remaining left upper lobe. There is a sizable right pleural effusion with consolidation in much of the right lower lobe. There is also consolidation in a portion of the anterior segment right upper lobe more medially. There is a mass in the posterior segment of the right upper lobe measuring 2.4 x 2.1 x 2.4 cm. Upper Abdomen: There is a mass lesion in the posterior segment of the right lobe of the liver measuring 3.1 x 2.8 cm. There is incomplete visualization of a second mass in the liver, anterior segment right lobe region, measuring 2.2 x 2.1 cm. There may be additional lesions incompletely visualized in the right lobe slightly more inferiorly. There is incomplete visualization of a cyst arising from the upper pole the right kidney measuring 6.4 x 6.3 cm. No adrenal lesions are appreciable. There is aortic atherosclerosis. Musculoskeletal: There is extensive degenerative change in the thoracic spine with diffuse idiopathic skeletal hyperostosis. No blastic or lytic bone lesions are evident. Review of the MIP images confirms the above findings. IMPRESSION: 1.  No pulmonary embolus. 2. Mass in the posterior segment of the right upper lobe has increased in size. 3. Sizable right pleural effusion with consolidation in much of the right upper lobe. There is also consolidation in the medial aspect of the anterior segment right upper lobe. 4. 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 Areas of cicatrization noted bilaterally. 5. Enlarged left axillary lymph node, suspicious for neoplastic etiology. 6. Liver lesions, incompletely visualized, felt to represent metastatic foci. 7. Ascending thoracic aorta measures 4.0  x 4.0 cm, stable. No dissection evident. Recommend annual imaging followup by CTA or MRA. This recommendation follows 2010 ACCF/AHA/AATS/ACR/ASA/SCA/SCAI/SIR/STS/SVM Guidelines for the Diagnosis and Management of Patients with Thoracic Aortic Disease. Circulation. 2010; 121: D782-U235. 8. Prominence of the main pulmonary outflow tract, indicative of a degree of pulmonary arterial hypertension. Aortic Atherosclerosis (ICD10-I70.0) and Emphysema (ICD10-J43.9). Electronically Signed   By: Lowella Grip III M.D.   On: 11/13/2017 12:17    EKG:   Orders placed or performed during the hospital encounter of 11/13/17  . ED EKG  . ED EKG  . EKG 12-Lead  . EKG 12-Lead  . EKG 12-Lead  . EKG 12-Lead  . EKG 12-Lead  . EKG 12-Lead  . EKG      Management plans discussed with the patient, family and they are in agreement.  CODE STATUS:     Code Status Orders  (From admission, onward)        Start     Ordered   11/14/17 2113  Full code  Continuous     11/14/17 2113    Code Status History    Date Active Date Inactive Code Status Order ID Comments User Context   11/13/2017 1323 11/14/2017 2113 DNR [id number]  Hillary Bow, MD ED   10/10/2017 0246 10/12/2017 1352 DNR [id number]  Hillary Bow, MD ED   09/07/2017 1956 09/08/2017 1612 Full Code [id number]  Vaughan Basta, MD Inpatient   09/23/2016 0048 09/26/2016 2204 Full Code [id number]  Lance Coon, MD Inpatient   08/04/2015 2238 08/05/2015 1528 Full Code [id number]  Lance Coon, MD ED    Advance Directive Documentation  Most Recent Value  Type of Advance Directive  -- [pt states he does not have one]  Pre-existing out of facility DNR order (yellow form or pink MOST form)  -  "MOST" Form in Place?  -      TOTAL TIME TAKING CARE OF THIS PATIENT: 42  minutes.   Note: This dictation was prepared with Dragon dictation along with smaller phrase technology. Any transcriptional errors that result from this process are  unintentional.   @[facility] @  on 11/16/2017 at 9:56 AM  Between 7am to 6pm - Pager - [phone]  After 6pm go to [email or web address] - password [email or web address] [facility]  [place] [facility]  Office  [phone]  CC: Primary care physician; Danelle Berry, NP

## 2017-11-16 NOTE — Care Management Important Message (Signed)
Copy of signed IM left in patient's room.    

## 2017-11-16 NOTE — Care Management (Signed)
Met with patient at bedside. He lives at home with his wife. He is independent with adls.He uses no assistive devices. He does not drive. Wife transports him to and from appointments. He is on 2 l chronically through [place] Patient per patient. He has a tank to go home with. He denies any home care needs. Discuss with attending and it is felt he has no home health needs. He will follow up with oncology. PET scan today prior to discharge. Case closed.

## 2017-11-16 NOTE — Discharge Instructions (Signed)
Follow-up with primary care physician in 5 to 7 days Follow-up with Dr. Janese Banks oncology in 3 to 5 days and get PET scan as recommended by her today Outpatient follow-up with nephrology in 3-4 weeks

## 2017-11-18 ENCOUNTER — Ambulatory Visit: Payer: Medicare Other

## 2017-11-18 LAB — CULTURE, BLOOD (ROUTINE X 2): Culture: NO GROWTH

## 2017-11-19 ENCOUNTER — Telehealth: Payer: Self-pay | Admitting: *Deleted

## 2017-11-19 ENCOUNTER — Inpatient Hospital Stay: Payer: Medicare Other | Admitting: Oncology

## 2017-11-19 NOTE — Telephone Encounter (Signed)
Message left with pt regarding rescheduled appts to next week. Also informed that appts will be mailed as well. Instructed to call with any further questions or needs.

## 2017-11-23 ENCOUNTER — Ambulatory Visit: Payer: Medicare Other

## 2017-11-23 ENCOUNTER — Telehealth: Payer: Self-pay | Admitting: Licensed Clinical Social Worker

## 2017-11-23 NOTE — Telephone Encounter (Signed)
EMMI flagged patient for answering yes to feeling sad/hopeless/anxious/empty. Clinical Education officer, museum (CSW) attempted to contact patient on his home phone and cell phone however he did not answer and voicemails were left.   [employer], LCSW [phone]

## 2017-11-24 NOTE — Telephone Encounter (Signed)
Clinical Education officer, museum (CSW) contacted patient for the second time and was able to reach him via telephone. Per patient he is doing fine and is not feeling depressed or anxious. Patient reported no needs.   [employer], LCSW [phone]

## 2017-11-25 ENCOUNTER — Telehealth: Payer: Self-pay | Admitting: *Deleted

## 2017-11-25 ENCOUNTER — Ambulatory Visit: Payer: Medicare Other

## 2017-11-25 NOTE — Telephone Encounter (Signed)
Pt's wife called in to cancel PET scan scheduled on Thurs 5/16 at 8:30am due to patient feeling too weak. States would like to keep appt on Friday with Dr. Rogue Bussing so patient can discuss options with provider. At this time, pt does not desire treatment but wants to know what the next step will be. Reviewed with pt's wife that palliative care services and hospice services are available if pt does not desire treatment. Pt's wife stated would like for pt to discuss with MD on Friday.   PET scan has been cancelled.   Nothing further needed at this time.

## 2017-11-27 ENCOUNTER — Telehealth: Payer: Self-pay | Admitting: *Deleted

## 2017-11-27 ENCOUNTER — Inpatient Hospital Stay: Payer: Medicare Other | Admitting: Internal Medicine

## 2017-11-27 IMAGING — CT NM PET TUM IMG INITIAL (PI) SKULL BASE T - THIGH
1 of 9 series · 1 of 25 positions shown · non-contrast
Comparison: CT 09/26/2016

CLINICAL DATA: Initial treatment strategy for lung mass. RIGHT lung
mass..

EXAM:
NUCLEAR MEDICINE PET SKULL BASE TO THIGH
TECHNIQUE: 12.3 mCi F-18 FDG was injected intravenously. Full-ring PET imaging
was performed from the skull base to thigh after the radiotracer. CT
data was obtained and used for attenuation correction and anatomic
localization.
FASTING BLOOD GLUCOSE:  Value: 157 mg/dl

[Series 3: ct wb 5.0 b30f · axial · 5.0mm · 0.98mm/px · 1 of 290 slices shown]
[im 1/290  brain]
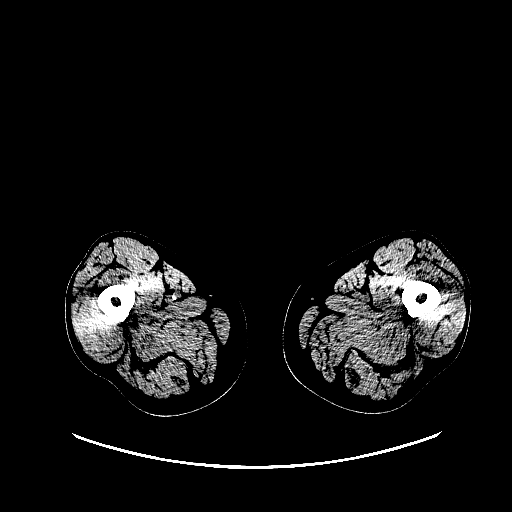

[1 of 25 positions shown; findings below may reference images not displayed]

FINDINGS: NECK

Hypermetabolic RIGHT supraclavicular lymph node is small measuring 7
mm but with high metabolic activity for size (SUV max equals 6.0).

CHEST

Hypermetabolic mass within the RIGHT upper lobe centrally in
suprahilar location. Mass is difficult define on the noncontrast CT
but measures approximately 3 cm. There is postobstructive collapse
of the RIGHT upper lobe distal to this obstructing mass. The mass is
hypermetabolic with SUV max equal 11.8.

Intensely hypermetabolic enlarged RIGHT lower paratracheal lymph
node with SUV max equal 11.0. Hypermetabolic right paratracheal node
and prevascular lymph nodes which are small but intensely metabolic
(SUV max equal 5.0).

Peripheral nodular thickening in the RIGHT lower lobe at 2 locations
with mild metabolic activity. (Image 109 series 3 with SUV max equal
3.7).

Severe bullous change in the LEFT lung.

ABDOMEN/PELVIS

No abnormal hypermetabolic activity within the liver, pancreas,
adrenal glands, or spleen. No hypermetabolic lymph nodes in the
abdomen or pelvis.

SKELETON

No focal hypermetabolic activity to suggest skeletal metastasis.
IMPRESSION: 1. Hypermetabolic RIGHT hilar mass with postobstructive collapse of
the RIGHT upper lobe consistent with primary or metastatic
bronchogenic carcinoma.
2. Hypermetabolic RIGHT paratracheal, prevascular and RIGHT
supraclavicular nodal metastasis.
3. RIGHT supraclavicular node is intensely metabolic but likely too
small for biopsy.
4. RIGHT lower lobe peripheral nodular thickening with associated
metabolic activity is likely inflammatory.
5. Severe bullous change in the LEFT hemithorax

## 2017-11-27 NOTE — Telephone Encounter (Signed)
Wife called to cancel appointment today stating he is too weak to come in

## 2017-11-27 NOTE — Progress Notes (Deleted)
[place] OFFICE PROGRESS NOTE  Patient Care Team: Danelle Berry, NP as PCP - General (Nurse Practitioner)  Cancer Staging Lung cancer [facility]) Staging form: Lung, AJCC 8th Edition - Clinical stage from 10/22/2016: cT1, cN3, cM0 - Signed by Sindy Guadeloupe, MD on 11/20/2016     No history exists.      INTERVAL HISTORY:  Chris Wilkinson 77 y.o.  male pleasant patient above history of  ROS    PAST MEDICAL HISTORY :  Past Medical History:  Diagnosis Date  . Cancer ([place])    Lung CA  . CHF (congestive heart failure) ([place])   . COPD (chronic obstructive pulmonary disease) ([place])   . Coronary artery disease   . Diabetes mellitus without complication ([place])   . Fibromyalgia   . GERD (gastroesophageal reflux disease)   . Hypertension   . Pulmonary emboli ([place])     PAST SURGICAL HISTORY :   Past Surgical History:  Procedure Laterality Date  . COLONOSCOPY WITH PROPOFOL N/A 01/18/2015   Procedure: COLONOSCOPY WITH PROPOFOL;  Surgeon: Manya Silvas, MD;  Location: [facility] ENDOSCOPY;  Service: Endoscopy;  Laterality: N/A;  . ESOPHAGOGASTRODUODENOSCOPY N/A 01/18/2015   Procedure: ESOPHAGOGASTRODUODENOSCOPY (EGD);  Surgeon: Manya Silvas, MD;  Location: [facility] ENDOSCOPY;  Service: Endoscopy;  Laterality: N/A;  . EYE SURGERY    . FLEXIBLE BRONCHOSCOPY N/A 10/22/2016   Procedure: FLEXIBLE BRONCHOSCOPY;  Surgeon: Allyne Gee, MD;  Location: [facility] ORS;  Service: Pulmonary;  Laterality: N/A;  . HERNIA REPAIR    . IR FLUORO GUIDE PORT INSERTION LEFT  11/07/2016  . REPAIR KNEE LIGAMENT    . TONSILLECTOMY      FAMILY HISTORY :   Family History  Problem Relation Age of Onset  . Prostate cancer Father   . Prostate cancer Brother   . Diabetes Unknown        all 13 siblings  . Hypertension Unknown        all 13 siblings    SOCIAL HISTORY:   Social History   Tobacco Use  . Smoking status: Former Smoker    Last attempt to quit: 12/01/2003    Years since quitting: 14.0   . Smokeless tobacco: Never Used  Substance Use Topics  . Alcohol use: Not Currently    Alcohol/week: 1.2 oz    Types: 2 Shots of liquor per week    Frequency: Never    Comment: on the weeknd 2 shots  . Drug use: No    ALLERGIES:  has No Known Allergies.  MEDICATIONS:  Current Outpatient Medications  Medication Sig Dispense Refill  . acetaminophen (TYLENOL) 325 MG tablet Take 2 tablets (650 mg total) by mouth every 6 (six) hours as needed for mild pain (or Fever >/= 101).    Marland Kitchen amiodarone (PACERONE) 200 MG tablet Take 1 tablet by mouth daily.    Marland Kitchen apixaban (ELIQUIS) 5 MG TABS tablet Take 1 tablet (5 mg total) by mouth 2 (two) times daily. 60 tablet 2  . benzonatate (TESSALON) 100 MG capsule Take 1 capsule by mouth 3 (three) times daily as needed.    Marland Kitchen ipratropium-albuterol (DUONEB) 0.5-2.5 (3) MG/3ML SOLN Take 3 mLs by nebulization every 6 (six) hours as needed. 120 mL 3  . metoprolol succinate (TOPROL-XL) 50 MG 24 hr tablet Take 50 mg by mouth daily. Take with or immediately following a meal.    . mometasone-formoterol (DULERA) 100-5 MCG/ACT AERO Inhale 2 puffs into the lungs 2 times daily at  12 noon and 4 pm. 1 Inhaler 4  . NONFORMULARY OR COMPOUNDED ITEM Take 30 mLs by mouth every 6 (six) hours as needed for up to 2 doses. Mix Maalox + 2% Viscous Lidocaine + Donnatal in equal amounts. Take 30 ml PO q6hrs. Do not exceed 3 doses per day. 1 each 0  . oxyCODONE 10 MG TABS Take 1 tablet (10 mg total) by mouth every 8 (eight) hours as needed for severe pain. 30 tablet 0  . polyethylene glycol (MIRALAX / GLYCOLAX) packet Take 17 g by mouth daily as needed for mild constipation. 14 each 0  . predniSONE (STERAPRED UNI-PAK 21 TAB) 10 MG (21) TBPK tablet Take 1 tablet (10 mg total) by mouth daily. Take 6 tablets by mouth for 1 day followed by  5 tablets by mouth for 1 day followed by  4 tablets by mouth for 1 day followed by  3 tablets by mouth for 1 day followed by  2 tablets by mouth for 1 day  followed by  1 tablet by mouth for a day and stop 21 tablet 0  . primidone (MYSOLINE) 50 MG tablet Take 1 tablet by mouth daily.    . simvastatin (ZOCOR) 10 MG tablet Take 10 mg by mouth every evening.     . TRADJENTA 5 MG TABS tablet Take 5 mg by mouth daily.    . Vitamin D, Ergocalciferol, (DRISDOL) 50000 units CAPS capsule Take 1 capsule by mouth every Thursday.     No current facility-administered medications for this visit.    Facility-Administered Medications Ordered in Other Visits  Medication Dose Route Frequency Provider Last Rate Last Dose  . sodium chloride flush (NS) 0.9 % injection 10 mL  10 mL Intravenous PRN Sindy Guadeloupe, MD   10 mL at 01/30/17 0958    PHYSICAL EXAMINATION: ECOG PERFORMANCE STATUS: {CHL ONC ECOG PS:[id number]}  There were no vitals taken for this visit.  There were no vitals filed for this visit.  GENERAL: Well-nourished well-developed; Alert, no distress and comfortable.  *** Alone/Accompanied by family.  EYES: no pallor or icterus OROPHARYNX: no thrush or ulceration; NECK: supple; no lymph nodes felt. LYMPH:  no palpable lymphadenopathy in the axillary or inguinal regions LUNGS: Decreased breath sounds auscultation bilaterally. No wheeze or crackles HEART/CVS: regular rate & rhythm and no murmurs; No lower extremity edema ABDOMEN:abdomen soft, non-tender and normal bowel sounds. No hepatomegaly or splenomegaly.  Musculoskeletal:no cyanosis of digits and no clubbing  PSYCH: alert & oriented x 3 with fluent speech NEURO: no focal motor/sensory deficits SKIN:  no rashes or significant lesions    LABORATORY DATA:  I have reviewed the data as listed    Component Value Date/Time   NA 136 11/14/2017 0413   NA 143 08/12/2017 1411   NA 140 01/17/2014 1703   K 4.7 11/14/2017 0413   K 3.7 01/17/2014 1703   CL 102 11/14/2017 0413   CL 108 (H) 01/17/2014 1703   CO2 25 11/14/2017 0413   CO2 23 01/17/2014 1703   GLUCOSE 157 (H) 11/14/2017 0413    GLUCOSE 95 01/17/2014 1703   BUN 23 (H) 11/14/2017 0413   BUN 18 08/12/2017 1411   BUN 18 01/17/2014 1703   CREATININE 1.47 (H) 11/14/2017 0413   CREATININE 1.44 (H) 01/17/2014 1703   CALCIUM 10.0 11/14/2017 0413   CALCIUM 9.5 01/17/2014 1703   PROT 8.9 (H) 11/13/2017 0959   PROT 7.7 08/12/2017 1411   ALBUMIN 3.1 (L) 11/13/2017 4235  ALBUMIN 3.7 08/12/2017 1411   AST 26 11/13/2017 0959   ALT 18 11/13/2017 0959   ALKPHOS 95 11/13/2017 0959   BILITOT 0.9 11/13/2017 0959   BILITOT 0.3 08/12/2017 1411   GFRNONAA 45 (L) 11/14/2017 0413   GFRNONAA 48 (L) 01/17/2014 1703   GFRAA 52 (L) 11/14/2017 0413   GFRAA 56 (L) 01/17/2014 1703    No results found for: SPEP, UPEP  Lab Results  Component Value Date   WBC 7.7 11/14/2017   NEUTROABS 6.9 (H) 10/15/2017   HGB 13.5 11/14/2017   HCT 40.7 11/14/2017   MCV 87.7 11/14/2017   PLT 217 11/14/2017      Chemistry      Component Value Date/Time   NA 136 11/14/2017 0413   NA 143 08/12/2017 1411   NA 140 01/17/2014 1703   K 4.7 11/14/2017 0413   K 3.7 01/17/2014 1703   CL 102 11/14/2017 0413   CL 108 (H) 01/17/2014 1703   CO2 25 11/14/2017 0413   CO2 23 01/17/2014 1703   BUN 23 (H) 11/14/2017 0413   BUN 18 08/12/2017 1411   BUN 18 01/17/2014 1703   CREATININE 1.47 (H) 11/14/2017 0413   CREATININE 1.44 (H) 01/17/2014 1703      Component Value Date/Time   CALCIUM 10.0 11/14/2017 0413   CALCIUM 9.5 01/17/2014 1703   ALKPHOS 95 11/13/2017 0959   AST 26 11/13/2017 0959   ALT 18 11/13/2017 0959   BILITOT 0.9 11/13/2017 0959   BILITOT 0.3 08/12/2017 1411       RADIOGRAPHIC STUDIES: I have personally reviewed the radiological images as listed and agreed with the findings in the report. No results found.   ASSESSMENT & PLAN:  No problem-specific Assessment & Plan notes found for this encounter.   No orders of the defined types were placed in this encounter.  All questions were answered. The patient knows to call the  clinic with any problems, questions or concerns.      Cammie Sickle, MD 11/27/2017 8:27 AM

## 2017-11-30 ENCOUNTER — Ambulatory Visit: Payer: Medicare Other

## 2017-12-01 ENCOUNTER — Other Ambulatory Visit: Payer: Self-pay

## 2017-12-01 ENCOUNTER — Emergency Department: Payer: Medicare Other

## 2017-12-01 ENCOUNTER — Encounter: Payer: Self-pay | Admitting: Emergency Medicine

## 2017-12-01 ENCOUNTER — Inpatient Hospital Stay
Admission: EM | Admit: 2017-12-01 | Discharge: 2017-12-04 | DRG: 190 | Disposition: A | Discharge status: Home Health | Payer: Medicare Other | Attending: Internal Medicine | Admitting: Internal Medicine

## 2017-12-01 DIAGNOSIS — K219 Gastro-esophageal reflux disease without esophagitis: Secondary | ICD-10-CM | POA: Diagnosis present

## 2017-12-01 DIAGNOSIS — J9690 Respiratory failure, unspecified, unspecified whether with hypoxia or hypercapnia: Secondary | ICD-10-CM | POA: Diagnosis not present

## 2017-12-01 DIAGNOSIS — E119 Type 2 diabetes mellitus without complications: Secondary | ICD-10-CM | POA: Diagnosis present

## 2017-12-01 DIAGNOSIS — J440 Chronic obstructive pulmonary disease with acute lower respiratory infection: Principal | ICD-10-CM | POA: Diagnosis present

## 2017-12-01 DIAGNOSIS — Z87891 Personal history of nicotine dependence: Secondary | ICD-10-CM | POA: Diagnosis not present

## 2017-12-01 DIAGNOSIS — J9621 Acute and chronic respiratory failure with hypoxia: Secondary | ICD-10-CM | POA: Diagnosis present

## 2017-12-01 DIAGNOSIS — I480 Paroxysmal atrial fibrillation: Secondary | ICD-10-CM | POA: Diagnosis present

## 2017-12-01 DIAGNOSIS — Z9221 Personal history of antineoplastic chemotherapy: Secondary | ICD-10-CM

## 2017-12-01 DIAGNOSIS — J441 Chronic obstructive pulmonary disease with (acute) exacerbation: Secondary | ICD-10-CM | POA: Diagnosis present

## 2017-12-01 DIAGNOSIS — M47812 Spondylosis without myelopathy or radiculopathy, cervical region: Secondary | ICD-10-CM | POA: Diagnosis present

## 2017-12-01 DIAGNOSIS — Z86010 Personal history of colonic polyps: Secondary | ICD-10-CM

## 2017-12-01 DIAGNOSIS — C3491 Malignant neoplasm of unspecified part of right bronchus or lung: Secondary | ICD-10-CM | POA: Diagnosis present

## 2017-12-01 DIAGNOSIS — K769 Liver disease, unspecified: Secondary | ICD-10-CM | POA: Diagnosis present

## 2017-12-01 DIAGNOSIS — Z923 Personal history of irradiation: Secondary | ICD-10-CM

## 2017-12-01 DIAGNOSIS — J189 Pneumonia, unspecified organism: Secondary | ICD-10-CM | POA: Diagnosis not present

## 2017-12-01 DIAGNOSIS — Y95 Nosocomial condition: Secondary | ICD-10-CM | POA: Diagnosis present

## 2017-12-01 DIAGNOSIS — I5032 Chronic diastolic (congestive) heart failure: Secondary | ICD-10-CM | POA: Diagnosis present

## 2017-12-01 DIAGNOSIS — J9602 Acute respiratory failure with hypercapnia: Secondary | ICD-10-CM

## 2017-12-01 DIAGNOSIS — J449 Chronic obstructive pulmonary disease, unspecified: Secondary | ICD-10-CM | POA: Diagnosis not present

## 2017-12-01 DIAGNOSIS — E872 Acidosis: Secondary | ICD-10-CM | POA: Diagnosis present

## 2017-12-01 DIAGNOSIS — E669 Obesity, unspecified: Secondary | ICD-10-CM | POA: Diagnosis present

## 2017-12-01 DIAGNOSIS — J90 Pleural effusion, not elsewhere classified: Secondary | ICD-10-CM | POA: Diagnosis not present

## 2017-12-01 DIAGNOSIS — Z6825 Body mass index (BMI) 25.0-25.9, adult: Secondary | ICD-10-CM

## 2017-12-01 DIAGNOSIS — Z7984 Long term (current) use of oral hypoglycemic drugs: Secondary | ICD-10-CM

## 2017-12-01 DIAGNOSIS — C3490 Malignant neoplasm of unspecified part of unspecified bronchus or lung: Secondary | ICD-10-CM | POA: Diagnosis not present

## 2017-12-01 DIAGNOSIS — Z7951 Long term (current) use of inhaled steroids: Secondary | ICD-10-CM

## 2017-12-01 DIAGNOSIS — Z86711 Personal history of pulmonary embolism: Secondary | ICD-10-CM | POA: Diagnosis not present

## 2017-12-01 DIAGNOSIS — Z95828 Presence of other vascular implants and grafts: Secondary | ICD-10-CM

## 2017-12-01 DIAGNOSIS — Z833 Family history of diabetes mellitus: Secondary | ICD-10-CM

## 2017-12-01 DIAGNOSIS — I2510 Atherosclerotic heart disease of native coronary artery without angina pectoris: Secondary | ICD-10-CM | POA: Diagnosis present

## 2017-12-01 DIAGNOSIS — Z8249 Family history of ischemic heart disease and other diseases of the circulatory system: Secondary | ICD-10-CM

## 2017-12-01 DIAGNOSIS — M797 Fibromyalgia: Secondary | ICD-10-CM | POA: Diagnosis present

## 2017-12-01 DIAGNOSIS — M5030 Other cervical disc degeneration, unspecified cervical region: Secondary | ICD-10-CM | POA: Diagnosis present

## 2017-12-01 DIAGNOSIS — Z9981 Dependence on supplemental oxygen: Secondary | ICD-10-CM | POA: Diagnosis not present

## 2017-12-01 DIAGNOSIS — Z66 Do not resuscitate: Secondary | ICD-10-CM | POA: Diagnosis present

## 2017-12-01 DIAGNOSIS — I110 Hypertensive heart disease with heart failure: Secondary | ICD-10-CM | POA: Diagnosis present

## 2017-12-01 DIAGNOSIS — Z8701 Personal history of pneumonia (recurrent): Secondary | ICD-10-CM | POA: Diagnosis not present

## 2017-12-01 DIAGNOSIS — Z7901 Long term (current) use of anticoagulants: Secondary | ICD-10-CM

## 2017-12-01 DIAGNOSIS — Z8042 Family history of malignant neoplasm of prostate: Secondary | ICD-10-CM

## 2017-12-01 DIAGNOSIS — Z79899 Other long term (current) drug therapy: Secondary | ICD-10-CM

## 2017-12-01 DIAGNOSIS — Z9089 Acquired absence of other organs: Secondary | ICD-10-CM

## 2017-12-01 DIAGNOSIS — M19011 Primary osteoarthritis, right shoulder: Secondary | ICD-10-CM | POA: Diagnosis present

## 2017-12-01 DIAGNOSIS — G894 Chronic pain syndrome: Secondary | ICD-10-CM

## 2017-12-01 DIAGNOSIS — Z7189 Other specified counseling: Secondary | ICD-10-CM | POA: Diagnosis not present

## 2017-12-01 DIAGNOSIS — Z515 Encounter for palliative care: Secondary | ICD-10-CM | POA: Diagnosis not present

## 2017-12-01 DIAGNOSIS — J9601 Acute respiratory failure with hypoxia: Secondary | ICD-10-CM | POA: Diagnosis present

## 2017-12-01 DIAGNOSIS — G893 Neoplasm related pain (acute) (chronic): Secondary | ICD-10-CM

## 2017-12-01 DIAGNOSIS — J9622 Acute and chronic respiratory failure with hypercapnia: Secondary | ICD-10-CM | POA: Diagnosis present

## 2017-12-01 LAB — CBC WITH DIFFERENTIAL/PLATELET
BASOS ABS: 0 10*3/uL (ref 0–0.1)
Basophils Relative: 0 %
EOS ABS: 0.1 10*3/uL (ref 0–0.7)
EOS PCT: 0 %
HCT: 43.2 % (ref 40.0–52.0)
Hemoglobin: 14 g/dL (ref 13.0–18.0)
Lymphocytes Relative: 6 %
Lymphs Abs: 0.8 10*3/uL — ABNORMAL LOW (ref 1.0–3.6)
MCH: 28.4 pg (ref 26.0–34.0)
MCHC: 32.5 g/dL (ref 32.0–36.0)
MCV: 87.2 fL (ref 80.0–100.0)
MONO ABS: 1 10*3/uL (ref 0.2–1.0)
Monocytes Relative: 8 %
Neutro Abs: 11.2 10*3/uL — ABNORMAL HIGH (ref 1.4–6.5)
Neutrophils Relative %: 86 %
PLATELETS: 175 10*3/uL (ref 150–440)
RBC: 4.95 MIL/uL (ref 4.40–5.90)
RDW: 16.3 % — AB (ref 11.5–14.5)
WBC: 13 10*3/uL — AB (ref 3.8–10.6)

## 2017-12-01 LAB — BRAIN NATRIURETIC PEPTIDE: B NATRIURETIC PEPTIDE 5: 81 pg/mL (ref 0.0–100.0)

## 2017-12-01 LAB — MRSA PCR SCREENING: MRSA by PCR: NEGATIVE

## 2017-12-01 LAB — COMPREHENSIVE METABOLIC PANEL
ALT: 18 U/L (ref 17–63)
AST: 21 U/L (ref 15–41)
Albumin: 3.2 g/dL — ABNORMAL LOW (ref 3.5–5.0)
Alkaline Phosphatase: 93 U/L (ref 38–126)
Anion gap: 13 (ref 5–15)
BILIRUBIN TOTAL: 1 mg/dL (ref 0.3–1.2)
BUN: 22 mg/dL — AB (ref 6–20)
CALCIUM: 10 mg/dL (ref 8.9–10.3)
CO2: 32 mmol/L (ref 22–32)
CREATININE: 1.45 mg/dL — AB (ref 0.61–1.24)
Chloride: 95 mmol/L — ABNORMAL LOW (ref 101–111)
GFR calc Af Amer: 53 mL/min — ABNORMAL LOW (ref >60)
GFR calc non Af Amer: 46 mL/min — ABNORMAL LOW (ref >60)
GLUCOSE: 197 mg/dL — AB (ref 65–99)
Potassium: 3.3 mmol/L — ABNORMAL LOW (ref 3.5–5.1)
Sodium: 140 mmol/L (ref 135–145)
TOTAL PROTEIN: 8.4 g/dL — AB (ref 6.5–8.1)

## 2017-12-01 LAB — TROPONIN I: Troponin I: 0.03 ng/mL (ref <0.03)

## 2017-12-01 LAB — LACTIC ACID, PLASMA
LACTIC ACID, VENOUS: 2.5 mmol/L — AB (ref 0.5–1.9)
Lactic Acid, Venous: 2.7 mmol/L (ref 0.5–1.9)

## 2017-12-01 LAB — GLUCOSE, CAPILLARY: Glucose-Capillary: 237 mg/dL — ABNORMAL HIGH (ref 65–99)

## 2017-12-01 LAB — PROCALCITONIN: Procalcitonin: 0.35 ng/mL

## 2017-12-01 MED ORDER — METHYLPREDNISOLONE SODIUM SUCC 125 MG IJ SOLR
125.0000 mg | Freq: Once | INTRAMUSCULAR | Status: AC
  Administered 2017-12-01: 125 mg via INTRAVENOUS
  Filled 2017-12-01: qty 2

## 2017-12-01 MED ORDER — IPRATROPIUM-ALBUTEROL 0.5-2.5 (3) MG/3ML IN SOLN
3.0000 mL | Freq: Once | RESPIRATORY_TRACT | Status: AC
  Administered 2017-12-01: 3 mL via RESPIRATORY_TRACT
  Filled 2017-12-01: qty 3

## 2017-12-01 MED ORDER — IPRATROPIUM-ALBUTEROL 0.5-2.5 (3) MG/3ML IN SOLN
3.0000 mL | Freq: Four times a day (QID) | RESPIRATORY_TRACT | Status: DC
  Administered 2017-12-01 – 2017-12-02 (×3): 3 mL via RESPIRATORY_TRACT
  Filled 2017-12-01 (×2): qty 3

## 2017-12-01 MED ORDER — VITAMIN D (ERGOCALCIFEROL) 1.25 MG (50000 UNIT) PO CAPS
50000.0000 [IU] | ORAL_CAPSULE | ORAL | Status: DC
  Administered 2017-12-03: 50000 [IU] via ORAL
  Filled 2017-12-01: qty 1

## 2017-12-01 MED ORDER — MOMETASONE FURO-FORMOTEROL FUM 100-5 MCG/ACT IN AERO
2.0000 | INHALATION_SPRAY | Freq: Two times a day (BID) | RESPIRATORY_TRACT | Status: DC
  Filled 2017-12-01: qty 8.8

## 2017-12-01 MED ORDER — PRIMIDONE 50 MG PO TABS
50.0000 mg | ORAL_TABLET | Freq: Every day | ORAL | Status: DC
  Administered 2017-12-02 – 2017-12-04 (×3): 50 mg via ORAL
  Filled 2017-12-01 (×3): qty 1

## 2017-12-01 MED ORDER — SODIUM CHLORIDE 0.9 % IV SOLN
2.0000 g | Freq: Once | INTRAVENOUS | Status: AC
  Administered 2017-12-01: 2 g via INTRAVENOUS
  Filled 2017-12-01: qty 2

## 2017-12-01 MED ORDER — SODIUM CHLORIDE 0.9 % IV SOLN
1500.0000 mg | INTRAVENOUS | Status: DC
  Administered 2017-12-02: 1500 mg via INTRAVENOUS
  Filled 2017-12-01 (×2): qty 1500

## 2017-12-01 MED ORDER — SODIUM CHLORIDE 0.9 % IV BOLUS
500.0000 mL | Freq: Once | INTRAVENOUS | Status: AC
  Administered 2017-12-01: 500 mL via INTRAVENOUS

## 2017-12-01 MED ORDER — POLYETHYLENE GLYCOL 3350 17 G PO PACK: 17.0000 g | PACK | Freq: Every day | ORAL | Status: DC | PRN

## 2017-12-01 MED ORDER — BUDESONIDE 0.25 MG/2ML IN SUSP
0.2500 mg | Freq: Two times a day (BID) | RESPIRATORY_TRACT | Status: DC
  Administered 2017-12-01 – 2017-12-02 (×2): 0.25 mg via RESPIRATORY_TRACT
  Filled 2017-12-01 (×2): qty 2

## 2017-12-01 MED ORDER — ACETAMINOPHEN 650 MG RE SUPP: 650.0000 mg | Freq: Four times a day (QID) | RECTAL | Status: DC | PRN

## 2017-12-01 MED ORDER — ACETAMINOPHEN 325 MG PO TABS: 650.0000 mg | ORAL_TABLET | Freq: Four times a day (QID) | ORAL | Status: DC | PRN

## 2017-12-01 MED ORDER — METHYLPREDNISOLONE SODIUM SUCC 125 MG IJ SOLR
60.0000 mg | Freq: Two times a day (BID) | INTRAMUSCULAR | Status: DC
  Administered 2017-12-02: 60 mg via INTRAVENOUS
  Filled 2017-12-01: qty 2

## 2017-12-01 MED ORDER — IPRATROPIUM-ALBUTEROL 0.5-2.5 (3) MG/3ML IN SOLN
3.0000 mL | Freq: Four times a day (QID) | RESPIRATORY_TRACT | Status: DC | PRN
  Filled 2017-12-01: qty 3

## 2017-12-01 MED ORDER — VANCOMYCIN HCL IN DEXTROSE 1-5 GM/200ML-% IV SOLN
1000.0000 mg | Freq: Once | INTRAVENOUS | Status: AC
  Administered 2017-12-01: 1000 mg via INTRAVENOUS
  Filled 2017-12-01: qty 200

## 2017-12-01 MED ORDER — OXYCODONE HCL 5 MG PO TABS: 10.0000 mg | ORAL_TABLET | Freq: Three times a day (TID) | ORAL | Status: DC | PRN

## 2017-12-01 MED ORDER — SODIUM CHLORIDE 0.9 % IV SOLN
1500.0000 mg | INTRAVENOUS | Status: DC
  Filled 2017-12-01: qty 1500

## 2017-12-01 MED ORDER — HYDRALAZINE HCL 20 MG/ML IJ SOLN: 10.0000 mg | Freq: Four times a day (QID) | INTRAMUSCULAR | Status: DC | PRN

## 2017-12-01 MED ORDER — APIXABAN 5 MG PO TABS
5.0000 mg | ORAL_TABLET | Freq: Two times a day (BID) | ORAL | Status: DC
  Administered 2017-12-01 – 2017-12-04 (×6): 5 mg via ORAL
  Filled 2017-12-01 (×6): qty 1

## 2017-12-01 MED ORDER — MAGNESIUM SULFATE 2 GM/50ML IV SOLN
2.0000 g | Freq: Once | INTRAVENOUS | Status: AC
  Administered 2017-12-01: 2 g via INTRAVENOUS
  Filled 2017-12-01: qty 50

## 2017-12-01 MED ORDER — POTASSIUM CHLORIDE CRYS ER 20 MEQ PO TBCR
20.0000 meq | EXTENDED_RELEASE_TABLET | ORAL | Status: AC
  Administered 2017-12-01 – 2017-12-02 (×2): 20 meq via ORAL
  Filled 2017-12-01 (×2): qty 1

## 2017-12-01 MED ORDER — ONDANSETRON HCL 4 MG PO TABS
4.0000 mg | ORAL_TABLET | Freq: Four times a day (QID) | ORAL | Status: DC | PRN
Start: 2017-12-01 — End: 2017-12-04

## 2017-12-01 MED ORDER — ACETAMINOPHEN 325 MG PO TABS
650.0000 mg | ORAL_TABLET | Freq: Four times a day (QID) | ORAL | Status: DC | PRN
  Administered 2017-12-01: 650 mg via ORAL
  Filled 2017-12-01: qty 2

## 2017-12-01 MED ORDER — SODIUM CHLORIDE 0.9 % IV SOLN
2.0000 g | Freq: Two times a day (BID) | INTRAVENOUS | Status: DC
  Administered 2017-12-02: 2 g via INTRAVENOUS
  Filled 2017-12-01 (×2): qty 2

## 2017-12-01 MED ORDER — SODIUM CHLORIDE 0.9 % IV SOLN
INTRAVENOUS | Status: DC
  Administered 2017-12-01: 21:00:00 via INTRAVENOUS

## 2017-12-01 MED ORDER — ONDANSETRON HCL 4 MG/2ML IJ SOLN: 4.0000 mg | Freq: Four times a day (QID) | INTRAMUSCULAR | Status: DC | PRN

## 2017-12-01 MED ORDER — INSULIN ASPART 100 UNIT/ML ~~LOC~~ SOLN
0.0000 [IU] | Freq: Three times a day (TID) | SUBCUTANEOUS | Status: DC
  Administered 2017-12-02: 3 [IU] via SUBCUTANEOUS
  Filled 2017-12-01: qty 1

## 2017-12-01 MED ORDER — AMIODARONE HCL 200 MG PO TABS
200.0000 mg | ORAL_TABLET | Freq: Every day | ORAL | Status: DC
  Administered 2017-12-02 – 2017-12-04 (×3): 200 mg via ORAL
  Filled 2017-12-01 (×3): qty 1

## 2017-12-01 MED ORDER — METOPROLOL SUCCINATE ER 50 MG PO TB24
50.0000 mg | ORAL_TABLET | Freq: Every day | ORAL | Status: DC
  Administered 2017-12-02 – 2017-12-04 (×3): 50 mg via ORAL
  Filled 2017-12-01 (×3): qty 1

## 2017-12-01 MED ORDER — SIMVASTATIN 20 MG PO TABS
10.0000 mg | ORAL_TABLET | Freq: Every evening | ORAL | Status: DC
  Administered 2017-12-01 – 2017-12-03 (×3): 10 mg via ORAL
  Filled 2017-12-01 (×3): qty 1

## 2017-12-01 NOTE — ED Provider Notes (Signed)
[facility] Emergency Department Provider Note    First MD Initiated Contact with Patient 12/01/17 1644     (approximate)  I have reviewed the triage vital signs and the nursing notes.   HISTORY  Chief Complaint Respiratory Distress    HPI Chris Wilkinson is a 77 y.o. male a history of lung cancer as well as CHF and COPD as well as CAD as well as a history of PE presents to the ER with chief complaint of worsening shortness of breath.  Patient found to be profoundly hypoxic on his home oxygen.  States that his shortness of breath was developing this morning but got progressively worse and did not have any improvement with home nebulizer treatments.  Has noted some lower extremity swelling.  Denies any chest pain but is also had more productive cough.  Denies any recent fevers and no recent antibiotics but was admitted to the hospital just 3 weeks ago.  EMS found the patient to be febrile to 100.3.  Past Medical History:  Diagnosis Date  . Cancer ([place])    Lung CA  . CHF (congestive heart failure) ([place])   . COPD (chronic obstructive pulmonary disease) ([place])   . Coronary artery disease   . Diabetes mellitus without complication ([place])   . Fibromyalgia   . GERD (gastroesophageal reflux disease)   . Hypertension   . Pulmonary emboli (H[facility])    Family History  Problem Relation Age of Onset  . Prostate cancer Father   . Prostate cancer Brother   . Diabetes Unknown        all 13 siblings  . Hypertension Unknown        all 13 siblings   Past Surgical History:  Procedure Laterality Date  . COLONOSCOPY WITH PROPOFOL N/A 01/18/2015   Procedure: COLONOSCOPY WITH PROPOFOL;  Surgeon: Manya Silvas, MD;  Location: [facility] ENDOSCOPY;  Service: Endoscopy;  Laterality: N/A;  . ESOPHAGOGASTRODUODENOSCOPY N/A 01/18/2015   Procedure: ESOPHAGOGASTRODUODENOSCOPY (EGD);  Surgeon: Manya Silvas, MD;  Location: [facility] ENDOSCOPY;  Service: Endoscopy;  Laterality: N/A;  . EYE  SURGERY    . FLEXIBLE BRONCHOSCOPY N/A 10/22/2016   Procedure: FLEXIBLE BRONCHOSCOPY;  Surgeon: Allyne Gee, MD;  Location: [facility] ORS;  Service: Pulmonary;  Laterality: N/A;  . HERNIA REPAIR    . IR FLUORO GUIDE PORT INSERTION LEFT  11/07/2016  . REPAIR KNEE LIGAMENT    . TONSILLECTOMY     Patient Active Problem List   Diagnosis Date Noted  . Pneumonia 11/13/2017  . SOB (shortness of breath) 11/04/2017  . Cough 11/04/2017  . COPD exacerbation ([place]) 10/10/2017  . Cervicalgia 09/30/2017  . Tendinitis of rotator cuff (Right) 09/30/2017  . DJD of AC (acromioclavicular) joint (Right) 09/30/2017  . 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 DDD (degenerative disc disease), cervical 09/30/2017  . Spondylosis without myelopathy or radiculopathy, cervical region 09/30/2017  . Atypical facial pain (Right) 09/30/2017  . Problems influencing health status 09/30/2017  . Abnormal MRI, shoulder (08/20/2011) (Right) 09/30/2017  . Cancer related pain 09/30/2017  . Pain after radiation therapy 09/30/2017  . Elevated sed rate 09/30/2017  . Elevated C-reactive protein (CRP) 09/30/2017  . Pneumothorax 09/07/2017  . Chronic throat pain (Primary Area of Pain) 08/12/2017  . Chronic secondary facial pain (Secondary Area of Pain) (Right) 08/12/2017  . Chronic neck pain (Tertiary Area of Pain) 08/12/2017  . Chronic pain syndrome 08/12/2017  . Other long term (current) drug therapy 08/12/2017  . Disorder of  skeletal system 08/12/2017  . Other specified health status 08/12/2017  . Long term current use of opiate analgesic 08/12/2017  . Encounter for antineoplastic immunotherapy 02/27/2017  . Carpal tunnel syndrome 12/04/2016  . Sprain of wrist 12/04/2016  . Pharmacologic therapy 11/20/2016  . Lung cancer ([place]) 11/04/2016  . HCAP (healthcare-associated pneumonia) 09/22/2016  . Sepsis ([place]) 08/04/2015  . CAP (community acquired pneumonia) 08/04/2015  . Acute bronchitis with COPD ([place]) 08/04/2015  .  Type 2 diabetes mellitus ([place]) 08/04/2015  . CAD (coronary artery disease) 08/04/2015  . HTN (hypertension) 08/04/2015  . GERD (gastroesophageal reflux disease) 08/04/2015  . Bloating 12/04/2014  . Gas 12/04/2014  . Hx of adenomatous polyp of colon 12/04/2014      Prior to Admission medications   Medication Sig Start Date End Date Taking? Authorizing Provider  acetaminophen (TYLENOL) 325 MG tablet Take 2 tablets (650 mg total) by mouth every 6 (six) hours as needed for mild pain (or Fever >/= 101). 09/26/16  Yes Gouru, Illene Silver, MD  amiodarone (PACERONE) 200 MG tablet Take 1 tablet by mouth daily. 11/10/17  Yes [provider]  apixaban (ELIQUIS) 5 MG TABS tablet Take 1 tablet (5 mg total) by mouth 2 (two) times daily. 04/10/17  Yes Verlon Au, NP  ipratropium-albuterol (DUONEB) 0.5-2.5 (3) MG/3ML SOLN Take 3 mLs by nebulization every 6 (six) hours as needed. 10/09/17  Yes Boscia, Greer Ee, NP  metoprolol succinate (TOPROL-XL) 50 MG 24 hr tablet Take 50 mg by mouth daily. Take with or immediately following a meal.   Yes [provider]  mometasone-formoterol (DULERA) 100-5 MCG/ACT AERO Inhale 2 puffs into the lungs 2 times daily at 12 noon and 4 pm. 09/17/17  Yes Allyne Gee, MD  oxyCODONE 10 MG TABS Take 1 tablet (10 mg total) by mouth every 8 (eight) hours as needed for severe pain. 11/16/17 12/16/17 Yes Gouru, Aruna, MD  polyethylene glycol (MIRALAX / GLYCOLAX) packet Take 17 g by mouth daily as needed for mild constipation. 11/16/17  Yes Gouru, Illene Silver, MD  primidone (MYSOLINE) 50 MG tablet Take 1 tablet by mouth daily. 08/18/17  Yes [provider]  simvastatin (ZOCOR) 10 MG tablet Take 10 mg by mouth every evening.    Yes [provider]  TRADJENTA 5 MG TABS tablet Take 5 mg by mouth daily. 08/25/17  Yes [provider]  Vitamin D, Ergocalciferol, (DRISDOL) 50000 units CAPS capsule Take 1 capsule by mouth every Thursday. 08/25/17  Yes [provider]  NONFORMULARY OR COMPOUNDED ITEM Take 30 mLs by mouth every 6 (six) hours as needed for up to 2 doses. Mix Maalox + 2% Viscous Lidocaine + Donnatal in equal amounts. Take 30 ml PO q6hrs. Do not exceed 3 doses per day. Patient not taking: Reported on 12/01/2017 09/30/17   Milinda Pointer, MD  predniSONE (STERAPRED UNI-PAK 21 TAB) 10 MG (21) TBPK tablet Take 1 tablet (10 mg total) by mouth daily. Take 6 tablets by mouth for 1 day followed by  5 tablets by mouth for 1 day followed by  4 tablets by mouth for 1 day followed by  3 tablets by mouth for 1 day followed by  2 tablets by mouth for 1 day followed by  1 tablet by mouth for a day and stop Patient not taking: Reported on 12/01/2017 11/16/17   [name], MD  prochlorperazine (COMPAZINE) 10 MG tablet Take 1 tablet (10 mg total) by mouth every 6 (six) hours as needed (Nausea or  vomiting). Patient not taking: Reported on 12/04/2016 11/04/16 02/03/17  Sindy Guadeloupe, MD    Allergies Patient has no known allergies.    Social History Social History   Tobacco Use  . Smoking status: Former Smoker    Last attempt to quit: 12/01/2003    Years since quitting: 14.0  . Smokeless tobacco: Never Used  Substance Use Topics  . Alcohol use: Not Currently    Alcohol/week: 1.2 oz    Types: 2 Shots of liquor per week    Frequency: Never    Comment: on the weeknd 2 shots  . Drug use: No    Review of Systems Patient denies headaches, rhinorrhea, blurry vision, numbness, shortness of breath, chest pain, edema, cough, abdominal pain, nausea, vomiting, diarrhea, dysuria, fevers, rashes or hallucinations unless otherwise stated above in HPI. ____________________________________________   PHYSICAL EXAM:  VITAL SIGNS: Vitals:   12/01/17 1800 12/01/17 1830  BP:  120/74  Pulse: (!) 137 (!) 126  Resp: (!) 44 (!) 31  Temp:    SpO2: 98% 100%    Constitutional: Alert and oriented.ill appearing in moderate respiratory distressEyes:  Conjunctivae are normal.  Head: Atraumatic. Nose: No congestion/rhinnorhea. Mouth/Throat: Mucous membranes are moist.   Neck: No stridor. Painless ROM.  Cardiovascular: tachycardic rate, regular rhythm. Grossly normal heart sounds.  Good peripheral circulation. Respiratory: tachypnea with use of accessory muscless, diminished BS on left with rhonchi and wheeze in right lung fields.  No retractions. Lungs CTAB. Gastrointestinal: Soft and nontender. No distention. No abdominal bruits. No CVA tenderness. Genitourinary:  Musculoskeletal: No lower extremity tenderness, trace BLE edema.  No joint effusions. Neurologic:  Normal speech and language. No gross focal neurologic deficits are appreciated. No facial droop Skin:  Skin is warm, dry and intact. No rash noted. Psychiatric: Mood and affect are normal. Speech and behavior are normal.  ____________________________________________   LABS (all labs ordered are listed, but only abnormal results are displayed)  Results for orders placed or performed during the hospital encounter of 12/01/17 (from the past 24 hour(s))  Lactic acid, plasma     Status: Abnormal   Collection Time: 12/01/17  4:49 PM  Result Value Ref Range   Lactic Acid, Venous 2.5 (HH) 0.5 - 1.9 mmol/L  CBC WITH DIFFERENTIAL     Status: Abnormal   Collection Time: 12/01/17  4:49 PM  Result Value Ref Range   WBC 13.0 (H) 3.8 - 10.6 K/uL   RBC 4.95 4.40 - 5.90 MIL/uL   Hemoglobin 14.0 13.0 - 18.0 g/dL   HCT 43.2 40.0 - 52.0 %   MCV 87.2 80.0 - 100.0 fL   MCH 28.4 26.0 - 34.0 pg   MCHC 32.5 32.0 - 36.0 g/dL   RDW 16.3 (H) 11.5 - 14.5 %   Platelets 175 150 - 440 K/uL   Neutrophils Relative % 86 %   Neutro Abs 11.2 (H) 1.4 - 6.5 K/uL   Lymphocytes Relative 6 %   Lymphs Abs 0.8 (L) 1.0 - 3.6 K/uL   Monocytes Relative 8 %   Monocytes Absolute 1.0 0.2 - 1.0 K/uL   Eosinophils Relative 0 %   Eosinophils Absolute 0.1 0 - 0.7 K/uL   Basophils Relative 0 %   Basophils Absolute  0.0 0 - 0.1 K/uL  Blood gas, venous ([facility], [facility], [facility])     Status: Abnormal (Preliminary result)   Collection Time: 12/01/17  4:49 PM  Result Value Ref Range   FIO2 0.40    Delivery systems BILEVEL POSITIVE  AIRWAY PRESSURE    pH, Ven 7.33 7.250 - 7.430   pCO2, Ven 79 (HH) 44.0 - 60.0 mmHg   pO2, Ven PENDING 32.0 - 45.0 mmHg   Bicarbonate 41.7 (H) 20.0 - 28.0 mmol/L   Acid-Base Excess 11.9 (H) 0.0 - 2.0 mmol/L   O2 Saturation PENDING %   Patient temperature 37.0    Collection site VENOUS    Sample type VENOUS   Brain natriuretic peptide     Status: None   Collection Time: 12/01/17  4:50 PM  Result Value Ref Range   B Natriuretic Peptide 81.0 0.0 - 100.0 pg/mL   ____________________________________________  EKG My review and personal interpretation at Time: 16:41   Indication: tahycardia  Rate: 130  Rhythm: sinus Axis: normal Other: normal intervals, non specific st abn likely rate dependent ____________________________________________  RADIOLOGY  I personally reviewed all radiographic images ordered to evaluate for the above acute complaints and reviewed radiology reports and findings.  These findings were personally discussed with the patient.  Please see medical record for radiology report.  ____________________________________________   PROCEDURES  Procedure(s) performed:  .Critical Care Performed by: Merlyn Lot, MD Authorized by: Merlyn Lot, MD   Critical care provider statement:    Critical care time (minutes):  40   Critical care time was exclusive of:  Separately billable procedures and treating other patients   Critical care was necessary to treat or prevent imminent or life-threatening deterioration of the following conditions:  Respiratory failure   Critical care was time spent personally by me on the following activities:  Development of treatment plan with patient or surrogate, discussions with consultants, evaluation of patient's response to  treatment, examination of patient, obtaining history from patient or surrogate, ordering and performing treatments and interventions, ordering and review of laboratory studies, ordering and review of radiographic studies, pulse oximetry, re-evaluation of patient's condition and review of old charts      Critical Care performed: yes ____________________________________________   INITIAL IMPRESSION / [place] / ED COURSE  Pertinent labs & imaging results that were available during my care of the patient were reviewed by me and considered in my medical decision making (see chart for details).  DDX: Asthma, copd, CHF, pna, ptx, malignancy, Pe, anemia   Chris Wilkinson is a 77 y.o. who presents to the ED with story distress described above.  Patient's presentation is complex as he does have underlying CHF, COPD, heart disease, lung cancer and recent admissions to the hospital.  Patient is a absent lung sounds with rhonchi fever at home certainly concerning for healthcare associated pneumonia.  Started on broad-spectrum antibiotics.  Placed on BiPAP for respiratory support given his acute respiratory failure with hypoxia.  Will provide IV fluids, nebulizer treatments and further evaluation.  The patient will be placed on continuous pulse oximetry and telemetry for monitoring.  Laboratory evaluation will be sent to evaluate for the above complaints.     Clinical Course as of Dec 01 1836  Tue Dec 01, 2017  1758 Patient with persistent tachycardia but his work of breathing is significantly improving.  Seems most clinically consistent with COPD exacerbation probably with component of infectious process given his leukocytosis, fever in route and lactic acidosis.  Not clinically consistent with PE as he is already on Eliquis.  We will continue with IV fluids IV magnesium and nebulizer treatments.   [PR]    Clinical Course User Index [PR] Merlyn Lot, MD     As part of my  medical  decision making, I reviewed the following data within the [place] notes reviewed and incorporated, Labs reviewed, notes from prior ED visits and [place] Controlled Substance Database   ____________________________________________   FINAL CLINICAL IMPRESSION(S) / ED DIAGNOSES  Final diagnoses:  Acute respiratory failure with hypoxia ([place])  COPD with acute exacerbation ([place])      NEW MEDICATIONS STARTED DURING THIS VISIT:  New Prescriptions   No medications on file     Note:  This document was prepared using Dragon voice recognition software and may include unintentional dictation errors.    Merlyn Lot, MD 12/01/17 Bosie Helper

## 2017-12-01 NOTE — ED Notes (Signed)
RT notified to place patient on BiPap.

## 2017-12-01 NOTE — ED Notes (Signed)
Lab is here to recollect blood work.

## 2017-12-01 NOTE — Consult Note (Signed)
Name: Chris Wilkinson MRN: [id number] DOB: [birth year]    ADMISSION DATE:  12/01/2017 CONSULTATION DATE: 12/01/2017  REFERRING MD : Dr. Benjie Karvonen   CHIEF COMPLAINT: Shortness of Breath   BRIEF PATIENT DESCRIPTION:  77 yo male admitted with acute on chronic hypoxic hypercapnic respiratory failure secondary to AECOPD requiring Bipap   SIGNIFICANT EVENTS/STUDIES:  05/21 Pt admitted to stepdown unit   HISTORY OF PRESENT ILLNESS:   This is a 77 yo male with a PMH of Pulmonary Emboli, HTN, GERD, Fibromyalgia, Diabetes Mellitus, CAD, COPD, CHF, Chronic O2, and Stage III  Lung Cancer s/p chemoradiation completed 2018 .  He presented to [facility] ER via EMS on 05/21 with worsening shortness of breath and worsening hypoxia onset of symptoms the morning of 05/21.  Per ER notes the pt took his home nebulizer treatments without improvement of symptoms.  He recently completed a course of abx to treat HCAP. He also endorsed bilateral lower extremity edema and productive cough.  Upon arrival to the ER he was placed on Bipap and given iv steroids.  CXR revealed small bilateral pleural effusions, mono screen negative, and venous gas resulted pH 7.33 and CO2 79. He was subsequently admitted to the stepdown unit by hospitalist team for further workup and treatment.  PAST MEDICAL HISTORY :   has a past medical history of Cancer ([place]), CHF (congestive heart failure) ([place]), COPD (chronic obstructive pulmonary disease) ([place]), Coronary artery disease, Diabetes mellitus without complication ([place]), Fibromyalgia, GERD (gastroesophageal reflux disease), Hypertension, and Pulmonary emboli ([place]).  has a past surgical history that includes Tonsillectomy; Hernia repair; Eye surgery; Repair knee ligament; Colonoscopy with propofol (N/A, 01/18/2015); Esophagogastroduodenoscopy (N/A, 01/18/2015); Flexible bronchoscopy (N/A, 10/22/2016); and IR FLUORO GUIDE PORT INSERTION LEFT (11/07/2016). Prior to Admission medications   Medication Sig Start  Date End Date Taking? Authorizing Provider  acetaminophen (TYLENOL) 325 MG tablet Take 2 tablets (650 mg total) by mouth every 6 (six) hours as needed for mild pain (or Fever >/= 101). 09/26/16  Yes Gouru, Illene Silver, MD  amiodarone (PACERONE) 200 MG tablet Take 1 tablet by mouth daily. 11/10/17  Yes [provider]  apixaban (ELIQUIS) 5 MG TABS tablet Take 1 tablet (5 mg total) by mouth 2 (two) times daily. 04/10/17  Yes Verlon Au, NP  ipratropium-albuterol (DUONEB) 0.5-2.5 (3) MG/3ML SOLN Take 3 mLs by nebulization every 6 (six) hours as needed. 10/09/17  Yes Boscia, Greer Ee, NP  metoprolol succinate (TOPROL-XL) 50 MG 24 hr tablet Take 50 mg by mouth daily. Take with or immediately following a meal.   Yes [provider]  mometasone-formoterol (DULERA) 100-5 MCG/ACT AERO Inhale 2 puffs into the lungs 2 times daily at 12 noon and 4 pm. 09/17/17  Yes Allyne Gee, MD  oxyCODONE 10 MG TABS Take 1 tablet (10 mg total) by mouth every 8 (eight) hours as needed for severe pain. 11/16/17 12/16/17 Yes Gouru, Aruna, MD  polyethylene glycol (MIRALAX / GLYCOLAX) packet Take 17 g by mouth daily as needed for mild constipation. 11/16/17  Yes Gouru, Illene Silver, MD  primidone (MYSOLINE) 50 MG tablet Take 1 tablet by mouth daily. 08/18/17  Yes [provider]  simvastatin (ZOCOR) 10 MG tablet Take 10 mg by mouth every evening.    Yes [provider]  TRADJENTA 5 MG TABS tablet Take 5 mg by mouth daily. 08/25/17  Yes [provider]  Vitamin D, Ergocalciferol, (DRISDOL) 50000 units CAPS capsule Take 1 capsule by mouth every Thursday. 08/25/17  Yes [provider]  NONFORMULARY OR COMPOUNDED ITEM Take 30 mLs by mouth every 6 (six) hours as needed for up to 2 doses. Mix Maalox + 2% Viscous Lidocaine + Donnatal in equal amounts. Take 30 ml PO q6hrs. Do not exceed 3 doses per day. Patient not taking: Reported on 12/01/2017 09/30/17   Milinda Pointer, MD  predniSONE (STERAPRED  UNI-PAK 21 TAB) 10 MG (21) TBPK tablet Take 1 tablet (10 mg total) by mouth daily. Take 6 tablets by mouth for 1 day followed by  5 tablets by mouth for 1 day followed by  4 tablets by mouth for 1 day followed by  3 tablets by mouth for 1 day followed by  2 tablets by mouth for 1 day followed by  1 tablet by mouth for a day and stop Patient not taking: Reported on 12/01/2017 11/16/17   [name], MD  prochlorperazine (COMPAZINE) 10 MG tablet Take 1 tablet (10 mg total) by mouth every 6 (six) hours as needed (Nausea or vomiting). Patient not taking: Reported on 12/04/2016 11/04/16 02/03/17  Sindy Guadeloupe, MD   No Known Allergies  FAMILY HISTORY:  family history includes Diabetes in his unknown relative; Hypertension in his unknown relative; Prostate cancer in his brother and father. SOCIAL HISTORY:  reports that he quit smoking about 14 years ago. He has never used smokeless tobacco. He reports that he drank about 1.2 oz of alcohol per week. He reports that he does not use drugs.  REVIEW OF SYSTEMS: Positives in BOLD  Constitutional: Negative for fever, chills, weight loss, malaise/fatigue and diaphoresis.  HENT: Negative for hearing loss, ear pain, nosebleeds, congestion, sore throat, neck pain, tinnitus and ear discharge.   Eyes: Negative for blurred vision, double vision, photophobia, pain, discharge and redness.  Respiratory: intermittent cough, hemoptysis, sputum production, shortness of breath, wheezing and stridor.   Cardiovascular: Negative for chest pain, palpitations, orthopnea, claudication, leg swelling and PND.  Gastrointestinal: Negative for heartburn, nausea, vomiting, abdominal pain, diarrhea, constipation, blood in stool and melena.  Genitourinary: Negative for dysuria, urgency, frequency, hematuria and flank pain.  Musculoskeletal: Negative for myalgias, back pain, joint pain and falls.  Skin: Negative for itching and rash.  Neurological: Negative for dizziness, tingling,  tremors, sensory change, speech change, focal weakness, seizures, loss of consciousness, weakness and headaches.  Endo/Heme/Allergies: Negative for environmental allergies and polydipsia. Does not bruise/bleed easily.  SUBJECTIVE:  No complaints at this time  VITAL SIGNS: Temp:  [98.9 F (37.2 C)] 98.9 F (37.2 C) (05/21 1648) Pulse Rate:  [126-139] 126 (05/21 1830) Resp:  [24-44] 31 (05/21 1830) BP: (120-173)/(74-100) 120/74 (05/21 1830) SpO2:  [86 %-100 %] 100 % (05/21 1830) Weight:  [86.2 kg (190 lb)] 86.2 kg (190 lb) (05/21 1649)  PHYSICAL EXAMINATION: General: chronically ill appearing male, NAD resting in bed Neuro: alert and oriented, follows commands  HEENT: mild JVD present, supple Cardiovascular: sinus tach, no R/G Lungs: diffuse wheezes throughout, slight labored and tachypneic  Abdomen: +BS x4, obese, soft, non tender, non distended  Musculoskeletal: 1+ bilateral lower extremity edema  Skin: intact no rashes or lesions   No results for input(s): NA, K, CL, CO2, BUN, CREATININE, GLUCOSE in the last 168 hours. Recent Labs  Lab 12/01/17 1649  HGB 14.0  HCT 43.2  WBC 13.0*  PLT 175   Dg Chest Port 1 View  Result Date: 12/01/2017 CLINICAL DATA:  77 year old male with a history of respiratory distress EXAM: PORTABLE CHEST 1 VIEW COMPARISON:  11/13/2017 FINDINGS: Cardiomediastinal  silhouette is unchanged in size and contour. Opacity at the right lung base obscuring the right hemidiaphragm and the right heart border is unchanged from the comparison plain film and CT. Nodule in the right suprahilar region is unchanged. Changes of emphysema bilaterally, worst on the left where there is significant bullous disease replacing majority of the left lung parenchyma. Blunting of the left costophrenic angle. Unchanged left IJ port catheter. IMPRESSION: Similar appearance of the chest x-ray to 11/13/2017, with redemonstration of small right pleural effusion, likely trace left pleural  effusion. Extensive emphysema, including left-sided bullous disease. Unchanged appearance of right suprahilar lung nodule. Unchanged port catheter. Electronically Signed   By: Corrie Mckusick D.O.   On: 12/01/2017 17:27    ASSESSMENT / PLAN: Acute on chronic hypoxic hypercapnic respiratory failure secondary to AECOPD Lactic Acidosis  Hx: CHF, Stage III Lung Cancer, Diabetes Mellitus, Pulmonary Emboli, and GERD P: Prn Bipap for dyspnea and/or hypoxia Scheduled and prn bronchodilator therapy IV and nebulized steroids Continuous telemetry monitoring  Trend BMP Replace electrolytes as indicated Monitor UOP Trend WBC and monitor fever curve  Trend PCT and lactic acid  Continue current abx for now if MRSA PCR negative will discontinue vancomycin Continue outpatient eliquis  Trend CBC  Monitor for s/sx of bleeding and transfuse for hgb <7 SSI   Marda Stalker, [place] Pager [phone] (please enter 7 digits) PCCM Consult Pager [phone] (please enter 7 digits)

## 2017-12-01 NOTE — Progress Notes (Signed)
Adjusted pressures for patient comfort, tidal volumes still 437ml and saturations adequate.

## 2017-12-01 NOTE — ED Triage Notes (Signed)
Pt in via [facility] from home with complaints of respiratory distress since waking this morning, not improving throughout the day.  Pt is a lung cancer patient, on 3L nasal cannula at baseline.  Pt saturation 86% on 3L upon arrival.  Pt with congested cough.  EDP to bedside.

## 2017-12-01 NOTE — ED Notes (Signed)
Radiology to bedside.

## 2017-12-01 NOTE — Progress Notes (Signed)
Pharmacy Antibiotic Note  Chris Wilkinson is a 77 y.o. male admitted on 12/01/2017 with pneumonia.  Pharmacy has been consulted for cefepime and vancomycin dosing.  Plan: Vancomycin 1500mg  IV every 24 hours.  Goal trough 15-20 mcg/mL. Cefepime 2gm iv q12h    Height: 5\' 11"  (180.3 cm) Weight: 190 lb (86.2 kg) IBW/kg (Calculated) : 75.3  Temp (24hrs), Avg:98.9 F (37.2 C), Min:98.9 F (37.2 C), Max:98.9 F (37.2 C)  Recent Labs  Lab 12/01/17 1649  WBC 13.0*  LATICACIDVEN 2.5*    Estimated Creatinine Clearance: 46.2 mL/min (A) (by C-G formula based on SCr of 1.47 mg/dL (H)).    No Known Allergies  Antimicrobials this admission: Anti-infectives (From admission, onward)   Start     Dose/Rate Route Frequency Ordered Stop   12/02/17 0500  ceFEPIme (MAXIPIME) 2 g in sodium chloride 0.9 % 100 mL IVPB     2 g 200 mL/hr over 30 Minutes Intravenous Every 12 hours 12/01/17 1824     12/01/17 2300  vancomycin (VANCOCIN) 1,500 mg in sodium chloride 0.9 % 500 mL IVPB     1,500 mg 250 mL/hr over 120 Minutes Intravenous Every 24 hours 12/01/17 1827     12/01/17 1730  ceFEPIme (MAXIPIME) 2 g in sodium chloride 0.9 % 100 mL IVPB     2 g 200 mL/hr over 30 Minutes Intravenous  Once 12/01/17 1725     12/01/17 1730  vancomycin (VANCOCIN) IVPB 1000 mg/200 mL premix     1,000 mg 200 mL/hr over 60 Minutes Intravenous  Once 12/01/17 1725         Microbiology results: No results found for this or any previous visit (from the past 240 hour(s)).   Thank you for allowing pharmacy to be a part of this patient's care.  Donna Christen Dhanush Jokerst 12/01/2017 6:27 PM

## 2017-12-01 NOTE — Progress Notes (Signed)
Spoke with Hinton Dyer PA regarding patients potassium level, lactic acid and procalcitonin level. Orders will be placed.

## 2017-12-01 NOTE — ED Notes (Signed)
Hospitalist to bedside at this time 

## 2017-12-01 NOTE — H&P (Signed)
[place] [facility] [place] NAME: Chris Wilkinson    MR#:  [id number]  DATE OF BIRTH:  1941-06-23  DATE OF ADMISSION:  12/01/2017  PRIMARY CARE PHYSICIAN: Danelle Berry, NP   REQUESTING/REFERRING PHYSICIAN: dr Quentin Cornwall  CHIEF COMPLAINT:   SOB HISTORY OF PRESENT ILLNESS:  Chris Wilkinson  is a 77 y.o. 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 Patient reports over the past several days he has had increasing shortness of breath, dyspnea exertion, wheezing and cough with sputum production.  He denies fever however had a low-grade temperature upon arrival to the ER today.  Patient was recently discharged from the hospital due to COPD exacerbation along with healthcare acquired pneumonia.  Upon arrival to the emergency room patient was placed on BiPAP and given IV steroids.  He was treated with cefepime and vancomycin empirically.  Chest x-ray does not show pneumonia.  Lactic acid level is elevated.  BNP is within normal limits.  Procalcitonin level is pending Blood cultures ordered in the ER as well.  PAST MEDICAL HISTORY:   Past Medical History:  Diagnosis Date  . Cancer ([place])    Lung CA  . CHF (congestive heart failure) ([place])   . COPD (chronic obstructive pulmonary disease) ([place])   . Coronary artery disease   . Diabetes mellitus without complication ([place])   . Fibromyalgia   . GERD (gastroesophageal reflux disease)   . Hypertension   . Pulmonary emboli ([place])     PAST SURGICAL HISTORY:   Past Surgical History:  Procedure Laterality Date  . COLONOSCOPY WITH PROPOFOL N/A 01/18/2015   Procedure: COLONOSCOPY WITH PROPOFOL;  Surgeon: Manya Silvas, MD;  Location: [facility] ENDOSCOPY;  Service: Endoscopy;  Laterality: N/A;  . ESOPHAGOGASTRODUODENOSCOPY N/A 01/18/2015   Procedure:  ESOPHAGOGASTRODUODENOSCOPY (EGD);  Surgeon: Manya Silvas, MD;  Location: [facility] ENDOSCOPY;  Service: Endoscopy;  Laterality: N/A;  . EYE SURGERY    . FLEXIBLE BRONCHOSCOPY N/A 10/22/2016   Procedure: FLEXIBLE BRONCHOSCOPY;  Surgeon: Allyne Gee, MD;  Location: [facility] ORS;  Service: Pulmonary;  Laterality: N/A;  . HERNIA REPAIR    . IR FLUORO GUIDE PORT INSERTION LEFT  11/07/2016  . REPAIR KNEE LIGAMENT    . TONSILLECTOMY      SOCIAL HISTORY:   Social History   Tobacco Use  . Smoking status: Former Smoker    Last attempt to quit: 12/01/2003    Years since quitting: 14.0  . Smokeless tobacco: Never Used  Substance Use Topics  . Alcohol use: Not Currently    Alcohol/week: 1.2 oz    Types: 2 Shots of liquor per week    Frequency: Never    Comment: on the weeknd 2 shots    FAMILY HISTORY:   Family History  Problem Relation Age of Onset  . Prostate cancer Father   . Prostate cancer Brother   . Diabetes Unknown        all 13 siblings  . Hypertension Unknown        all 13 siblings    DRUG ALLERGIES:  No Known Allergies  REVIEW OF SYSTEMS:   Review of Systems  Constitutional: Positive for malaise/fatigue. Negative for chills and fever.  HENT: Negative.  Negative for ear discharge, ear pain, hearing loss, nosebleeds and sore  throat.   Eyes: Negative.  Negative for blurred vision and pain.  Respiratory: Positive for cough, sputum production, shortness of breath and wheezing. Negative for hemoptysis.   Cardiovascular: Negative.  Negative for chest pain, palpitations and leg swelling.  Gastrointestinal: Negative.  Negative for abdominal pain, blood in stool, diarrhea, nausea and vomiting.  Genitourinary: Negative.  Negative for dysuria.  Musculoskeletal: Negative.  Negative for back pain.  Skin: Negative.   Neurological: Negative for dizziness, tremors, speech change, focal weakness, seizures and headaches.  Endo/Heme/Allergies: Negative.  Does not bruise/bleed easily.   Psychiatric/Behavioral: Negative.  Negative for depression, hallucinations and suicidal ideas.    MEDICATIONS AT HOME:   Prior to Admission medications   Medication Sig Start Date End Date Taking? Authorizing Provider  acetaminophen (TYLENOL) 325 MG tablet Take 2 tablets (650 mg total) by mouth every 6 (six) hours as needed for mild pain (or Fever >/= 101). 09/26/16  Yes Gouru, Illene Silver, MD  amiodarone (PACERONE) 200 MG tablet Take 1 tablet by mouth daily. 11/10/17  Yes [provider]  apixaban (ELIQUIS) 5 MG TABS tablet Take 1 tablet (5 mg total) by mouth 2 (two) times daily. 04/10/17  Yes Verlon Au, NP  ipratropium-albuterol (DUONEB) 0.5-2.5 (3) MG/3ML SOLN Take 3 mLs by nebulization every 6 (six) hours as needed. 10/09/17  Yes Boscia, Greer Ee, NP  metoprolol succinate (TOPROL-XL) 50 MG 24 hr tablet Take 50 mg by mouth daily. Take with or immediately following a meal.   Yes [provider]  mometasone-formoterol (DULERA) 100-5 MCG/ACT AERO Inhale 2 puffs into the lungs 2 times daily at 12 noon and 4 pm. 09/17/17  Yes Allyne Gee, MD  oxyCODONE 10 MG TABS Take 1 tablet (10 mg total) by mouth every 8 (eight) hours as needed for severe pain. 11/16/17 12/16/17 Yes Gouru, Aruna, MD  polyethylene glycol (MIRALAX / GLYCOLAX) packet Take 17 g by mouth daily as needed for mild constipation. 11/16/17  Yes Gouru, Illene Silver, MD  primidone (MYSOLINE) 50 MG tablet Take 1 tablet by mouth daily. 08/18/17  Yes [provider]  simvastatin (ZOCOR) 10 MG tablet Take 10 mg by mouth every evening.    Yes [provider]  TRADJENTA 5 MG TABS tablet Take 5 mg by mouth daily. 08/25/17  Yes [provider]  Vitamin D, Ergocalciferol, (DRISDOL) 50000 units CAPS capsule Take 1 capsule by mouth every Thursday. 08/25/17  Yes [provider]  NONFORMULARY OR COMPOUNDED ITEM Take 30 mLs by mouth every 6 (six) hours as needed for up to 2 doses. Mix Maalox + 2% Viscous Lidocaine +  Donnatal in equal amounts. Take 30 ml PO q6hrs. Do not exceed 3 doses per day. Patient not taking: Reported on 12/01/2017 09/30/17   Milinda Pointer, MD  predniSONE (STERAPRED UNI-PAK 21 TAB) 10 MG (21) TBPK tablet Take 1 tablet (10 mg total) by mouth daily. Take 6 tablets by mouth for 1 day followed by  5 tablets by mouth for 1 day followed by  4 tablets by mouth for 1 day followed by  3 tablets by mouth for 1 day followed by  2 tablets by mouth for 1 day followed by  1 tablet by mouth for a day and stop Patient not taking: Reported on 12/01/2017 11/16/17   [name], MD  prochlorperazine (COMPAZINE) 10 MG tablet Take 1 tablet (10 mg total) by mouth every 6 (six) hours as needed (Nausea or vomiting). Patient not taking: Reported on 12/04/2016 11/04/16 02/03/17  Janese Banks,  Weston Anna, MD      VITAL SIGNS:  Blood pressure (!) 173/95, pulse (!) 135, temperature 98.9 F (37.2 C), temperature source Oral, resp. rate (!) 24, height _0  (1.803 m), weight 86.2 kg (190 lb), SpO2 (!) 86 %.  PHYSICAL EXAMINATION:   Physical Exam  Constitutional: He is oriented to person, place, and time. He appears well-developed. He appears distressed.  HENT:  Head: Normocephalic.  Eyes: Pupils are equal, round, and reactive to light. EOM are normal. No scleral icterus.  Neck: Normal range of motion. Neck supple. No JVD present. No tracheal deviation present. No thyromegaly present.  Cardiovascular: Normal rate, regular rhythm and normal heart sounds. Exam reveals no gallop and no friction rub.  No murmur heard. Pulmonary/Chest: Breath sounds normal. No respiratory distress. He has no wheezes. He has no rales. He exhibits no tenderness.  decreased left side Inc resp effort Sitting upright with bipap on   Abdominal: Soft. Bowel sounds are normal. He exhibits no distension and no mass. There is no tenderness. There is no rebound and no guarding.  Musculoskeletal: Normal range of motion. He exhibits no edema.   Neurological: He is alert and oriented to person, place, and time.  Skin: Skin is warm. No rash noted. No erythema.  Psychiatric: Judgment normal.      LABORATORY PANEL:   CBC Recent Labs  Lab 12/01/17 1649  WBC 13.0*  HGB 14.0  HCT 43.2  PLT 175   ------------------------------------------------------------------------------------------------------------------  Chemistries  No results for input(s): NA, K, CL, CO2, GLUCOSE, BUN, CREATININE, CALCIUM, MG, AST, ALT, ALKPHOS, BILITOT in the last 168 hours.  Invalid input(s): GFRCGP ------------------------------------------------------------------------------------------------------------------  Cardiac Enzymes No results for input(s): TROPONINI in the last 168 hours. ------------------------------------------------------------------------------------------------------------------  RADIOLOGY:  Dg Chest Port 1 View  Result Date: 12/01/2017 CLINICAL DATA:  77 year old male with a history of respiratory distress EXAM: PORTABLE CHEST 1 VIEW COMPARISON:  11/13/2017 FINDINGS: Cardiomediastinal silhouette is unchanged in size and contour. Opacity at the right lung base obscuring the right hemidiaphragm and the right heart border is unchanged from the comparison plain film and CT. Nodule in the right suprahilar region is unchanged. Changes of emphysema bilaterally, worst on the left where there is significant bullous disease replacing majority of the left lung parenchyma. Blunting of the left costophrenic angle. Unchanged left IJ port catheter. IMPRESSION: Similar appearance of the chest x-ray to 11/13/2017, with redemonstration of small right pleural effusion, likely trace left pleural effusion. Extensive emphysema, including left-sided bullous disease. Unchanged appearance of right suprahilar lung nodule. Unchanged port catheter. Electronically Signed   By: Corrie Mckusick D.O.   On: 12/01/2017 17:27    EKG:  Sinus tachycardia no ST  elevation or depression old anteroseptal T waves  IMPRESSION AND PLAN:   77 year old male with history of lung cancer, chronic hypoxic respiratory failure on 3 L oxygen due to COPD who presents with shortness of breath and wheezing.  1.  Acute on chronic hypoxic respiratory failure in the setting of COPD exacerbation with possible pneumonia (however less likely) Wean BiPAP to nasal cannula as tolerated Case discussed with intensivist Patient to be admitted to stepdown unit  2.  Acute exacerbation of COPD: Solu-Medrol 60 mg IV every 12 hours and transition to oral prednisone as tolerated Duo nebs and inhalers  3.  Possible HCAP:  Cefepime Hold off on vancomycin at this time  4.  History of lung cancer: Patient is followed by radiation oncology as well as oncology. Consultation if needed  5.  Diabetes: Sliding scale initiated  6.  PAF: Continue amiodarone and Eliquis  7.  CAD: Continue Toprol and statin   8.  Chronic diastolic heart failure without signs of exacerbation All the records are reviewed and case discussed with ED provider. Management plans discussed with the patient and he is in agreement  CODE STATUS: Full  Critical care TOTAL TIME TAKING CARE OF THIS PATIENT: 50 minutes.  High risk of intubation given multiple hospitalizations for COPD Palliative care consultation requested for goals of care  Torrence Branagan M.D on 12/01/2017 at 6:04 PM  Between 7am to 6pm - Pager - [phone]  After 6pm go to [email or web address] - password [email or web address] [place] [facility]  Office  [phone]  CC: Primary care physician; Danelle Berry, NP

## 2017-12-01 NOTE — Progress Notes (Signed)
eLink Physician-Brief Progress Note Patient Name: SAAMIR ARMSTRONG DOB: June 19, 1941 MRN: [id number]   Date of Service  12/01/2017  HPI/Events of Note  77 y.o. 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 Felt to have AECOPD. Now on BiPAP and admitted to a stepdown bed. PCCM asked to assume care.   eICU Interventions  No new orders.     Intervention Category Evaluation Type: New Patient Evaluation  Lysle Dingwall 12/01/2017, 8:28 PM

## 2017-12-02 ENCOUNTER — Encounter: Payer: Self-pay | Admitting: Family

## 2017-12-02 ENCOUNTER — Inpatient Hospital Stay
Admit: 2017-12-02 | Discharge: 2017-12-02 | Disposition: A | Discharge status: Home / Self Care | Payer: Medicare Other | Attending: Internal Medicine | Admitting: Internal Medicine

## 2017-12-02 ENCOUNTER — Ambulatory Visit: Payer: Medicare Other

## 2017-12-02 DIAGNOSIS — Z9221 Personal history of antineoplastic chemotherapy: Secondary | ICD-10-CM

## 2017-12-02 DIAGNOSIS — J449 Chronic obstructive pulmonary disease, unspecified: Secondary | ICD-10-CM

## 2017-12-02 DIAGNOSIS — J90 Pleural effusion, not elsewhere classified: Secondary | ICD-10-CM

## 2017-12-02 DIAGNOSIS — J9690 Respiratory failure, unspecified, unspecified whether with hypoxia or hypercapnia: Secondary | ICD-10-CM

## 2017-12-02 DIAGNOSIS — Z923 Personal history of irradiation: Secondary | ICD-10-CM

## 2017-12-02 DIAGNOSIS — Z79899 Other long term (current) drug therapy: Secondary | ICD-10-CM

## 2017-12-02 DIAGNOSIS — Z87891 Personal history of nicotine dependence: Secondary | ICD-10-CM

## 2017-12-02 DIAGNOSIS — C3490 Malignant neoplasm of unspecified part of unspecified bronchus or lung: Secondary | ICD-10-CM

## 2017-12-02 DIAGNOSIS — J189 Pneumonia, unspecified organism: Secondary | ICD-10-CM

## 2017-12-02 LAB — GLUCOSE, CAPILLARY
GLUCOSE-CAPILLARY: 238 mg/dL — AB (ref 65–99)
GLUCOSE-CAPILLARY: 173 mg/dL — AB (ref 65–99)
GLUCOSE-CAPILLARY: 148 mg/dL — AB (ref 65–99)
Glucose-Capillary: 219 mg/dL — ABNORMAL HIGH (ref 65–99)
Glucose-Capillary: 138 mg/dL — ABNORMAL HIGH (ref 65–99)

## 2017-12-02 LAB — URINALYSIS, COMPLETE (UACMP) WITH MICROSCOPIC
BILIRUBIN URINE: NEGATIVE
Bacteria, UA: NONE SEEN
Glucose, UA: >=500 mg/dL — AB
Hgb urine dipstick: NEGATIVE
KETONES UR: NEGATIVE mg/dL
Leukocytes, UA: NEGATIVE
Nitrite: NEGATIVE
PH: 5 (ref 5.0–8.0)
Protein, ur: NEGATIVE mg/dL
SPECIFIC GRAVITY, URINE: 1.015 (ref 1.005–1.030)

## 2017-12-02 LAB — BASIC METABOLIC PANEL
Anion gap: 8 (ref 5–15)
BUN: 25 mg/dL — ABNORMAL HIGH (ref 6–20)
CALCIUM: 9.4 mg/dL (ref 8.9–10.3)
CO2: 33 mmol/L — AB (ref 22–32)
CREATININE: 1.38 mg/dL — AB (ref 0.61–1.24)
Chloride: 99 mmol/L — ABNORMAL LOW (ref 101–111)
GFR calc Af Amer: 56 mL/min — ABNORMAL LOW (ref >60)
GFR calc non Af Amer: 48 mL/min — ABNORMAL LOW (ref >60)
GLUCOSE: 260 mg/dL — AB (ref 65–99)
Potassium: 3.9 mmol/L (ref 3.5–5.1)
Sodium: 140 mmol/L (ref 135–145)

## 2017-12-02 LAB — CBC
HCT: 34.8 % — ABNORMAL LOW (ref 40.0–52.0)
Hemoglobin: 11.4 g/dL — ABNORMAL LOW (ref 13.0–18.0)
MCH: 28.6 pg (ref 26.0–34.0)
MCHC: 32.6 g/dL (ref 32.0–36.0)
MCV: 87.6 fL (ref 80.0–100.0)
PLATELETS: 116 10*3/uL — AB (ref 150–440)
RBC: 3.98 MIL/uL — ABNORMAL LOW (ref 4.40–5.90)
RDW: 15.7 % — ABNORMAL HIGH (ref 11.5–14.5)
WBC: 13.9 10*3/uL — ABNORMAL HIGH (ref 3.8–10.6)

## 2017-12-02 MED ORDER — INSULIN ASPART 100 UNIT/ML ~~LOC~~ SOLN: 0.0000 [IU] | Freq: Every day | SUBCUTANEOUS | Status: DC

## 2017-12-02 MED ORDER — BUDESONIDE 0.5 MG/2ML IN SUSP
0.5000 mg | Freq: Two times a day (BID) | RESPIRATORY_TRACT | Status: DC
  Administered 2017-12-02 – 2017-12-04 (×4): 0.5 mg via RESPIRATORY_TRACT
  Filled 2017-12-02 (×4): qty 2

## 2017-12-02 MED ORDER — IPRATROPIUM-ALBUTEROL 0.5-2.5 (3) MG/3ML IN SOLN
3.0000 mL | RESPIRATORY_TRACT | Status: DC
  Administered 2017-12-02 – 2017-12-04 (×9): 3 mL via RESPIRATORY_TRACT
  Filled 2017-12-02 (×10): qty 3

## 2017-12-02 MED ORDER — METHYLPREDNISOLONE SODIUM SUCC 40 MG IJ SOLR
40.0000 mg | Freq: Two times a day (BID) | INTRAMUSCULAR | Status: DC
  Administered 2017-12-02 – 2017-12-04 (×4): 40 mg via INTRAVENOUS
  Filled 2017-12-02 (×4): qty 1

## 2017-12-02 MED ORDER — INSULIN ASPART 100 UNIT/ML ~~LOC~~ SOLN
0.0000 [IU] | Freq: Three times a day (TID) | SUBCUTANEOUS | Status: DC
  Administered 2017-12-02: 3 [IU] via SUBCUTANEOUS
  Administered 2017-12-02 – 2017-12-03 (×4): 2 [IU] via SUBCUTANEOUS
  Administered 2017-12-04: 3 [IU] via SUBCUTANEOUS
  Administered 2017-12-04: 09:00:00 2 [IU] via SUBCUTANEOUS
  Filled 2017-12-02 (×7): qty 1

## 2017-12-02 MED ORDER — SODIUM CHLORIDE 0.9 % IV SOLN
1.0000 g | Freq: Every day | INTRAVENOUS | Status: DC
  Administered 2017-12-02 – 2017-12-03 (×2): 1 g via INTRAVENOUS
  Filled 2017-12-02: qty 1
  Filled 2017-12-02 (×2): qty 10

## 2017-12-02 MED ORDER — AZITHROMYCIN 500 MG PO TABS
500.0000 mg | ORAL_TABLET | Freq: Every day | ORAL | Status: DC
  Administered 2017-12-02 – 2017-12-04 (×3): 500 mg via ORAL
  Filled 2017-12-02 (×3): qty 1

## 2017-12-02 MED ORDER — INSULIN DETEMIR 100 UNIT/ML ~~LOC~~ SOLN
10.0000 [IU] | Freq: Every day | SUBCUTANEOUS | Status: DC
  Administered 2017-12-02 – 2017-12-04 (×3): 10 [IU] via SUBCUTANEOUS
  Filled 2017-12-02 (×3): qty 0.1

## 2017-12-02 NOTE — Progress Notes (Signed)
Inpatient Diabetes Program Recommendations  AACE/ADA: New Consensus Statement on Inpatient Glycemic Control (2015)  Target Ranges:  Prepandial:   less than 140 mg/dL      Peak postprandial:   less than 180 mg/dL (1-2 hours)      Critically ill patients:  140 - 180 mg/dL   Lab Results  Component Value Date   GLUCAP 219 (H) 12/02/2017   HGBA1C 7.1 (H) 11/13/2017    Review of Glycemic ControlResults for NIK, GORRELL (MRN [id number]) as of 12/02/2017 10:51  Ref. Range 12/01/2017 21:18 12/02/2017 08:05  Glucose-Capillary Latest Ref Range: 65 - 99 mg/dL 237 (H) 219 (H)   Diabetes history: DM2 Outpatient Diabetes medications: Tradjenta 5 mg daily Current orders for Inpatient glycemic control:  Novolog moderate tid with meals and HS, Solumedrol 40 mg IV q 12 hours Inpatient Diabetes Program Recommendations:   Note blood sugars elevated with steroids.  Consider adding Levemir 12 units q AM while on steroids.    Thanks,  Adah Perl, RN, BC-ADM Inpatient Diabetes Coordinator Pager [phone] (8a-5p)

## 2017-12-02 NOTE — Progress Notes (Signed)
*  PRELIMINARY RESULTS* Echocardiogram 2D Echocardiogram has been performed.  Sherrie Sport 12/02/2017, 1:39 PM

## 2017-12-02 NOTE — Progress Notes (Signed)
Pharmacy Antibiotic Note  Chris Wilkinson is a 77 y.o. male admitted on 12/01/2017 with pneumonia/COPD exacerbation. Pharmacy has been consulted for ceftriaxone dosing.   Plan: Will initiate ceftriaxone 1 g IV daily. Will continue azithromycin 500 mg PO daily x 5 days  Height: 5\' 11"  (180.3 cm) Weight: 180 lb 5.4 oz (81.8 kg) IBW/kg (Calculated) : 75.3  Temp (24hrs), Avg:98 F (36.7 C), Min:97.1 F (36.2 C), Max:98.9 F (37.2 C)  Recent Labs  Lab 12/01/17 1649 12/01/17 1924 12/02/17 0358  WBC 13.0*  --  13.9*  CREATININE  --  1.45* 1.38*  LATICACIDVEN 2.5* 2.7*  --     Estimated Creatinine Clearance: 49.3 mL/min (A) (by C-G formula based on SCr of 1.38 mg/dL (H)).    No Known Allergies  Antimicrobials this admission: Cefepime 5/21 >> 5/22 Vancomycin 5/21 >> 5/22 Azithromycin 5/22 >> 5/26 Ceftriaxone 5/22 >>  Dose adjustments this admission:   Microbiology results: 5/21 BCx: no growth < 12 hours 5/21 UCx: pending  5/22 Sputum: pending  5/21 MRSA PCR: negative  Thank you for allowing pharmacy to be a part of this patient's care.  Ricardo Jericho 12/02/2017 1:49 PM

## 2017-12-02 NOTE — Consult Note (Signed)
[facility]  Date of admission:  12/02/2017  Inpatient day:  12/02/2017  Consulting physician:  Dr. Flora Lipps   Reason for Consultation:  Patient has a history of lung cancer.  Chief Complaint: Chris Wilkinson is a 77 y.o. 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  He presented with pneumonia and a right hilar mass.  He underwent concurrent chemotherapy and radiation.  He achieved a partial remission.  He then received durvalumab x 4 beginning in 02/2017.  Treatment was discontinued secondary to pneumonitis possibly related to durvalumab versus radiation.  He was then placed on surveillance.  Chest CT in 08/2017 revealed a new right upper lobe nodule measuring 1.3 cm concerning for recurrence.  Initial plan was for IMRT beginning 11/16/2017.  The patient was admitted to [facility] from 11/13/2017 - 11/16/2017 with shortness of breath and cough.  Chest CT angiogram on 11/13/2017 revealed no pulmonary embolism.  The mass in the posterior segment of the right upper lobe had increased.  There was a large right pleural effusion with consolidation of the right upper lobe.  There was an enlarged left axillary lymph node and liver lesions incompletely visualized possibly representing metastatic disease.  Plan was for PET scan outpatient department.  Unfortunately PET scan was not performed as the patient became weak and was subsequently admitted.  The patient states that he feels very similar to his last admission.  For the past several days he has had increasing shortness of breath and dyspnea on exertion.  He has a nonproductive cough.  He denies any fever.  He has been very fatigued.  He was seen in the emergency room.  At baseline the patient is  on 3 L of oxygen via nasal cannula.  In the emergency room, he was placed on BiPAP and received steroids.  He has been treated empirically with cefepime and vancomycin.  Chest x-ray revealed no acute changes.  Symptomatically, he is feeling better.   Past Medical History:  Diagnosis Date  . Cancer ([place])    Lung CA  . CHF (congestive heart failure) ([place])   . COPD (chronic obstructive pulmonary disease) ([place])   . Coronary artery disease   . Diabetes mellitus without complication ([place])   . Fibromyalgia   . GERD (gastroesophageal reflux disease)   . Hypertension   . Pulmonary emboli [facility])     Past Surgical History:  Procedure Laterality Date  . COLONOSCOPY WITH PROPOFOL N/A 01/18/2015   Procedure: COLONOSCOPY WITH PROPOFOL;  Surgeon: Manya Silvas, MD;  Location: [facility] ENDOSCOPY;  Service: Endoscopy;  Laterality: N/A;  . ESOPHAGOGASTRODUODENOSCOPY N/A 01/18/2015   Procedure: ESOPHAGOGASTRODUODENOSCOPY (EGD);  Surgeon: Manya Silvas, MD;  Location: [facility] ENDOSCOPY;  Service: Endoscopy;  Laterality: N/A;  . EYE SURGERY    . FLEXIBLE BRONCHOSCOPY N/A 10/22/2016   Procedure: FLEXIBLE BRONCHOSCOPY;  Surgeon: Allyne Gee, MD;  Location: [facility] ORS;  Service: Pulmonary;  Laterality: N/A;  . HERNIA REPAIR    . IR FLUORO GUIDE PORT INSERTION LEFT  11/07/2016  . REPAIR KNEE LIGAMENT    . TONSILLECTOMY      Family History  Problem Relation Age of Onset  . Prostate cancer Father   . Prostate cancer Brother   . Diabetes Unknown  all 13 siblings  . Hypertension Unknown        all 13 siblings    Social History:  reports that he quit smoking about 14 years ago. He has never used smokeless tobacco. He reports that he drank about 1.2 oz of alcohol per week. He reports that he does not use drugs.  He is alone today.  Allergies: No Known Allergies  Medications Prior to Admission  Medication Sig Dispense Refill  . acetaminophen (TYLENOL) 325 MG tablet Take 2 tablets (650 mg total) by  mouth every 6 (six) hours as needed for mild pain (or Fever >/= 101).    Marland Kitchen amiodarone (PACERONE) 200 MG tablet Take 1 tablet by mouth daily.    Marland Kitchen apixaban (ELIQUIS) 5 MG TABS tablet Take 1 tablet (5 mg total) by mouth 2 (two) times daily. 60 tablet 2  . ipratropium-albuterol (DUONEB) 0.5-2.5 (3) MG/3ML SOLN Take 3 mLs by nebulization every 6 (six) hours as needed. 120 mL 3  . metoprolol succinate (TOPROL-XL) 50 MG 24 hr tablet Take 50 mg by mouth daily. Take with or immediately following a meal.    . mometasone-formoterol (DULERA) 100-5 MCG/ACT AERO Inhale 2 puffs into the lungs 2 times daily at 12 noon and 4 pm. 1 Inhaler 4  . oxyCODONE 10 MG TABS Take 1 tablet (10 mg total) by mouth every 8 (eight) hours as needed for severe pain. 30 tablet 0  . polyethylene glycol (MIRALAX / GLYCOLAX) packet Take 17 g by mouth daily as needed for mild constipation. 14 each 0  . primidone (MYSOLINE) 50 MG tablet Take 1 tablet by mouth daily.    . simvastatin (ZOCOR) 10 MG tablet Take 10 mg by mouth every evening.     . TRADJENTA 5 MG TABS tablet Take 5 mg by mouth daily.    . Vitamin D, Ergocalciferol, (DRISDOL) 50000 units CAPS capsule Take 1 capsule by mouth every Thursday.    . NONFORMULARY OR COMPOUNDED ITEM Take 30 mLs by mouth every 6 (six) hours as needed for up to 2 doses. Mix Maalox + 2% Viscous Lidocaine + Donnatal in equal amounts. Take 30 ml PO q6hrs. Do not exceed 3 doses per day. (Patient not taking: Reported on 12/01/2017) 1 each 0  . predniSONE (STERAPRED UNI-PAK 21 TAB) 10 MG (21) TBPK tablet Take 1 tablet (10 mg total) by mouth daily. Take 6 tablets by mouth for 1 day followed by  5 tablets by mouth for 1 day followed by  4 tablets by mouth for 1 day followed by  3 tablets by mouth for 1 day followed by  2 tablets by mouth for 1 day followed by  1 tablet by mouth for a day and stop (Patient not taking: Reported on 12/01/2017) 21 tablet 0    Review of Systems: GENERAL:  Fatigue.  No fevers,  sweats or weight loss. PERFORMANCE STATUS (ECOG):  2 HEENT:  Runny nose.  No visual changes, sore throat, mouth sores or tenderness. Lungs: Shortness of breath.  Non-productive cough.  No hemoptysis. Cardiac:  No chest pain, palpitations, orthopnea, or PND. GI:  No appetite.  No nausea, vomiting, diarrhea, constipation, melena or hematochezia. GU:  No urgency, frequency, dysuria, or hematuria. Musculoskeletal:  No back pain.  No joint pain.  No muscle tenderness. Extremities:  No pain or swelling. Skin:  No rashes or skin changes. Neuro:  General weakness.  No headache, numbness or weakness, balance or coordination issues. Endocrine:  Diabetes.  No thyroid issues,  hot flashes or night sweats. Psych:  No mood changes, depression or anxiety. Pain:  No focal pain. Review of systems:  All other systems reviewed and found to be negative.  Physical Exam:  Blood pressure (!) 122/59, pulse 73, temperature 97.7 F (36.5 C), temperature source Oral, resp. rate 15, height _0  (1.803 m), weight 180 lb 5.4 oz (81.8 kg), SpO2 99 %.  GENERAL:  Chronically fatigued appearing gentleman sitting up on the medical unit watching TV in no acute distress. MENTAL STATUS:  Alert and oriented to person, place and time. HEAD:  Curly gray hair.  Mustache.  Male pattern baldness.  Normocephalic, atraumatic, face symmetric, no Cushingoid features. EYES:  Brown eyes.  Arcus.  Pupils equal round and reactive to light and accomodation.  No conjunctivitis or scleral icterus. ENT:  Oropharynx clear without lesion.  Tongue normal. Mucous membranes moist.  RESPIRATORY:  Coarse breath sounds bilaterally.  Diffuse wheezing.  CARDIOVASCULAR:  Regular rate and rhythm without murmur, rub or gallop. ABDOMEN:  Soft, non-tender, with active bowel sounds, and no hepatosplenomegaly.  No masses. SKIN:  No rashes, ulcers or lesions. EXTREMITIES: No edema, no skin discoloration or tenderness.  No palpable cords. LYMPH NODES: No  palpable cervical, supraclavicular, axillary or inguinal adenopathy  NEUROLOGICAL: Unremarkable. PSYCH:  Appropriate.   Results for orders placed or performed during the hospital encounter of 12/01/17 (from the past 48 hour(s))  Urinalysis, Complete w Microscopic     Status: Abnormal   Collection Time: 12/01/17 12:15 AM  Result Value Ref Range   Color, Urine YELLOW (A) YELLOW   APPearance CLEAR (A) CLEAR   Specific Gravity, Urine 1.015 1.005 - 1.030   pH 5.0 5.0 - 8.0   Glucose, UA >=500 (A) NEGATIVE mg/dL   Hgb urine dipstick NEGATIVE NEGATIVE   Bilirubin Urine NEGATIVE NEGATIVE   Ketones, ur NEGATIVE NEGATIVE mg/dL   Protein, ur NEGATIVE NEGATIVE mg/dL   Nitrite NEGATIVE NEGATIVE   Leukocytes, UA NEGATIVE NEGATIVE   RBC / HPF 0-5 0 - 5 RBC/hpf   WBC, UA 0-5 0 - 5 WBC/hpf   Bacteria, UA NONE SEEN NONE SEEN   Squamous Epithelial / LPF 0-5 0 - 5   Mucus PRESENT    Hyaline Casts, UA PRESENT     Comment: Performed at [facility], [address] [place], [place] [address]  Lactic acid, plasma     Status: Abnormal   Collection Time: 12/01/17  4:49 PM  Result Value Ref Range   Lactic Acid, Venous 2.5 (HH) 0.5 - 1.9 mmol/L    Comment: CRITICAL RESULT CALLED TO, READ BACK BY AND VERIFIED WITH STEPHANIE RUDD 12/01/17 1753 KLW Performed at [facility], [address] [place], [place] [address]   CBC WITH DIFFERENTIAL     Status: Abnormal   Collection Time: 12/01/17  4:49 PM  Result Value Ref Range   WBC 13.0 (H) 3.8 - 10.6 K/uL   RBC 4.95 4.40 - 5.90 MIL/uL   Hemoglobin 14.0 13.0 - 18.0 g/dL   HCT 43.2 40.0 - 52.0 %   MCV 87.2 80.0 - 100.0 fL   MCH 28.4 26.0 - 34.0 pg   MCHC 32.5 32.0 - 36.0 g/dL   RDW 16.3 (H) 11.5 - 14.5 %   Platelets 175 150 - 440 K/uL   Neutrophils Relative % 86 %   Neutro Abs 11.2 (H) 1.4 - 6.5 K/uL   Lymphocytes Relative 6 %   Lymphs Abs 0.8 (L) 1.0 - 3.6 K/uL  Monocytes Relative 8 %   Monocytes Absolute 1.0 0.2 - 1.0 K/uL    Eosinophils Relative 0 %   Eosinophils Absolute 0.1 0 - 0.7 K/uL   Basophils Relative 0 %   Basophils Absolute 0.0 0 - 0.1 K/uL    Comment: Performed at [facility], [address] [place], [place] [address]  Blood gas, venous ([place], [facility], [facility])     Status: Abnormal (Preliminary result)   Collection Time: 12/01/17  4:49 PM  Result Value Ref Range   FIO2 0.40    Delivery systems BILEVEL POSITIVE AIRWAY PRESSURE    pH, Ven 7.33 7.250 - 7.430   pCO2, Ven 79 (HH) 44.0 - 60.0 mmHg    Comment: CRITICAL RESULT CALLED TO, READ BACK BY AND VERIFIED WITH:  ROBINSON AT 1710, 12/01/2017, LS    pO2, Ven PENDING 32.0 - 45.0 mmHg   Bicarbonate 41.7 (H) 20.0 - 28.0 mmol/L   Acid-Base Excess 11.9 (H) 0.0 - 2.0 mmol/L   O2 Saturation PENDING %   Patient temperature 37.0    Collection site VENOUS    Sample type VENOUS     Comment: Performed at [facility], [address] [place], [place] [address]  Brain natriuretic peptide     Status: None   Collection Time: 12/01/17  4:50 PM  Result Value Ref Range   B Natriuretic Peptide 81.0 0.0 - 100.0 pg/mL    Comment: Performed at [facility], [address], [place], [place] [address]  Blood Culture (routine x 2)     Status: None (Preliminary result)   Collection Time: 12/01/17  4:56 PM  Result Value Ref Range   Specimen Description BLOOD BLOOD RIGHT HAND    Special Requests      BOTTLES DRAWN AEROBIC AND ANAEROBIC Blood Culture results may not be optimal due to an inadequate volume of blood received in culture bottles   Culture      NO GROWTH < 12 HOURS Performed at [facility], [address] [place], [place] [address]    Report Status PENDING   Blood Culture (routine x 2)     Status: None (Preliminary result)   Collection Time: 12/01/17  4:56 PM  Result Value Ref Range   Specimen Description BLOOD BLOOD LEFT HAND    Special Requests      BOTTLES DRAWN AEROBIC AND ANAEROBIC Blood Culture results may not be  optimal due to an inadequate volume of blood received in culture bottles   Culture      NO GROWTH < 12 HOURS Performed at [facility], [address], [place], [place] [address]    Report Status PENDING   Lactic acid, plasma     Status: Abnormal   Collection Time: 12/01/17  7:24 PM  Result Value Ref Range   Lactic Acid, Venous 2.7 (HH) 0.5 - 1.9 mmol/L    Comment: CRITICAL RESULT CALLED TO, READ BACK BY AND VERIFIED WITH HENRY RIVERA 12/01/17 1957 KLW Performed at [facility], [address] [place],  [address]   Comprehensive metabolic panel     Status: Abnormal   Collection Time: 12/01/17  7:24 PM  Result Value Ref Range   Sodium 140 135 - 145 mmol/L   Potassium 3.3 (L) 3.5 - 5.1 mmol/L   Chloride 95 (L) 101 - 111 mmol/L   CO2 32 22 - 32 mmol/L   Glucose, Bld 197 (H) 65 - 99 mg/dL   BUN 22 (H) 6 - 20  mg/dL   Creatinine, Ser 1.45 (H) 0.61 - 1.24 mg/dL   Calcium 10.0 8.9 - 10.3 mg/dL   Total Protein 8.4 (H) 6.5 - 8.1 g/dL   Albumin 3.2 (L) 3.5 - 5.0 g/dL   AST 21 15 - 41 U/L   ALT 18 17 - 63 U/L   Alkaline Phosphatase 93 38 - 126 U/L   Total Bilirubin 1.0 0.3 - 1.2 mg/dL   GFR calc non Af Amer 46 (L) >60 mL/min   GFR calc Af Amer 53 (L) >60 mL/min    Comment: (NOTE) The eGFR has been calculated using the CKD EPI equation. This calculation has not been validated in all clinical situations. eGFR's persistently <60 mL/min signify possible Chronic Kidney Disease.    Anion gap 13 5 - 15    Comment: Performed at [facility], [address] [place], [place] [address]  Procalcitonin     Status: None   Collection Time: 12/01/17  7:24 PM  Result Value Ref Range   Procalcitonin 0.35 ng/mL    Comment:        Interpretation: PCT (Procalcitonin) <= 0.5 ng/mL: Systemic infection (sepsis) is not likely. Local bacterial infection is possible. (NOTE)       Sepsis PCT Algorithm           Lower Respiratory Tract                                       Infection PCT Algorithm    ----------------------------     ----------------------------         PCT < 0.25 ng/mL                PCT < 0.10 ng/mL         Strongly encourage             Strongly discourage   discontinuation of antibiotics    initiation of antibiotics    ----------------------------     -----------------------------       PCT 0.25 - 0.50 ng/mL            PCT 0.10 - 0.25 ng/mL               OR       >80% decrease in PCT            Discourage initiation of                                            antibiotics      Encourage discontinuation           of antibiotics    ----------------------------     -----------------------------         PCT >= 0.50 ng/mL              PCT 0.26 - 0.50 ng/mL               AND        <80% decrease in PCT             Encourage initiation of  antibiotics       Encourage continuation           of antibiotics    ----------------------------     -----------------------------        PCT >= 0.50 ng/mL                  PCT > 0.50 ng/mL               AND         increase in PCT                  Strongly encourage                                      initiation of antibiotics    Strongly encourage escalation           of antibiotics                                     -----------------------------                                           PCT <= 0.25 ng/mL                                                 OR                                        > 80% decrease in PCT                                     Discontinue / Do not initiate                                             antibiotics Performed at [facility], [address] [place], [place] [address]   Troponin I     Status: Abnormal   Collection Time: 12/01/17  7:24 PM  Result Value Ref Range   Troponin I 0.03 (HH) <0.03 ng/mL    Comment: CRITICAL RESULT CALLED TO, READ BACK BY AND VERIFIED WITH HENRY RIVERA 12/01/17 1957  KLW Performed at [facility], [address] [place], [place] [address]   Glucose, capillary     Status: Abnormal   Collection Time: 12/01/17  8:17 PM  Result Value Ref Range   Glucose-Capillary 238 (H) 65 - 99 mg/dL   Comment 1 Notify RN   MRSA PCR Screening     Status: None   Collection Time: 12/01/17  8:32 PM  Result Value Ref Range   MRSA by PCR NEGATIVE NEGATIVE    Comment:        The GeneXpert MRSA Assay (FDA approved for NASAL specimens only), is one component of a comprehensive MRSA colonization  surveillance program. It is not intended to diagnose MRSA infection nor to guide or monitor treatment for MRSA infections. Performed at [facility], [address] [place], [place] [address]   Glucose, capillary     Status: Abnormal   Collection Time: 12/01/17  9:18 PM  Result Value Ref Range   Glucose-Capillary 237 (H) 65 - 99 mg/dL  Basic metabolic panel     Status: Abnormal   Collection Time: 12/02/17  3:58 AM  Result Value Ref Range   Sodium 140 135 - 145 mmol/L   Potassium 3.9 3.5 - 5.1 mmol/L   Chloride 99 (L) 101 - 111 mmol/L   CO2 33 (H) 22 - 32 mmol/L   Glucose, Bld 260 (H) 65 - 99 mg/dL   BUN 25 (H) 6 - 20 mg/dL   Creatinine, Ser 1.38 (H) 0.61 - 1.24 mg/dL   Calcium 9.4 8.9 - 10.3 mg/dL   GFR calc non Af Amer 48 (L) >60 mL/min   GFR calc Af Amer 56 (L) >60 mL/min    Comment: (NOTE) The eGFR has been calculated using the CKD EPI equation. This calculation has not been validated in all clinical situations. eGFR's persistently <60 mL/min signify possible Chronic Kidney Disease.    Anion gap 8 5 - 15    Comment: Performed at [facility], [address] [place], [place] [address]  CBC     Status: Abnormal   Collection Time: 12/02/17  3:58 AM  Result Value Ref Range   WBC 13.9 (H) 3.8 - 10.6 K/uL   RBC 3.98 (L) 4.40 - 5.90 MIL/uL   Hemoglobin 11.4 (L) 13.0 - 18.0 g/dL   HCT 34.8 (L) 40.0 - 52.0 %   MCV 87.6 80.0 - 100.0 fL    MCH 28.6 26.0 - 34.0 pg   MCHC 32.6 32.0 - 36.0 g/dL   RDW 15.7 (H) 11.5 - 14.5 %   Platelets 116 (L) 150 - 440 K/uL    Comment: Performed at [facility], [address] [place], [place] [address]  Glucose, capillary     Status: Abnormal   Collection Time: 12/02/17  8:05 AM  Result Value Ref Range   Glucose-Capillary 219 (H) 65 - 99 mg/dL  Glucose, capillary     Status: Abnormal   Collection Time: 12/02/17 11:32 AM  Result Value Ref Range   Glucose-Capillary 173 (H) 65 - 99 mg/dL  Glucose, capillary     Status: Abnormal   Collection Time: 12/02/17  4:37 PM  Result Value Ref Range   Glucose-Capillary 148 (H) 65 - 99 mg/dL   Dg Chest Port 1 View  Result Date: 12/01/2017 CLINICAL DATA:  77 year old male with a history of respiratory distress EXAM: PORTABLE CHEST 1 VIEW COMPARISON:  11/13/2017 FINDINGS: Cardiomediastinal silhouette is unchanged in size and contour. Opacity at the right lung base obscuring the right hemidiaphragm and the right heart border is unchanged from the comparison plain film and CT. Nodule in the right suprahilar region is unchanged. Changes of emphysema bilaterally, worst on the left where there is significant bullous disease replacing majority of the left lung parenchyma. Blunting of the left costophrenic angle. Unchanged left IJ port catheter. IMPRESSION: Similar appearance of the chest x-ray to 11/13/2017, with redemonstration of small right pleural effusion, likely trace left pleural effusion. Extensive emphysema, including left-sided bullous disease. Unchanged appearance of right suprahilar lung nodule. Unchanged port catheter. Electronically Signed   By: Corrie Mckusick D.O.   On: 12/01/2017 17:27  Assessment:  The patient is a 77 y.o. gentleman with stage IIIB ([id number]) squamous cell lung cancer  s/p concurrent chemotherapy and radiation followed by a truncated course of durvalumab.   Chest CT angiogram on 11/13/2017 an enlarging mass in the  posterior segment of the right upper lobe, an enlarged left axillary lymph node, and liver lesions incompletely visualized possibly representing metastatic disease.  He was admitted with a COPD exacerbation and possibly a healthcare associated pneumonia.  Plan:   1.  Oncology:  Patient is followed by Dr Janese Banks who is currently out of town.  I reviewed with the patient his recent admission and chest CT.  He is aware of an enlarging lung lesion, lesions in his liver as well as axillary adenopathy which could represent metastatic disease.  We reviewed the plan for outpatient PET scan.  He was originally scheduled for SBRT to an isolated lung lesion.  If metastatic disease is documented, we discussed the need for systemic therapy.  Several questions were asked and answered.  He has been agreeable to SBRT to the isolated lung lesion, but would need to consider if he wishes to pursue chemotherapy.  2.  Pulmonary:  COPD flare and possible HCAP.  Patient on [place], Duoneb, inhalers, steroids.  He is on empiric Cefipime.   Thank you for allowing me to participate in AARIC DOLPH 's care.  I will follow him closely with you while hospitalized and after discharge in the outpatient department.   Lequita Asal, MD  12/02/2017, 10:08 PM

## 2017-12-02 NOTE — Consult Note (Addendum)
Consultation Note Date: 12/02/2017   Patient Name: Chris Wilkinson  DOB: 1940/10/11  MRN: [id number]  Age / Sex: 77 y.o., male  PCP: Danelle Berry, NP Referring Physician: Fritzi Mandes, MD  Reason for Consultation: Establishing goals of care  HPI/Patient Profile: Chris Wilkinson  is a 77 y.o. 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 Patient reports over the past several days he has had increasing shortness of breath, dyspnea exertion, wheezing and cough with sputum production.  He denies fever however had a low-grade temperature upon arrival to the ER today.  Patient was recently discharged from the hospital due to COPD exacerbation along with healthcare acquired pneumonia.    Clinical Assessment and Goals of Care: Patient is resting in bed. His wife is at bedside. He lives at home with his wife. They have 2 living children. One child died when she was 33 years old with heart problems. He states that he was treated a year ago with chemotherapy and radiation for lung cancer and thought that he was cured. He was advised that he has lung cancer again, but every time he gets ready to go to an appointment he is weak and does not feel like going.  At home he spends his time in bed or in the recliner because he feels he cannot walk and cannot breathe. He watches football and wrestling. He uses 2-3 L of oxygen at baseline. His appetite has been good up until one day ago.  We discussed his diagnoses, prognosis, GOC, EOL wishes disposition and options.  A detailed discussion was had today regarding advanced directives.    The difference between an aggressive medical intervention path and a hospice comfort care path for this patient at this time was  discussed.  Values and goals of care important to patient and family were attempted to be elicited.  He states he has been told that his COPD is severe, and that even if he treats the lung cancer it will not improve his respiratory status. He states that in order to do more radiation he would need to be able to lay flat however he has not been able to lay flat since March of last year.   He states that he would never want to have hospice and he would never want to have morphine, his wife agrees with this. His wife explains that their daughter was receiving morphine injections prior to dying of heart failure. She did not have hospice at the time and was at the hospital, but she states the nurses were giving her the medication, and she knew that with every dose her daughter was dying. She states that she finally told the staff that they were killing her with the morphine, and to stop giving her the morphine and to let her go ahead and die. This  statement was reflected, and she states that she knew that she would die with or without the medication. It was explained that these medications are used for comfort for whatever time a person has left, and also to make people more comfortable in the end of life process. She also states that she does not want hospice care because with hospice you are given morphine until you die; the patient agrees.  Chris Wilkinson states that when he does not feel well, the first thing that he thinks about is coming back to the hospital. He states that the hospital makes him feel better for about 3-4 days. His wife states that he will continue to come to the hospital for whatever time he has left. He envisions his end-of-life and death being in a hospital setting.  He states that he would never want chest compressions, shocks, or breathing tube if his heart or lungs stop. He would not want to be placed on a breathing machine for respiratory distress.  They are amenable to palliative  following at home.  MOST form reviewed and completed with patient and spouse. Completed for DNR, limited additional interventions, IVF, abx, feeding tube okay.     SUMMARY OF RECOMMENDATIONS    DO NOT RESUSCITATE/DO NOT INTUBATE.   Recommend palliative to follow outpatient.  Code Status/Advance Care Planning:  DNR    Symptom Management:   Per primary team.  Palliative Prophylaxis:   Eye Care and Oral Care   Prognosis:   < 6 months without oncologic tx.  Discharge Planning: To Be Determined      Primary Diagnoses: Present on Admission: . COPD exacerbation ([place])   I have reviewed the medical record, interviewed the patient and family, and examined the patient. The following aspects are pertinent.  Past Medical History:  Diagnosis Date  . Cancer ([place])    Lung CA  . CHF (congestive heart failure) ([place])   . COPD (chronic obstructive pulmonary disease) ([place])   . Coronary artery disease   . Diabetes mellitus without complication ([place])   . Fibromyalgia   . GERD (gastroesophageal reflux disease)   . Hypertension   . Pulmonary emboli [facility])    Social History   Socioeconomic History  . Marital status: Married    Spouse name: Not on file  . Number of children: Not on file  . Years of education: Not on file  . Highest education level: Not on file  Occupational History  . Occupation: retired  Scientific laboratory technician  . Financial resource strain: Not on file  . Food insecurity:    Worry: Not on file    Inability: Not on file  . Transportation needs:    Medical: Not on file    Non-medical: Not on file  Tobacco Use  . Smoking status: Former Smoker    Last attempt to quit: 12/01/2003    Years since quitting: 14.0  . Smokeless tobacco: Never Used  Substance and Sexual Activity  . Alcohol use: Not Currently    Alcohol/week: 1.2 oz    Types: 2 Shots of liquor per week    Frequency: Never    Comment: on the weeknd 2 shots  . Drug use: No  . Sexual activity: Not on  file  Lifestyle  . Physical activity:    Days per week: Not on file    Minutes per session: Not on file  . Stress: Not on file  Relationships  . Social connections:    Talks on phone: Not on file  Gets together: Not on file    Attends religious service: Not on file    Active member of club or organization: Not on file    Attends meetings of clubs or organizations: Not on file    Relationship status: Not on file  Other Topics Concern  . Not on file  Social History Narrative  . Not on file   Family History  Problem Relation Age of Onset  . Prostate cancer Father   . Prostate cancer Brother   . Diabetes Unknown        all 13 siblings  . Hypertension Unknown        all 13 siblings   Scheduled Meds: . amiodarone  200 mg Oral Daily  . apixaban  5 mg Oral BID  . azithromycin  500 mg Oral Daily  . budesonide (PULMICORT) nebulizer solution  0.5 mg Nebulization BID  . insulin aspart  0-15 Units Subcutaneous TID WC  . insulin aspart  0-5 Units Subcutaneous QHS  . ipratropium-albuterol  3 mL Nebulization Q4H  . methylPREDNISolone (SOLU-MEDROL) injection  40 mg Intravenous Q12H  . metoprolol succinate  50 mg Oral Daily  . primidone  50 mg Oral Daily  . simvastatin  10 mg Oral QPM  . [START ON 12/03/2017] Vitamin D (Ergocalciferol)  50,000 Units Oral Q Thu   Continuous Infusions: . cefTRIAXone (ROCEPHIN)  IV Stopped (12/02/17 1251)   PRN Meds:.acetaminophen **OR** acetaminophen, hydrALAZINE, ipratropium-albuterol, ondansetron **OR** ondansetron (ZOFRAN) IV, oxyCODONE, polyethylene glycol Medications Prior to Admission:  Prior to Admission medications   Medication Sig Start Date End Date Taking? Authorizing Provider  acetaminophen (TYLENOL) 325 MG tablet Take 2 tablets (650 mg total) by mouth every 6 (six) hours as needed for mild pain (or Fever >/= 101). 09/26/16  Yes Gouru, Illene Silver, MD  amiodarone (PACERONE) 200 MG tablet Take 1 tablet by mouth daily. 11/10/17  Yes [provider]  apixaban (ELIQUIS) 5 MG TABS tablet Take 1 tablet (5 mg total) by mouth 2 (two) times daily. 04/10/17  Yes Verlon Au, NP  ipratropium-albuterol (DUONEB) 0.5-2.5 (3) MG/3ML SOLN Take 3 mLs by nebulization every 6 (six) hours as needed. 10/09/17  Yes Boscia, Greer Ee, NP  metoprolol succinate (TOPROL-XL) 50 MG 24 hr tablet Take 50 mg by mouth daily. Take with or immediately following a meal.   Yes [provider]  mometasone-formoterol (DULERA) 100-5 MCG/ACT AERO Inhale 2 puffs into the lungs 2 times daily at 12 noon and 4 pm. 09/17/17  Yes Allyne Gee, MD  oxyCODONE 10 MG TABS Take 1 tablet (10 mg total) by mouth every 8 (eight) hours as needed for severe pain. 11/16/17 12/16/17 Yes Gouru, Aruna, MD  polyethylene glycol (MIRALAX / GLYCOLAX) packet Take 17 g by mouth daily as needed for mild constipation. 11/16/17  Yes Gouru, Illene Silver, MD  primidone (MYSOLINE) 50 MG tablet Take 1 tablet by mouth daily. 08/18/17  Yes [provider]  simvastatin (ZOCOR) 10 MG tablet Take 10 mg by mouth every evening.    Yes [provider]  TRADJENTA 5 MG TABS tablet Take 5 mg by mouth daily. 08/25/17  Yes [provider]  Vitamin D, Ergocalciferol, (DRISDOL) 50000 units CAPS capsule Take 1 capsule by mouth every Thursday. 08/25/17  Yes [provider]  NONFORMULARY OR COMPOUNDED ITEM Take 30 mLs by mouth every 6 (six) hours as needed for up to 2 doses. Mix Maalox + 2% Viscous Lidocaine + Donnatal in equal amounts. Take 30 ml  PO q6hrs. Do not exceed 3 doses per day. Patient not taking: Reported on 12/01/2017 09/30/17   Milinda Pointer, MD  predniSONE (STERAPRED UNI-PAK 21 TAB) 10 MG (21) TBPK tablet Take 1 tablet (10 mg total) by mouth daily. Take 6 tablets by mouth for 1 day followed by  5 tablets by mouth for 1 day followed by  4 tablets by mouth for 1 day followed by  3 tablets by mouth for 1 day followed by  2 tablets by mouth for 1 day followed by  1 tablet  by mouth for a day and stop Patient not taking: Reported on 12/01/2017 11/16/17   [name], MD  prochlorperazine (COMPAZINE) 10 MG tablet Take 1 tablet (10 mg total) by mouth every 6 (six) hours as needed (Nausea or vomiting). Patient not taking: Reported on 12/04/2016 11/04/16 02/03/17  Sindy Guadeloupe, MD   No Known Allergies Review of Systems  Constitutional: Positive for activity change.  Respiratory: Positive for shortness of breath.     Physical Exam  Constitutional: No distress.  Pulmonary/Chest: Effort normal.  Neurological: He is alert.  Oriented.     Vital Signs: BP 131/81   Pulse 87   Temp 97.6 F (36.4 C) (Oral)   Resp (!) 26   Ht 5\' 11"  (1.803 m)   Wt 81.8 kg (180 lb 5.4 oz)   SpO2 100%   BMI 25.15 kg/m  Pain Scale: 0-10   Pain Score: 0-No pain   SpO2: SpO2: 100 % O2 Device:SpO2: 100 % O2 Flow Rate: .O2 Flow Rate (L/min): 3 L/min  IO: Intake/output summary:   Intake/Output Summary (Last 24 hours) at 12/02/2017 1517 Last data filed at 12/02/2017 1229 Gross per 24 hour  Intake 1335.83 ml  Output 450 ml  Net 885.83 ml    LBM:   Baseline Weight: Weight: 86.2 kg (190 lb) Most recent weight: Weight: 81.8 kg (180 lb 5.4 oz)     Palliative Assessment/Data: 30%     Time In: 2:20 Time Out: 3:40 Time Total:70 min Greater than 50%  of this time was spent counseling and coordinating care related to the above assessment and plan.  Signed by: Asencion Gowda, NP   Please contact Palliative Medicine Team phone at [phone] for questions and concerns.  For individual provider: See Shea Evans

## 2017-12-02 NOTE — Progress Notes (Signed)
Patient not eating much all day- I asked Dr. Mortimer Fries if he would like to restart fluids- he ordered to have no fluids at this time and continue to monitor.

## 2017-12-02 NOTE — Progress Notes (Signed)
[facility] [place] NAME: Chris Wilkinson    MR#:  [id number]  DATE OF BIRTH:  27-Nov-1940  SUBJECTIVE:  patient was just discharge on sixth me came in again with increasing shortness of breath was found to be an acute on chronic hypoxic respiratory failure placed on BiPAP. Now off BiPAP on nasal cannula oxygen. Wife in the room. Patient feels a lot better.  REVIEW OF SYSTEMS:   Review of Systems  Constitutional: Negative for chills, fever and weight loss.  HENT: Negative for ear discharge, ear pain and nosebleeds.   Eyes: Negative for blurred vision, pain and discharge.  Respiratory: Positive for cough and sputum production. Negative for shortness of breath, wheezing and stridor.   Cardiovascular: Negative for chest pain, palpitations, orthopnea and PND.  Gastrointestinal: Negative for abdominal pain, diarrhea, nausea and vomiting.  Genitourinary: Negative for frequency and urgency.  Musculoskeletal: Negative for back pain and joint pain.  Neurological: Negative for sensory change, speech change, focal weakness and weakness.  Psychiatric/Behavioral: Negative for depression and hallucinations. The patient is not nervous/anxious.    Tolerating Diet:yes Tolerating PT: walks short distances at home with walker  DRUG ALLERGIES:  No Known Allergies  VITALS:  Blood pressure 131/81, pulse 87, temperature 97.6 F (36.4 C), temperature source Oral, resp. rate (!) 26, height 5\' 11"  (1.803 m), weight 81.8 kg (180 lb 5.4 oz), SpO2 100 %.  PHYSICAL EXAMINATION:   Physical Exam  GENERAL:  77 y.o.-year-old patient lying in the bed with no acute distress. Appears chronically ill EYES: Pupils equal, round, reactive to light and accommodation. No scleral icterus. HEENT: Head atraumatic, normocephalic. Oropharynx and nasopharynx clear.  NECK:  Supple, no jugular venous distention. No thyroid enlargement, no tenderness.  LUNGS: course breath sounds  bilaterally, no wheezing, rales, rhonchi. No use of accessory muscles of respiration.  CARDIOVASCULAR: S1, S2 normal. No murmurs, rubs, or gallops.  ABDOMEN: Soft, nontender, nondistended. Bowel sounds present. No organomegaly or mass.  EXTREMITIES: No cyanosis, clubbing or edema b/l.    NEUROLOGIC: Cranial nerves II through XII are intact. No focal Motor or sensory deficits b/l.   PSYCHIATRIC:  patient is alert and oriented x 3.  SKIN: No obvious rash, lesion, or ulcer.   LABORATORY PANEL:  CBC Recent Labs  Lab 12/02/17 0358  WBC 13.9*  HGB 11.4*  HCT 34.8*  PLT 116*    Chemistries  Recent Labs  Lab 12/01/17 1924 12/02/17 0358  NA 140 140  K 3.3* 3.9  CL 95* 99*  CO2 32 33*  GLUCOSE 197* 260*  BUN 22* 25*  CREATININE 1.45* 1.38*  CALCIUM 10.0 9.4  AST 21  --   ALT 18  --   ALKPHOS 93  --   BILITOT 1.0  --    Cardiac Enzymes Recent Labs  Lab 12/01/17 1924  TROPONINI 0.03*   RADIOLOGY:  Dg Chest Port 1 View  Result Date: 12/01/2017 CLINICAL DATA:  77 year old male with a history of respiratory distress EXAM: PORTABLE CHEST 1 VIEW COMPARISON:  11/13/2017 FINDINGS: Cardiomediastinal silhouette is unchanged in size and contour. Opacity at the right lung base obscuring the right hemidiaphragm and the right heart border is unchanged from the comparison plain film and CT. Nodule in the right suprahilar region is unchanged. Changes of emphysema bilaterally, worst on the left where there is significant bullous disease replacing majority of the left lung parenchyma. Blunting of the left costophrenic angle. Unchanged left IJ port catheter. IMPRESSION:  Similar appearance of the chest x-ray to 11/13/2017, with redemonstration of small right pleural effusion, likely trace left pleural effusion. Extensive emphysema, including left-sided bullous disease. Unchanged appearance of right suprahilar lung nodule. Unchanged port catheter. Electronically Signed   By: Corrie Mckusick D.O.   On:  12/01/2017 17:27   ASSESSMENT AND PLAN:   77 year old male with history of lung cancer, chronic hypoxic respiratory failure on 3 L oxygen due to COPD who presents with shortness of breath and wheezing.  1.  Acute on chronic hypoxic respiratory failure in the setting of COPD exacerbation with possible pneumonia (however less likely) Weaned BiPAP to nasal cannula as tolerated  2.  Acute exacerbation of COPD: Solu-Medrol 60 mg IV every 12 hours and transition to oral prednisone as tolerated Duo nebs and inhalers  3.  Possible HCAP:  now on Rocephin and azithromycin   4.  History of lung cancer: Patient is followed by radiation oncology as well as oncology. Consultation if needed  5.  Diabetes: Sliding scale initiated  6.  PAF: Continue amiodarone and Eliquis  7.  CAD: Continue Toprol and statin  8.  Chronic diastolic heart failure without signs of exacerbation  Discussed with patient and wife Case discussed with Care Management/Social Worker. Management plans discussed with the patient, family and they are in agreement.  CODE STATUS: full  DVT Prophylaxis: eliquis   TOTAL TIME TAKING CARE OF THIS PATIENT: *30* minutes.  >50% time spent on counselling and coordination of care  POSSIBLE D/C IN1-2 DAYS, DEPENDING ON CLINICAL CONDITION.  Note: This dictation was prepared with Dragon dictation along with smaller phrase technology. Any transcriptional errors that result from this process are unintentional.  Fritzi Mandes M.D on 12/02/2017 at 3:04 PM  Between 7am to 6pm - Pager - [phone]  After 6pm go to [email or web address] - password [email or web address] [facility]  Office  [phone]  CC: Primary care physician; Danelle Berry, NPPatient ID: Chris Wilkinson, male   DOB: 11-Dec-1940, 77 y.o.   MRN: [id number]

## 2017-12-03 DIAGNOSIS — Z515 Encounter for palliative care: Secondary | ICD-10-CM

## 2017-12-03 DIAGNOSIS — Z7189 Other specified counseling: Secondary | ICD-10-CM

## 2017-12-03 DIAGNOSIS — J441 Chronic obstructive pulmonary disease with (acute) exacerbation: Secondary | ICD-10-CM

## 2017-12-03 LAB — BASIC METABOLIC PANEL
ANION GAP: 8 (ref 5–15)
BUN: 28 mg/dL — AB (ref 6–20)
CALCIUM: 9.9 mg/dL (ref 8.9–10.3)
CO2: 33 mmol/L — ABNORMAL HIGH (ref 22–32)
Chloride: 101 mmol/L (ref 101–111)
Creatinine, Ser: 1.21 mg/dL (ref 0.61–1.24)
GFR calc Af Amer: >60 mL/min (ref >60)
GFR, EST NON AFRICAN AMERICAN: 57 mL/min — AB (ref >60)
GLUCOSE: 143 mg/dL — AB (ref 65–99)
Potassium: 4.6 mmol/L (ref 3.5–5.1)
Sodium: 142 mmol/L (ref 135–145)

## 2017-12-03 LAB — CBC WITH DIFFERENTIAL/PLATELET
BASOS ABS: 0 10*3/uL (ref 0–0.1)
BASOS PCT: 0 %
Eosinophils Absolute: 0 10*3/uL (ref 0–0.7)
Eosinophils Relative: 0 %
HEMATOCRIT: 36.6 % — AB (ref 40.0–52.0)
HEMOGLOBIN: 11.8 g/dL — AB (ref 13.0–18.0)
Lymphocytes Relative: 3 %
Lymphs Abs: 0.4 10*3/uL — ABNORMAL LOW (ref 1.0–3.6)
MCH: 28.4 pg (ref 26.0–34.0)
MCHC: 32.3 g/dL (ref 32.0–36.0)
MCV: 87.8 fL (ref 80.0–100.0)
Monocytes Absolute: 0.7 10*3/uL (ref 0.2–1.0)
Monocytes Relative: 5 %
NEUTROS ABS: 12.4 10*3/uL — AB (ref 1.4–6.5)
NEUTROS PCT: 92 %
Platelets: 128 10*3/uL — ABNORMAL LOW (ref 150–440)
RBC: 4.17 MIL/uL — ABNORMAL LOW (ref 4.40–5.90)
RDW: 16 % — ABNORMAL HIGH (ref 11.5–14.5)
WBC: 13.5 10*3/uL — ABNORMAL HIGH (ref 3.8–10.6)

## 2017-12-03 LAB — ECHOCARDIOGRAM COMPLETE
Height: 71 in
Weight: 2885.38 oz

## 2017-12-03 LAB — URINE CULTURE: CULTURE: NO GROWTH

## 2017-12-03 LAB — GLUCOSE, CAPILLARY
GLUCOSE-CAPILLARY: 147 mg/dL — AB (ref 65–99)
GLUCOSE-CAPILLARY: 141 mg/dL — AB (ref 65–99)
GLUCOSE-CAPILLARY: 128 mg/dL — AB (ref 65–99)
Glucose-Capillary: 139 mg/dL — ABNORMAL HIGH (ref 65–99)

## 2017-12-03 MED ORDER — GLUCERNA SHAKE PO LIQD: 237.0000 mL | Freq: Three times a day (TID) | ORAL | Status: DC

## 2017-12-03 MED ORDER — GUAIFENESIN-DM 100-10 MG/5ML PO SYRP
5.0000 mL | ORAL_SOLUTION | ORAL | Status: DC | PRN
  Administered 2017-12-04: 07:00:00 5 mL via ORAL
  Filled 2017-12-03: qty 5

## 2017-12-03 NOTE — Progress Notes (Signed)
[facility] [place] NAME: Chris Wilkinson    MR#:  [id number]  DATE OF BIRTH:  1941/01/04  SUBJECTIVE:  patient was just discharge on sixth me came in again with increasing shortness of breath was found to be an acute on chronic hypoxic respiratory failure placed on BiPAP. Now off BiPAP on nasal cannula oxygen. Wife in the room. Patient feels a lot better now transferred out of ICU REVIEW OF SYSTEMS:   Review of Systems  Constitutional: Negative for chills, fever and weight loss.  HENT: Negative for ear discharge, ear pain and nosebleeds.   Eyes: Negative for blurred vision, pain and discharge.  Respiratory: Positive for cough and sputum production. Negative for shortness of breath, wheezing and stridor.   Cardiovascular: Negative for chest pain, palpitations, orthopnea and PND.  Gastrointestinal: Negative for abdominal pain, diarrhea, nausea and vomiting.  Genitourinary: Negative for frequency and urgency.  Musculoskeletal: Negative for back pain and joint pain.  Neurological: Negative for sensory change, speech change, focal weakness and weakness.  Psychiatric/Behavioral: Negative for depression and hallucinations. The patient is not nervous/anxious.    Tolerating Diet:yes Tolerating PT: walks short distances at home with walker  DRUG ALLERGIES:  No Known Allergies  VITALS:  Blood pressure 116/65, pulse 88, temperature 97.8 F (36.6 C), temperature source Oral, resp. rate 16, height 5\' 11"  (1.803 m), weight 81.9 kg (180 lb 8 oz), SpO2 100 %.  PHYSICAL EXAMINATION:   Physical Exam  GENERAL:  77 y.o.-year-old patient lying in the bed with no acute distress. Appears chronically ill EYES: Pupils equal, round, reactive to light and accommodation. No scleral icterus.  HEENT: Head atraumatic, normocephalic. Oropharynx and nasopharynx clear.  NECK:  Supple, no jugular venous distention. No thyroid enlargement, no tenderness.  LUNGS:  coarse breath sounds bilaterally, no wheezing, rales, rhonchi. No use of accessory muscles of respiration.  CARDIOVASCULAR: S1, S2 normal. No murmurs, rubs, or gallops.  ABDOMEN: Soft, nontender, nondistended. Bowel sounds present. No organomegaly or mass.  EXTREMITIES: No cyanosis, clubbing or edema b/l.    NEUROLOGIC: Cranial nerves II through XII are intact. No focal Motor or sensory deficits b/l.   PSYCHIATRIC:  patient is alert and oriented x 3.  SKIN: No obvious rash, lesion, or ulcer.   LABORATORY PANEL:  CBC Recent Labs  Lab 12/03/17 0620  WBC 13.5*  HGB 11.8*  HCT 36.6*  PLT 128*    Chemistries  Recent Labs  Lab 12/01/17 1924  12/03/17 0620  NA 140   < > 142  K 3.3*   < > 4.6  CL 95*   < > 101  CO2 32   < > 33*  GLUCOSE 197*   < > 143*  BUN 22*   < > 28*  CREATININE 1.45*   < > 1.21  CALCIUM 10.0   < > 9.9  AST 21  --   --   ALT 18  --   --   ALKPHOS 93  --   --   BILITOT 1.0  --   --    < > = values in this interval not displayed.   Cardiac Enzymes Recent Labs  Lab 12/01/17 1924  TROPONINI 0.03*   RADIOLOGY:  Dg Chest Port 1 View  Result Date: 12/01/2017 CLINICAL DATA:  77 year old male with a history of respiratory distress EXAM: PORTABLE CHEST 1 VIEW COMPARISON:  11/13/2017 FINDINGS: Cardiomediastinal silhouette is unchanged in size and contour. Opacity at the right lung  base obscuring the right hemidiaphragm and the right heart border is unchanged from the comparison plain film and CT. Nodule in the right suprahilar region is unchanged. Changes of emphysema bilaterally, worst on the left where there is significant bullous disease replacing majority of the left lung parenchyma. Blunting of the left costophrenic angle. Unchanged left IJ port catheter. IMPRESSION: Similar appearance of the chest x-ray to 11/13/2017, with redemonstration of small right pleural effusion, likely trace left pleural effusion. Extensive emphysema, including left-sided bullous  disease. Unchanged appearance of right suprahilar lung nodule. Unchanged port catheter. Electronically Signed   By: Chris Wilkinson D.O.   On: 12/01/2017 17:27   ASSESSMENT AND PLAN:   77 year old male with history of lung cancer, chronic hypoxic respiratory failure on 3 L oxygen due to COPD who presents with shortness of breath and wheezing.  1.  Acute on chronic hypoxic respiratory failure in the setting of COPD exacerbation with possible pneumonia (however less likely) Weaned BiPAP to nasal cannula as tolerated  2.  Acute on chronic exacerbation of COPD: Solu-Medrol 60 mg IV every 12 hours and transition to oral prednisone as tolerated Duo nebs and inhalers  3. Possible HCAP:  now on Rocephin and azithromycin --change to oral abxs tomorrow  4.  History of lung cancer: Patient is followed by radiation oncology as well as oncology. Sen by Dr Mike Gip -pt had recent CT chest that showed worsening in his lung cancer  5.  Diabetes: Sliding scale initiated Not on any home po meds -check A1c -could be due to high dose of steroids  6.  PAF: Continue amiodarone and Eliquis  7.  CAD: Continue Toprol and statin  8.  Chronic diastolic heart failure without signs of exacerbation  Discussed with patient and wife. Patient gets home health services through [facility]. He gets physical therapy and RN for three times a week. Patient and wife want to continue the services. He has oxygen and nebulizer at home.  Patient is at a very high risk for readmission given his chronic medical condition including his ongoing cancer and poor lung condition.  Case discussed with Care Management/Social Worker. Management plans discussed with the patient, family and they are in agreement.  CODE STATUS: full  DVT Prophylaxis: eliquis   TOTAL TIME TAKING CARE OF THIS PATIENT: *30* minutes.  >50% time spent on counselling and coordination of care  POSSIBLE D/C IN1-2 DAYS, DEPENDING ON CLINICAL  CONDITION.  Note: This dictation was prepared with Dragon dictation along with smaller phrase technology. Any transcriptional errors that result from this process are unintentional.  Chris Wilkinson M.D on 12/03/2017 at 2:53 PM  Between 7am to 6pm - Pager - [phone]  After 6pm go to [email or web address] - password [email or web address] [place] [facility]  Office  [phone]  CC: Primary care physician; Danelle Berry, NPPatient ID: Chris Wilkinson, male   DOB: 1940-07-27, 77 y.o.   MRN: [id number]

## 2017-12-03 NOTE — Progress Notes (Signed)
   CHIEF COMPLAINT:   Chief Complaint  Patient presents with  . Respiratory Distress    Subjective  COPD exacerbation slowly improving CT scans shows extensive lung disease-very poor prognosis On minimal oxygen Off biPAP     Objective   Examination:  General exam: Appears calm and comfortable  Respiratory system: +rhonchi HEENT: [place]/AT, PERRLA, no thrush, no stridor. Cardiovascular system: S1 & S2 heard, RRR. No JVD, murmurs, rubs, gallops or clicks. No pedal edema. Gastrointestinal system: Abdomen is nondistended, soft and nontender. No organomegaly or masses felt. Normal bowel sounds heard. Central nervous system: Alert and oriented. No focal neurological deficits. Extremities: Symmetric 5 x 5 power. Skin: No rashes, lesions or ulcers Psychiatry: Judgement and insight appear normal. Mood & affect appropriate.   VITALS:  height is 5\' 11"  (1.803 m) and weight is 180 lb 12.4 oz (82 kg). His oral temperature is 97.3 F (36.3 C) (abnormal). His blood pressure is 117/77 and his pulse is 75. His respiration is 17 and oxygen saturation is 98%.   I personally reviewed Labs under Results section.  Radiology Reports Dg Chest Port 1 View  Result Date: 12/01/2017 CLINICAL DATA:  77 year old male with a history of respiratory distress EXAM: PORTABLE CHEST 1 VIEW COMPARISON:  11/13/2017 FINDINGS: Cardiomediastinal silhouette is unchanged in size and contour. Opacity at the right lung base obscuring the right hemidiaphragm and the right heart border is unchanged from the comparison plain film and CT. Nodule in the right suprahilar region is unchanged. Changes of emphysema bilaterally, worst on the left where there is significant bullous disease replacing majority of the left lung parenchyma. Blunting of the left costophrenic angle. Unchanged left IJ port catheter. IMPRESSION: Similar appearance of the chest x-ray to 11/13/2017, with redemonstration of small right pleural effusion, likely trace  left pleural effusion. Extensive emphysema, including left-sided bullous disease. Unchanged appearance of right suprahilar lung nodule. Unchanged port catheter. Electronically Signed   By: Corrie Mckusick D.O.   On: 12/01/2017 17:27       Assessment/Plan:  SEVERE COPD EXACERBATION-slowly improving -continue IV steroids as prescribed -continue NEB THERAPY as prescribed -morphine as needed -wean fio2 as needed and tolerated  OK to transfer to gen med floor  Patient is DNR/DNI    Corrin Parker, M.D.  [facility]  Medical Director [place] Director [place] Department

## 2017-12-03 NOTE — Progress Notes (Signed)
This assessment entered in error at wrong time

## 2017-12-03 NOTE — Progress Notes (Signed)
Daily Progress Note   Patient Name: Chris Wilkinson       Date: 12/03/2017 DOB: [birth year]  Age: 77 y.o. MRN#: [id number] Attending Physician: Fritzi Mandes, MD Primary Care Physician: Danelle Berry, NP Admit Date: 12/01/2017  Reason for Consultation/Follow-up: Psychosocial/spiritual support  Subjective: Patient sitting on side of bed. He states he should be going home tomorrow. He states he does not know if this will be a good thing to go home as he does not want to be in the heat. His wife states they have air conditioning. He is feeling better and will be following up with oncology.   Recommend palliative to follow outpatient.    Length of Stay: 2  Current Medications: Scheduled Meds:  . amiodarone  200 mg Oral Daily  . apixaban  5 mg Oral BID  . azithromycin  500 mg Oral Daily  . budesonide (PULMICORT) nebulizer solution  0.5 mg Nebulization BID  . feeding supplement (GLUCERNA SHAKE)  237 mL Oral TID BM  . insulin aspart  0-15 Units Subcutaneous TID WC  . insulin aspart  0-5 Units Subcutaneous QHS  . insulin detemir  10 Units Subcutaneous Daily  . ipratropium-albuterol  3 mL Nebulization Q4H  . methylPREDNISolone (SOLU-MEDROL) injection  40 mg Intravenous Q12H  . metoprolol succinate  50 mg Oral Daily  . primidone  50 mg Oral Daily  . simvastatin  10 mg Oral QPM  . Vitamin D (Ergocalciferol)  50,000 Units Oral Q Thu    Continuous Infusions: . cefTRIAXone (ROCEPHIN)  IV Stopped (12/03/17 1145)    PRN Meds: acetaminophen **OR** acetaminophen, hydrALAZINE, ipratropium-albuterol, ondansetron **OR** ondansetron (ZOFRAN) IV, oxyCODONE, polyethylene glycol  Physical Exam  Constitutional: No distress.  Pulmonary/Chest: Effort normal.  Neurological: He is alert.  Skin: Skin  is warm and dry.            Vital Signs: BP 116/65 (BP Location: Left Arm)   Pulse 88   Temp 97.8 F (36.6 C) (Oral)   Resp 16   Ht 5\' 11"  (1.803 m)   Wt 81.9 kg (180 lb 8 oz)   SpO2 100%   BMI 25.17 kg/m  SpO2: SpO2: 100 % O2 Device: O2 Device: Nasal Cannula O2 Flow Rate: O2 Flow Rate (L/min): 3 L/min  Intake/output summary:   Intake/Output Summary (Last 24  hours) at 12/03/2017 1520 Last data filed at 12/03/2017 1414 Gross per 24 hour  Intake 880 ml  Output 575 ml  Net 305 ml   LBM:   Baseline Weight: Weight: 86.2 kg (190 lb) Most recent weight: Weight: 81.9 kg (180 lb 8 oz)       Palliative Assessment/Data:  60%      Patient Active Problem List   Diagnosis Date Noted  . Pneumonia 11/13/2017  . SOB (shortness of breath) 11/04/2017  . Cough 11/04/2017  . COPD exacerbation ([place]) 10/10/2017  . Cervicalgia 09/30/2017  . Tendinitis of rotator cuff (Right) 09/30/2017  . DJD of AC (acromioclavicular) joint (Right) 09/30/2017  . 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 DDD (degenerative disc disease), cervical 09/30/2017  . Spondylosis without myelopathy or radiculopathy, cervical region 09/30/2017  . Atypical facial pain (Right) 09/30/2017  . Problems influencing health status 09/30/2017  . Abnormal MRI, shoulder (08/20/2011) (Right) 09/30/2017  . Cancer related pain 09/30/2017  . Pain after radiation therapy 09/30/2017  . Elevated sed rate 09/30/2017  . Elevated C-reactive protein (CRP) 09/30/2017  . Pneumothorax 09/07/2017  . Chronic throat pain (Primary Area of Pain) 08/12/2017  . Chronic secondary facial pain (Secondary Area of Pain) (Right) 08/12/2017  . Chronic neck pain (Tertiary Area of Pain) 08/12/2017  . Chronic pain syndrome 08/12/2017  . Other long term (current) drug therapy 08/12/2017  . Disorder of skeletal system 08/12/2017  . Other specified health status 08/12/2017  . Long term current use of opiate analgesic 08/12/2017  . Encounter for  antineoplastic immunotherapy 02/27/2017  . Carpal tunnel syndrome 12/04/2016  . Sprain of wrist 12/04/2016  . Pharmacologic therapy 11/20/2016  . Lung cancer ([place]) 11/04/2016  . HCAP (healthcare-associated pneumonia) 09/22/2016  . Sepsis ([place]) 08/04/2015  . CAP (community acquired pneumonia) 08/04/2015  . Acute bronchitis with COPD ([place]) 08/04/2015  . Type 2 diabetes mellitus ([place]) 08/04/2015  . CAD (coronary artery disease) 08/04/2015  . HTN (hypertension) 08/04/2015  . GERD (gastroesophageal reflux disease) 08/04/2015  . Bloating 12/04/2014  . Gas 12/04/2014  . 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 Patient reports over the past several days he has had increasing shortness of breath, dyspnea exertion, wheezing and cough with sputum production. He denies fever however had a low-grade temperature upon arrival to the ER today. Patient was recently discharged from the hospital due to COPD exacerbation along with healthcare acquired pneumonia.    Assessment/Recommendations/Plan:  Recommend palliative to follow outpatient.   Code Status:    Code Status Orders  (From admission, onward)        Start     Ordered   12/02/17 1604  Do not attempt resuscitation (DNR)  Continuous    Question Answer Comment  In the event of cardiac or respiratory ARREST Do not call a "code blue"   In the event of cardiac or respiratory ARREST Do not perform Intubation, CPR, defibrillation or ACLS   In the event of cardiac or respiratory ARREST Use medication by any route, position, wound care, and other measures to relive pain and suffering. May use oxygen, suction and manual treatment of airway  obstruction as needed for comfort.  Comments MOST form in chart.      12/02/17 1603    Code Status History    Date Active Date Inactive Code Status Order ID Comments User Context   12/01/2017 2031 12/02/2017 1603 Full Code [id number]  Bettey Costa, MD Inpatient   11/14/2017 2113 11/16/2017 1401 Full Code [id number]  Lance Coon, MD Inpatient   11/13/2017 1323 11/14/2017 2113 DNR [id number]  Hillary Bow, MD ED   10/10/2017 0246 10/12/2017 1352 DNR [id number]  Hillary Bow, MD ED   09/07/2017 1956 09/08/2017 1612 Full Code [id number]  Vaughan Basta, MD Inpatient   09/23/2016 0048 09/26/2016 2204 Full Code [id number]  Lance Coon, MD Inpatient   08/04/2015 2238 08/05/2015 1528 Full Code [id number]  Lance Coon, MD ED         Discharge Planning:  Home with Palliative Services   Thank you for allowing the Palliative Medicine Team to assist in the care of this patient.   Total Time 15 min Prolonged Time Billed  no      Greater than 50%  of this time was spent counseling and coordinating care related to the above assessment and plan.  Asencion Gowda, NP  Please contact Palliative Medicine Team phone at [phone] for questions and concerns.

## 2017-12-04 LAB — BLOOD GAS, VENOUS
ACID-BASE EXCESS: 11.9 mmol/L — AB (ref 0.0–2.0)
Bicarbonate: 41.7 mmol/L — ABNORMAL HIGH (ref 20.0–28.0)
DELIVERY SYSTEMS: POSITIVE
FIO2: 0.4
Patient temperature: 37
pCO2, Ven: 79 mmHg (ref 44.0–60.0)
pH, Ven: 7.33 (ref 7.250–7.430)

## 2017-12-04 LAB — GLUCOSE, CAPILLARY
Glucose-Capillary: 129 mg/dL — ABNORMAL HIGH (ref 65–99)
Glucose-Capillary: 163 mg/dL — ABNORMAL HIGH (ref 65–99)

## 2017-12-04 MED ORDER — GLUCERNA SHAKE PO LIQD: 237.0000 mL | Freq: Three times a day (TID) | ORAL | 0 refills | Status: AC

## 2017-12-04 MED ORDER — OXYCODONE HCL 10 MG PO TABS: 10.0000 mg | ORAL_TABLET | Freq: Three times a day (TID) | ORAL | 0 refills | Status: DC | PRN

## 2017-12-04 MED ORDER — PREDNISONE 50 MG PO TABS: 50.0000 mg | ORAL_TABLET | Freq: Every day | ORAL | Status: DC

## 2017-12-04 MED ORDER — AZITHROMYCIN 500 MG PO TABS: 500.0000 mg | ORAL_TABLET | Freq: Every day | ORAL | 0 refills | Status: AC

## 2017-12-04 MED ORDER — PREDNISONE 10 MG PO TABS: ORAL_TABLET | ORAL | 0 refills | Status: DC

## 2017-12-04 MED ORDER — CEFDINIR 300 MG PO CAPS: 300.0000 mg | ORAL_CAPSULE | Freq: Two times a day (BID) | ORAL | 0 refills | Status: AC

## 2017-12-04 MED ORDER — GUAIFENESIN-DM 100-10 MG/5ML PO SYRP: 5.0000 mL | ORAL_SOLUTION | ORAL | 0 refills | Status: AC | PRN

## 2017-12-04 MED ORDER — CEFDINIR 300 MG PO CAPS
300.0000 mg | ORAL_CAPSULE | Freq: Two times a day (BID) | ORAL | Status: DC
  Administered 2017-12-04: 12:00:00 300 mg via ORAL
  Filled 2017-12-04 (×2): qty 1

## 2017-12-04 NOTE — Care Management Note (Signed)
Case Management Note  Patient Details  Name: Chris Wilkinson MRN: [id number] Date of Birth: 1940-12-27  Subjective/Objective:    Admitted to [facility] with the diagnosis of COPD. Lives with wife, Stanton Kidney [phone]). Sees Quarry manager at [employer] Prescriptions are filled at [facility]. Home Health is currently being provided by [facility] (SN, PT Aide). No skilled facility.  Home oxygen per [place] Patient. Uses 2 liters continuous since March 2018. Nebulizer in the home. Takes care of all basic activities of daily living himself. No falls. Good appetite. Wife will transport.               Action/Plan: Resumption of Home Health orders. [facility] updated. Palliative Care in the home. Mr. Kepner is in agreement. Doreatha Lew RN representative for Hospice updated.   Expected Discharge Date:  11/16/17               Expected Discharge Plan:     In-House Referral:     Discharge planning Services     Post Acute Care Choice:    Choice offered to:     DME Arranged:    DME Agency:     HH Arranged:    HH Agency:     Status of Service:     If discussed at [employer], dates discussed:    Additional Comments:  Shelbie Ammons, RN MSN CCM Care Management [phone] 12/04/2017, 9:31 AM

## 2017-12-04 NOTE — Care Management Important Message (Signed)
Important Message  Patient Details  Name: Chris Wilkinson MRN: [id number] Date of Birth: 1941-05-31   Medicare Important Message Given:  Yes    [name] 12/04/2017, 9:45 AM

## 2017-12-04 NOTE — Discharge Instructions (Signed)
Pt will follow up at the cancer center on his scheduled appt with Dr Janese Banks and Dr Baruch Gouty  Information on my medicine - ELIQUIS (apixaban)  Why was Eliquis prescribed for you? Eliquis was prescribed for you to reduce the risk of a blood clot forming that can cause a stroke if you have a medical condition called atrial fibrillation (a type of irregular heartbeat).  What do You need to know about Eliquis ? Take your Eliquis TWICE DAILY - one tablet in the morning and one tablet in the evening with or without food. If you have difficulty swallowing the tablet whole please discuss with your pharmacist how to take the medication safely.  Take Eliquis exactly as prescribed by your doctor and DO NOT stop taking Eliquis without talking to the doctor who prescribed the medication.  Stopping may increase your risk of developing a stroke.  Refill your prescription before you run out.  After discharge, you should have regular check-up appointments with your healthcare provider that is prescribing your Eliquis.  In the future your dose may need to be changed if your kidney function or weight changes by a significant amount or as you get older.  What do you do if you miss a dose? If you miss a dose, take it as soon as you remember on the same day and resume taking twice daily.  Do not take more than one dose of ELIQUIS at the same time to make up a missed dose.  Important Safety Information A possible side effect of Eliquis is bleeding. You should call your healthcare provider right away if you experience any of the following: ? Bleeding from an injury or your nose that does not stop. ? Unusual colored urine (red or dark brown) or unusual colored stools (red or black). ? Unusual bruising for unknown reasons. ? A serious fall or if you hit your head (even if there is no bleeding).  Some medicines may interact with Eliquis and might increase your risk of bleeding or clotting while on Eliquis. To  help avoid this, consult your healthcare provider or pharmacist prior to using any new prescription or non-prescription medications, including herbals, vitamins, non-steroidal anti-inflammatory drugs (NSAIDs) and supplements.  This website has more information on Eliquis (apixaban): http://www.eliquis.com/eliquis/home

## 2017-12-04 NOTE — Discharge Summary (Signed)
[facility] NAME: Chris Wilkinson    MR#:  [id number]  DATE OF BIRTH:  May 11, 1941  DATE OF ADMISSION:  12/01/2017 ADMITTING PHYSICIAN: Bettey Costa, MD  DATE OF DISCHARGE: 12/04/2017  PRIMARY CARE PHYSICIAN: Danelle Berry, NP    ADMISSION DIAGNOSIS:  Acute respiratory failure with hypoxia ([place]) [J96.01] COPD with acute exacerbation ([place]) [J44.1]  DISCHARGE DIAGNOSIS:  Acute on chronic hypoxic respiratory failure in the setting of COPD exacerbation with possible pneumonia   SECONDARY DIAGNOSIS:   Past Medical History:  Diagnosis Date  . Cancer ([place])    Lung CA  . CHF (congestive heart failure) ([place])   . COPD (chronic obstructive pulmonary disease) ([place])   . Coronary artery disease   . Diabetes mellitus without complication ([place])   . Fibromyalgia   . GERD (gastroesophageal reflux disease)   . Hypertension   . Pulmonary emboli [facility])     HOSPITAL COURSE:  77 year old male with history of lung cancer, chronic hypoxic respiratory failure on 3 L oxygen due to COPD who presents with shortness of breath and wheezing.  1.Acute on chronic hypoxic respiratory failure in the setting of COPD exacerbation with possible pneumonia (however less likely) -Weaned BiPAP to nasal cannula as tolerated -pt is currently best at [place]. - he has oxygen, nebulizer and inhalers at home per wife  2. Acute on chronic exacerbation of COPD: -Solu-Medrol 60 mg IV every 12 hours and transition to oral prednisone taper -Duo nebs and inhalers  3. Possible HCAP: now on Rocephin and azithromycin --change to oral abxs today  4. History of right side squamous cell lung cancer:  -Patient is followed by radiation oncology as well as oncology. Seen by Dr Mike Gip -pt had recent CT chest that showed worsening in his lung cancer with possible mets to liver and left axillary LN  5. Diabetes: Sliding scale initiated -pt is on tradjenta at   home -A1c is 6.2% in march of 2019 -could be due to high dose of steroids.   6. PAF: Continue amiodarone and Eliquis  7. CAD: Continue Toprol and statin  8. Chronic diastolic heart failure without signs of exacerbation  Discussed with patient and wife. Patient gets home health services through [facility]. He gets physical therapy and RN for three times a week. Patient and wife want to continue the services. He has oxygen and nebulizer at home. Palliative care to follow at home  Patient is at a very high risk for readmission given his chronic medical condition including his ongoing cancer and poor lung condition.  D/c home. Pt and wife agreeable  CONSULTS OBTAINED:  Treatment Team:  Lequita Asal, MD  DRUG ALLERGIES:  No Known Allergies  DISCHARGE MEDICATIONS:   Allergies as of 12/04/2017   No Known Allergies     Medication List    STOP taking these medications   NONFORMULARY OR COMPOUNDED ITEM   predniSONE 10 MG (21) Tbpk tablet Commonly known as:  STERAPRED UNI-PAK 21 TAB Replaced by:  predniSONE 10 MG tablet     TAKE these medications   acetaminophen 325 MG tablet Commonly known as:  TYLENOL Take 2 tablets (650 mg total) by mouth every 6 (six) hours as needed for mild pain (or Fever >/= 101).   amiodarone 200 MG tablet Commonly known as:  PACERONE Take 1 tablet by mouth daily.   apixaban 5 MG Tabs tablet Commonly known as:  ELIQUIS Take 1 tablet (5 mg total) by  mouth 2 (two) times daily.   azithromycin 500 MG tablet Commonly known as:  ZITHROMAX Take 1 tablet (500 mg total) by mouth daily. Start taking on:  12/05/2017   cefdinir 300 MG capsule Commonly known as:  OMNICEF Take 1 capsule (300 mg total) by mouth every 12 (twelve) hours for 5 days.   feeding supplement (GLUCERNA SHAKE) Liqd Take 237 mLs by mouth 3 (three) times daily between meals.   guaiFENesin-dextromethorphan 100-10 MG/5ML syrup Commonly known as:  ROBITUSSIN DM Take 5  mLs by mouth every 4 (four) hours as needed for cough (chest congestion).   ipratropium-albuterol 0.5-2.5 (3) MG/3ML Soln Commonly known as:  DUONEB Take 3 mLs by nebulization every 6 (six) hours as needed.   metoprolol succinate 50 MG 24 hr tablet Commonly known as:  TOPROL-XL Take 50 mg by mouth daily. Take with or immediately following a meal.   mometasone-formoterol 100-5 MCG/ACT Aero Commonly known as:  DULERA Inhale 2 puffs into the lungs 2 times daily at 12 noon and 4 pm.   Oxycodone HCl 10 MG Tabs Take 1 tablet (10 mg total) by mouth every 8 (eight) hours as needed. What changed:  reasons to take this   polyethylene glycol packet Commonly known as:  MIRALAX / GLYCOLAX Take 17 g by mouth daily as needed for mild constipation.   predniSONE 10 MG tablet Commonly known as:  DELTASONE Take 50 mg daily---taper by 10 mg daily then stop Start taking on:  12/05/2017 Replaces:  predniSONE 10 MG (21) Tbpk tablet   primidone 50 MG tablet Commonly known as:  MYSOLINE Take 1 tablet by mouth daily.   simvastatin 10 MG tablet Commonly known as:  ZOCOR Take 10 mg by mouth every evening.   TRADJENTA 5 MG Tabs tablet Generic drug:  linagliptin Take 5 mg by mouth daily.   Vitamin D (Ergocalciferol) 50000 units Caps capsule Commonly known as:  DRISDOL Take 1 capsule by mouth every Thursday.       If you experience worsening of your admission symptoms, develop shortness of breath, life threatening emergency, suicidal or homicidal thoughts you must seek medical attention immediately by calling 911 or calling your MD immediately  if symptoms less severe.  You Must read complete instructions/literature along with all the possible adverse reactions/side effects for all the Medicines you take and that have been prescribed to you. Take any new Medicines after you have completely understood and accept all the possible adverse reactions/side effects.   Please note  You were cared for by  a hospitalist during your hospital stay. If you have any questions about your discharge medications or the care you received while you were in the hospital after you are discharged, you can call the unit and asked to speak with the hospitalist on call if the hospitalist that took care of you is not available. Once you are discharged, your primary care physician will handle any further medical issues. Please note that NO REFILLS for any discharge medications will be authorized once you are discharged, as it is imperative that you return to your primary care physician (or establish a relationship with a primary care physician if you do not have one) for your aftercare needs so that they can reassess your need for medications and monitor your lab values. Today   SUBJECTIVE   chronic cough Wife in the room Ate good breakfast  VITAL SIGNS:  Blood pressure 119/76, pulse 83, temperature 97.7 F (36.5 C), temperature source Oral, resp. rate 18,  height 5\' 11"  (1.803 m), weight 81.7 kg (180 lb 3.2 oz), SpO2 98 %.  I/O:    Intake/Output Summary (Last 24 hours) at 12/04/2017 0911 Last data filed at 12/04/2017 4782 Gross per 24 hour  Intake 1160 ml  Output 975 ml  Net 185 ml    PHYSICAL EXAMINATION:  GENERAL:  77 y.o.-year-old patient lying in the bed with no acute distress. Chronically ill EYES: Pupils equal, round, reactive to light and accommodation. No scleral icterus. Extraocular muscles intact.  HEENT: Head atraumatic, normocephalic. Oropharynx and nasopharynx clear.  NECK:  Supple, no jugular venous distention. No thyroid enlargement, no tenderness.  LUNGS:coarse breath sounds bilaterally, no wheezing, rales,rhonchi or crepitation. No use of accessory muscles of respiration.  CARDIOVASCULAR: S1, S2 normal. No murmurs, rubs, or gallops.  ABDOMEN: Soft, non-tender, non-distended. Bowel sounds present. No organomegaly or mass.  EXTREMITIES: No pedal edema, cyanosis, or clubbing.  NEUROLOGIC:  Cranial nerves II through XII are intact. Muscle strength 5/5 in all extremities. Sensation intact. Gait not checked.  PSYCHIATRIC: The patient is alert and oriented x 3.  SKIN: No obvious rash, lesion, or ulcer.   DATA REVIEW:   CBC  Recent Labs  Lab 12/03/17 0620  WBC 13.5*  HGB 11.8*  HCT 36.6*  PLT 128*    Chemistries  Recent Labs  Lab 12/01/17 1924  12/03/17 0620  NA 140   < > 142  K 3.3*   < > 4.6  CL 95*   < > 101  CO2 32   < > 33*  GLUCOSE 197*   < > 143*  BUN 22*   < > 28*  CREATININE 1.45*   < > 1.21  CALCIUM 10.0   < > 9.9  AST 21  --   --   ALT 18  --   --   ALKPHOS 93  --   --   BILITOT 1.0  --   --    < > = values in this interval not displayed.    Microbiology Results   Recent Results (from the past 240 hour(s))  Urine Culture     Status: None   Collection Time: 12/01/17 12:15 AM  Result Value Ref Range Status   Specimen Description   Final    URINE, RANDOM Performed at [facility], [address] [place], [place] [address]    Special Requests   Final    NONE Performed at [facility], [address] [place], [place] [address]    Culture   Final    NO GROWTH Performed at [facility], [place] [address], [place], [place] [address]    Report Status 12/03/2017 FINAL  Final  Blood Culture (routine x 2)     Status: None (Preliminary result)   Collection Time: 12/01/17  4:56 PM  Result Value Ref Range Status   Specimen Description BLOOD BLOOD RIGHT HAND  Final   Special Requests   Final    BOTTLES DRAWN AEROBIC AND ANAEROBIC Blood Culture results may not be optimal due to an inadequate volume of blood received in culture bottles   Culture   Final    NO GROWTH 3 DAYS Performed at [facility], [address], [place], [place] [address]    Report Status PENDING  Incomplete  Blood Culture (routine x 2)     Status: None (Preliminary result)   Collection Time: 12/01/17  4:56 PM  Result Value Ref Range Status    Specimen Description  BLOOD BLOOD LEFT HAND  Final   Special Requests   Final    BOTTLES DRAWN AEROBIC AND ANAEROBIC Blood Culture results may not be optimal due to an inadequate volume of blood received in culture bottles   Culture   Final    NO GROWTH 3 DAYS Performed at [facility], [address] [place], [place] [address]    Report Status PENDING  Incomplete  MRSA PCR Screening     Status: None   Collection Time: 12/01/17  8:32 PM  Result Value Ref Range Status   MRSA by PCR NEGATIVE NEGATIVE Final    Comment:        The GeneXpert MRSA Assay (FDA approved for NASAL specimens only), is one component of a comprehensive MRSA colonization surveillance program. It is not intended to diagnose MRSA infection nor to guide or monitor treatment for MRSA infections. Performed at [facility], [address], [place], [place] [address]     RADIOLOGY:  No results found.   Management plans discussed with the patient, family and they are in agreement.  CODE STATUS:     Code Status Orders  (From admission, onward)        Start     Ordered   12/02/17 1604  Do not attempt resuscitation (DNR)  Continuous    Question Answer Comment  In the event of cardiac or respiratory ARREST Do not call a "code blue"   In the event of cardiac or respiratory ARREST Do not perform Intubation, CPR, defibrillation or ACLS   In the event of cardiac or respiratory ARREST Use medication by any route, position, wound care, and other measures to relive pain and suffering. May use oxygen, suction and manual treatment of airway obstruction as needed for comfort.   Comments MOST form in chart.      12/02/17 1603    Code Status History    Date Active Date Inactive Code Status Order ID Comments User Context   12/01/2017 2031 12/02/2017 1603 Full Code [id number]  Bettey Costa, MD Inpatient   11/14/2017 2113 11/16/2017 1401 Full Code [id number]  Lance Coon, MD Inpatient   11/13/2017 1323  11/14/2017 2113 DNR [id number]  Hillary Bow, MD ED   10/10/2017 0246 10/12/2017 1352 DNR [id number]  Hillary Bow, MD ED   09/07/2017 1956 09/08/2017 1612 Full Code [id number]  Vaughan Basta, MD Inpatient   09/23/2016 0048 09/26/2016 2204 Full Code [id number]  Lance Coon, MD Inpatient   08/04/2015 2238 08/05/2015 1528 Full Code [id number]  Lance Coon, MD ED      TOTAL TIME TAKING CARE OF THIS PATIENT: 40 minutes.    Fritzi Mandes M.D on 12/04/2017 at 9:11 AM  Between 7am to 6pm - Pager - [phone] After 6pm go to [email or web address] - password [email or web address] [facility]  Office  [phone]  CC: Primary care physician; Danelle Berry, NP

## 2017-12-04 NOTE — Care Management Important Message (Signed)
Important Message  Patient Details  Name: Chris Wilkinson MRN: [id number] Date of Birth: [birth year]   Medicare Important Message Given:  Yes    Juliann Pulse A Judeen Geralds 12/04/2017, 10:37 AM

## 2017-12-04 NOTE — Plan of Care (Signed)
  Problem: Education: Goal: Knowledge of General Education information will improve Outcome: Progressing   Problem: Health Behavior/Discharge Planning: Goal: Ability to manage health-related needs will improve Outcome: Progressing   Problem: Clinical Measurements: Goal: Ability to maintain clinical measurements within normal limits will improve Outcome: Progressing Goal: Will remain free from infection Outcome: Progressing Goal: Diagnostic test results will improve Outcome: Progressing Goal: Respiratory complications will improve Outcome: Progressing Goal: Cardiovascular complication will be avoided Outcome: Progressing   Problem: Activity: Goal: Risk for activity intolerance will decrease Outcome: Progressing   Problem: Nutrition: Goal: Adequate nutrition will be maintained Outcome: Progressing   Problem: Elimination: Goal: Will not experience complications related to bowel motility Outcome: Progressing Goal: Will not experience complications related to urinary retention Outcome: Progressing   Problem: Safety: Goal: Ability to remain free from injury will improve Outcome: Progressing   Problem: Skin Integrity: Goal: Risk for impaired skin integrity will decrease Outcome: Progressing   Problem: Education: Goal: Knowledge of disease or condition will improve Outcome: Progressing Goal: Knowledge of the prescribed therapeutic regimen will improve Outcome: Progressing   Problem: Activity: Goal: Ability to tolerate increased activity will improve Outcome: Progressing Goal: Will verbalize the importance of balancing activity with adequate rest periods Outcome: Progressing   Problem: Respiratory: Goal: Ability to maintain a clear airway will improve Outcome: Progressing Goal: Levels of oxygenation will improve Outcome: Progressing Goal: Ability to maintain adequate ventilation will improve Outcome: Progressing

## 2017-12-06 LAB — CULTURE, BLOOD (ROUTINE X 2)
CULTURE: NO GROWTH
Culture: NO GROWTH

## 2017-12-09 ENCOUNTER — Ambulatory Visit: Payer: Medicare Other

## 2017-12-09 ENCOUNTER — Other Ambulatory Visit: Payer: Medicare Other

## 2017-12-11 ENCOUNTER — Telehealth: Payer: Self-pay | Admitting: *Deleted

## 2017-12-11 ENCOUNTER — Inpatient Hospital Stay: Payer: Medicare Other | Attending: Oncology

## 2017-12-11 ENCOUNTER — Ambulatory Visit: Payer: Medicare Other

## 2017-12-11 ENCOUNTER — Ambulatory Visit: Admission: RE | Admit: 2017-12-11 | Payer: Medicare Other | Source: Ambulatory Visit

## 2017-12-11 NOTE — Telephone Encounter (Signed)
Reading discharge summary, I noted patient has [facility] going in to his home. Could we ask that they flush his port? Doe she Need it flushed if he was just discharged?

## 2017-12-11 NOTE — Telephone Encounter (Signed)
I called cell phone and got voicemail then I tried home phone and got a message but memory is full and can't leave a message.  I wanted to see from wife or pt if they went home with palliative care services. I saw previous notes that pt does not want more scans or more treatment but just wants to know what options does he have. hayley had a note in that she spoke to them about hospice vs. Palliative care.  His last hospital stay indicated he was going home with palliative care and I wanted to confirm that so that I can see if they can flush port and to let pt know that it is not going to hurt him to not get port flushed right on the day we made appt.  We can help work something else.  I will try to call again on Monday to see if I can get someone on the phone to speak to.

## 2017-12-11 NOTE — Telephone Encounter (Signed)
Wife called reporting that patient was too weak to come in for his port flush this morning at 9, she is asking if there is someone who can come to their home to flush it for him. When checking the schedule, he was also scheduled for lab and CT today and he did not show up for that either

## 2017-12-18 ENCOUNTER — Inpatient Hospital Stay: Payer: Medicare Other | Admitting: Oncology

## 2017-12-18 ENCOUNTER — Ambulatory Visit: Payer: Medicare Other | Attending: Radiation Oncology | Admitting: Radiation Oncology

## 2017-12-21 ENCOUNTER — Ambulatory Visit: Payer: Self-pay | Admitting: Internal Medicine

## 2017-12-31 ENCOUNTER — Ambulatory Visit: Payer: Medicare Other | Admitting: Radiation Oncology

## 2018-01-01 ENCOUNTER — Telehealth: Payer: Self-pay | Admitting: *Deleted

## 2018-01-01 MED ORDER — PREDNISONE 10 MG PO TABS: ORAL_TABLET | ORAL | 0 refills | Status: AC

## 2018-01-01 NOTE — Telephone Encounter (Signed)
Spoke with patient and his wife and informed them both that Dr. Janese Banks is willing to refill prednisone this one time only but will need to be evaluated in the clinic for future refills on any other medications. Pt informed that he will most likely continue to decline without treatment and will need to start thinking about hospice care if does not get better. Pt states that wants to get better and is not ready for hospice. Pt informed that will make appt for follow up with Dr. Janese Banks on 7/9 at 10:45am for to discuss treatment vs hospice care with Dr. Janese Banks. Pt encouraged to keep appt or let us know if he decides to pursue hospice. Pt verbalized understanding. Appt reminder has been mailed to patient as well.

## 2018-01-01 NOTE — Telephone Encounter (Signed)
I have asked hayley to call him and explain

## 2018-01-01 NOTE — Telephone Encounter (Signed)
Wife called and states patient needs refill of steroids given to him in hospital. Patient has not been for follow up appointment and has no future appts scheduled. Wife insists patient is too weak and short of breath to come in to see doctor and that all he does is sit in a chair all day. Please advise if you are willing to refill steroids. I tried to explain to wife that he needs to be seen and evaluated. She was adamant that he cannot come.

## 2018-01-15 ENCOUNTER — Ambulatory Visit: Payer: Medicare Other | Admitting: Oncology

## 2018-01-17 IMAGING — CR DG CHEST 2V
2 series · 2 of 2 positions shown · non-contrast
Comparison: PET-CT, 10/10/2016

CLINICAL DATA: Sore throat and headache since [REDACTED]. Currently
receiving Chemo and radiation for lung cancer. Former smoker. Hx of
COPD, CHF,diabetes and HTN

EXAM:
CHEST  2 VIEW

[chest pa]
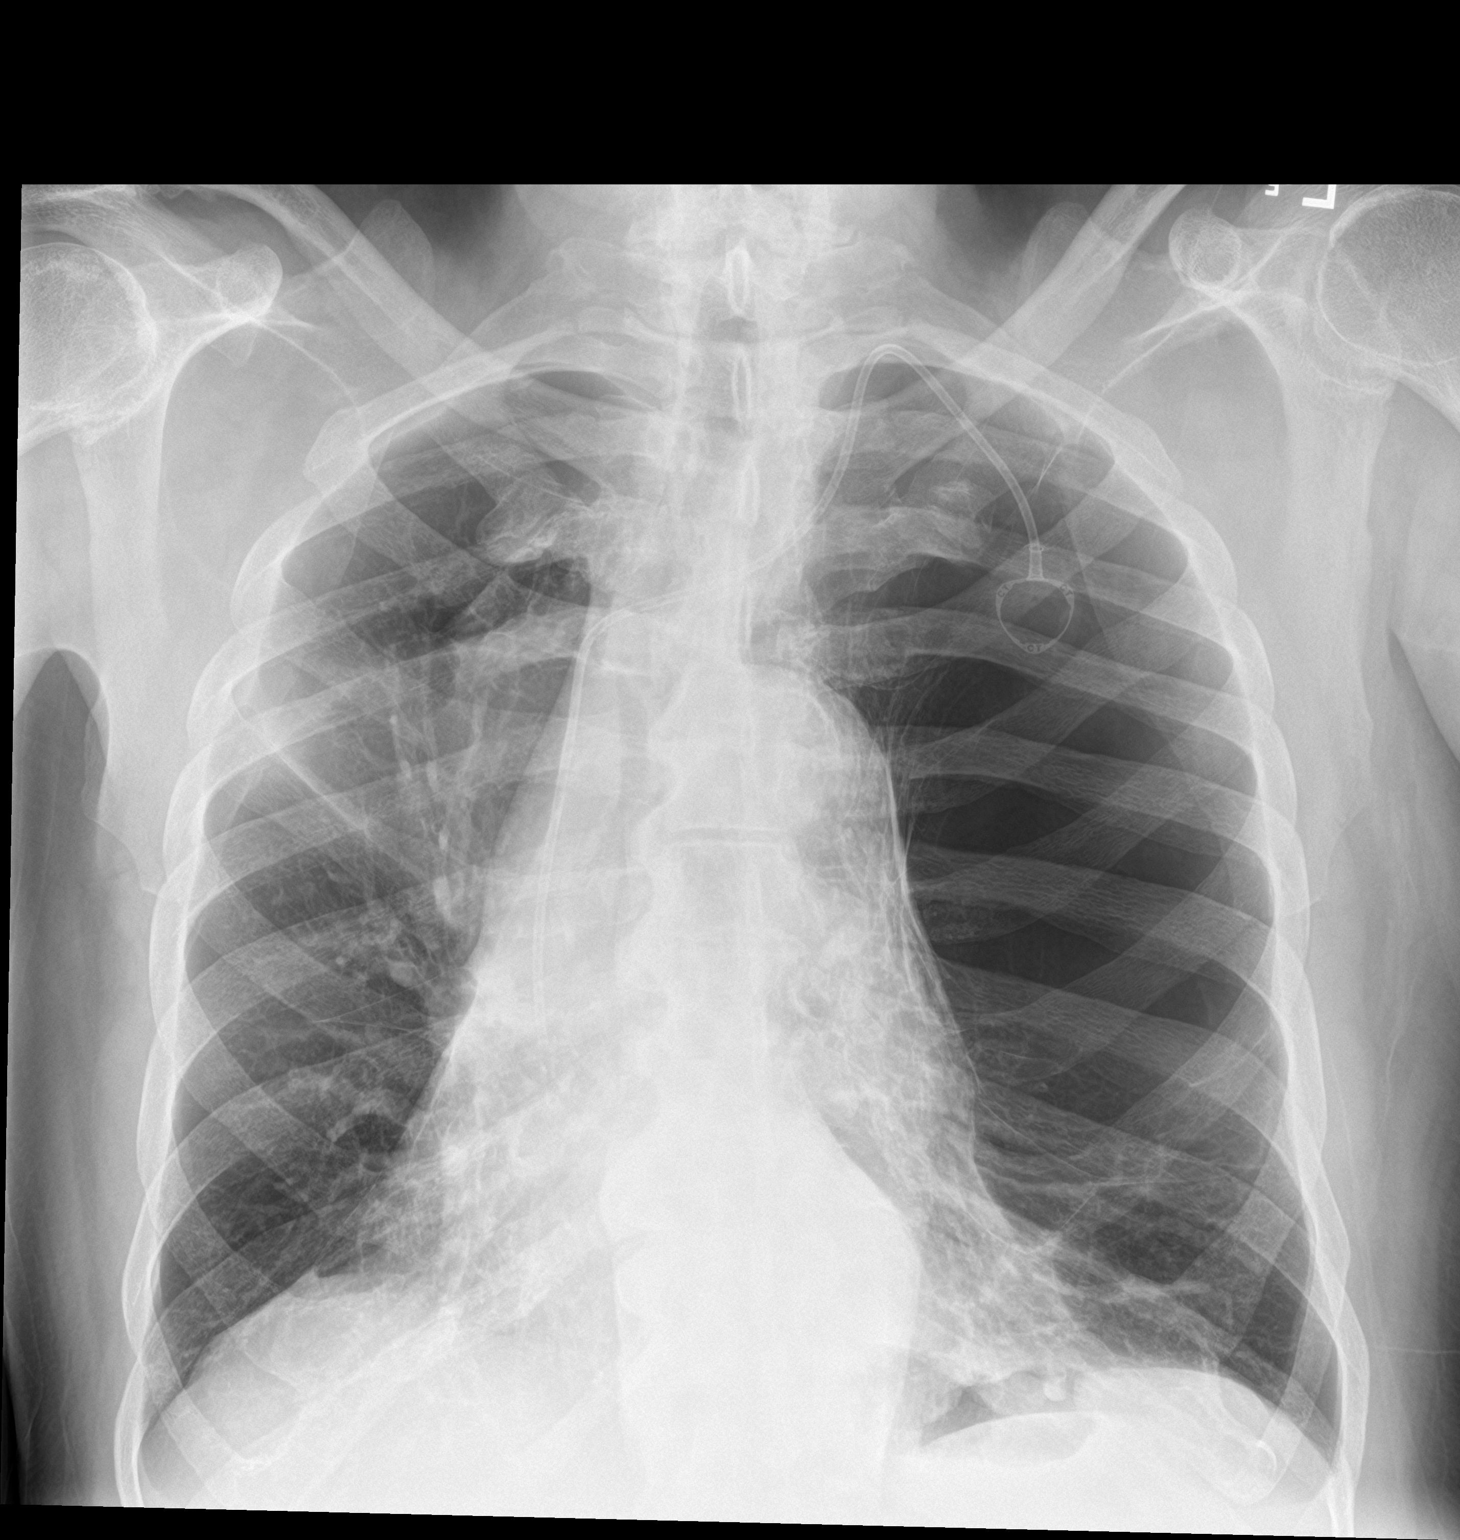

[chest lat]
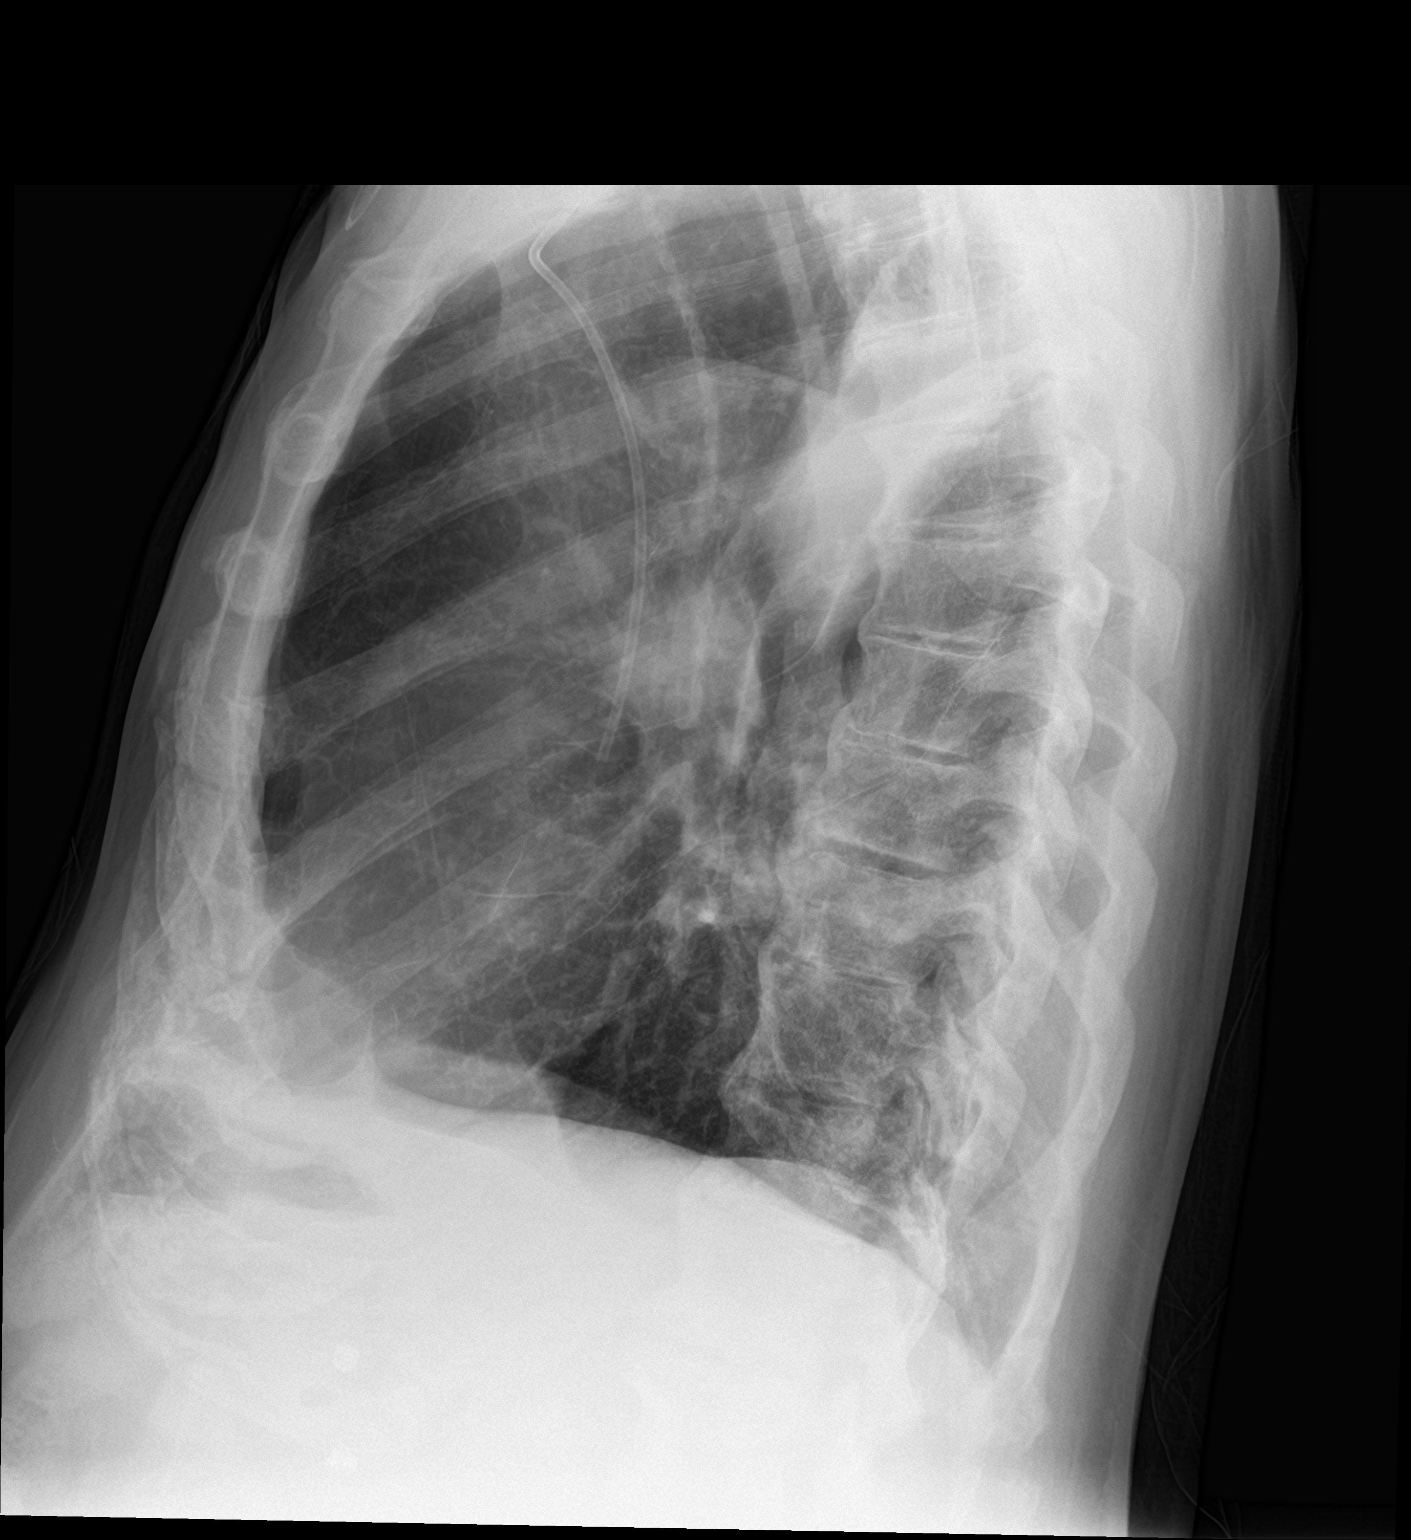

[2 of 2 positions shown; findings below may reference images not displayed]

FINDINGS: Cardiac silhouette is normal in size. Mild thickening along the
right peritracheal stripe consistent with known adenopathy.

There is hazy opacity extending superiorly from the right hilum
consistent with the right posterior upper lobe consolidation/
collapse noted on the prior PET-CT. This appears less extensive,
being somewhat less dense than on the chest radiograph from
09/25/2016. There is somewhat less volume loss on the right as well.

Large areas of emphysema in the left lung lead to compression of the
bronchovascular structures medially and inferiorly, stable. There is
no evidence of pneumonia. No pulmonary edema. No pleural effusion
and no evidence of a pneumothorax.

Left anterior chest wall Port-A-Cath is stable.
IMPRESSION: 1. No acute cardiopulmonary disease.
2. There has been some improvement with the right upper lobe
consolidation/postobstructive change decreased in density from the
prior studies, along with less volume loss noted on the right. No
other change.

## 2018-01-17 IMAGING — CR DG NECK SOFT TISSUE
1 series · 2 of 2 positions shown · non-contrast
Comparison: None.

CLINICAL DATA: Swelling in side of throat. Cancer patient
undergoing chemotherapy.

EXAM:
NECK SOFT TISSUES - 1+ VIEW

[Series 1: dg neck soft tissue · 0.14mm/px · 2 of 2 slices shown]
[im 1/2]
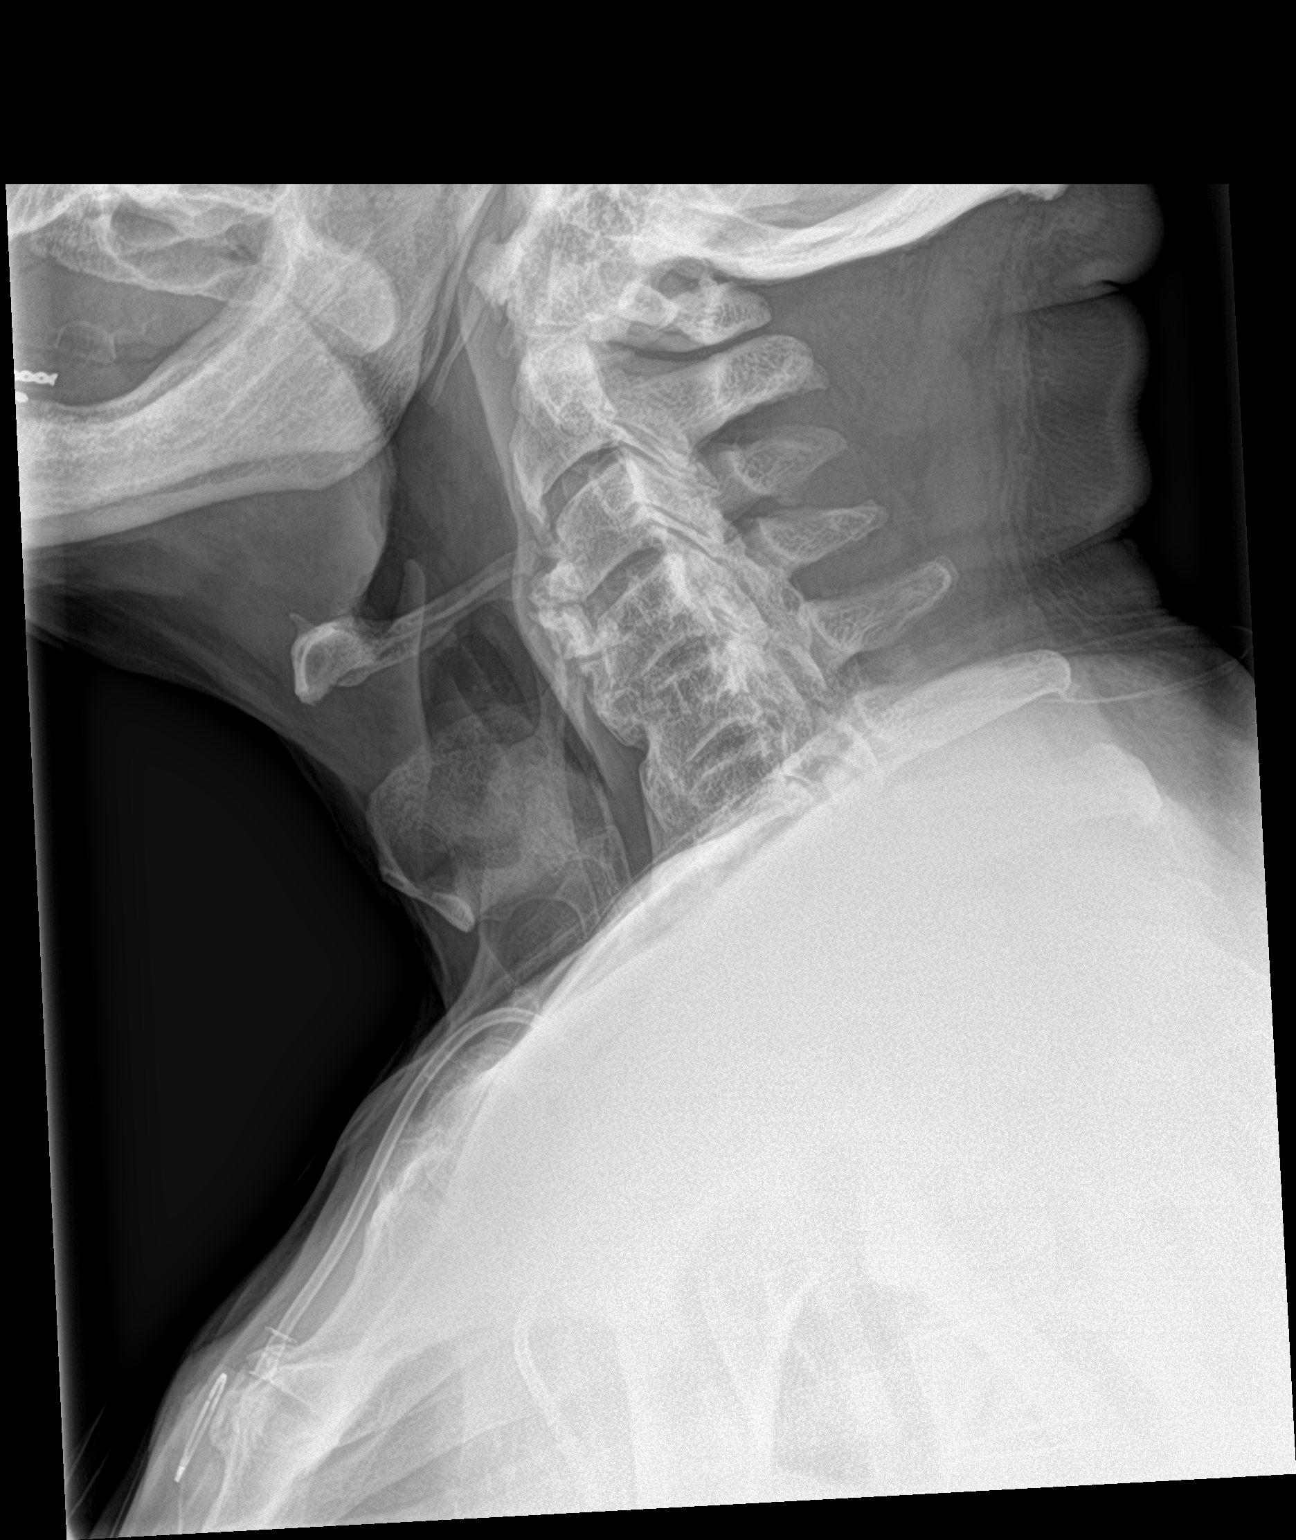
[im 2/2]
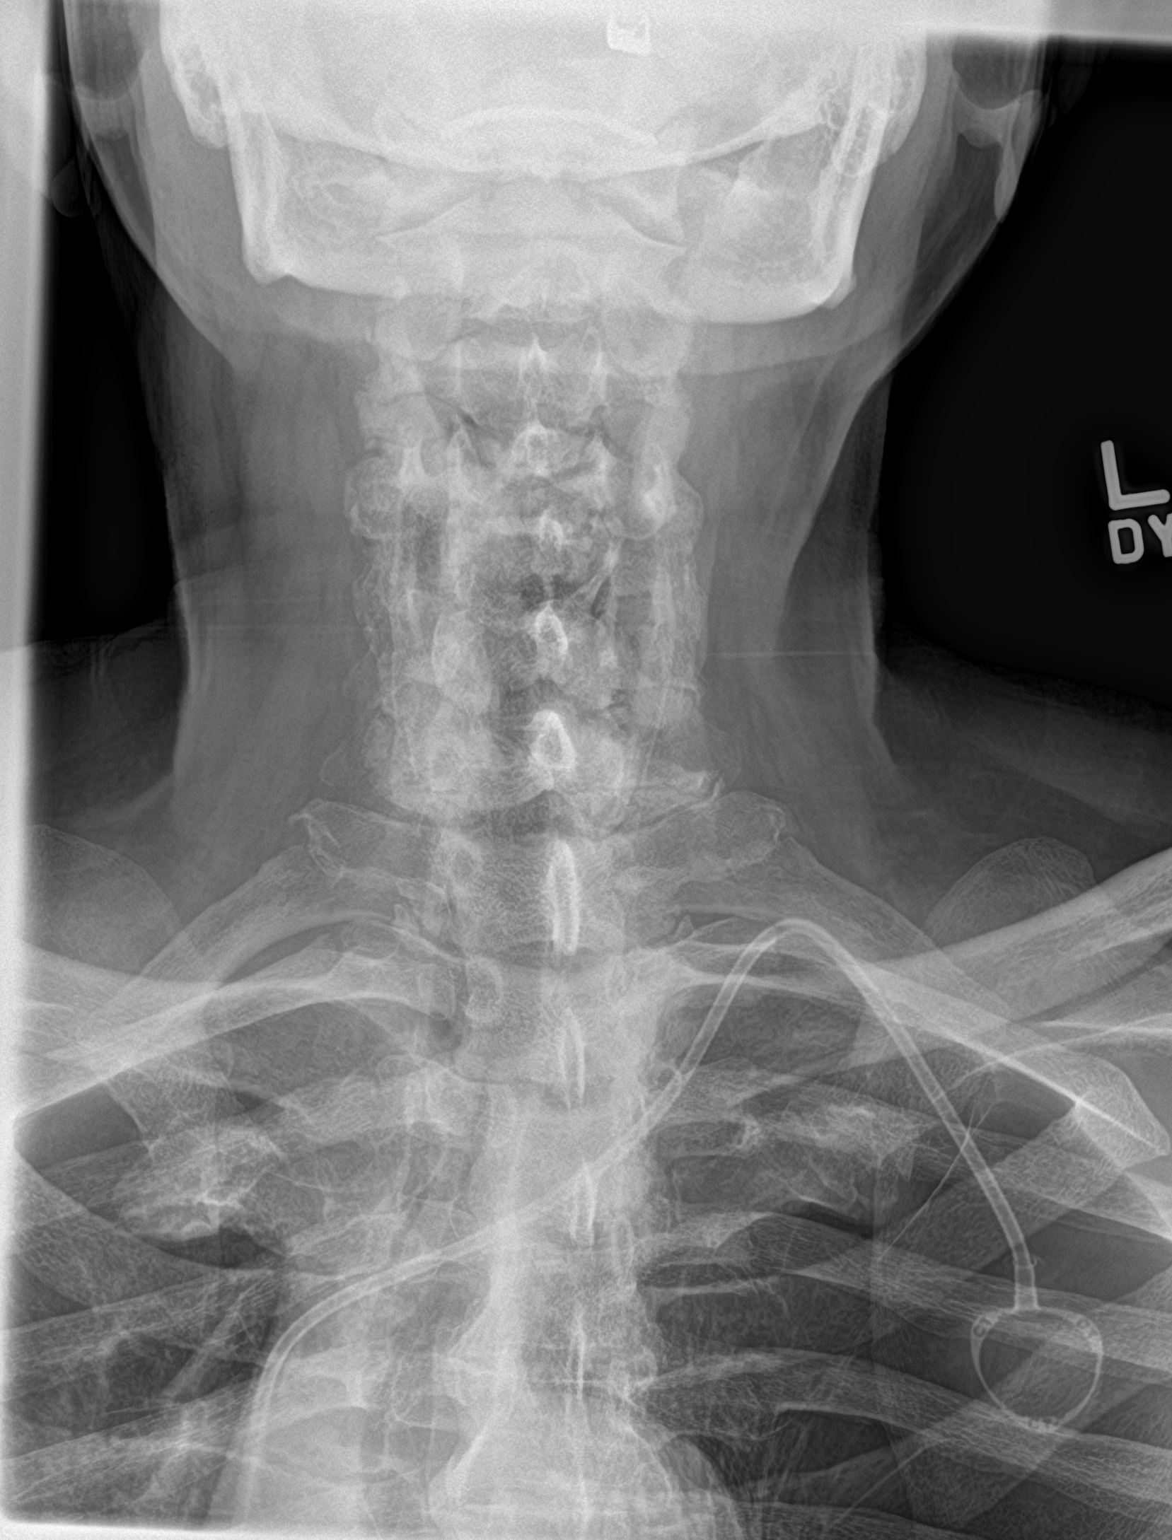

[2 of 2 positions shown; findings below may reference images not displayed]

FINDINGS: The prevertebral soft tissues are normal in appearance. The
epiglottis, valleculae, and piriform sinuses are normal in
appearance as well. No abnormalities in the soft tissues of the
upper neck are identified. Severe degenerative changes seen in the
cervical spine. A left-sided Port-A-Cath is identified. Bullous
emphysematous changes are seen in the upper left lung apex.
IMPRESSION: No acute abnormalities identified.

## 2018-01-19 ENCOUNTER — Inpatient Hospital Stay: Payer: Medicare Other | Attending: Oncology | Admitting: Oncology

## 2018-01-29 ENCOUNTER — Telehealth: Payer: Self-pay | Admitting: *Deleted

## 2018-01-29 NOTE — Telephone Encounter (Signed)
Please see above. Reiterate hospice referral. Port flushes are not of prime importance. Not sure if home health can do it.

## 2018-01-29 NOTE — Telephone Encounter (Signed)
I called the cell phone for pt. And got voicemail and I know in the past that they do not check it.  I called the home phone and it is full with memory and can't leave message. Will try calling on monday

## 2018-01-29 NOTE — Telephone Encounter (Signed)
Patients wife called to report that he was too weak to come to his radiation appointments and she is concerned about his port flush. According the appointment schedule is last flush was in May of this year. She is requesting home health to come to the home to flush it.

## 2018-02-01 ENCOUNTER — Other Ambulatory Visit: Payer: Self-pay | Admitting: Nurse Practitioner

## 2018-02-01 DIAGNOSIS — R0602 Shortness of breath: Secondary | ICD-10-CM

## 2018-02-01 DIAGNOSIS — R05 Cough: Secondary | ICD-10-CM

## 2018-02-01 DIAGNOSIS — R059 Cough, unspecified: Secondary | ICD-10-CM

## 2018-02-01 MED ORDER — IPRATROPIUM-ALBUTEROL 0.5-2.5 (3) MG/3ML IN SOLN: 3.0000 mL | Freq: Four times a day (QID) | RESPIRATORY_TRACT | 3 refills | Status: AC | PRN

## 2018-02-02 ENCOUNTER — Telehealth: Payer: Self-pay | Admitting: *Deleted

## 2018-02-02 NOTE — Telephone Encounter (Signed)
Lonn [place] called from home health agency and was concerned about pt and wanted to check and see about f/u appts for him.  I told her that we have had several calls about him and port flush. We offered him to come in and get it flushed but they said he was too weak and bad to come in. We offered hospice and they did not want it. They have in home health and don't want it to change. They have been offered anytime they can make it to call us and we can get an appt.  kayla states that a new provider saw him 2 weeks ago and gave him oxycodone and in little over week and wanted more. She have some questions about if he is warranted of needing pills. I did tell her that he does have progressive cancer and that Is why we offered hospice and he refused.  I asked if the home health agency can flush ports and she thinks so but will check and let me know.

## 2018-03-23 ENCOUNTER — Other Ambulatory Visit: Payer: Self-pay

## 2018-03-23 ENCOUNTER — Telehealth: Payer: Self-pay | Admitting: *Deleted

## 2018-03-23 DIAGNOSIS — J449 Chronic obstructive pulmonary disease, unspecified: Secondary | ICD-10-CM

## 2018-03-23 MED ORDER — MOMETASONE FURO-FORMOTEROL FUM 100-5 MCG/ACT IN AERO: 2.0000 | INHALATION_SPRAY | Freq: Two times a day (BID) | RESPIRATORY_TRACT | 4 refills | Status: AC

## 2018-03-23 NOTE — Telephone Encounter (Signed)
Pt's wife called in to state that patient is no longer receiving home health services and is asking for recommendations from Dr. Janese Banks. Informed pt's wife that only options are for patient to come in to be seen or can refer to hospice services. Spoke with patient and he stated that he is too weak to come in for a visit. Educated patient that without treatment his cancer will continue to progress and he will continue to get weaker. Informed pt that hospice would be an appropriate next step to take if does not want to pursue treatment anymore. Pt agreed with plan. Hospice orders signed by Dr. Janese Banks and faxed. Pt informed to expect phone call from hospice to coordinate initial admission visit. Pt and wife verbalized understanding.

## 2018-03-26 ENCOUNTER — Other Ambulatory Visit: Payer: Self-pay | Admitting: *Deleted

## 2018-03-26 MED ORDER — OXYCODONE HCL 10 MG PO TABS: 10.0000 mg | ORAL_TABLET | Freq: Four times a day (QID) | ORAL | 0 refills | Status: DC | PRN

## 2018-03-26 NOTE — Telephone Encounter (Signed)
Hospice requests Oxycodone refill.

## 2018-04-09 ENCOUNTER — Other Ambulatory Visit: Payer: Self-pay | Admitting: *Deleted

## 2018-04-09 MED ORDER — OXYCODONE HCL 10 MG PO TABS: 10.0000 mg | ORAL_TABLET | Freq: Four times a day (QID) | ORAL | 0 refills | Status: DC | PRN

## 2018-04-09 NOTE — Telephone Encounter (Signed)
Nurse also requesting medicine for sharpe pains in the back of his throat from radiation therapy . Please advise

## 2018-04-15 ENCOUNTER — Other Ambulatory Visit: Payer: Self-pay | Admitting: *Deleted

## 2018-04-15 MED ORDER — OXYCODONE HCL 30 MG PO TABS: 30.0000 mg | ORAL_TABLET | ORAL | 0 refills | Status: AC | PRN

## 2018-04-15 MED ORDER — MORPHINE SULFATE ER 15 MG PO TBCR: 15.0000 mg | EXTENDED_RELEASE_TABLET | Freq: Two times a day (BID) | ORAL | 0 refills | Status: AC

## 2018-04-15 NOTE — Telephone Encounter (Signed)
Chris Wilkinson informed of medicine changes

## 2018-04-15 NOTE — Telephone Encounter (Signed)
We should add long acting pain medication oxycontin 10 mg bid or morphine 15 mg bid whaa is covered by hospice. Increase oxycodone short acting to 30 mg Q4 prn.

## 2018-04-15 NOTE — Telephone Encounter (Signed)
Hospice nurse called to [place] that patient is taking more of his Oxycodone than he is suppose to be taking due to a sharpe cutting pain in his throat. She reports that he states it takes 4 Oxycodone 10 mg tabs to get comfortable. He is not on a long acting pain medicine. She states he got # 60 Oxycodone 10 mg on 9/27 and he only has 4 tabs left. Requesting an increase in his pain medications. Please advise

## 2018-05-14 DEATH — deceased
